# Patient Record
Sex: Male | Born: 1950
Health system: Southern US, Community
[De-identification: ages and names within clinical notes are randomized; demographics above are authoritative.]

## PROBLEM LIST (undated history)

## (undated) DIAGNOSIS — G5600 Carpal tunnel syndrome, unspecified upper limb: Secondary | ICD-10-CM

## (undated) DIAGNOSIS — K449 Diaphragmatic hernia without obstruction or gangrene: Secondary | ICD-10-CM

## (undated) DIAGNOSIS — N390 Urinary tract infection, site not specified: Secondary | ICD-10-CM

## (undated) DIAGNOSIS — I10 Essential (primary) hypertension: Secondary | ICD-10-CM

## (undated) DIAGNOSIS — Z5189 Encounter for other specified aftercare: Secondary | ICD-10-CM

## (undated) DIAGNOSIS — Z466 Encounter for fitting and adjustment of urinary device: Secondary | ICD-10-CM

## (undated) DIAGNOSIS — G839 Paralytic syndrome, unspecified: Secondary | ICD-10-CM

## (undated) DIAGNOSIS — I739 Peripheral vascular disease, unspecified: Secondary | ICD-10-CM

## (undated) DIAGNOSIS — G4730 Sleep apnea, unspecified: Secondary | ICD-10-CM

## (undated) DIAGNOSIS — L8990 Pressure ulcer of unspecified site, unspecified stage: Secondary | ICD-10-CM

## (undated) DIAGNOSIS — IMO0001 Reserved for inherently not codable concepts without codable children: Secondary | ICD-10-CM

## (undated) DIAGNOSIS — R112 Nausea with vomiting, unspecified: Secondary | ICD-10-CM

## (undated) DIAGNOSIS — M1990 Unspecified osteoarthritis, unspecified site: Secondary | ICD-10-CM

## (undated) DIAGNOSIS — K219 Gastro-esophageal reflux disease without esophagitis: Secondary | ICD-10-CM

## (undated) DIAGNOSIS — Z9889 Other specified postprocedural states: Secondary | ICD-10-CM

## (undated) DIAGNOSIS — T300 Burn of unspecified body region, unspecified degree: Secondary | ICD-10-CM

## (undated) DIAGNOSIS — Z87442 Personal history of urinary calculi: Secondary | ICD-10-CM

## (undated) DIAGNOSIS — K5790 Diverticulosis of intestine, part unspecified, without perforation or abscess without bleeding: Secondary | ICD-10-CM

## (undated) DIAGNOSIS — E785 Hyperlipidemia, unspecified: Secondary | ICD-10-CM

## (undated) DIAGNOSIS — K222 Esophageal obstruction: Secondary | ICD-10-CM

## (undated) DIAGNOSIS — G5603 Carpal tunnel syndrome, bilateral upper limbs: Secondary | ICD-10-CM

## (undated) DIAGNOSIS — I2699 Other pulmonary embolism without acute cor pulmonale: Secondary | ICD-10-CM

## (undated) DIAGNOSIS — F17200 Nicotine dependence, unspecified, uncomplicated: Secondary | ICD-10-CM

## (undated) HISTORY — DX: Paralytic syndrome, unspecified: G83.9

## (undated) HISTORY — DX: Pressure ulcer of unspecified site, unspecified stage: L89.90

## (undated) HISTORY — DX: Nicotine dependence, unspecified, uncomplicated: F17.200

## (undated) HISTORY — DX: Diaphragmatic hernia without obstruction or gangrene: K44.9

## (undated) HISTORY — DX: Esophageal obstruction: K22.2

## (undated) HISTORY — DX: Gastro-esophageal reflux disease without esophagitis: K21.9

## (undated) HISTORY — DX: Burn of unspecified body region, unspecified degree: T30.0

## (undated) HISTORY — DX: Hyperlipidemia, unspecified: E78.5

## (undated) HISTORY — DX: Peripheral vascular disease, unspecified: I73.9

## (undated) HISTORY — DX: Diverticulosis of intestine, part unspecified, without perforation or abscess without bleeding: K57.90

## (undated) HISTORY — DX: Carpal tunnel syndrome, unspecified upper limb: G56.00

## (undated) HISTORY — PX: MULTIPLE TOOTH EXTRACTIONS: SHX2053

## (undated) HISTORY — DX: Essential (primary) hypertension: I10

## (undated) HISTORY — PX: OTHER SURGICAL HISTORY: SHX169

## (undated) SURGERY — EGD (ESOPHAGOGASTRODUODENOSCOPY)
Anesthesia: Moderate Sedation

---
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
## 1998-04-25 ENCOUNTER — Encounter: Admission: RE | Admit: 1998-04-25 | Discharge: 1998-05-10 | Payer: Self-pay | Admitting: Family Medicine

## 1998-05-11 ENCOUNTER — Ambulatory Visit (HOSPITAL_COMMUNITY): Admission: RE | Admit: 1998-05-11 | Discharge: 1998-05-11 | Payer: Self-pay | Admitting: Orthopedic Surgery

## 2001-02-01 ENCOUNTER — Encounter: Payer: Self-pay | Admitting: Surgery

## 2001-02-01 ENCOUNTER — Encounter: Admission: RE | Admit: 2001-02-01 | Discharge: 2001-02-01 | Payer: Self-pay | Admitting: Surgery

## 2001-02-03 ENCOUNTER — Ambulatory Visit (HOSPITAL_BASED_OUTPATIENT_CLINIC_OR_DEPARTMENT_OTHER): Admission: RE | Admit: 2001-02-03 | Discharge: 2001-02-04 | Payer: Self-pay | Admitting: Surgery

## 2001-02-03 ENCOUNTER — Encounter (INDEPENDENT_AMBULATORY_CARE_PROVIDER_SITE_OTHER): Payer: Self-pay | Admitting: Specialist

## 2001-02-03 HISTORY — PX: HERNIA REPAIR: SHX51

## 2002-05-31 ENCOUNTER — Encounter: Admission: RE | Admit: 2002-05-31 | Discharge: 2002-05-31 | Payer: Self-pay | Admitting: Family Medicine

## 2002-05-31 ENCOUNTER — Encounter: Payer: Self-pay | Admitting: Family Medicine

## 2003-02-18 HISTORY — PX: OTHER SURGICAL HISTORY: SHX169

## 2003-05-13 ENCOUNTER — Inpatient Hospital Stay (HOSPITAL_COMMUNITY): Admission: EM | Admit: 2003-05-13 | Discharge: 2003-05-18 | Payer: Self-pay | Admitting: Emergency Medicine

## 2005-01-01 ENCOUNTER — Encounter (HOSPITAL_BASED_OUTPATIENT_CLINIC_OR_DEPARTMENT_OTHER): Admission: RE | Admit: 2005-01-01 | Discharge: 2005-04-01 | Payer: Self-pay | Admitting: Surgery

## 2005-04-02 ENCOUNTER — Encounter (HOSPITAL_BASED_OUTPATIENT_CLINIC_OR_DEPARTMENT_OTHER): Admission: RE | Admit: 2005-04-02 | Discharge: 2005-04-17 | Payer: Self-pay | Admitting: Surgery

## 2006-02-17 DIAGNOSIS — K5790 Diverticulosis of intestine, part unspecified, without perforation or abscess without bleeding: Secondary | ICD-10-CM

## 2006-02-17 HISTORY — PX: OTHER SURGICAL HISTORY: SHX169

## 2006-02-17 HISTORY — DX: Diverticulosis of intestine, part unspecified, without perforation or abscess without bleeding: K57.90

## 2006-02-20 ENCOUNTER — Ambulatory Visit: Payer: Self-pay | Admitting: Internal Medicine

## 2006-02-27 ENCOUNTER — Ambulatory Visit (HOSPITAL_COMMUNITY): Admission: RE | Admit: 2006-02-27 | Discharge: 2006-02-27 | Payer: Self-pay | Admitting: Internal Medicine

## 2006-02-27 ENCOUNTER — Ambulatory Visit: Payer: Self-pay | Admitting: Internal Medicine

## 2008-04-28 ENCOUNTER — Emergency Department (HOSPITAL_COMMUNITY): Admission: EM | Admit: 2008-04-28 | Discharge: 2008-04-29 | Payer: Self-pay | Admitting: Emergency Medicine

## 2008-04-30 ENCOUNTER — Emergency Department (HOSPITAL_COMMUNITY): Admission: EM | Admit: 2008-04-30 | Discharge: 2008-04-30 | Payer: Self-pay | Admitting: Emergency Medicine

## 2008-05-02 ENCOUNTER — Encounter (HOSPITAL_COMMUNITY): Admission: RE | Admit: 2008-05-02 | Discharge: 2008-06-01 | Payer: Self-pay | Admitting: Emergency Medicine

## 2008-06-06 ENCOUNTER — Encounter (HOSPITAL_COMMUNITY): Admission: RE | Admit: 2008-06-06 | Discharge: 2008-07-11 | Payer: Self-pay | Admitting: Emergency Medicine

## 2008-07-14 ENCOUNTER — Encounter (HOSPITAL_COMMUNITY): Admission: RE | Admit: 2008-07-14 | Discharge: 2008-08-16 | Payer: Self-pay | Admitting: Family Medicine

## 2008-08-17 ENCOUNTER — Encounter (HOSPITAL_COMMUNITY): Admission: RE | Admit: 2008-08-17 | Discharge: 2008-09-16 | Payer: Self-pay | Admitting: Family Medicine

## 2008-09-20 ENCOUNTER — Encounter (HOSPITAL_COMMUNITY): Admission: RE | Admit: 2008-09-20 | Discharge: 2008-10-20 | Payer: Self-pay | Admitting: Family Medicine

## 2009-01-06 ENCOUNTER — Emergency Department (HOSPITAL_COMMUNITY): Admission: EM | Admit: 2009-01-06 | Discharge: 2009-01-06 | Payer: Self-pay | Admitting: Emergency Medicine

## 2009-01-07 ENCOUNTER — Emergency Department (HOSPITAL_COMMUNITY): Admission: EM | Admit: 2009-01-07 | Discharge: 2009-01-07 | Payer: Self-pay | Admitting: Emergency Medicine

## 2009-01-10 ENCOUNTER — Emergency Department (HOSPITAL_COMMUNITY): Admission: EM | Admit: 2009-01-10 | Discharge: 2009-01-10 | Payer: Self-pay | Admitting: Emergency Medicine

## 2009-01-11 ENCOUNTER — Emergency Department (HOSPITAL_COMMUNITY): Admission: EM | Admit: 2009-01-11 | Discharge: 2009-01-11 | Payer: Self-pay | Admitting: Emergency Medicine

## 2009-01-17 ENCOUNTER — Encounter (HOSPITAL_COMMUNITY): Admission: RE | Admit: 2009-01-17 | Discharge: 2009-02-14 | Payer: Self-pay | Admitting: Family Medicine

## 2009-01-19 ENCOUNTER — Emergency Department (HOSPITAL_COMMUNITY): Admission: EM | Admit: 2009-01-19 | Discharge: 2009-01-19 | Payer: Self-pay | Admitting: Emergency Medicine

## 2009-04-09 DIAGNOSIS — Z8719 Personal history of other diseases of the digestive system: Secondary | ICD-10-CM | POA: Insufficient documentation

## 2009-04-09 DIAGNOSIS — I10 Essential (primary) hypertension: Secondary | ICD-10-CM | POA: Insufficient documentation

## 2009-04-09 DIAGNOSIS — A4901 Methicillin susceptible Staphylococcus aureus infection, unspecified site: Secondary | ICD-10-CM | POA: Insufficient documentation

## 2009-04-09 DIAGNOSIS — K219 Gastro-esophageal reflux disease without esophagitis: Secondary | ICD-10-CM | POA: Insufficient documentation

## 2009-04-09 DIAGNOSIS — E785 Hyperlipidemia, unspecified: Secondary | ICD-10-CM | POA: Insufficient documentation

## 2009-04-12 ENCOUNTER — Ambulatory Visit: Payer: Self-pay | Admitting: Internal Medicine

## 2009-04-12 DIAGNOSIS — R1310 Dysphagia, unspecified: Secondary | ICD-10-CM | POA: Insufficient documentation

## 2009-04-12 DIAGNOSIS — R112 Nausea with vomiting, unspecified: Secondary | ICD-10-CM | POA: Insufficient documentation

## 2009-04-18 ENCOUNTER — Ambulatory Visit (HOSPITAL_COMMUNITY): Admission: RE | Admit: 2009-04-18 | Discharge: 2009-04-18 | Payer: Self-pay | Admitting: Internal Medicine

## 2009-04-18 ENCOUNTER — Ambulatory Visit: Payer: Self-pay | Admitting: Internal Medicine

## 2009-11-20 ENCOUNTER — Encounter: Payer: Self-pay | Admitting: Internal Medicine

## 2009-11-20 ENCOUNTER — Ambulatory Visit: Payer: Self-pay | Admitting: Gastroenterology

## 2009-11-22 ENCOUNTER — Encounter: Payer: Self-pay | Admitting: Internal Medicine

## 2009-11-26 ENCOUNTER — Ambulatory Visit (HOSPITAL_COMMUNITY): Admission: RE | Admit: 2009-11-26 | Discharge: 2009-11-26 | Payer: Self-pay | Admitting: Internal Medicine

## 2009-11-29 ENCOUNTER — Encounter (INDEPENDENT_AMBULATORY_CARE_PROVIDER_SITE_OTHER): Payer: Self-pay

## 2009-11-30 ENCOUNTER — Encounter: Payer: Self-pay | Admitting: Internal Medicine

## 2009-12-18 ENCOUNTER — Ambulatory Visit (HOSPITAL_COMMUNITY): Admission: RE | Admit: 2009-12-18 | Discharge: 2009-12-18 | Payer: Self-pay | Admitting: Internal Medicine

## 2009-12-18 ENCOUNTER — Ambulatory Visit: Payer: Self-pay | Admitting: Internal Medicine

## 2009-12-18 DIAGNOSIS — K222 Esophageal obstruction: Secondary | ICD-10-CM

## 2009-12-18 HISTORY — DX: Esophageal obstruction: K22.2
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
## 2010-03-19 NOTE — Letter (Signed)
Summary: EGD/ED ORDER  EGD/ED ORDER   Imported By: Ave Filter 11/30/2009 08:47:19  _____________________________________________________________________  External Attachment:    Type:   Image     Comment:   External Document
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
## 2010-03-19 NOTE — Letter (Signed)
Summary: Plan of Care, Need to Discuss  [facility]  [address]   [place], [place] [address]   Phone: [phone]  Fax: [phone]    November 29, 2009  TAWFIQ FAVILA [address] [place], [place]  [address] [birth year]   Dear Mr. Daphine Deutscher,   We are writing this letter to inform you of treatment plans and/or discuss your plan of care.  We have tried several times to contact you; however, we have yet to reach you.  We ask that you please contact our office for follow-up on your gastrointestinal issues.  We can  be reached at [phone] to schedule an appointment, or to speak with someone regarding your health care needs.  Please do not neglect your health.   Sincerely,    Hendricks Limes LPN  [facility] Ph: [phone]    Fax: [phone]

## 2010-03-19 NOTE — Letter (Signed)
Summary: EGD ED ORDER  EGD ED ORDER   Imported By: Ave Filter 04/12/2009 15:21:17  _____________________________________________________________________  External Attachment:    Type:   Image     Comment:   External Document

## 2010-03-22 NOTE — Letter (Signed)
Summary: [facility] ORDER  [facility] ORDER   Imported By: Ave Filter 11/20/2009 10:43:23  _____________________________________________________________________  External Attachment:    Type:   Image     Comment:   External Document

## 2010-03-22 NOTE — Letter (Signed)
Summary: REFERRAL FROM [facility]  REFERRAL FROM [facility]   Imported By: Rexene Alberts 11/22/2009 15:58:19  _____________________________________________________________________  External Attachment:    Type:   Image     Comment:   External Document

## 2010-05-21 LAB — CBC
Platelets: 278 10*3/uL (ref 150–400)
RDW: 14.8 % (ref 11.5–15.5)

## 2010-05-21 LAB — URINE MICROSCOPIC-ADD ON

## 2010-05-21 LAB — COMPREHENSIVE METABOLIC PANEL
ALT: 16 U/L (ref 0–53)
Albumin: 4 g/dL (ref 3.5–5.2)
Alkaline Phosphatase: 66 U/L (ref 39–117)
GFR calc Af Amer: >60 mL/min (ref >60)
Potassium: 3.6 mEq/L (ref 3.5–5.1)
Sodium: 141 mEq/L (ref 135–145)
Total Protein: 7.4 g/dL (ref 6.0–8.3)

## 2010-05-21 LAB — DIFFERENTIAL
Basophils Relative: 1 % (ref 0–1)
Eosinophils Absolute: 0.1 10*3/uL (ref 0.0–0.7)
Monocytes Absolute: 0.4 10*3/uL (ref 0.1–1.0)
Monocytes Relative: 7 % (ref 3–12)
Neutro Abs: 4.3 10*3/uL (ref 1.7–7.7)

## 2010-05-21 LAB — URINALYSIS, ROUTINE W REFLEX MICROSCOPIC
Glucose, UA: NEGATIVE mg/dL
Hgb urine dipstick: NEGATIVE
Protein, ur: NEGATIVE mg/dL

## 2010-05-21 LAB — URINE CULTURE

## 2010-05-22 LAB — DIFFERENTIAL
Basophils Relative: 0 % (ref 0–1)
Eosinophils Absolute: 0 10*3/uL (ref 0.0–0.7)
Eosinophils Absolute: 0.2 10*3/uL (ref 0.0–0.7)
Eosinophils Relative: 1 % (ref 0–5)
Eosinophils Relative: 3 % (ref 0–5)
Lymphocytes Relative: 35 % (ref 12–46)
Lymphs Abs: 2.2 10*3/uL (ref 0.7–4.0)
Monocytes Absolute: 0.6 10*3/uL (ref 0.1–1.0)
Monocytes Relative: 9 % (ref 3–12)
Neutrophils Relative %: 66 % (ref 43–77)

## 2010-05-22 LAB — URINE MICROSCOPIC-ADD ON

## 2010-05-22 LAB — BASIC METABOLIC PANEL
BUN: 9 mg/dL (ref 6–23)
BUN: 7 mg/dL (ref 6–23)
CO2: 26 mEq/L (ref 19–32)
Chloride: 101 mEq/L (ref 96–112)
Chloride: 101 mEq/L (ref 96–112)
Creatinine, Ser: 0.56 mg/dL (ref 0.4–1.5)
GFR calc non Af Amer: >60 mL/min (ref >60)
Glucose, Bld: 123 mg/dL — ABNORMAL HIGH (ref 70–99)
Potassium: 3.6 mEq/L (ref 3.5–5.1)
Sodium: 136 mEq/L (ref 135–145)

## 2010-05-22 LAB — URINALYSIS, ROUTINE W REFLEX MICROSCOPIC
Bilirubin Urine: NEGATIVE
Glucose, UA: NEGATIVE mg/dL
Ketones, ur: NEGATIVE mg/dL
Ketones, ur: NEGATIVE mg/dL
Leukocytes, UA: NEGATIVE
Protein, ur: NEGATIVE mg/dL
Protein, ur: NEGATIVE mg/dL
Urobilinogen, UA: 1 mg/dL (ref 0.0–1.0)
pH: 8.5 — ABNORMAL HIGH (ref 5.0–8.0)

## 2010-05-22 LAB — CBC
HCT: 38.2 % — ABNORMAL LOW (ref 39.0–52.0)
Hemoglobin: 13.3 g/dL (ref 13.0–17.0)
MCHC: 34 g/dL (ref 30.0–36.0)
MCV: 89.9 fL (ref 78.0–100.0)
MCV: 89.2 fL (ref 78.0–100.0)
Platelets: 204 10*3/uL (ref 150–400)
Platelets: 219 10*3/uL (ref 150–400)
RBC: 4.29 MIL/uL (ref 4.22–5.81)
RDW: 14.9 % (ref 11.5–15.5)
WBC: 6.3 10*3/uL (ref 4.0–10.5)

## 2010-05-22 LAB — URINE CULTURE
Colony Count: 75000
Colony Count: 50000

## 2010-06-03 ENCOUNTER — Other Ambulatory Visit: Payer: Self-pay

## 2010-06-03 DIAGNOSIS — K219 Gastro-esophageal reflux disease without esophagitis: Secondary | ICD-10-CM

## 2010-06-03 NOTE — Telephone Encounter (Signed)
Doris, Last note says pt is on omeprazole 20mg  BID, not ZEGERID as above. Please verify. Thanks

## 2010-06-04 MED ORDER — OMEPRAZOLE-SODIUM BICARBONATE 40-1100 MG PO CAPS: 1.0000 | ORAL_CAPSULE | Freq: Every day | ORAL | Status: AC

## 2010-06-04 NOTE — Telephone Encounter (Signed)
Per [facility] LPN., Patient is taking Zegerid 40 mg daily.

## 2010-06-20 ENCOUNTER — Encounter: Payer: Self-pay | Admitting: Internal Medicine

## 2010-06-27 ENCOUNTER — Encounter: Payer: Self-pay | Admitting: Urgent Care

## 2010-06-27 ENCOUNTER — Ambulatory Visit (INDEPENDENT_AMBULATORY_CARE_PROVIDER_SITE_OTHER): Payer: Medicare Other | Admitting: Urgent Care

## 2010-06-27 VITALS — BP 149/74 | HR 91 | Temp 98.7°F | Ht 69.0 in

## 2010-06-27 DIAGNOSIS — K222 Esophageal obstruction: Secondary | ICD-10-CM

## 2010-06-27 DIAGNOSIS — K219 Gastro-esophageal reflux disease without esophagitis: Secondary | ICD-10-CM

## 2010-06-27 MED ORDER — OMEPRAZOLE 20 MG PO CPDR: 20.0000 mg | DELAYED_RELEASE_CAPSULE | Freq: Two times a day (BID) | ORAL | Status: DC

## 2010-06-27 NOTE — Patient Instructions (Signed)
Begin omeprazole 20mg  twice per day

## 2010-06-27 NOTE — Progress Notes (Signed)
Cc to Dr. Tanya Nones
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
## 2010-06-27 NOTE — Assessment & Plan Note (Signed)
Doing well. Dysphagia resolved.

## 2010-06-27 NOTE — Assessment & Plan Note (Signed)
Complicated GERD w/ peptic stricture.  Requires BID Omeprazole 20mg .  Doing well.
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
## 2011-06-05 ENCOUNTER — Encounter (INDEPENDENT_AMBULATORY_CARE_PROVIDER_SITE_OTHER): Payer: Self-pay | Admitting: Surgery

## 2011-06-26 ENCOUNTER — Encounter (INDEPENDENT_AMBULATORY_CARE_PROVIDER_SITE_OTHER): Payer: Self-pay | Admitting: Surgery

## 2011-06-26 ENCOUNTER — Ambulatory Visit (INDEPENDENT_AMBULATORY_CARE_PROVIDER_SITE_OTHER): Payer: Medicare Other | Admitting: Surgery

## 2011-06-26 VITALS — BP 136/70 | HR 78 | Resp 16

## 2011-06-26 DIAGNOSIS — D1720 Benign lipomatous neoplasm of skin and subcutaneous tissue of unspecified limb: Secondary | ICD-10-CM

## 2011-06-26 DIAGNOSIS — D1739 Benign lipomatous neoplasm of skin and subcutaneous tissue of other sites: Secondary | ICD-10-CM
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
## 2011-06-26 NOTE — Patient Instructions (Signed)
We will schedule your surgery today.  Recommended hand surgeons - Dr. Onalee Hua [phone] Dr. Bradly Bienenstock Dr. Betha Loa

## 2011-06-27 ENCOUNTER — Encounter (HOSPITAL_COMMUNITY): Payer: Self-pay | Admitting: Pharmacy Technician

## 2011-07-03 ENCOUNTER — Encounter (HOSPITAL_COMMUNITY): Payer: Self-pay

## 2011-07-03 ENCOUNTER — Ambulatory Visit (HOSPITAL_COMMUNITY)
Admission: RE | Admit: 2011-07-03 | Discharge: 2011-07-03 | Disposition: A | Discharge status: Home / Self Care | Payer: Medicare Other | Source: Ambulatory Visit | Attending: Surgery | Admitting: Surgery

## 2011-07-03 ENCOUNTER — Encounter (HOSPITAL_COMMUNITY)
Admission: RE | Admit: 2011-07-03 | Discharge: 2011-07-03 | Disposition: A | Discharge status: Home / Self Care | Payer: Medicare Other | Source: Ambulatory Visit | Attending: Surgery | Admitting: Surgery

## 2011-07-03 DIAGNOSIS — Z01810 Encounter for preprocedural cardiovascular examination: Secondary | ICD-10-CM | POA: Insufficient documentation

## 2011-07-03 DIAGNOSIS — M5380 Other specified dorsopathies, site unspecified: Secondary | ICD-10-CM | POA: Insufficient documentation

## 2011-07-03 DIAGNOSIS — Z01818 Encounter for other preprocedural examination: Secondary | ICD-10-CM | POA: Insufficient documentation

## 2011-07-03 DIAGNOSIS — Z01812 Encounter for preprocedural laboratory examination: Secondary | ICD-10-CM | POA: Insufficient documentation

## 2011-07-03 DIAGNOSIS — D179 Benign lipomatous neoplasm, unspecified: Secondary | ICD-10-CM | POA: Insufficient documentation

## 2011-07-03 HISTORY — DX: Other pulmonary embolism without acute cor pulmonale: I26.99

## 2011-07-03 HISTORY — DX: Carpal tunnel syndrome, bilateral upper limbs: G56.03

## 2011-07-03 HISTORY — DX: Encounter for other specified aftercare: Z51.89

## 2011-07-03 HISTORY — DX: Other specified postprocedural states: Z98.890

## 2011-07-03 HISTORY — DX: Encounter for fitting and adjustment of urinary device: Z46.6

## 2011-07-03 HISTORY — DX: Sleep apnea, unspecified: G47.30

## 2011-07-03 HISTORY — DX: Nausea with vomiting, unspecified: R11.2

## 2011-07-03 HISTORY — DX: Urinary tract infection, site not specified: N39.0

## 2011-07-03 HISTORY — DX: Reserved for inherently not codable concepts without codable children: IMO0001

## 2011-07-03 LAB — BASIC METABOLIC PANEL
CO2: 29 mEq/L (ref 19–32)
Calcium: 9.3 mg/dL (ref 8.4–10.5)
Creatinine, Ser: 0.61 mg/dL (ref 0.50–1.35)
GFR calc Af Amer: >90 mL/min (ref >90)

## 2011-07-03 LAB — CBC
MCH: 30.2 pg (ref 26.0–34.0)
MCV: 88.8 fL (ref 78.0–100.0)
Platelets: 278 10*3/uL (ref 150–400)
RDW: 13.5 % (ref 11.5–15.5)

## 2011-07-03 LAB — SURGICAL PCR SCREEN
MRSA, PCR: NEGATIVE
Staphylococcus aureus: NEGATIVE

## 2011-07-03 NOTE — Patient Instructions (Signed)
YOUR SURGERY IS SCHEDULED ON:  Monday  5/20  AT  9:19 AM  REPORT TO [facility] AT:  6:45 AM      PHONE # FOR SHORT STAY IS [phone]  DO NOT EAT OR DRINK ANYTHING AFTER MIDNIGHT THE NIGHT BEFORE YOUR SURGERY.  YOU MAY BRUSH YOUR TEETH, RINSE OUT YOUR MOUTH--BUT NO WATER, NO FOOD, NO CHEWING GUM, NO MINTS, NO CANDIES, NO CHEWING TOBACCO.  PLEASE TAKE THE FOLLOWING MEDICATIONS THE AM OF YOUR SURGERY WITH A FEW SIPS OF WATER: OMEPRAZOLE    IF YOU USE INHALERS--USE YOUR INHALERS THE AM OF YOUR SURGERY AND BRING INHALERS TO THE HOSPITAL -TAKE TO SURGERY.    IF YOU ARE DIABETIC:  DO NOT TAKE ANY DIABETIC MEDICATIONS THE AM OF YOUR SURGERY.  IF YOU TAKE INSULIN IN THE EVENINGS--PLEASE ONLY TAKE 1/2 NORMAL EVENING DOSE THE NIGHT BEFORE YOUR SURGERY.  NO INSULIN THE AM OF YOUR SURGERY.  IF YOU HAVE SLEEP APNEA AND USE CPAP OR BIPAP--PLEASE BRING THE MASK --NOT THE MACHINE-NOT THE TUBING   -JUST THE MASK. DO NOT BRING VALUABLES, MONEY, CREDIT CARDS.  CONTACT LENS, DENTURES / PARTIALS, GLASSES SHOULD NOT BE WORN TO SURGERY AND IN MOST CASES-HEARING AIDS WILL NEED TO BE REMOVED.  BRING YOUR GLASSES CASE, ANY EQUIPMENT NEEDED FOR YOUR CONTACT LENS. FOR PATIENTS ADMITTED TO THE HOSPITAL--CHECK OUT TIME THE DAY OF DISCHARGE IS 11:00 AM.  ALL INPATIENT ROOMS ARE PRIVATE - WITH BATHROOM, TELEPHONE, TELEVISION AND WIFI INTERNET. IF YOU ARE BEING DISCHARGED THE SAME DAY OF YOUR SURGERY--YOU CAN NOT DRIVE YOURSELF HOME--AND SHOULD NOT GO HOME ALONE BY TAXI OR BUS.  NO DRIVING OR OPERATING MACHINERY FOR 24 HOURS FOLLOWING ANESTHESIA / PAIN MEDICATIONS.                            SPECIAL INSTRUCTIONS:  CHLORHEXIDINE SOAP SHOWER (other brand names are Betasept and Hibiclens ) PLEASE SHOWER WITH CHLORHEXIDINE THE NIGHT BEFORE YOUR SURGERY AND THE AM OF YOUR SURGERY. DO NOT USE CHLORHEXIDINE ON YOUR FACE OR PRIVATE AREAS--YOU MAY USE YOUR NORMAL SOAP THOSE AREAS AND YOUR NORMAL SHAMPOO.  WOMEN  SHOULD AVOID SHAVING UNDER ARMS AND SHAVING LEGS 48 HOURS BEFORE USING CHLORHEXIDINE TO AVOID SKIN IRRITATION.  DO NOT USE IF ALLERGIC TO CHLORHEXIDINE.  PLEASE READ OVER ANY  FACT SHEETS THAT YOU WERE GIVEN: MRSA INFORMATION

## 2011-07-03 NOTE — Pre-Procedure Instructions (Signed)
CBC, BMET, EKG, CXR WERE DONE TODAY PREOP AT [facility]. 

## 2011-07-07 ENCOUNTER — Telehealth (INDEPENDENT_AMBULATORY_CARE_PROVIDER_SITE_OTHER): Payer: Self-pay | Admitting: General Surgery

## 2011-07-07 ENCOUNTER — Ambulatory Visit (HOSPITAL_COMMUNITY): Payer: Medicare Other | Admitting: Anesthesiology

## 2011-07-07 ENCOUNTER — Encounter (HOSPITAL_COMMUNITY): Payer: Self-pay | Admitting: *Deleted

## 2011-07-07 ENCOUNTER — Ambulatory Visit (HOSPITAL_COMMUNITY)
Admission: RE | Admit: 2011-07-07 | Discharge: 2011-07-07 | Disposition: A | Discharge status: Home / Self Care | Payer: Medicare Other | Source: Ambulatory Visit | Attending: Surgery | Admitting: Surgery

## 2011-07-07 ENCOUNTER — Encounter (HOSPITAL_COMMUNITY)
Admission: RE | Disposition: A | Discharge status: Home / Self Care | Payer: Self-pay | Source: Ambulatory Visit | Attending: Surgery

## 2011-07-07 ENCOUNTER — Encounter (HOSPITAL_COMMUNITY): Payer: Self-pay | Admitting: Anesthesiology

## 2011-07-07 DIAGNOSIS — K219 Gastro-esophageal reflux disease without esophagitis: Secondary | ICD-10-CM | POA: Insufficient documentation

## 2011-07-07 DIAGNOSIS — Z79899 Other long term (current) drug therapy: Secondary | ICD-10-CM | POA: Insufficient documentation

## 2011-07-07 DIAGNOSIS — D1779 Benign lipomatous neoplasm of other sites: Secondary | ICD-10-CM | POA: Insufficient documentation

## 2011-07-07 DIAGNOSIS — D1739 Benign lipomatous neoplasm of skin and subcutaneous tissue of other sites: Secondary | ICD-10-CM

## 2011-07-07 DIAGNOSIS — D1720 Benign lipomatous neoplasm of skin and subcutaneous tissue of unspecified limb: Secondary | ICD-10-CM

## 2011-07-07 DIAGNOSIS — I10 Essential (primary) hypertension: Secondary | ICD-10-CM | POA: Insufficient documentation

## 2011-07-07 DIAGNOSIS — E785 Hyperlipidemia, unspecified: Secondary | ICD-10-CM | POA: Insufficient documentation

## 2011-07-07 DIAGNOSIS — K449 Diaphragmatic hernia without obstruction or gangrene: Secondary | ICD-10-CM | POA: Insufficient documentation

## 2011-07-07 HISTORY — PX: LIPOMA EXCISION: SHX5283

## 2011-07-07 SURGERY — EXCISION LIPOMA
Anesthesia: General | Site: Axilla | Laterality: Right | Wound class: Clean

## 2011-07-07 MED ORDER — MORPHINE SULFATE 10 MG/ML IJ SOLN: 2.0000 mg | INTRAMUSCULAR | Status: DC | PRN

## 2011-07-07 MED ORDER — FENTANYL CITRATE 0.05 MG/ML IJ SOLN: 25.0000 ug | INTRAMUSCULAR | Status: DC | PRN

## 2011-07-07 MED ORDER — BUPIVACAINE-EPINEPHRINE 0.25% -1:200000 IJ SOLN
INTRAMUSCULAR | Status: DC | PRN
  Administered 2011-07-07: 10 mL

## 2011-07-07 MED ORDER — CEFAZOLIN SODIUM 1-5 GM-% IV SOLN
1.0000 g | INTRAVENOUS | Status: AC
  Administered 2011-07-07: 1 g via INTRAVENOUS

## 2011-07-07 MED ORDER — LIDOCAINE HCL (CARDIAC) 20 MG/ML IV SOLN
INTRAVENOUS | Status: DC | PRN
  Administered 2011-07-07: 50 mg via INTRAVENOUS

## 2011-07-07 MED ORDER — ACETAMINOPHEN 10 MG/ML IV SOLN
INTRAVENOUS | Status: AC
  Filled 2011-07-07: qty 100

## 2011-07-07 MED ORDER — BUPIVACAINE-EPINEPHRINE PF 0.25-1:200000 % IJ SOLN
INTRAMUSCULAR | Status: AC
  Filled 2011-07-07: qty 30

## 2011-07-07 MED ORDER — PROMETHAZINE HCL 25 MG/ML IJ SOLN: 6.2500 mg | INTRAMUSCULAR | Status: DC | PRN

## 2011-07-07 MED ORDER — PROPOFOL 10 MG/ML IV BOLUS
INTRAVENOUS | Status: DC | PRN
  Administered 2011-07-07: 200 mg via INTRAVENOUS

## 2011-07-07 MED ORDER — ONDANSETRON HCL 4 MG/2ML IJ SOLN: 4.0000 mg | INTRAMUSCULAR | Status: DC | PRN

## 2011-07-07 MED ORDER — MIDAZOLAM HCL 5 MG/5ML IJ SOLN
INTRAMUSCULAR | Status: DC | PRN
  Administered 2011-07-07: 2 mg via INTRAVENOUS

## 2011-07-07 MED ORDER — OXYCODONE-ACETAMINOPHEN 5-325 MG PO TABS: 1.0000 | ORAL_TABLET | ORAL | Status: AC | PRN

## 2011-07-07 MED ORDER — LACTATED RINGERS IV SOLN
INTRAVENOUS | Status: DC
  Administered 2011-07-07: 1000 mL via INTRAVENOUS

## 2011-07-07 MED ORDER — 0.9 % SODIUM CHLORIDE (POUR BTL) OPTIME
TOPICAL | Status: DC | PRN
  Administered 2011-07-07: 1000 mL

## 2011-07-07 MED ORDER — LACTATED RINGERS IV SOLN
INTRAVENOUS | Status: DC | PRN
  Administered 2011-07-07: 09:00:00 via INTRAVENOUS

## 2011-07-07 MED ORDER — DEXAMETHASONE SODIUM PHOSPHATE 10 MG/ML IJ SOLN
INTRAMUSCULAR | Status: DC | PRN
  Administered 2011-07-07: 6 mg via INTRAVENOUS

## 2011-07-07 MED ORDER — OXYCODONE-ACETAMINOPHEN 5-325 MG PO TABS: 1.0000 | ORAL_TABLET | ORAL | Status: DC | PRN

## 2011-07-07 MED ORDER — LACTATED RINGERS IV SOLN: INTRAVENOUS | Status: DC

## 2011-07-07 MED ORDER — KETOROLAC TROMETHAMINE 30 MG/ML IJ SOLN
INTRAMUSCULAR | Status: DC | PRN
  Administered 2011-07-07: 30 mg via INTRAVENOUS

## 2011-07-07 MED ORDER — ONDANSETRON HCL 4 MG/2ML IJ SOLN
INTRAMUSCULAR | Status: DC | PRN
  Administered 2011-07-07: 4 mg via INTRAVENOUS

## 2011-07-07 MED ORDER — CEFAZOLIN SODIUM 1-5 GM-% IV SOLN
INTRAVENOUS | Status: AC
  Filled 2011-07-07: qty 50

## 2011-07-07 MED ORDER — FENTANYL CITRATE 0.05 MG/ML IJ SOLN
INTRAMUSCULAR | Status: DC | PRN
  Administered 2011-07-07 (×2): 50 ug via INTRAVENOUS

## 2011-07-07 SURGICAL SUPPLY — 46 items
APPLIER CLIP 11 MED OPEN (CLIP) ×2
BENZOIN TINCTURE PRP APPL 2/3 (GAUZE/BANDAGES/DRESSINGS) ×2 IMPLANT
BLADE HEX COATED 2.75 (ELECTRODE) ×2 IMPLANT
BLADE SURG SZ10 CARB STEEL (BLADE) ×2 IMPLANT
CANISTER SUCTION 2500CC (MISCELLANEOUS) ×2 IMPLANT
CHLORAPREP W/TINT 26ML (MISCELLANEOUS) ×2 IMPLANT
CLIP APPLIE 11 MED OPEN (CLIP) ×1 IMPLANT
CLOTH BEACON ORANGE TIMEOUT ST (SAFETY) ×2 IMPLANT
COVER SURGICAL LIGHT HANDLE (MISCELLANEOUS) ×2 IMPLANT
DECANTER SPIKE VIAL GLASS SM (MISCELLANEOUS) IMPLANT
DRAIN CHANNEL RND F F (WOUND CARE) ×2 IMPLANT
DRAPE LAPAROTOMY T 102X78X121 (DRAPES) ×2 IMPLANT
DRAPE LAPAROTOMY TRNSV 102X78 (DRAPE) IMPLANT
DRAPE LG THREE QUARTER DISP (DRAPES) IMPLANT
DRAPE UTILITY XL STRL (DRAPES) ×2 IMPLANT
ELECT REM PT RETURN 9FT ADLT (ELECTROSURGICAL) ×2
ELECTRODE REM PT RTRN 9FT ADLT (ELECTROSURGICAL) ×1 IMPLANT
EVACUATOR DRAINAGE 10X20 100CC (DRAIN) ×1 IMPLANT
EVACUATOR SILICONE 100CC (DRAIN) ×1
GLOVE BIO SURGEON STRL SZ7 (GLOVE) ×2 IMPLANT
GLOVE BIOGEL PI IND STRL 7.0 (GLOVE) ×1 IMPLANT
GLOVE BIOGEL PI IND STRL 7.5 (GLOVE) ×1 IMPLANT
GLOVE BIOGEL PI INDICATOR 7.0 (GLOVE) ×1
GLOVE BIOGEL PI INDICATOR 7.5 (GLOVE) ×1
GOWN STRL NON-REIN LRG LVL3 (GOWN DISPOSABLE) ×2 IMPLANT
GOWN STRL REIN XL XLG (GOWN DISPOSABLE) ×2 IMPLANT
KIT BASIN OR (CUSTOM PROCEDURE TRAY) ×2 IMPLANT
NEEDLE HYPO 25X1 1.5 SAFETY (NEEDLE) ×2 IMPLANT
NS IRRIG 1000ML POUR BTL (IV SOLUTION) ×2 IMPLANT
PACK BASIC VI WITH GOWN DISP (CUSTOM PROCEDURE TRAY) ×2 IMPLANT
PEN SKIN MARKING BROAD (MISCELLANEOUS) IMPLANT
PENCIL BUTTON HOLSTER BLD 10FT (ELECTRODE) ×2 IMPLANT
SOL PREP POV-IOD 16OZ 10% (MISCELLANEOUS) IMPLANT
SPONGE GAUZE 4X4 12PLY (GAUZE/BANDAGES/DRESSINGS) IMPLANT
SPONGE LAP 18X18 X RAY DECT (DISPOSABLE) ×2 IMPLANT
SPONGE LAP 4X18 X RAY DECT (DISPOSABLE) IMPLANT
STAPLER VISISTAT 35W (STAPLE) IMPLANT
STRIP CLOSURE SKIN 1/2X4 (GAUZE/BANDAGES/DRESSINGS) ×2 IMPLANT
SUT ETHILON 3 0 PS 1 (SUTURE) ×2 IMPLANT
SUT MNCRL AB 4-0 PS2 18 (SUTURE) IMPLANT
SUT VIC AB 3-0 SH 18 (SUTURE) IMPLANT
SYR CONTROL 10ML LL (SYRINGE) ×2 IMPLANT
TAPE CLOTH SURG 4X10 WHT LF (GAUZE/BANDAGES/DRESSINGS) ×2 IMPLANT
TOWEL OR 17X26 10 PK STRL BLUE (TOWEL DISPOSABLE) ×4 IMPLANT
WATER STERILE IRR 1500ML POUR (IV SOLUTION) IMPLANT
YANKAUER SUCT BULB TIP 10FT TU (MISCELLANEOUS) ×2 IMPLANT

## 2011-07-07 NOTE — Interval H&P Note (Signed)
History and Physical Interval Note:  07/07/2011 9:05 AM  Dustin Medina  has presented today for surgery, with the diagnosis of subcutaneous lipoma right axilla 15 cm   The various methods of treatment have been discussed with the patient and family. After consideration of risks, benefits and other options for treatment, the patient has consented to  Procedure(s) (LRB): EXCISION LIPOMA (Right) as a surgical intervention .  The patients' history has been reviewed, patient examined, no change in status, stable for surgery.  I have reviewed the patients' chart and labs.  Questions were answered to the patient's satisfaction.     [name]

## 2011-07-07 NOTE — Telephone Encounter (Signed)
Pt's wife calling to report has gotten home from same day surgery with drainage coming around the drain tube onto the bandage.  She has reinforced the bandage.  Explained how to gently milk the tubing to ensure drainage to the bulb; also the drain was probably secured to go home in a way that prohibited the fluid to flow freely into the bulb.  She will monitor it and call back prn.

## 2011-07-07 NOTE — Discharge Instructions (Signed)
Sponge baths only Record drain output daily Bring the drain output record to the office with you  Call [phone] for any problems.

## 2011-07-07 NOTE — Preoperative (Signed)
Beta Blockers   Reason not to administer Beta Blockers:Not Applicable, not on home BB 
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
## 2011-07-07 NOTE — Anesthesia Procedure Notes (Signed)
Procedure Name: LMA Insertion Date/Time: 07/07/2011 9:39 AM Performed by: Randon Goldsmith CATHERINE PAYNE Pre-anesthesia Checklist: Patient identified, Emergency Drugs available, Suction available and Patient being monitored Patient Re-evaluated:Patient Re-evaluated prior to inductionOxygen Delivery Method: Circle system utilized Preoxygenation: Pre-oxygenation with 100% oxygen Intubation Type: IV induction LMA: LMA with gastric port inserted LMA Size: 5.0 Tube type: Oral Number of attempts: 1 Placement Confirmation: positive ETCO2,  CO2 detector and breath sounds checked- equal and bilateral Tube secured with: Tape Dental Injury: Teeth and Oropharynx as per pre-operative assessment

## 2011-07-07 NOTE — Transfer of Care (Signed)
Immediate Anesthesia Transfer of Care Note  Patient: Dustin Medina  Procedure(s) Performed: Procedure(s) (LRB): EXCISION LIPOMA (Right)  Patient Location: PACU  Anesthesia Type: General  Level of Consciousness: awake, alert  and oriented  Airway & Oxygen Therapy: Patient Spontanous Breathing and Patient connected to face mask oxygen  Post-op Assessment: Report given to PACU RN and Post -op Vital signs reviewed and stable  Post vital signs: Reviewed and stable  Complications: No apparent anesthesia complications

## 2011-07-07 NOTE — Op Note (Signed)
Pre-op diagnosis - large lipoma - right axilla Post-op diagnosis - same Procedure - Excision of large subcutaneous lipoma - right axilla - 20 cm Surgeon - Dalante Minus K. Anesthesia - GETT Indications - This is a 61 yo male who presents with an enlarging mass under his right axilla that has been present for many years.  It has begun to cause discomfort in the right arm.  He presents now for excision  Description of procedure - the patient was brought to the operating room and placed in a supine position on the operating room table.  After an adequate level of anesthesia was obtained, a folded sheet was placed under his right shoulder and his right arm was extended.  We prepped the axilla with chloraprep and draped in sterile fashion.  I made a transverse incision across the mass.  Dissection was carried down through the subcutaneous tissues to the surface of the lipoma.  I bluntly dissected around the main body of the mass, which was quite large.  I delivered the main portion of the mass through the incision.  There was a tail of the mass which extended up under the pectoralis muscle.  I tried to bluntly dissect this free from the surrounding structures.  I could easily visualize the axillary vein as well as the thoracodorsal bundle.  I could not palpate the end of the mass under the pectoralis, but it was much smaller in this area.  I made the decision to amputate the main portion of the mass with cautery.  We removed the mass and sent it for pathologic examination.  The wound was irrigated and inspected for hemostasis.  A 19 French drain was placed through a stab incision and secured with 2-0 ethilon.  The wound was closed with 3-0 vicryl and 4-0 monocryl.  Steri-strips and clean dressing were applied.  The patient was extubated and brought to the recovery room in stable condition.  Dustin Medina. [name], MD, [facility]  07/07/2011 11:05 AM

## 2011-07-07 NOTE — Anesthesia Postprocedure Evaluation (Signed)
Anesthesia Post Note  Patient: Dustin Medina  Procedure(s) Performed: Procedure(s) (LRB): EXCISION LIPOMA (Right)  Anesthesia type: General  Patient location: PACU  Post pain: Pain level controlled  Post assessment: Post-op Vital signs reviewed  Last Vitals:  Filed Vitals:   07/07/11 1115  BP: 121/65  Pulse: 72  Temp:   Resp: 17    Post vital signs: Reviewed  Level of consciousness: sedated  Complications: No apparent anesthesia complications
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
## 2011-07-08 ENCOUNTER — Encounter (HOSPITAL_COMMUNITY): Payer: Self-pay | Admitting: Surgery

## 2011-07-11 ENCOUNTER — Encounter (INDEPENDENT_AMBULATORY_CARE_PROVIDER_SITE_OTHER): Payer: Self-pay | Admitting: Surgery

## 2011-07-11 ENCOUNTER — Ambulatory Visit (INDEPENDENT_AMBULATORY_CARE_PROVIDER_SITE_OTHER): Payer: Medicare Other | Admitting: Surgery

## 2011-07-11 VITALS — BP 116/76 | HR 84 | Temp 97.6°F | Resp 14 | Ht 70.5 in | Wt 178.0 lb

## 2011-07-11 DIAGNOSIS — D1720 Benign lipomatous neoplasm of skin and subcutaneous tissue of unspecified limb: Secondary | ICD-10-CM

## 2011-07-11 DIAGNOSIS — D1739 Benign lipomatous neoplasm of skin and subcutaneous tissue of other sites: Secondary | ICD-10-CM

## 2011-07-11 NOTE — Progress Notes (Signed)
The patient had a 15 cm lipoma removed from his right axilla extending up behind the pectoralis to the subclavian vessels on 5/20.  We left a drain in place.  He has had minimal drainage over the last couple of days.  Minimal pain.  I removed the dressing and it appears that the drain is clogged.  He has accumulated some seroma under his incision.  I attempted to clear the drain, but this did not resolve the seroma.  We attempted to aspirate the seroma, but obtained minimal drainage.  The incision is c/d/i.  I removed the drain since it seems to be non-functional.  He will compress the wound with an Ace wrap around the chest.  Follow-up in 1 week.  We may try to again to aspirate the seroma.  Wilmon Arms. [name], MD, [facility]  07/11/2011 5:43 PM

## 2011-07-16 ENCOUNTER — Encounter (INDEPENDENT_AMBULATORY_CARE_PROVIDER_SITE_OTHER): Payer: Medicare Other | Admitting: Surgery

## 2011-07-16 ENCOUNTER — Encounter: Payer: Self-pay | Admitting: Internal Medicine

## 2011-07-16 ENCOUNTER — Encounter (INDEPENDENT_AMBULATORY_CARE_PROVIDER_SITE_OTHER): Payer: Self-pay | Admitting: Surgery

## 2011-07-16 ENCOUNTER — Ambulatory Visit (INDEPENDENT_AMBULATORY_CARE_PROVIDER_SITE_OTHER): Payer: Medicare Other | Admitting: Surgery

## 2011-07-16 VITALS — BP 124/78 | HR 98 | Temp 98.7°F | Resp 14 | Ht 70.5 in | Wt 178.0 lb

## 2011-07-16 DIAGNOSIS — D1739 Benign lipomatous neoplasm of skin and subcutaneous tissue of other sites: Secondary | ICD-10-CM

## 2011-07-16 DIAGNOSIS — D1720 Benign lipomatous neoplasm of skin and subcutaneous tissue of unspecified limb: Secondary | ICD-10-CM

## 2011-07-16 NOTE — Progress Notes (Signed)
Patient ID: Dustin Medina, male   DOB: Jul 17, 1950, 61 y.o.   MRN: [id number] The incision is well healed. The drain site has sealed off. Dustin Medina has some localized dermatitis from the Steri-Strips. Dustin Medina still seems to have a seroma or hematoma under the skin. We attempted to aspirate this again and again failed to drain any fluid. The should resolve over time. It has not enlarged at all since last week. Dustin Medina may resume full activity. Followup p.r.n.  Wilmon Arms. [name], MD, [facility]  07/16/2011 9:29 AM

## 2012-05-05 ENCOUNTER — Telehealth: Payer: Self-pay | Admitting: Family Medicine

## 2012-05-05 DIAGNOSIS — L8993 Pressure ulcer of unspecified site, stage 3: Secondary | ICD-10-CM

## 2012-05-05 NOTE — Telephone Encounter (Signed)
Are we making him an appt. With the wound clinic?

## 2012-05-06 NOTE — Telephone Encounter (Signed)
Referral made in Epic.  

## 2012-05-10 ENCOUNTER — Encounter (HOSPITAL_BASED_OUTPATIENT_CLINIC_OR_DEPARTMENT_OTHER): Payer: Medicare Other | Attending: General Surgery

## 2012-05-10 DIAGNOSIS — L89209 Pressure ulcer of unspecified hip, unspecified stage: Secondary | ICD-10-CM | POA: Insufficient documentation

## 2012-05-10 DIAGNOSIS — Z993 Dependence on wheelchair: Secondary | ICD-10-CM | POA: Insufficient documentation

## 2012-05-10 DIAGNOSIS — Z79899 Other long term (current) drug therapy: Secondary | ICD-10-CM | POA: Insufficient documentation

## 2012-05-10 DIAGNOSIS — G8220 Paraplegia, unspecified: Secondary | ICD-10-CM | POA: Insufficient documentation

## 2012-05-10 DIAGNOSIS — I10 Essential (primary) hypertension: Secondary | ICD-10-CM | POA: Insufficient documentation

## 2012-05-10 DIAGNOSIS — L8992 Pressure ulcer of unspecified site, stage 2: Secondary | ICD-10-CM | POA: Insufficient documentation

## 2012-05-10 DIAGNOSIS — L89109 Pressure ulcer of unspecified part of back, unspecified stage: Secondary | ICD-10-CM | POA: Insufficient documentation

## 2012-05-10 DIAGNOSIS — IMO0002 Reserved for concepts with insufficient information to code with codable children: Secondary | ICD-10-CM | POA: Insufficient documentation
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
## 2012-05-13 ENCOUNTER — Other Ambulatory Visit: Payer: Self-pay

## 2012-05-13 MED ORDER — VALSARTAN-HYDROCHLOROTHIAZIDE 160-12.5 MG PO TABS: 1.0000 | ORAL_TABLET | Freq: Every day | ORAL | Status: DC

## 2012-05-15 ENCOUNTER — Other Ambulatory Visit: Payer: Self-pay | Admitting: *Deleted

## 2012-05-15 MED ORDER — SIMVASTATIN 40 MG PO TABS: 40.0000 mg | ORAL_TABLET | Freq: Every day | ORAL | Status: DC

## 2012-05-24 ENCOUNTER — Encounter (HOSPITAL_BASED_OUTPATIENT_CLINIC_OR_DEPARTMENT_OTHER): Payer: Medicare Other | Attending: General Surgery

## 2012-05-24 DIAGNOSIS — L89109 Pressure ulcer of unspecified part of back, unspecified stage: Secondary | ICD-10-CM | POA: Insufficient documentation

## 2012-05-24 DIAGNOSIS — G8220 Paraplegia, unspecified: Secondary | ICD-10-CM | POA: Insufficient documentation

## 2012-06-07 ENCOUNTER — Other Ambulatory Visit: Payer: Self-pay | Admitting: *Deleted

## 2012-06-07 ENCOUNTER — Encounter: Payer: Self-pay | Admitting: *Deleted

## 2012-06-07 MED ORDER — OMEPRAZOLE 20 MG PO CPDR: 20.0000 mg | DELAYED_RELEASE_CAPSULE | Freq: Two times a day (BID) | ORAL | Status: DC

## 2012-06-07 MED ORDER — NIACIN ER (ANTIHYPERLIPIDEMIC) 1000 MG PO TBCR: 1000.0000 mg | EXTENDED_RELEASE_TABLET | Freq: Every day | ORAL | Status: DC

## 2012-06-21 ENCOUNTER — Encounter (HOSPITAL_BASED_OUTPATIENT_CLINIC_OR_DEPARTMENT_OTHER): Payer: Medicare Other | Attending: General Surgery

## 2012-06-21 DIAGNOSIS — L8993 Pressure ulcer of unspecified site, stage 3: Secondary | ICD-10-CM | POA: Insufficient documentation

## 2012-06-21 DIAGNOSIS — L89109 Pressure ulcer of unspecified part of back, unspecified stage: Secondary | ICD-10-CM | POA: Insufficient documentation

## 2012-07-03 ENCOUNTER — Other Ambulatory Visit: Payer: Self-pay | Admitting: Family Medicine

## 2012-07-19 ENCOUNTER — Encounter (HOSPITAL_BASED_OUTPATIENT_CLINIC_OR_DEPARTMENT_OTHER): Payer: Medicare Other | Attending: General Surgery

## 2012-07-19 DIAGNOSIS — L8993 Pressure ulcer of unspecified site, stage 3: Secondary | ICD-10-CM | POA: Insufficient documentation

## 2012-07-19 DIAGNOSIS — L89899 Pressure ulcer of other site, unspecified stage: Secondary | ICD-10-CM | POA: Insufficient documentation

## 2012-07-19 DIAGNOSIS — L89109 Pressure ulcer of unspecified part of back, unspecified stage: Secondary | ICD-10-CM | POA: Insufficient documentation

## 2012-07-19 DIAGNOSIS — I87309 Chronic venous hypertension (idiopathic) without complications of unspecified lower extremity: Secondary | ICD-10-CM | POA: Insufficient documentation

## 2012-07-19 DIAGNOSIS — I872 Venous insufficiency (chronic) (peripheral): Secondary | ICD-10-CM | POA: Insufficient documentation

## 2012-07-19 DIAGNOSIS — L97809 Non-pressure chronic ulcer of other part of unspecified lower leg with unspecified severity: Secondary | ICD-10-CM | POA: Insufficient documentation

## 2012-07-24 ENCOUNTER — Encounter (HOSPITAL_COMMUNITY): Payer: Self-pay | Admitting: *Deleted

## 2012-07-24 ENCOUNTER — Ambulatory Visit (INDEPENDENT_AMBULATORY_CARE_PROVIDER_SITE_OTHER): Payer: Medicare Other | Admitting: Family Medicine

## 2012-07-24 ENCOUNTER — Inpatient Hospital Stay (HOSPITAL_COMMUNITY)
Admission: EM | Admit: 2012-07-24 | Discharge: 2012-07-27 | DRG: 871 | Disposition: A | Discharge status: Home Health | Payer: Medicare Other | Attending: Internal Medicine | Admitting: Internal Medicine

## 2012-07-24 ENCOUNTER — Emergency Department (HOSPITAL_COMMUNITY): Payer: Medicare Other

## 2012-07-24 VITALS — BP 121/72 | HR 112 | Temp 98.6°F

## 2012-07-24 DIAGNOSIS — L03119 Cellulitis of unspecified part of limb: Secondary | ICD-10-CM | POA: Diagnosis present

## 2012-07-24 DIAGNOSIS — Z79899 Other long term (current) drug therapy: Secondary | ICD-10-CM

## 2012-07-24 DIAGNOSIS — D1779 Benign lipomatous neoplasm of other sites: Secondary | ICD-10-CM | POA: Diagnosis present

## 2012-07-24 DIAGNOSIS — D72829 Elevated white blood cell count, unspecified: Secondary | ICD-10-CM | POA: Diagnosis present

## 2012-07-24 DIAGNOSIS — K226 Gastro-esophageal laceration-hemorrhage syndrome: Secondary | ICD-10-CM

## 2012-07-24 DIAGNOSIS — K922 Gastrointestinal hemorrhage, unspecified: Secondary | ICD-10-CM | POA: Insufficient documentation

## 2012-07-24 DIAGNOSIS — L02419 Cutaneous abscess of limb, unspecified: Secondary | ICD-10-CM | POA: Diagnosis present

## 2012-07-24 DIAGNOSIS — R000 Tachycardia, unspecified: Secondary | ICD-10-CM | POA: Diagnosis present

## 2012-07-24 DIAGNOSIS — Z86711 Personal history of pulmonary embolism: Secondary | ICD-10-CM

## 2012-07-24 DIAGNOSIS — L03116 Cellulitis of left lower limb: Secondary | ICD-10-CM | POA: Diagnosis present

## 2012-07-24 DIAGNOSIS — K219 Gastro-esophageal reflux disease without esophagitis: Secondary | ICD-10-CM

## 2012-07-24 DIAGNOSIS — R112 Nausea with vomiting, unspecified: Secondary | ICD-10-CM

## 2012-07-24 DIAGNOSIS — K449 Diaphragmatic hernia without obstruction or gangrene: Secondary | ICD-10-CM | POA: Diagnosis present

## 2012-07-24 DIAGNOSIS — G4730 Sleep apnea, unspecified: Secondary | ICD-10-CM | POA: Diagnosis present

## 2012-07-24 DIAGNOSIS — G8220 Paraplegia, unspecified: Secondary | ICD-10-CM

## 2012-07-24 DIAGNOSIS — K222 Esophageal obstruction: Secondary | ICD-10-CM

## 2012-07-24 DIAGNOSIS — E785 Hyperlipidemia, unspecified: Secondary | ICD-10-CM | POA: Diagnosis present

## 2012-07-24 DIAGNOSIS — I10 Essential (primary) hypertension: Secondary | ICD-10-CM

## 2012-07-24 DIAGNOSIS — E876 Hypokalemia: Secondary | ICD-10-CM | POA: Diagnosis present

## 2012-07-24 DIAGNOSIS — N390 Urinary tract infection, site not specified: Secondary | ICD-10-CM | POA: Diagnosis present

## 2012-07-24 DIAGNOSIS — M25519 Pain in unspecified shoulder: Secondary | ICD-10-CM | POA: Diagnosis present

## 2012-07-24 DIAGNOSIS — A419 Sepsis, unspecified organism: Principal | ICD-10-CM | POA: Diagnosis present

## 2012-07-24 DIAGNOSIS — N319 Neuromuscular dysfunction of bladder, unspecified: Secondary | ICD-10-CM | POA: Diagnosis present

## 2012-07-24 DIAGNOSIS — K921 Melena: Secondary | ICD-10-CM

## 2012-07-24 LAB — CBC WITH DIFFERENTIAL/PLATELET
Basophils Relative: 0 % (ref 0–1)
Eosinophils Absolute: 0 10*3/uL (ref 0.0–0.7)
Eosinophils Relative: 0 % (ref 0–5)
HCT: 42.5 % (ref 39.0–52.0)
Hemoglobin: 14.7 g/dL (ref 13.0–17.0)
Lymphocytes Relative: 3 % — ABNORMAL LOW (ref 12–46)
MCHC: 34.6 g/dL (ref 30.0–36.0)
Neutro Abs: 24.7 10*3/uL — ABNORMAL HIGH (ref 1.7–7.7)
Neutrophils Relative %: 94 % — ABNORMAL HIGH (ref 43–77)
RBC: 4.96 MIL/uL (ref 4.22–5.81)

## 2012-07-24 LAB — URINALYSIS, ROUTINE W REFLEX MICROSCOPIC
Glucose, UA: NEGATIVE mg/dL
Specific Gravity, Urine: 1.02 (ref 1.005–1.030)
Urobilinogen, UA: 1 mg/dL (ref 0.0–1.0)
pH: 6 (ref 5.0–8.0)

## 2012-07-24 LAB — URINE MICROSCOPIC-ADD ON

## 2012-07-24 LAB — CBC
Hemoglobin: 12.9 g/dL — ABNORMAL LOW (ref 13.0–17.0)
MCHC: 34.2 g/dL (ref 30.0–36.0)
Platelets: 220 10*3/uL (ref 150–400)

## 2012-07-24 LAB — LIPASE, BLOOD: Lipase: 12 U/L (ref 11–59)

## 2012-07-24 LAB — COMPREHENSIVE METABOLIC PANEL
ALT: 21 U/L (ref 0–53)
Alkaline Phosphatase: 76 U/L (ref 39–117)
BUN: 16 mg/dL (ref 6–23)
CO2: 28 mEq/L (ref 19–32)
Chloride: 97 mEq/L (ref 96–112)
GFR calc Af Amer: >90 mL/min (ref >90)
GFR calc non Af Amer: >90 mL/min (ref >90)
Glucose, Bld: 138 mg/dL — ABNORMAL HIGH (ref 70–99)
Potassium: 3.3 mEq/L — ABNORMAL LOW (ref 3.5–5.1)
Sodium: 139 mEq/L (ref 135–145)
Total Bilirubin: 0.9 mg/dL (ref 0.3–1.2)

## 2012-07-24 LAB — TSH: TSH: 0.41 u[IU]/mL (ref 0.350–4.500)

## 2012-07-24 LAB — LACTIC ACID, PLASMA: Lactic Acid, Venous: 2.4 mmol/L — ABNORMAL HIGH (ref 0.5–2.2)

## 2012-07-24 LAB — TYPE AND SCREEN

## 2012-07-24 MED ORDER — DEXTROSE 5 % IV SOLN
1.0000 g | Freq: Once | INTRAVENOUS | Status: AC
  Administered 2012-07-24: 1 g via INTRAVENOUS
  Filled 2012-07-24: qty 10

## 2012-07-24 MED ORDER — DEXTROSE 5 % IV SOLN
INTRAVENOUS | Status: AC
  Filled 2012-07-24: qty 10

## 2012-07-24 MED ORDER — VANCOMYCIN HCL IN DEXTROSE 1-5 GM/200ML-% IV SOLN
INTRAVENOUS | Status: AC
  Filled 2012-07-24: qty 200

## 2012-07-24 MED ORDER — DEXTROSE 5 % IV SOLN
1.0000 g | INTRAVENOUS | Status: DC
  Administered 2012-07-24 – 2012-07-26 (×3): 1 g via INTRAVENOUS
  Filled 2012-07-24 (×4): qty 10

## 2012-07-24 MED ORDER — PANTOPRAZOLE SODIUM 40 MG IV SOLR
40.0000 mg | Freq: Two times a day (BID) | INTRAVENOUS | Status: DC
  Administered 2012-07-24 – 2012-07-27 (×6): 40 mg via INTRAVENOUS
  Filled 2012-07-24 (×6): qty 40

## 2012-07-24 MED ORDER — SODIUM CHLORIDE 0.9 % IV BOLUS (SEPSIS)
1000.0000 mL | Freq: Once | INTRAVENOUS | Status: AC
  Administered 2012-07-24: 1000 mL via INTRAVENOUS

## 2012-07-24 MED ORDER — SODIUM CHLORIDE 0.9 % IV SOLN
INTRAVENOUS | Status: DC
  Administered 2012-07-24: 16:00:00 via INTRAVENOUS

## 2012-07-24 MED ORDER — ACETAMINOPHEN 325 MG PO TABS
650.0000 mg | ORAL_TABLET | Freq: Four times a day (QID) | ORAL | Status: DC | PRN
  Administered 2012-07-25: 650 mg via ORAL
  Filled 2012-07-24: qty 2

## 2012-07-24 MED ORDER — ONDANSETRON HCL 4 MG/2ML IJ SOLN
4.0000 mg | Freq: Four times a day (QID) | INTRAMUSCULAR | Status: DC | PRN
  Administered 2012-07-24: 4 mg via INTRAVENOUS
  Filled 2012-07-24: qty 2

## 2012-07-24 MED ORDER — SIMVASTATIN 20 MG PO TABS
40.0000 mg | ORAL_TABLET | Freq: Every day | ORAL | Status: DC
  Administered 2012-07-24 – 2012-07-26 (×3): 40 mg via ORAL
  Filled 2012-07-24 (×3): qty 2

## 2012-07-24 MED ORDER — POTASSIUM CHLORIDE CRYS ER 20 MEQ PO TBCR
40.0000 meq | EXTENDED_RELEASE_TABLET | Freq: Once | ORAL | Status: AC
  Administered 2012-07-24: 40 meq via ORAL
  Filled 2012-07-24: qty 2

## 2012-07-24 MED ORDER — ACETAMINOPHEN 650 MG RE SUPP: 650.0000 mg | Freq: Four times a day (QID) | RECTAL | Status: DC | PRN

## 2012-07-24 MED ORDER — MUPIROCIN 2 % EX OINT
1.0000 "application " | TOPICAL_OINTMENT | Freq: Two times a day (BID) | CUTANEOUS | Status: DC
  Administered 2012-07-24 – 2012-07-27 (×6): 1 via NASAL
  Filled 2012-07-24 (×2): qty 22

## 2012-07-24 MED ORDER — ACETAMINOPHEN 500 MG PO TABS
1000.0000 mg | ORAL_TABLET | Freq: Once | ORAL | Status: AC
  Administered 2012-07-24: 1000 mg via ORAL
  Filled 2012-07-24: qty 2

## 2012-07-24 MED ORDER — ALUM & MAG HYDROXIDE-SIMETH 200-200-20 MG/5ML PO SUSP: 15.0000 mL | Freq: Four times a day (QID) | ORAL | Status: DC | PRN

## 2012-07-24 MED ORDER — SODIUM CHLORIDE 0.9 % IV SOLN
Freq: Once | INTRAVENOUS | Status: AC
  Administered 2012-07-24: 1000 mL via INTRAVENOUS

## 2012-07-24 MED ORDER — NIACIN ER (ANTIHYPERLIPIDEMIC) 500 MG PO TBCR
1000.0000 mg | EXTENDED_RELEASE_TABLET | Freq: Every day | ORAL | Status: DC
  Administered 2012-07-25 – 2012-07-26 (×2): 1000 mg via ORAL
  Filled 2012-07-24 (×4): qty 2

## 2012-07-24 MED ORDER — VANCOMYCIN HCL IN DEXTROSE 1-5 GM/200ML-% IV SOLN
1000.0000 mg | Freq: Two times a day (BID) | INTRAVENOUS | Status: DC
  Administered 2012-07-24 – 2012-07-26 (×5): 1000 mg via INTRAVENOUS
  Filled 2012-07-24 (×8): qty 200

## 2012-07-24 MED ORDER — ONDANSETRON HCL 4 MG PO TABS: 4.0000 mg | ORAL_TABLET | Freq: Four times a day (QID) | ORAL | Status: DC | PRN

## 2012-07-24 MED ORDER — CHLORHEXIDINE GLUCONATE CLOTH 2 % EX PADS
6.0000 | MEDICATED_PAD | Freq: Every day | CUTANEOUS | Status: DC
  Administered 2012-07-25 – 2012-07-27 (×2): 6 via TOPICAL

## 2012-07-24 MED ORDER — ONDANSETRON HCL 4 MG/2ML IJ SOLN
4.0000 mg | Freq: Once | INTRAMUSCULAR | Status: AC
  Administered 2012-07-24: 4 mg via INTRAVENOUS
  Filled 2012-07-24: qty 2

## 2012-07-24 MED ORDER — PANTOPRAZOLE SODIUM 40 MG IV SOLR
40.0000 mg | Freq: Once | INTRAVENOUS | Status: AC
  Administered 2012-07-24: 40 mg via INTRAVENOUS
  Filled 2012-07-24: qty 40

## 2012-07-24 MED ORDER — POTASSIUM CHLORIDE IN NACL 20-0.9 MEQ/L-% IV SOLN
INTRAVENOUS | Status: DC
  Administered 2012-07-24 – 2012-07-26 (×4): via INTRAVENOUS

## 2012-07-24 NOTE — Progress Notes (Signed)
Dr. Jena Gauss notified of consult. Will see patient in the morning.
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
## 2012-07-24 NOTE — ED Notes (Signed)
Patient having chills, temp 99.9 orally.  Dr. Adriana Simas made aware of this along w/elevated WBC.
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
## 2012-07-24 NOTE — ED Notes (Signed)
Report called to Jessica, RN in ICU.

## 2012-07-24 NOTE — ED Notes (Signed)
Patient has been feeling run down for 2 weeks.  About 2 a.m. Today, vomited x 2, black fluid.  Had diarrhea, black stool.
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
## 2012-07-25 ENCOUNTER — Inpatient Hospital Stay (HOSPITAL_COMMUNITY): Payer: Medicare Other

## 2012-07-25 DIAGNOSIS — G8220 Paraplegia, unspecified: Secondary | ICD-10-CM

## 2012-07-25 DIAGNOSIS — L02419 Cutaneous abscess of limb, unspecified: Secondary | ICD-10-CM

## 2012-07-25 DIAGNOSIS — K922 Gastrointestinal hemorrhage, unspecified: Secondary | ICD-10-CM

## 2012-07-25 DIAGNOSIS — L03119 Cellulitis of unspecified part of limb: Secondary | ICD-10-CM

## 2012-07-25 LAB — BASIC METABOLIC PANEL
Calcium: 7.9 mg/dL — ABNORMAL LOW (ref 8.4–10.5)
GFR calc Af Amer: >90 mL/min (ref >90)
GFR calc non Af Amer: >90 mL/min (ref >90)
Glucose, Bld: 105 mg/dL — ABNORMAL HIGH (ref 70–99)
Potassium: 3.6 mEq/L (ref 3.5–5.1)
Sodium: 132 mEq/L — ABNORMAL LOW (ref 135–145)

## 2012-07-25 LAB — CBC
HCT: 38.4 % — ABNORMAL LOW (ref 39.0–52.0)
HCT: 35.6 % — ABNORMAL LOW (ref 39.0–52.0)
Hemoglobin: 12 g/dL — ABNORMAL LOW (ref 13.0–17.0)
MCH: 29.5 pg (ref 26.0–34.0)
MCH: 29.4 pg (ref 26.0–34.0)
MCHC: 33.7 g/dL (ref 30.0–36.0)
MCV: 86 fL (ref 78.0–100.0)
MCV: 87.3 fL (ref 78.0–100.0)
Platelets: 226 10*3/uL (ref 150–400)
Platelets: 230 10*3/uL (ref 150–400)
RBC: 4.22 MIL/uL (ref 4.22–5.81)
RBC: 4.4 MIL/uL (ref 4.22–5.81)
RDW: 15.5 % (ref 11.5–15.5)
RDW: 15.6 % — ABNORMAL HIGH (ref 11.5–15.5)
RDW: 15.6 % — ABNORMAL HIGH (ref 11.5–15.5)
WBC: 16.4 10*3/uL — ABNORMAL HIGH (ref 4.0–10.5)
WBC: 18.3 10*3/uL — ABNORMAL HIGH (ref 4.0–10.5)

## 2012-07-25 LAB — URINE CULTURE: Colony Count: NO GROWTH
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
## 2012-07-25 NOTE — Progress Notes (Signed)
[facility] PROGRESS NOTE  Dustin Medina ZOX:[id number] DOB: 10/22/1950 DOA: 07/24/2012 PCP: Redmond Baseman, MD  Assessment/Plan: 1. Upper GI bleeding. Patient's hemoglobin appears relatively stable. He is on clear liquid diet. GI has been consulted. Plans will likely be for endoscopy tomorrow. We'll continue to follow serial CBCs. Continue Protonix. 2. Cellulitis of left lower extremity. Patient is currently on broad-spectrum antibiotics including vancomycin and Rocephin. Checking venous Dopplers to rule out underlying DVT. Keep leg elevated. 3. Leukocytosis. Likely secondary to underlying infection. Improving. 4. Hypokalemia. Resolved 5. Paraplegia 6. Neurogenic bladder , Requires In and out catheterization every 4 hours. Followup urine culture  Code Status: full code Family Communication: discussed with patient and wife at the bedside Disposition Plan: discharge home once improved, transfer to telemetry.   Consultants:  Gastroenterology  Procedures:  none  Antibiotics:  Rocephin 67  Vancomycin 67  HPI/Subjective: Had one episode of vomiting last night and said it was dark material.  Has also had one bowel movement which he reports was brown in color.  Objective: Filed Vitals:   07/25/12 0500 07/25/12 0600 07/25/12 0700 07/25/12 0751  BP: 107/50 103/62 115/63   Pulse: 97 101 102   Temp:    99.2 F (37.3 C)  TempSrc:    Oral  Resp: 21 23 19    Height:      Weight: 81.5 kg (179 lb 10.8 oz)     SpO2: 94% 95% 96%     Intake/Output Summary (Last 24 hours) at 07/25/12 0856 Last data filed at 07/25/12 0758  Gross per 24 hour  Intake 1266.25 ml  Output    675 ml  Net 591.25 ml   Filed Weights   07/24/12 1040 07/24/12 1559 07/25/12 0500  Weight: 70.308 kg (155 lb) 74.6 kg (164 lb 7.4 oz) 81.5 kg (179 lb 10.8 oz)    Exam:   General:  NAD  Cardiovascular: S1, s2, RRR  Respiratory: CTA B  Abdomen: soft, nt, nd, bs+  Musculoskeletal: left lower  extremity redness, swelling in foot, pulses are difficult to palpate due to edema, but capillary refill is < 2 seconds.   Data Reviewed: Basic Metabolic Panel:  Recent Labs Lab 07/24/12 1043 07/25/12 0524  NA 139 132*  K 3.3* 3.6  CL 97 99  CO2 28 25  GLUCOSE 138* 105*  BUN 16 12  CREATININE 0.61 0.54  CALCIUM 9.2 7.9*   Liver Function Tests:  Recent Labs Lab 07/24/12 1043  AST 26  ALT 21  ALKPHOS 76  BILITOT 0.9  PROT 7.4  ALBUMIN 3.7    Recent Labs Lab 07/24/12 1043  LIPASE 12   No results found for this basename: AMMONIA,  in the last 168 hours CBC:  Recent Labs Lab 07/24/12 1043 07/24/12 1630 07/24/12 2357  WBC 26.3* 20.9* 18.3*  NEUTROABS 24.7*  --   --   HGB 14.7 12.9* 12.4*  HCT 42.5 37.7* 36.3*  MCV 85.7 86.9 86.0  PLT 256 220 226   Cardiac Enzymes: No results found for this basename: CKTOTAL, CKMB, CKMBINDEX, TROPONINI,  in the last 168 hours BNP (last 3 results) No results found for this basename: PROBNP,  in the last 8760 hours CBG: No results found for this basename: GLUCAP,  in the last 168 hours  Recent Results (from the past 240 hour(s))  CULTURE, BLOOD (ROUTINE X 2)     Status: None   Collection Time    07/24/12 12:40 PM      Result Value Range  Status   Specimen Description BLOOD LEFT ARM   Final   Special Requests BOTTLES DRAWN AEROBIC AND ANAEROBIC 6CC   Final   Culture PENDING   Incomplete   Report Status PENDING   Incomplete  CULTURE, BLOOD (ROUTINE X 2)     Status: None   Collection Time    07/24/12 12:45 PM      Result Value Range Status   Specimen Description BLOOD RIGHT HAND   Final   Special Requests BOTTLES DRAWN AEROBIC AND ANAEROBIC 6CC   Final   Culture PENDING   Incomplete   Report Status PENDING   Incomplete  MRSA PCR SCREENING     Status: Abnormal   Collection Time    07/24/12  3:35 PM      Result Value Range Status   MRSA by PCR POSITIVE (*) NEGATIVE Final   Comment: RESULT CALLED TO, READ BACK BY AND  VERIFIED WITH:     STONE A. AT 1702 ON [id number] BY THOMPSON S.                The GeneXpert MRSA Assay (FDA     approved for NASAL specimens     only), is one component of a     comprehensive MRSA colonization     surveillance program. It is not     intended to diagnose MRSA     infection nor to guide or     monitor treatment for     MRSA infections.     Studies: Dg Chest Portable 1 View  07/24/2012   *RADIOLOGY REPORT*  Clinical Data: Hematemesis.  Melanoma.  PORTABLE CHEST - 1 VIEW  Comparison: Chest x-ray of 07/03/2011.  Findings: Lung volumes are normal.  No acute consolidative airspace disease.  No pleural effusions.  Nodular density in the left suprahilar region is strongly favored to be a vessel seen end on, as this is similar to some prior examinations.  No other suspicious appearing pulmonary nodules or masses are identified.  Heart size is normal. The patient is rotated to the left on today's exam, resulting in distortion of the mediastinal contours and reduced diagnostic sensitivity and specificity for mediastinal pathology. Atherosclerosis in the thoracic aorta.  IMPRESSION: 1.  No radiographic evidence of acute cardiopulmonary disease. 2.  Atherosclerosis. 3.  Nodular density immediately lateral to the left suprahilar region favored to be vascular in etiology.  However, a repeat PA and lateral chest radiograph in 1-2 months is recommended to ensure the stability or resolution of this finding.   Original Report Authenticated By: Trudie Reed, M.D.    Scheduled Meds: . cefTRIAXone (ROCEPHIN)  IV  1 g Intravenous Q24H  . Chlorhexidine Gluconate Cloth  6 each Topical Q0600  . mupirocin ointment  1 application Nasal BID  . niacin  1,000 mg Oral QHS  . pantoprazole (PROTONIX) IV  40 mg Intravenous Q12H  . simvastatin  40 mg Oral q1800  . vancomycin  1,000 mg Intravenous Q12H   Continuous Infusions: . 0.9 % NaCl with KCl 20 mEq / L 75 mL/hr at 07/25/12 0454    Active Problems:    GERD   GI bleed   Paraplegia   Urinary tract infection   Leukocytosis, unspecified   Tachycardia   Cellulitis of leg, left    Time spent:    [facility]  [facility] Pager [phone]. If 7PM-7AM, please contact night-coverage at [email or web address], password [facility] 07/25/2012, 8:56 AM  LOS: 1 day

## 2012-07-26 ENCOUNTER — Encounter (HOSPITAL_COMMUNITY)
Admission: EM | Disposition: A | Discharge status: Home Health | Payer: Self-pay | Source: Home / Self Care | Attending: Internal Medicine

## 2012-07-26 ENCOUNTER — Encounter (HOSPITAL_COMMUNITY): Payer: Self-pay | Admitting: *Deleted

## 2012-07-26 HISTORY — PX: ESOPHAGOGASTRODUODENOSCOPY: SHX5428

## 2012-07-26 LAB — BASIC METABOLIC PANEL
BUN: 6 mg/dL (ref 6–23)
Calcium: 8.1 mg/dL — ABNORMAL LOW (ref 8.4–10.5)
GFR calc Af Amer: >90 mL/min (ref >90)
GFR calc non Af Amer: >90 mL/min (ref >90)
Glucose, Bld: 97 mg/dL (ref 70–99)

## 2012-07-26 LAB — CBC
Hemoglobin: 11.5 g/dL — ABNORMAL LOW (ref 13.0–17.0)
MCH: 29 pg (ref 26.0–34.0)
MCH: 29.2 pg (ref 26.0–34.0)
MCHC: 33.3 g/dL (ref 30.0–36.0)
MCHC: 33.6 g/dL (ref 30.0–36.0)
MCV: 87.1 fL (ref 78.0–100.0)
MCV: 86.8 fL (ref 78.0–100.0)
Platelets: 201 10*3/uL (ref 150–400)
RDW: 15.4 % (ref 11.5–15.5)
WBC: 9.6 10*3/uL (ref 4.0–10.5)

## 2012-07-26 SURGERY — EGD (ESOPHAGOGASTRODUODENOSCOPY)
Anesthesia: Moderate Sedation

## 2012-07-26 MED ORDER — ONDANSETRON HCL 4 MG/2ML IJ SOLN
INTRAMUSCULAR | Status: DC | PRN
  Administered 2012-07-26: 4 mg via INTRAVENOUS

## 2012-07-26 MED ORDER — SODIUM CHLORIDE 0.9 % IV SOLN: INTRAVENOUS | Status: DC

## 2012-07-26 MED ORDER — MEPERIDINE HCL 100 MG/ML IJ SOLN
INTRAMUSCULAR | Status: DC | PRN
  Administered 2012-07-26: 25 mg via INTRAVENOUS
  Administered 2012-07-26: 50 mg via INTRAVENOUS

## 2012-07-26 MED ORDER — SODIUM CHLORIDE 0.9 % IV SOLN
INTRAVENOUS | Status: DC
  Administered 2012-07-26: 09:00:00 via INTRAVENOUS

## 2012-07-26 MED ORDER — MEPERIDINE HCL 100 MG/ML IJ SOLN
INTRAMUSCULAR | Status: AC
  Filled 2012-07-26: qty 2

## 2012-07-26 MED ORDER — BUTAMBEN-TETRACAINE-BENZOCAINE 2-2-14 % EX AERO
INHALATION_SPRAY | CUTANEOUS | Status: DC | PRN
  Administered 2012-07-26: 2 via TOPICAL

## 2012-07-26 MED ORDER — MIDAZOLAM HCL 5 MG/5ML IJ SOLN
INTRAMUSCULAR | Status: DC | PRN
  Administered 2012-07-26 (×2): 2 mg via INTRAVENOUS

## 2012-07-26 MED ORDER — MIDAZOLAM HCL 5 MG/5ML IJ SOLN
INTRAMUSCULAR | Status: AC
  Filled 2012-07-26: qty 10

## 2012-07-26 MED ORDER — ONDANSETRON HCL 4 MG/2ML IJ SOLN
INTRAMUSCULAR | Status: AC
  Filled 2012-07-26: qty 2

## 2012-07-26 MED ORDER — STERILE WATER FOR IRRIGATION IR SOLN
Status: DC | PRN
  Administered 2012-07-26: 10:00:00

## 2012-07-26 NOTE — Op Note (Signed)
[facility] [address] [place], [address]   ENDOSCOPY PROCEDURE REPORT  PATIENT: Dustin Medina, Dustin Medina.  MR#: [id number] BIRTHDATE: Mar 31, 1950 , 62  yrs. old GENDER: Male ENDOSCOPIST: R.  Roetta Sessions, MD FACP FACG REFERRED BY:  Leodis Sias, M.D. PROCEDURE DATE:  07/26/2012 PROCEDURE:     EGD-diagnostic  INDICATIONS:     Coffee-ground emesis/melena; decline in hemoglobin from 14-11; patient has remained entirely stable for the past 48 hours since admission.  INFORMED CONSENT:   The risks, benefits, limitations, alternatives and imponderables have been discussed.  The potential for biopsy, esophogeal dilation, etc. have also been reviewed.  Questions have been answered.  All parties agreeable.  Please see the history and physical in the medical record for more information.  MEDICATIONS:  Versed 4 mg IV.  Demerol 75 mg IV in divided doses. Zofran 4 mg IV. Cetacaine spray.  DESCRIPTION OF PROCEDURE:   The EG-[id number] ([id number])  endoscope was introduced through the mouth and advanced to the second portion of the duodenum without difficulty or limitations.  The mucosal surfaces were surveyed very carefully during advancement of the scope and upon withdrawal.  Retroflexion view of the proximal stomach and esophagogastric junction was performed.      FINDINGS:  Area of mucosal disruption  - distal esophagus within 2 cm the GE junction most consistent with a Mallory-Weiss tear - healing.  Noncritical Schatzki's ring present. The esophageal mucosa otherwise appeared normal aside  "corkscrewing" of the esphagea; body.  Stomach empty. 3 cm hiatal hernia. Normal gastric mucosa. Patent pylorus. Normal first and second portion of the duodenum.  THERAPEUTIC / DIAGNOSTIC MANEUVERS PERFORMED:  None   COMPLICATIONS:  None  IMPRESSION:  Recent upper GI bleed secondary to Mallory-Weiss tear. Noncritical Schatzki's ring. Hiatal hernia.  RECOMMENDATIONS:    Advance diet.  Continue twice a day proton pump inhibitor therapy. Home soon from a GI standpoint.   _______________________________ R. Roetta Sessions, MD FACP [facility] eSigned:  R. Roetta Sessions, MD FACP [facility] 07/26/2012 10:24 AM     CC:  PATIENT NAME:  Dustin Medina, Dustin Medina. MR#: [id number]

## 2012-07-26 NOTE — Care Management Note (Addendum)
    Page 1 of 2   07/27/2012     11:54:44 AM   CARE MANAGEMENT NOTE 07/27/2012  Patient:  Dustin Medina, Dustin Medina   Account Number:  [id number]  Date Initiated:  07/26/2012  Documentation initiated by:  Anibal Henderson  Subjective/Objective Assessment:   Admitted with the G.I. bleed. Patient is a paraplegic, but lives at home alone and has for many years. He has a wheelchair and a [employer] adapted for driving. He is fairly independent in the home and community     Action/Plan:   patient has a family, and a friend available. He has advanced homecare active in the home, caring for a pressure ulcer, and would like to continue with them. Will need resume home health orders at discharge.   Anticipated DC Date:  07/27/2012   Anticipated DC Plan:  HOME W HOME HEALTH SERVICES  In-house referral  Clinical Social Worker      DC Planning Services  CM consult      [facility] [place] Choice  HOME HEALTH   Choice offered to / List presented to:  C-1 Patient        HH arranged  HH-1 RN      [facility] agency  [facility]   Status of service:  Completed, signed off Medicare Important Message given?  YES (If response is "NO", the following Medicare IM given date fields will be blank) Date Medicare IM given:  07/27/2012 Date Additional Medicare IM given:    Discharge Disposition:  HOME W HOME HEALTH SERVICES  Per UR Regulation:  Reviewed for med. necessity/level of care/duration of stay  If discussed at Long Length of Stay Meetings, dates discussed:    Comments:  07/27/12 1153 Arlyss Queen, RN BSN CM Pt discharged home today with resumption of [facility]. Alroy Bailiff of [facility] is aware and will collect the pts information from the chart. HH services to start within 48 hours. Pt denies any DME needs. Pt and pts nurse aware of discharge arrangements.  07/26/12 1100 Anibal Henderson RN/CM referred to CSW for living will at pt request

## 2012-07-26 NOTE — Clinical Social Work Note (Signed)
CSW received referral for assistance with advance directives. Pt down for procedure, but packet left in room. Chaplain plans to follow up with pt to complete. CSW signing off but can be reconsulted if needed.  Derenda Fennel, [place] [phone]

## 2012-07-26 NOTE — Progress Notes (Signed)
UR Chart Review Completed  

## 2012-07-26 NOTE — Progress Notes (Signed)
[facility] PROGRESS NOTE  BLADYN TIPPS ZOX:[id number] DOB: 10/31/1950 DOA: 07/24/2012 PCP: Redmond Baseman, MD  Assessment/Plan: 1. Upper GI bleeding. Patient's hemoglobin appears relatively stable. He is on clear liquid diet. GI has been consulted. Patient underwent endoscopy today which revealed Mallory-Weiss tear. No signs of active bleeding. Hemoglobin is stable. Continue Protonix. 2. Cellulitis of left lower extremity. Patient is currently on broad-spectrum antibiotics including vancomycin and Rocephin. Venous Dopplers were negative for DVT. Erythema appears to be improving. Continue current antibiotics 3. Leukocytosis. Likely secondary to underlying infection. Improving. 4. Hypokalemia. Resolved 5. Paraplegia 6. Neurogenic bladder , Requires In and out catheterization every 4 hours. Followup urine culture  Code Status: full code Family Communication: discussed with patient and wife at the bedside Disposition Plan: discharge home once improved, probable discharge home in the next 24-48 hours    Consultants:  Gastroenterology  Procedures:  none  Antibiotics:  Rocephin 6/7  Vancomycin 6/7  HPI/Subjective: No new complaints. No vomiting or diarrhea.  Objective: Filed Vitals:   07/26/12 1010 07/26/12 1015 07/26/12 1020 07/26/12 1456  BP: 143/81 130/77 133/67 123/70  Pulse: 95 92 88 80  Temp:      TempSrc:      Resp: 16 17 17 18   Height:      Weight:      SpO2: 98% 96% 96% 98%    Intake/Output Summary (Last 24 hours) at 07/26/12 1806 Last data filed at 07/26/12 1749  Gross per 24 hour  Intake   2650 ml  Output   2300 ml  Net    350 ml   Filed Weights   07/24/12 1040 07/24/12 1559 07/25/12 0500  Weight: 70.308 kg (155 lb) 74.6 kg (164 lb 7.4 oz) 81.5 kg (179 lb 10.8 oz)    Exam:   General:  NAD  Cardiovascular: S1, s2, RRR  Respiratory: CTA B  Abdomen: soft, nt, nd, bs+  Musculoskeletal: Lower extremity erythema appears to be slowly  improving.   Data Reviewed: Basic Metabolic Panel:  Recent Labs Lab 07/24/12 1043 07/25/12 0524 07/26/12 0415  NA 139 132* 136  K 3.3* 3.6 3.8  CL 97 99 102  CO2 28 25 28   GLUCOSE 138* 105* 97  BUN 16 12 6   CREATININE 0.61 0.54 0.55  CALCIUM 9.2 7.9* 8.1*   Liver Function Tests:  Recent Labs Lab 07/24/12 1043  AST 26  ALT 21  ALKPHOS 76  BILITOT 0.9  PROT 7.4  ALBUMIN 3.7    Recent Labs Lab 07/24/12 1043  LIPASE 12   No results found for this basename: AMMONIA,  in the last 168 hours CBC:  Recent Labs Lab 07/24/12 1043  07/24/12 2357 07/25/12 1400 07/25/12 2124 07/26/12 0415 07/26/12 0801  WBC 26.3*  < > 18.3* 16.4* 12.8* 9.0 9.6  NEUTROABS 24.7*  --   --   --   --   --   --   HGB 14.7  < > 12.4* 13.0 12.0* 11.5* 11.7*  HCT 42.5  < > 36.3* 38.4* 35.6* 34.5* 34.8*  MCV 85.7  < > 86.0 87.3 87.3 87.1 86.8  PLT 256  < > 226 230 206 212 201  < > = values in this interval not displayed. Cardiac Enzymes: No results found for this basename: CKTOTAL, CKMB, CKMBINDEX, TROPONINI,  in the last 168 hours BNP (last 3 results) No results found for this basename: PROBNP,  in the last 8760 hours CBG: No results found for this basename: GLUCAP,  in the  last 168 hours  Recent Results (from the past 240 hour(s))  URINE CULTURE     Status: None   Collection Time    07/24/12 12:25 PM      Result Value Range Status   Specimen Description URINE, CATHETERIZED   Final   Special Requests NONE   Final   Culture  Setup Time 07/24/2012 19:42   Final   Colony Count NO GROWTH   Final   Culture NO GROWTH   Final   Report Status 07/25/2012 FINAL   Final  CULTURE, BLOOD (ROUTINE X 2)     Status: None   Collection Time    07/24/12 12:40 PM      Result Value Range Status   Specimen Description BLOOD LEFT ARM   Final   Special Requests BOTTLES DRAWN AEROBIC AND ANAEROBIC 6CC   Final   Culture NO GROWTH 2 DAYS   Final   Report Status PENDING   Incomplete  CULTURE, BLOOD  (ROUTINE X 2)     Status: None   Collection Time    07/24/12 12:45 PM      Result Value Range Status   Specimen Description BLOOD RIGHT HAND   Final   Special Requests BOTTLES DRAWN AEROBIC AND ANAEROBIC 6CC   Final   Culture NO GROWTH 2 DAYS   Final   Report Status PENDING   Incomplete  MRSA PCR SCREENING     Status: Abnormal   Collection Time    07/24/12  3:35 PM      Result Value Range Status   MRSA by PCR POSITIVE (*) NEGATIVE Final   Comment: RESULT CALLED TO, READ BACK BY AND VERIFIED WITH:     STONE A. AT 1702 ON [id number] BY THOMPSON S.                The GeneXpert MRSA Assay (FDA     approved for NASAL specimens     only), is one component of a     comprehensive MRSA colonization     surveillance program. It is not     intended to diagnose MRSA     infection nor to guide or     monitor treatment for     MRSA infections.     Studies: US Venous Img Lower Unilateral Left  07/25/2012   *RADIOLOGY REPORT*  Clinical Data: 62 year old male with abnormal lower extremity redness and swelling.  History of pulmonary embolus.  Remote history of DVT.  LEFT LOWER EXTREMITY VENOUS DUPLEX ULTRASOUND  Technique:  Gray-scale sonography with graded compression, as well as color Doppler and duplex ultrasound, were performed to evaluate the deep venous system of the lower extremity from the level of the common femoral vein through the popliteal and proximal calf veins. Spectral Doppler was utilized to evaluate flow at rest and with distal augmentation maneuvers.  Comparison:  Left lower extremity MRI 05/14/2003.  Findings: Normal compressibility of the left common femoral, superficial femoral, and popliteal veins is demonstrated, as well as the visualized proximal calf veins.  No filling defects to suggest DVT on grayscale or color Doppler imaging.  Doppler waveforms show normal direction of venous flow, normal respiratory phasicity and response to augmentation.  Evidence of widespread subcutaneous  edema in the left lower extremity.  Visualized left greater saphenous vein appears normally compressible and patent.  IMPRESSION: No evidence of left lower extremity deep venous thrombosis. Evidence of widespread subcutaneous edema.   Original Report Authenticated By: Erskine Speed, M.D.  Scheduled Meds: . cefTRIAXone (ROCEPHIN)  IV  1 g Intravenous Q24H  . Chlorhexidine Gluconate Cloth  6 each Topical Q0600  . meperidine      . midazolam      . mupirocin ointment  1 application Nasal BID  . niacin  1,000 mg Oral QHS  . ondansetron      . pantoprazole (PROTONIX) IV  40 mg Intravenous Q12H  . simvastatin  40 mg Oral q1800  . vancomycin  1,000 mg Intravenous Q12H   Continuous Infusions:    Active Problems:   GERD   GI bleed   Paraplegia   Urinary tract infection   Leukocytosis, unspecified   Tachycardia   Cellulitis of leg, left    Time spent:    [facility]  [facility] Pager [phone]. If 7PM-7AM, please contact night-coverage at [email or web address], password [email or web address] 07/26/2012, 6:06 PM  LOS: 2 days

## 2012-07-27 ENCOUNTER — Encounter (HOSPITAL_COMMUNITY): Payer: Self-pay | Admitting: Internal Medicine

## 2012-07-27 ENCOUNTER — Telehealth: Payer: Self-pay | Admitting: Gastroenterology

## 2012-07-27 DIAGNOSIS — K922 Gastrointestinal hemorrhage, unspecified: Secondary | ICD-10-CM

## 2012-07-27 DIAGNOSIS — K219 Gastro-esophageal reflux disease without esophagitis: Secondary | ICD-10-CM

## 2012-07-27 DIAGNOSIS — K226 Gastro-esophageal laceration-hemorrhage syndrome: Secondary | ICD-10-CM

## 2012-07-27 DIAGNOSIS — A419 Sepsis, unspecified organism: Secondary | ICD-10-CM | POA: Diagnosis present

## 2012-07-27 LAB — CBC
Hemoglobin: 11.5 g/dL — ABNORMAL LOW (ref 13.0–17.0)
MCHC: 33.9 g/dL (ref 30.0–36.0)
Platelets: 206 10*3/uL (ref 150–400)
RDW: 14.9 % (ref 11.5–15.5)

## 2012-07-27 MED ORDER — ALUM & MAG HYDROXIDE-SIMETH 200-200-20 MG/5ML PO SUSP: 15.0000 mL | Freq: Four times a day (QID) | ORAL | Status: DC | PRN

## 2012-07-27 MED ORDER — DOXYCYCLINE HYCLATE 100 MG PO CAPS: 100.0000 mg | ORAL_CAPSULE | Freq: Two times a day (BID) | ORAL | Status: DC

## 2012-07-27 NOTE — Progress Notes (Signed)
Subjective: No abdominal pain, no N/V, tolerating diet. Left shoulder pain after straining to reach bedside table. States he had similar discomfort as an outpatient after riding on the tractor.   Objective: Vital signs in last 24 hours: Temp:  [97.9 F (36.6 C)-99.5 F (37.5 C)] 98.6 F (37 C) (06/10 0525) Pulse Rate:  [76-95] 82 (06/10 0525) Resp:  [12-22] 20 (06/10 0525) BP: (114-145)/(62-86) 118/65 mmHg (06/10 0525) SpO2:  [95 %-100 %] 95 % (06/10 0525) Last BM Date: 07/24/12 General:   Alert and oriented, pleasant Head:  Normocephalic and atraumatic. Eyes:  No icterus, sclera clear. Conjuctiva pink.  Heart:  S1, S2 present, no murmurs noted.  Lungs: Clear to auscultation bilaterally, without wheezing, rales, or rhonchi.  Abdomen:  Bowel sounds present, soft, non-tender, non-distended. No HSM or hernias noted. No rebound or guarding. No masses appreciated  Msk:  paraplegia Extremities:  Muscle wasting bilateral lower extremities, left lower extremity with 3+ edema, erythema Neurologic:  Alert and  oriented x4 Skin:  Warm and dry, intact without significant lesions.  Psych:  Alert and cooperative. Normal mood and affect.  Intake/Output from previous day: 06/09 0701 - 06/10 0700 In: 680 [P.O.:480; IV Piggyback:200] Out: 1600 [Urine:1600] Intake/Output this shift:    Lab Results:  Recent Labs  07/26/12 0415 07/26/12 0801 07/27/12 0534  WBC 9.0 9.6 7.8  HGB 11.5* 11.7* 11.5*  HCT 34.5* 34.8* 33.9*  PLT 212 201 206   BMET  Recent Labs  07/24/12 1043 07/25/12 0524 07/26/12 0415  NA 139 132* 136  K 3.3* 3.6 3.8  CL 97 99 102  CO2 28 25 28   GLUCOSE 138* 105* 97  BUN 16 12 6   CREATININE 0.61 0.54 0.55  CALCIUM 9.2 7.9* 8.1*   LFT  Recent Labs  07/24/12 1043  PROT 7.4  ALBUMIN 3.7  AST 26  ALT 21  ALKPHOS 76  BILITOT 0.9     Studies/Results: US Venous Img Lower Unilateral Left  07/25/2012   *RADIOLOGY REPORT*  Clinical Data: 62 year old male with  abnormal lower extremity redness and swelling.  History of pulmonary embolus.  Remote history of DVT.  LEFT LOWER EXTREMITY VENOUS DUPLEX ULTRASOUND  Technique:  Gray-scale sonography with graded compression, as well as color Doppler and duplex ultrasound, were performed to evaluate the deep venous system of the lower extremity from the level of the common femoral vein through the popliteal and proximal calf veins. Spectral Doppler was utilized to evaluate flow at rest and with distal augmentation maneuvers.  Comparison:  Left lower extremity MRI 05/14/2003.  Findings: Normal compressibility of the left common femoral, superficial femoral, and popliteal veins is demonstrated, as well as the visualized proximal calf veins.  No filling defects to suggest DVT on grayscale or color Doppler imaging.  Doppler waveforms show normal direction of venous flow, normal respiratory phasicity and response to augmentation.  Evidence of widespread subcutaneous edema in the left lower extremity.  Visualized left greater saphenous vein appears normally compressible and patent.  IMPRESSION: No evidence of left lower extremity deep venous thrombosis. Evidence of widespread subcutaneous edema.   Original Report Authenticated By: Erskine Speed, M.D.    Assessment: 62 year old male admitted with hematemesis, melena, and slight drift in Hgb overall. EGD this admission with Mallory-Weiss tear, healing. Non-critical Schatzki's ring present. Otherwise normal. Tolerating diet, Hgb stable. Will sign off, follow-up as outpatient.     Plan: PPI BID Outpatient follow-up in our office, we will arrange Signing off  Nira Retort,  ANP-BC [facility]    LOS: 3 days    07/27/2012, 7:59 AM

## 2012-07-27 NOTE — Discharge Summary (Signed)
Physician Discharge Summary  Dustin Medina XBJ:[id number] DOB: Jul 25, 1950 DOA: 07/24/2012  PCP: Dustin Baseman, MD  Admit date: 07/24/2012 Discharge date: 07/27/2012  Time spent: 40 minutes  Recommendations for Outpatient Follow-up:  1. Follow up with primary care doctor in 1 week for recheck of cellulitis 2. Follow up with GI, they will arrange appointment  Discharge Diagnoses:  Active Problems:   GERD   GI bleed   Paraplegia   Urinary tract infection   Leukocytosis, unspecified   Tachycardia   Cellulitis of leg, left   Sepsis   Discharge Condition: improved  Diet recommendation: low salt  Filed Weights   07/24/12 1040 07/24/12 1559 07/25/12 0500  Weight: 70.308 kg (155 lb) 74.6 kg (164 lb 7.4 oz) 81.5 kg (179 lb 10.8 oz)    History of present illness:  Dustin Medina is a 62 y.o. male who is a paraplegic and has a history of GERD. Patient was in his usual state of health when this morning he woke up in had noted palpitations. He developed the urge to throw up and subsequently vomited into a bucket. He noted to have significant coffee-ground emesis. He reports 2-3 episodes of vomiting, followed by melena. We will set multiple dark colored stools. He went to his primary care physician for evaluation and was directed to the emergency room. He does feel somewhat lightheaded today. He is reported fatigue over the past 2 weeks. Denies any chest pain, shortness of breath. He reports using ibuprofen, very infrequently. He does take Alka-Seltzer for his GERD which contains aspirin. He reported having an endoscopy a few years ago with Dr. Jena Medina and was told that it was noted that he had erosions. He also had a colonoscopy done at that time and reports that it was unremarkable. He was evaluated in the emergency room and noted to have heme-positive stools. He was also significantly tachycardic with a heart rate in the 110-120s. Referred for admission   Hospital Course:  Upper GI  bleeding. Patient was admitted to the hospital with hematemesis and melena. He was seen by GI and underwent EGD which revealed recent bleeding from Mallory-Weiss tear. Hemoglobin has been stable in the hospital and has not required a blood transfusion. He is continued on proton pump inhibitors and will followup with GI as an outpatient.  Sepsis due to cellulitis. Patient was admitted with leukocytosis, tachycardia, and significant left lower extremity cellulitis. He was started on vancomycin and Rocephin. During his hospital course, his leukocytosis has resolved and he is afebrile. Cellulitis appears to be gradually improving. He's been transitioned to oral doxycycline. He has been advised to keep his left leg elevated. He can follow up with primary physician in the next week to recheck cellulitis.  Procedures: EGD: Recent upper GI bleed secondary to Mallory-Weiss tear.  Noncritical Schatzki's ring. Hiatal hernia.    Consultations:  Gastroenterology  Discharge Exam: Filed Vitals:   07/26/12 1020 07/26/12 1456 07/26/12 2039 07/27/12 0525  BP: 133/67 123/70 114/62 118/65  Pulse: 88 80 76 82  Temp:   99.5 F (37.5 C) 98.6 F (37 C)  TempSrc:   Oral Oral  Resp: 17 18 20 20   Height:      Weight:      SpO2: 96% 98% 97% 95%    General: NAD Cardiovascular: S1, S2 RRR Respiratory: CTA B Skin: LLE erythema improving  Discharge Instructions  Discharge Orders   Future Orders Complete By Expires     Call MD for:  redness, tenderness,  or signs of infection (pain, swelling, redness, odor or green/yellow discharge around incision site)  As directed     Call MD for:  severe uncontrolled pain  As directed     Call MD for:  temperature >100.4  As directed     Diet - low sodium heart healthy  As directed     Increase activity slowly  As directed         Medication List    STOP taking these medications       aspirin-sod bicarb-citric acid 325 MG Tbef  Commonly known as:  ALKA-SELTZER       TAKE these medications       alum & mag hydroxide-simeth 200-200-20 MG/5ML suspension  Commonly known as:  MAALOX/MYLANTA  Take 15 mLs by mouth every 6 (six) hours as needed.     doxycycline 100 MG capsule  Commonly known as:  VIBRAMYCIN  Take 1 capsule (100 mg total) by mouth 2 (two) times daily.     multivitamin with minerals Tabs  Take 1 tablet by mouth daily.     niacin 1000 MG CR tablet  Commonly known as:  NIASPAN  Take 1 tablet (1,000 mg total) by mouth at bedtime.     omeprazole 20 MG capsule  Commonly known as:  PRILOSEC  Take 1 capsule (20 mg total) by mouth 2 (two) times daily.     simvastatin 40 MG tablet  Commonly known as:  ZOCOR  TAKE 1 TABLET (40 MG TOTAL) BY MOUTH AT BEDTIME.     trimethoprim 100 MG tablet  Commonly known as:  TRIMPEX  Take 100 mg by mouth daily.     valsartan-hydrochlorothiazide 160-12.5 MG per tablet  Commonly known as:  DIOVAN-HCT  Take 1 tablet by mouth daily with breakfast.       No Known Allergies    The results of significant diagnostics from this hospitalization (including imaging, microbiology, ancillary and laboratory) are listed below for reference.    Significant Diagnostic Studies: US Venous Img Lower Unilateral Left  07/25/2012   *RADIOLOGY REPORT*  Clinical Data: 62 year old male with abnormal lower extremity redness and swelling.  History of pulmonary embolus.  Remote history of DVT.  LEFT LOWER EXTREMITY VENOUS DUPLEX ULTRASOUND  Technique:  Gray-scale sonography with graded compression, as well as color Doppler and duplex ultrasound, were performed to evaluate the deep venous system of the lower extremity from the level of the common femoral vein through the popliteal and proximal calf veins. Spectral Doppler was utilized to evaluate flow at rest and with distal augmentation maneuvers.  Comparison:  Left lower extremity MRI 05/14/2003.  Findings: Normal compressibility of the left common femoral, superficial femoral,  and popliteal veins is demonstrated, as well as the visualized proximal calf veins.  No filling defects to suggest DVT on grayscale or color Doppler imaging.  Doppler waveforms show normal direction of venous flow, normal respiratory phasicity and response to augmentation.  Evidence of widespread subcutaneous edema in the left lower extremity.  Visualized left greater saphenous vein appears normally compressible and patent.  IMPRESSION: No evidence of left lower extremity deep venous thrombosis. Evidence of widespread subcutaneous edema.   Original Report Authenticated By: Erskine Speed, M.D.   Dg Chest Portable 1 View  07/24/2012   *RADIOLOGY REPORT*  Clinical Data: Hematemesis.  Melanoma.  PORTABLE CHEST - 1 VIEW  Comparison: Chest x-ray of 07/03/2011.  Findings: Lung volumes are normal.  No acute consolidative airspace disease.  No pleural effusions.  Nodular density in the left suprahilar region is strongly favored to be a vessel seen end on, as this is similar to some prior examinations.  No other suspicious appearing pulmonary nodules or masses are identified.  Heart size is normal. The patient is rotated to the left on today's exam, resulting in distortion of the mediastinal contours and reduced diagnostic sensitivity and specificity for mediastinal pathology. Atherosclerosis in the thoracic aorta.  IMPRESSION: 1.  No radiographic evidence of acute cardiopulmonary disease. 2.  Atherosclerosis. 3.  Nodular density immediately lateral to the left suprahilar region favored to be vascular in etiology.  However, a repeat PA and lateral chest radiograph in 1-2 months is recommended to ensure the stability or resolution of this finding.   Original Report Authenticated By: Trudie Reed, M.D.    Microbiology: Recent Results (from the past 240 hour(s))  URINE CULTURE     Status: None   Collection Time    07/24/12 12:25 PM      Result Value Range Status   Specimen Description URINE, CATHETERIZED   Final    Special Requests NONE   Final   Culture  Setup Time 07/24/2012 19:42   Final   Colony Count NO GROWTH   Final   Culture NO GROWTH   Final   Report Status 07/25/2012 FINAL   Final  CULTURE, BLOOD (ROUTINE X 2)     Status: None   Collection Time    07/24/12 12:40 PM      Result Value Range Status   Specimen Description BLOOD LEFT ARM   Final   Special Requests BOTTLES DRAWN AEROBIC AND ANAEROBIC 6CC   Final   Culture NO GROWTH 3 DAYS   Final   Report Status PENDING   Incomplete  CULTURE, BLOOD (ROUTINE X 2)     Status: None   Collection Time    07/24/12 12:45 PM      Result Value Range Status   Specimen Description BLOOD RIGHT HAND   Final   Special Requests BOTTLES DRAWN AEROBIC AND ANAEROBIC 6CC   Final   Culture NO GROWTH 3 DAYS   Final   Report Status PENDING   Incomplete  MRSA PCR SCREENING     Status: Abnormal   Collection Time    07/24/12  3:35 PM      Result Value Range Status   MRSA by PCR POSITIVE (*) NEGATIVE Final   Comment: RESULT CALLED TO, READ BACK BY AND VERIFIED WITH:     STONE A. AT 1702 ON [id number] BY THOMPSON S.                The GeneXpert MRSA Assay (FDA     approved for NASAL specimens     only), is one component of a     comprehensive MRSA colonization     surveillance program. It is not     intended to diagnose MRSA     infection nor to guide or     monitor treatment for     MRSA infections.     Labs: Basic Metabolic Panel:  Recent Labs Lab 07/24/12 1043 07/25/12 0524 07/26/12 0415  NA 139 132* 136  K 3.3* 3.6 3.8  CL 97 99 102  CO2 28 25 28   GLUCOSE 138* 105* 97  BUN 16 12 6   CREATININE 0.61 0.54 0.55  CALCIUM 9.2 7.9* 8.1*   Liver Function Tests:  Recent Labs Lab 07/24/12 1043  AST 26  ALT 21  ALKPHOS 76  BILITOT 0.9  PROT 7.4  ALBUMIN 3.7    Recent Labs Lab 07/24/12 1043  LIPASE 12   No results found for this basename: AMMONIA,  in the last 168 hours CBC:  Recent Labs Lab 07/24/12 1043  07/25/12 1400  07/25/12 2124 07/26/12 0415 07/26/12 0801 07/27/12 0534  WBC 26.3*  < > 16.4* 12.8* 9.0 9.6 7.8  NEUTROABS 24.7*  --   --   --   --   --   --   HGB 14.7  < > 13.0 12.0* 11.5* 11.7* 11.5*  HCT 42.5  < > 38.4* 35.6* 34.5* 34.8* 33.9*  MCV 85.7  < > 87.3 87.3 87.1 86.8 86.3  PLT 256  < > 230 206 212 201 206  < > = values in this interval not displayed. Cardiac Enzymes: No results found for this basename: CKTOTAL, CKMB, CKMBINDEX, TROPONINI,  in the last 168 hours BNP: BNP (last 3 results) No results found for this basename: PROBNP,  in the last 8760 hours CBG: No results found for this basename: GLUCAP,  in the last 168 hours     Signed:  Avey Mcmanamon  [facility] 07/27/2012, 11:37 AM

## 2012-07-27 NOTE — Progress Notes (Signed)
D/c instructions reviewed with patient.  Verbalized understanding.  Pt dc'd to home with friend.  Schonewitz, [name] 07/27/2012

## 2012-07-27 NOTE — Telephone Encounter (Signed)
Just discharged today, needs hospital f/u secondary to anemia, melena, EGD during admission showed MW tear. 4-6 weeks, non-urgent.

## 2012-07-28 NOTE — Progress Notes (Signed)
REVIEWED.  

## 2012-07-29 LAB — CULTURE, BLOOD (ROUTINE X 2): Culture: NO GROWTH

## 2012-07-29 NOTE — Telephone Encounter (Signed)
Pt aware of OV on 7/10 at 11 with AS and appt card was mailed

## 2012-07-30 ENCOUNTER — Encounter (HOSPITAL_COMMUNITY): Payer: Self-pay

## 2012-07-30 ENCOUNTER — Other Ambulatory Visit: Payer: Self-pay

## 2012-07-30 ENCOUNTER — Emergency Department (HOSPITAL_COMMUNITY)
Admission: EM | Admit: 2012-07-30 | Discharge: 2012-07-30 | Disposition: A | Discharge status: Home / Self Care | Payer: Medicare Other | Attending: Emergency Medicine | Admitting: Emergency Medicine

## 2012-07-30 DIAGNOSIS — Z8669 Personal history of other diseases of the nervous system and sense organs: Secondary | ICD-10-CM | POA: Insufficient documentation

## 2012-07-30 DIAGNOSIS — L0390 Cellulitis, unspecified: Secondary | ICD-10-CM

## 2012-07-30 DIAGNOSIS — Z8719 Personal history of other diseases of the digestive system: Secondary | ICD-10-CM | POA: Insufficient documentation

## 2012-07-30 DIAGNOSIS — Z86711 Personal history of pulmonary embolism: Secondary | ICD-10-CM | POA: Insufficient documentation

## 2012-07-30 DIAGNOSIS — Z872 Personal history of diseases of the skin and subcutaneous tissue: Secondary | ICD-10-CM | POA: Insufficient documentation

## 2012-07-30 DIAGNOSIS — Z87828 Personal history of other (healed) physical injury and trauma: Secondary | ICD-10-CM | POA: Insufficient documentation

## 2012-07-30 DIAGNOSIS — I10 Essential (primary) hypertension: Secondary | ICD-10-CM | POA: Insufficient documentation

## 2012-07-30 DIAGNOSIS — L02619 Cutaneous abscess of unspecified foot: Secondary | ICD-10-CM | POA: Insufficient documentation

## 2012-07-30 DIAGNOSIS — Z79899 Other long term (current) drug therapy: Secondary | ICD-10-CM | POA: Insufficient documentation

## 2012-07-30 DIAGNOSIS — Z9889 Other specified postprocedural states: Secondary | ICD-10-CM | POA: Insufficient documentation

## 2012-07-30 DIAGNOSIS — L03119 Cellulitis of unspecified part of limb: Secondary | ICD-10-CM | POA: Insufficient documentation

## 2012-07-30 DIAGNOSIS — X58XXXA Exposure to other specified factors, initial encounter: Secondary | ICD-10-CM | POA: Insufficient documentation

## 2012-07-30 DIAGNOSIS — R42 Dizziness and giddiness: Secondary | ICD-10-CM | POA: Insufficient documentation

## 2012-07-30 DIAGNOSIS — Y9389 Activity, other specified: Secondary | ICD-10-CM | POA: Insufficient documentation

## 2012-07-30 DIAGNOSIS — F17200 Nicotine dependence, unspecified, uncomplicated: Secondary | ICD-10-CM | POA: Insufficient documentation

## 2012-07-30 DIAGNOSIS — K219 Gastro-esophageal reflux disease without esophagitis: Secondary | ICD-10-CM | POA: Insufficient documentation

## 2012-07-30 DIAGNOSIS — Z87440 Personal history of urinary (tract) infections: Secondary | ICD-10-CM | POA: Insufficient documentation

## 2012-07-30 DIAGNOSIS — T148XXA Other injury of unspecified body region, initial encounter: Secondary | ICD-10-CM

## 2012-07-30 DIAGNOSIS — Y929 Unspecified place or not applicable: Secondary | ICD-10-CM | POA: Insufficient documentation

## 2012-07-30 DIAGNOSIS — IMO0002 Reserved for concepts with insufficient information to code with codable children: Secondary | ICD-10-CM | POA: Insufficient documentation

## 2012-07-30 DIAGNOSIS — E785 Hyperlipidemia, unspecified: Secondary | ICD-10-CM | POA: Insufficient documentation

## 2012-07-30 LAB — CBC WITH DIFFERENTIAL/PLATELET
Eosinophils Absolute: 0.1 10*3/uL (ref 0.0–0.7)
Eosinophils Relative: 1 % (ref 0–5)
HCT: 35.8 % — ABNORMAL LOW (ref 39.0–52.0)
Hemoglobin: 12.4 g/dL — ABNORMAL LOW (ref 13.0–17.0)
Lymphocytes Relative: 21 % (ref 12–46)
Lymphs Abs: 2 10*3/uL (ref 0.7–4.0)
MCH: 28.9 pg (ref 26.0–34.0)
MCV: 83.4 fL (ref 78.0–100.0)
Monocytes Absolute: 0.8 10*3/uL (ref 0.1–1.0)
Monocytes Relative: 9 % (ref 3–12)
Platelets: 359 10*3/uL (ref 150–400)
RBC: 4.29 MIL/uL (ref 4.22–5.81)
WBC: 9.4 10*3/uL (ref 4.0–10.5)

## 2012-07-30 LAB — COMPREHENSIVE METABOLIC PANEL
ALT: 47 U/L (ref 0–53)
BUN: 10 mg/dL (ref 6–23)
CO2: 25 mEq/L (ref 19–32)
Calcium: 8.7 mg/dL (ref 8.4–10.5)
GFR calc Af Amer: >90 mL/min (ref >90)
GFR calc non Af Amer: >90 mL/min (ref >90)
Glucose, Bld: 141 mg/dL — ABNORMAL HIGH (ref 70–99)
Sodium: 140 mEq/L (ref 135–145)

## 2012-07-30 LAB — URINALYSIS, ROUTINE W REFLEX MICROSCOPIC
Bilirubin Urine: NEGATIVE
Hgb urine dipstick: NEGATIVE
Nitrite: NEGATIVE
Protein, ur: NEGATIVE mg/dL
Urobilinogen, UA: 0.2 mg/dL (ref 0.0–1.0)

## 2012-07-30 LAB — LIPASE, BLOOD: Lipase: 21 U/L (ref 11–59)

## 2012-07-30 MED ORDER — POTASSIUM CHLORIDE CRYS ER 20 MEQ PO TBCR
40.0000 meq | EXTENDED_RELEASE_TABLET | Freq: Once | ORAL | Status: AC
  Administered 2012-07-30: 40 meq via ORAL
  Filled 2012-07-30: qty 2

## 2012-07-30 MED ORDER — SILVER SULFADIAZINE 1 % EX CREA
TOPICAL_CREAM | Freq: Once | CUTANEOUS | Status: AC
  Administered 2012-07-30: 22:00:00 via TOPICAL
  Filled 2012-07-30: qty 50

## 2012-07-30 MED ORDER — SILVER SULFADIAZINE 1 % EX CREA: TOPICAL_CREAM | Freq: Two times a day (BID) | CUTANEOUS | Status: DC

## 2012-07-30 NOTE — ED Notes (Signed)
Pt reports previous hx hiatal hernia esoph erosion by GI specialist and sent home with antibiotic. Since then pt has not been feeling well. Pt reports dizziness starting 5pm today. Pt denies n/v, SOB or chest pain. Increase swelling noted to L foot.
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
## 2012-08-01 NOTE — ED Provider Notes (Signed)
Medical screening examination/treatment/procedure(s) were performed by non-physician practitioner and as supervising physician I was immediately available for consultation/collaboration.  Aaleigha Bozza R. Camyla Camposano, MD 08/01/12 2323 

## 2012-08-02 ENCOUNTER — Ambulatory Visit (INDEPENDENT_AMBULATORY_CARE_PROVIDER_SITE_OTHER): Payer: Medicare Other | Admitting: Physician Assistant

## 2012-08-02 ENCOUNTER — Encounter: Payer: Self-pay | Admitting: Physician Assistant

## 2012-08-02 VITALS — BP 144/77 | HR 89 | Temp 97.9°F | Wt 150.0 lb

## 2012-08-02 DIAGNOSIS — L0291 Cutaneous abscess, unspecified: Secondary | ICD-10-CM

## 2012-08-02 DIAGNOSIS — L0390 Cellulitis, unspecified: Secondary | ICD-10-CM

## 2012-08-02 MED ORDER — DOXYCYCLINE HYCLATE 100 MG PO CAPS: 100.0000 mg | ORAL_CAPSULE | Freq: Two times a day (BID) | ORAL | Status: DC

## 2012-08-02 NOTE — Progress Notes (Signed)
Subjective:     Patient ID: Dustin Medina, male   DOB: 13-Sep-1950, 62 y.o.   MRN: [id number]  HPI Pt with multiple visits for cellulitis to the L lower leg He has been to [facility] and [facility] for same He was placed on Doxycycline and has done better Pt informed culture were MRSA and to continue med He sates sx are much better but needs rf of med   Review of Systems  All other systems reviewed and are negative.       Objective:   Physical Exam  Nursing note and vitals reviewed. Pt with healing lesion to the ant L tib Minimal edema to the lower leg No active drainage Cont edema to the foot Healing angulated lesion to the L midfoot Area is granulating in well Still with large bullous formation to superior aspect Still with some erythema at the toes     Assessment:     Cellulitis    Plan:     Cont Doxycycline- rf done today Elevation F/U with regular provider in 2 weeks sooner if sx change/worsen

## 2012-08-02 NOTE — Patient Instructions (Signed)
Cellulitis Cellulitis is an infection of the skin and the tissue beneath it. The infected area is usually red and tender. Cellulitis occurs most often in the arms and lower legs.  CAUSES  Cellulitis is caused by bacteria that enter the skin through cracks or cuts in the skin. The most common types of bacteria that cause cellulitis are Staphylococcus and Streptococcus. SYMPTOMS   Redness and warmth.  Swelling.  Tenderness or pain.  Fever. DIAGNOSIS  Your caregiver can usually determine what is wrong based on a physical exam. Blood tests may also be done. TREATMENT  Treatment usually involves taking an antibiotic medicine. HOME CARE INSTRUCTIONS   Take your antibiotics as directed. Finish them even if you start to feel better.  Keep the infected arm or leg elevated to reduce swelling.  Apply a warm cloth to the affected area up to 4 times per day to relieve pain.  Only take over-the-counter or prescription medicines for pain, discomfort, or fever as directed by your caregiver.  Keep all follow-up appointments as directed by your caregiver. SEEK MEDICAL CARE IF:   You notice red streaks coming from the infected area.  Your red area gets larger or turns dark in color.  Your bone or joint underneath the infected area becomes painful after the skin has healed.  Your infection returns in the same area or another area.  You notice a swollen bump in the infected area.  You develop new symptoms. SEEK IMMEDIATE MEDICAL CARE IF:   You have a fever.  You feel very sleepy.  You develop vomiting or diarrhea.  You have a general ill feeling (malaise) with muscle aches and pains. MAKE SURE YOU:   Understand these instructions.  Will watch your condition.  Will get help right away if you are not doing well or get worse. Document Released: 11/13/2004 Document Revised: 08/05/2011 Document Reviewed: 04/21/2011 [employer] Patient Information 2014 [employer].  

## 2012-08-12 ENCOUNTER — Telehealth: Payer: Self-pay | Admitting: Physician Assistant

## 2012-08-23 ENCOUNTER — Encounter (HOSPITAL_BASED_OUTPATIENT_CLINIC_OR_DEPARTMENT_OTHER): Payer: Medicare Other | Attending: General Surgery

## 2012-08-23 DIAGNOSIS — L8993 Pressure ulcer of unspecified site, stage 3: Secondary | ICD-10-CM | POA: Insufficient documentation

## 2012-08-23 DIAGNOSIS — L89109 Pressure ulcer of unspecified part of back, unspecified stage: Secondary | ICD-10-CM | POA: Insufficient documentation

## 2012-08-23 DIAGNOSIS — L89899 Pressure ulcer of other site, unspecified stage: Secondary | ICD-10-CM | POA: Insufficient documentation

## 2012-08-25 ENCOUNTER — Encounter: Payer: Self-pay | Admitting: Internal Medicine

## 2012-08-26 ENCOUNTER — Telehealth: Payer: Self-pay | Admitting: Gastroenterology

## 2012-08-26 ENCOUNTER — Ambulatory Visit: Payer: Medicare Other | Admitting: Gastroenterology

## 2012-08-26 NOTE — Telephone Encounter (Signed)
Please send note to f/u.

## 2012-08-26 NOTE — Telephone Encounter (Signed)
Pt was a no show

## 2012-09-01 ENCOUNTER — Encounter: Payer: Self-pay | Admitting: General Practice

## 2012-09-01 NOTE — Telephone Encounter (Signed)
Letter mailed

## 2012-09-04 ENCOUNTER — Other Ambulatory Visit: Payer: Self-pay | Admitting: Family Medicine

## 2012-09-08 ENCOUNTER — Telehealth: Payer: Self-pay | Admitting: Family Medicine

## 2012-09-08 NOTE — Telephone Encounter (Signed)
[facility] called and said they do not have testesterone medication in strength you ordered( I can't find rx) but they said you ordered 100mg /29ml and they only have 200mg /ml and they want to alter rx to1/76ml, I told them I would ask you.  Can you please fix this for me?

## 2012-09-09 ENCOUNTER — Other Ambulatory Visit: Payer: Self-pay | Admitting: Family Medicine

## 2012-09-09 ENCOUNTER — Telehealth: Payer: Self-pay | Admitting: Family Medicine

## 2012-09-09 DIAGNOSIS — E291 Testicular hypofunction: Secondary | ICD-10-CM

## 2012-09-09 MED ORDER — TESTOSTERONE CYPIONATE 200 MG/ML IM SOLN: 100.0000 mg | INTRAMUSCULAR | Status: DC

## 2012-09-09 NOTE — Telephone Encounter (Signed)
Left message for pt that rx for testesterone has been clarified and called to [facility]

## 2012-09-09 NOTE — Telephone Encounter (Signed)
I ordered in EPIC. Please call in. Thanks.

## 2012-09-09 NOTE — Telephone Encounter (Signed)
Spoke to [place] at [facility] and clarification on testesterone given.  Pt aware

## 2012-09-16 ENCOUNTER — Other Ambulatory Visit (INDEPENDENT_AMBULATORY_CARE_PROVIDER_SITE_OTHER): Payer: Medicare Other

## 2012-09-16 ENCOUNTER — Other Ambulatory Visit: Payer: Self-pay | Admitting: *Deleted

## 2012-09-16 DIAGNOSIS — G8220 Paraplegia, unspecified: Secondary | ICD-10-CM

## 2012-09-16 DIAGNOSIS — L89209 Pressure ulcer of unspecified hip, unspecified stage: Secondary | ICD-10-CM

## 2012-09-16 DIAGNOSIS — L8992 Pressure ulcer of unspecified site, stage 2: Secondary | ICD-10-CM

## 2012-09-16 DIAGNOSIS — L89109 Pressure ulcer of unspecified part of back, unspecified stage: Secondary | ICD-10-CM

## 2012-09-16 DIAGNOSIS — N390 Urinary tract infection, site not specified: Secondary | ICD-10-CM

## 2012-09-16 LAB — POCT URINALYSIS DIPSTICK
Bilirubin, UA: NEGATIVE
Glucose, UA: NEGATIVE
Ketones, UA: NEGATIVE
Nitrite, UA: NEGATIVE
Protein, UA: NEGATIVE
Spec Grav, UA: 1.01
Urobilinogen, UA: NEGATIVE
pH, UA: 7

## 2012-09-16 LAB — POCT UA - MICROSCOPIC ONLY
Bacteria, U Microscopic: NEGATIVE
Casts, Ur, LPF, POC: NEGATIVE
Crystals, Ur, HPF, POC: NEGATIVE

## 2012-09-16 NOTE — Progress Notes (Signed)
Pt ask that his urine be tested here in our office and results be sent to [facility]- dr Jacquelyne Balint. Due to dysuria yesterday and today. Private area feels "swollen". Dr Modesto Charon to order.

## 2012-09-20 ENCOUNTER — Encounter (HOSPITAL_BASED_OUTPATIENT_CLINIC_OR_DEPARTMENT_OTHER): Payer: Medicare Other | Attending: General Surgery

## 2012-09-20 ENCOUNTER — Telehealth: Payer: Self-pay | Admitting: Family Medicine

## 2012-09-20 DIAGNOSIS — L89899 Pressure ulcer of other site, unspecified stage: Secondary | ICD-10-CM | POA: Insufficient documentation

## 2012-09-20 DIAGNOSIS — G8220 Paraplegia, unspecified: Secondary | ICD-10-CM | POA: Insufficient documentation

## 2012-09-20 DIAGNOSIS — L8993 Pressure ulcer of unspecified site, stage 3: Secondary | ICD-10-CM | POA: Insufficient documentation

## 2012-09-20 DIAGNOSIS — L89109 Pressure ulcer of unspecified part of back, unspecified stage: Secondary | ICD-10-CM | POA: Insufficient documentation

## 2012-09-20 NOTE — Telephone Encounter (Signed)
Pt aware of urine results and did see Dr Roselee Nova today

## 2012-10-20 ENCOUNTER — Ambulatory Visit (INDEPENDENT_AMBULATORY_CARE_PROVIDER_SITE_OTHER): Payer: Medicare Other | Admitting: General Practice

## 2012-10-20 ENCOUNTER — Other Ambulatory Visit: Payer: Self-pay | Admitting: Family Medicine

## 2012-10-20 VITALS — BP 142/77 | HR 109 | Temp 98.2°F

## 2012-10-20 DIAGNOSIS — R109 Unspecified abdominal pain: Secondary | ICD-10-CM

## 2012-10-20 DIAGNOSIS — N390 Urinary tract infection, site not specified: Secondary | ICD-10-CM

## 2012-10-20 DIAGNOSIS — R52 Pain, unspecified: Secondary | ICD-10-CM

## 2012-10-20 LAB — POCT UA - MICROSCOPIC ONLY: Yeast, UA: NEGATIVE

## 2012-10-20 LAB — POCT URINALYSIS DIPSTICK: Glucose, UA: NEGATIVE

## 2012-10-20 MED ORDER — CIPROFLOXACIN HCL 500 MG PO TABS: 500.0000 mg | ORAL_TABLET | Freq: Two times a day (BID) | ORAL | Status: DC

## 2012-10-20 NOTE — Progress Notes (Signed)
  Subjective:    Patient ID: Dustin Medina, male    DOB: 27-Jun-1950, 62 y.o.   MRN: [id number]  HPI Patient presents today with complaints of flank pain. He reports pain has been present for past three days, but hasn't worsened. He is paraplegic and performs self catheterizations. He denies foul odor, but darker than normal. He denies OTC medications. Reports feeling lightheaded once this morning.     Review of Systems  Constitutional: Negative for fever and chills.  Respiratory: Negative for chest tightness and shortness of breath.   Cardiovascular: Negative for chest pain.  Gastrointestinal: Negative for abdominal pain.  Genitourinary: Positive for flank pain. Negative for hematuria.  Neurological: Positive for light-headedness. Negative for dizziness, weakness and headaches.       Objective:   Physical Exam  Constitutional: He is oriented to person, place, and time. He appears well-developed and well-nourished.  Cardiovascular: Regular rhythm and normal heart sounds.  Tachycardia present.   Pulmonary/Chest: Effort normal and breath sounds normal.  Abdominal: Soft. Bowel sounds are normal. He exhibits no distension.  Neurological: He is alert and oriented to person, place, and time.  Skin: Skin is warm and dry. No rash noted.  Psychiatric: He has a normal mood and affect.   Results for orders placed in visit on 10/20/12  POCT UA - MICROSCOPIC ONLY      Result Value Range   WBC, Ur, HPF, POC occ     RBC, urine, microscopic 10-15     Bacteria, U Microscopic occ     Mucus, UA trace     Epithelial cells, urine per micros neg     Crystals, Ur, HPF, POC neg     Casts, Ur, LPF, POC occ     Yeast, UA neg    POCT URINALYSIS DIPSTICK      Result Value Range   Color, UA orange     Clarity, UA clear     Glucose, UA neg     Bilirubin, UA neg     Ketones, UA neg     Spec Grav, UA       Blood, UA       pH, UA       Protein, UA       Urobilinogen, UA       Nitrite, UA       Leukocytes, UA              Assessment & Plan:  1. Flank pain, acute - POCT UA - Microscopic Only - POCT urinalysis dipstick  2. UTI (urinary tract infection) - ciprofloxacin (CIPRO) 500 MG tablet; Take 1 tablet (500 mg total) by mouth 2 (two) times daily.  Dispense: 20 tablet; Refill: 0 -Increase fluid intake Proper aseptic technique with self catheterization Proper perineal hygiene RTO if symptoms worsen or unresolved, may seek emergency medical treatment Patient verbalized understanding Coralie Keens, FNP-C

## 2012-10-21 NOTE — Telephone Encounter (Signed)
Last lipids 09/09/11  Last seen 04/30/12  [facility]

## 2012-10-25 ENCOUNTER — Encounter (HOSPITAL_BASED_OUTPATIENT_CLINIC_OR_DEPARTMENT_OTHER): Payer: Medicare Other | Attending: General Surgery

## 2012-10-25 DIAGNOSIS — L8992 Pressure ulcer of unspecified site, stage 2: Secondary | ICD-10-CM | POA: Insufficient documentation

## 2012-10-25 DIAGNOSIS — L89899 Pressure ulcer of other site, unspecified stage: Secondary | ICD-10-CM | POA: Insufficient documentation

## 2012-10-25 DIAGNOSIS — L8993 Pressure ulcer of unspecified site, stage 3: Secondary | ICD-10-CM | POA: Insufficient documentation

## 2012-10-25 DIAGNOSIS — L89109 Pressure ulcer of unspecified part of back, unspecified stage: Secondary | ICD-10-CM | POA: Insufficient documentation

## 2012-11-16 NOTE — Addendum Note (Signed)
Addended by: Philomena Doheny E on: 11/16/2012 06:30 PM   Modules accepted: Level of Service

## 2012-11-22 ENCOUNTER — Encounter (HOSPITAL_BASED_OUTPATIENT_CLINIC_OR_DEPARTMENT_OTHER): Payer: Medicare Other | Attending: General Surgery

## 2012-11-22 DIAGNOSIS — L8993 Pressure ulcer of unspecified site, stage 3: Secondary | ICD-10-CM | POA: Insufficient documentation

## 2012-11-22 DIAGNOSIS — L89109 Pressure ulcer of unspecified part of back, unspecified stage: Secondary | ICD-10-CM | POA: Insufficient documentation

## 2012-11-24 ENCOUNTER — Other Ambulatory Visit: Payer: Self-pay | Admitting: Family Medicine

## 2012-11-29 ENCOUNTER — Ambulatory Visit (INDEPENDENT_AMBULATORY_CARE_PROVIDER_SITE_OTHER): Payer: Medicare Other | Admitting: Family Medicine

## 2012-11-29 ENCOUNTER — Encounter: Payer: Self-pay | Admitting: Family Medicine

## 2012-11-29 VITALS — BP 170/78 | HR 71 | Temp 97.3°F

## 2012-11-29 DIAGNOSIS — E785 Hyperlipidemia, unspecified: Secondary | ICD-10-CM

## 2012-11-29 DIAGNOSIS — K219 Gastro-esophageal reflux disease without esophagitis: Secondary | ICD-10-CM

## 2012-11-29 DIAGNOSIS — G8220 Paraplegia, unspecified: Secondary | ICD-10-CM

## 2012-11-29 DIAGNOSIS — I10 Essential (primary) hypertension: Secondary | ICD-10-CM

## 2012-11-29 MED ORDER — VALSARTAN-HYDROCHLOROTHIAZIDE 160-12.5 MG PO TABS: ORAL_TABLET | ORAL | Status: DC

## 2012-11-29 NOTE — Patient Instructions (Addendum)
      Dr Woodroe Mode Recommendations  For nutrition information, I recommend books:  1).Eat to Live by Dr Monico Hoar. 2).Prevent and Reverse Heart Disease by Dr Suzzette Righter.     DASH Diet The DASH diet stands for "Dietary Approaches to Stop Hypertension." It is a healthy eating plan that has been shown to reduce high blood pressure (hypertension) in as little as 14 days, while also possibly providing other significant health benefits. These other health benefits include reducing the risk of breast cancer after menopause and reducing the risk of type 2 diabetes, heart disease, colon cancer, and stroke. Health benefits also include weight loss and slowing kidney failure in patients with chronic kidney disease.  DIET GUIDELINES  Limit salt (sodium). Your diet should contain less than 1500 mg of sodium daily.  Limit refined or processed carbohydrates. Your diet should include mostly whole grains. Desserts and added sugars should be used sparingly.  Include small amounts of heart-healthy fats. These types of fats include nuts, oils, and tub margarine. Limit saturated and trans fats. These fats have been shown to be harmful in the body. CHOOSING FOODS  The following food groups are based on a 2000 calorie diet. See your Registered Dietitian for individual calorie needs. Grains and Grain Products (6 to 8 servings daily)  Eat More Often: Whole-wheat bread, brown rice, whole-grain or wheat pasta, quinoa, popcorn without added fat or salt (air popped).  Eat Less Often: White bread, white pasta, white rice, cornbread. Vegetables (4 to 5 servings daily)  Eat More Often: Fresh, frozen, and canned vegetables. Vegetables may be raw, steamed, roasted, or grilled with a minimal amount of fat.  Eat Less Often/Avoid: Creamed or fried vegetables. Vegetables in a cheese sauce. Fruit (4 to 5 servings daily)  Eat More Often: All fresh, canned (in natural juice), or frozen fruits. Dried fruits  without added sugar. One hundred percent fruit juice ( cup [237 mL] daily).  Eat Less Often: Dried fruits with added sugar. Canned fruit in light or heavy syrup. [employer], Fish, and Poultry (2 servings or less daily. One serving is 3 to 4 oz [85-114 g]).  Eat More Often: Ninety percent or leaner ground beef, tenderloin, sirloin. Round cuts of beef, chicken breast, [place] breast. All fish. Grill, bake, or broil your meat. Nothing should be fried.  Eat Less Often/Avoid: Fatty cuts of meat, [place], or chicken leg, thigh, or wing. Fried cuts of meat or fish. Dairy (2 to 3 servings)  Eat More Often: Low-fat or fat-free milk, low-fat plain or light yogurt, reduced-fat or part-skim cheese.  Eat Less Often/Avoid: Milk (whole, 2%).Whole milk yogurt. Full-fat cheeses. Nuts, Seeds, and Legumes (4 to 5 servings per week)  Eat More Often: All without added salt.  Eat Less Often/Avoid: Salted nuts and seeds, canned beans with added salt. Fats and Sweets (limited)  Eat More Often: Vegetable oils, tub margarines without trans fats, sugar-free gelatin. Mayonnaise and salad dressings.  Eat Less Often/Avoid: Coconut oils, palm oils, butter, stick margarine, cream, half and half, cookies, candy, pie. FOR MORE INFORMATION The Dash Diet Eating Plan: www.dashdiet.org Document Released: 01/23/2011 Document Revised: 04/28/2011 Document Reviewed: 01/23/2011 [facility] Patient Information 2014 [place], [place].
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
## 2012-11-30 LAB — CMP14+EGFR
ALT: 23 IU/L (ref 0–44)
AST: 22 IU/L (ref 0–40)
Albumin/Globulin Ratio: 1.7 (ref 1.1–2.5)
Albumin: 4 g/dL (ref 3.6–4.8)
Alkaline Phosphatase: 70 IU/L (ref 39–117)
BUN/Creatinine Ratio: 21 (ref 10–22)
BUN: 10 mg/dL (ref 8–27)
CO2: 25 mmol/L (ref 18–29)
Calcium: 9.3 mg/dL (ref 8.6–10.2)
Chloride: 103 mmol/L (ref 97–108)
Creatinine, Ser: 0.47 mg/dL — ABNORMAL LOW (ref 0.76–1.27)
GFR calc Af Amer: 139 mL/min/{1.73_m2} (ref >59)
GFR calc non Af Amer: 120 mL/min/{1.73_m2} (ref >59)
Globulin, Total: 2.3 g/dL (ref 1.5–4.5)
Glucose: 94 mg/dL (ref 65–99)
Potassium: 5.1 mmol/L (ref 3.5–5.2)
Sodium: 142 mmol/L (ref 134–144)
Total Bilirubin: 0.3 mg/dL (ref 0.0–1.2)
Total Protein: 6.3 g/dL (ref 6.0–8.5)

## 2012-11-30 LAB — LIPID PANEL
Chol/HDL Ratio: 3.7 ratio units (ref 0.0–5.0)
Cholesterol, Total: 128 mg/dL (ref 100–199)
HDL: 35 mg/dL — ABNORMAL LOW (ref >39)
LDL Calculated: 72 mg/dL (ref 0–99)
Triglycerides: 105 mg/dL (ref 0–149)
VLDL Cholesterol Cal: 21 mg/dL (ref 5–40)

## 2012-12-13 ENCOUNTER — Ambulatory Visit (INDEPENDENT_AMBULATORY_CARE_PROVIDER_SITE_OTHER): Payer: Medicare Other | Admitting: Family Medicine

## 2012-12-13 ENCOUNTER — Encounter (INDEPENDENT_AMBULATORY_CARE_PROVIDER_SITE_OTHER): Payer: Self-pay

## 2012-12-13 ENCOUNTER — Encounter: Payer: Self-pay | Admitting: Family Medicine

## 2012-12-13 VITALS — BP 146/81 | HR 71 | Temp 97.7°F

## 2012-12-13 DIAGNOSIS — Z0130 Encounter for examination of blood pressure without abnormal findings: Secondary | ICD-10-CM | POA: Insufficient documentation

## 2012-12-13 DIAGNOSIS — Z136 Encounter for screening for cardiovascular disorders: Secondary | ICD-10-CM

## 2012-12-13 NOTE — Progress Notes (Signed)
bp WNL ands asdvised to continue present treatment and follow up as scheduled

## 2012-12-13 NOTE — Progress Notes (Signed)
Patient ID: Dustin Medina, male   DOB: Jan 18, 1951, 62 y.o.   MRN: [id number] BP check with nurse.  BP 146/81  Pulse 71  Temp(Src) 97.7 F (36.5 C)  BP check  Plan No change in medications and follow up.  Ledora Delker P. [place] Charon, M.D.

## 2012-12-13 NOTE — Patient Instructions (Signed)
Keep follow up as scheduled.

## 2012-12-20 ENCOUNTER — Encounter (HOSPITAL_BASED_OUTPATIENT_CLINIC_OR_DEPARTMENT_OTHER): Payer: Medicare Other | Attending: General Surgery

## 2012-12-20 DIAGNOSIS — L8993 Pressure ulcer of unspecified site, stage 3: Secondary | ICD-10-CM | POA: Insufficient documentation

## 2012-12-20 DIAGNOSIS — L89109 Pressure ulcer of unspecified part of back, unspecified stage: Secondary | ICD-10-CM | POA: Insufficient documentation

## 2013-01-17 ENCOUNTER — Encounter (HOSPITAL_BASED_OUTPATIENT_CLINIC_OR_DEPARTMENT_OTHER): Payer: Medicare Other | Attending: General Surgery

## 2013-01-17 DIAGNOSIS — L8993 Pressure ulcer of unspecified site, stage 3: Secondary | ICD-10-CM | POA: Insufficient documentation

## 2013-01-17 DIAGNOSIS — L89109 Pressure ulcer of unspecified part of back, unspecified stage: Secondary | ICD-10-CM | POA: Insufficient documentation

## 2013-02-14 ENCOUNTER — Other Ambulatory Visit: Payer: Self-pay | Admitting: Family Medicine

## 2013-03-03 ENCOUNTER — Encounter: Payer: Self-pay | Admitting: Family Medicine

## 2013-03-03 ENCOUNTER — Ambulatory Visit (INDEPENDENT_AMBULATORY_CARE_PROVIDER_SITE_OTHER): Payer: Medicare HMO | Admitting: Family Medicine

## 2013-03-03 VITALS — BP 141/84 | HR 77 | Temp 97.1°F

## 2013-03-03 DIAGNOSIS — L89899 Pressure ulcer of other site, unspecified stage: Secondary | ICD-10-CM

## 2013-03-03 DIAGNOSIS — G8220 Paraplegia, unspecified: Secondary | ICD-10-CM

## 2013-03-03 DIAGNOSIS — L8990 Pressure ulcer of unspecified site, unspecified stage: Secondary | ICD-10-CM

## 2013-03-03 DIAGNOSIS — K219 Gastro-esophageal reflux disease without esophagitis: Secondary | ICD-10-CM

## 2013-03-03 DIAGNOSIS — F17200 Nicotine dependence, unspecified, uncomplicated: Secondary | ICD-10-CM | POA: Insufficient documentation

## 2013-03-03 DIAGNOSIS — E785 Hyperlipidemia, unspecified: Secondary | ICD-10-CM

## 2013-03-03 DIAGNOSIS — L89109 Pressure ulcer of unspecified part of back, unspecified stage: Secondary | ICD-10-CM

## 2013-03-03 DIAGNOSIS — K222 Esophageal obstruction: Secondary | ICD-10-CM

## 2013-03-03 DIAGNOSIS — I10 Essential (primary) hypertension: Secondary | ICD-10-CM
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
## 2013-03-03 NOTE — Patient Instructions (Signed)
Smoking Cessation Quitting smoking is important to your health and has many advantages. However, it is not always easy to quit since nicotine is a very addictive drug. Often times, people try 3 times or more before being able to quit. This document explains the best ways for you to prepare to quit smoking. Quitting takes hard work and a lot of effort, but you can do it. ADVANTAGES OF QUITTING SMOKING  You will live longer, feel better, and live better.  Your body will feel the impact of quitting smoking almost immediately.  Within 20 minutes, blood pressure decreases. Your pulse returns to its normal level.  After 8 hours, carbon monoxide levels in the blood return to normal. Your oxygen level increases.  After 24 hours, the chance of having a heart attack starts to decrease. Your breath, hair, and body stop smelling like smoke.  After 48 hours, damaged nerve endings begin to recover. Your sense of taste and smell improve.  After 72 hours, the body is virtually free of nicotine. Your bronchial tubes relax and breathing becomes easier.  After 2 to 12 weeks, lungs can hold more air. Exercise becomes easier and circulation improves.  The risk of having a heart attack, stroke, cancer, or lung disease is greatly reduced.  After 1 year, the risk of coronary heart disease is cut in half.  After 5 years, the risk of stroke falls to the same as a nonsmoker.  After 10 years, the risk of lung cancer is cut in half and the risk of other cancers decreases significantly.  After 15 years, the risk of coronary heart disease drops, usually to the level of a nonsmoker.  If you are pregnant, quitting smoking will improve your chances of having a healthy baby.  The people you live with, especially any children, will be healthier.  You will have extra money to spend on things other than cigarettes. QUESTIONS TO THINK ABOUT BEFORE ATTEMPTING TO QUIT You may want to talk about your answers with your  caregiver.  Why do you want to quit?  If you tried to quit in the past, what helped and what did not?  What will be the most difficult situations for you after you quit? How will you plan to handle them?  Who can help you through the tough times? Your family? Friends? A caregiver?  What pleasures do you get from smoking? What ways can you still get pleasure if you quit? Here are some questions to ask your caregiver:  How can you help me to be successful at quitting?  What medicine do you think would be best for me and how should I take it?  What should I do if I need more help?  What is smoking withdrawal like? How can I get information on withdrawal? GET READY  Set a quit date.  Change your environment by getting rid of all cigarettes, ashtrays, matches, and lighters in your home, car, or work. Do not let people smoke in your home.  Review your past attempts to quit. Think about what worked and what did not. GET SUPPORT AND ENCOURAGEMENT You have a better chance of being successful if you have help. You can get support in many ways.  Tell your family, friends, and co-workers that you are going to quit and need their support. Ask them not to smoke around you.  Get individual, group, or telephone counseling and support. Programs are available at local hospitals and health centers. Call your local health department for   information about programs in your area.  Spiritual beliefs and practices may help some smokers quit.  Download a "quit meter" on your computer to keep track of quit statistics, such as how long you have gone without smoking, cigarettes not smoked, and money saved.  Get a self-help book about quitting smoking and staying off of tobacco. [place] yourself from urges to smoke. Talk to someone, go for a walk, or occupy your time with a task.  Change your normal routine. Take a different route to work. Drink tea instead of coffee.  Eat breakfast in a different place.  Reduce your stress. Take a hot bath, exercise, or read a book.  Plan something enjoyable to do every day. Reward yourself for not smoking.  Explore interactive web-based programs that specialize in helping you quit. GET MEDICINE AND USE IT CORRECTLY Medicines can help you stop smoking and decrease the urge to smoke. Combining medicine with the above behavioral methods and support can greatly increase your chances of successfully quitting smoking.  Nicotine replacement therapy helps deliver nicotine to your body without the negative effects and risks of smoking. Nicotine replacement therapy includes nicotine gum, lozenges, inhalers, nasal sprays, and skin patches. Some may be available over-the-counter and others require a prescription.  Antidepressant medicine helps people abstain from smoking, but how this works is unknown. This medicine is available by prescription.  Nicotinic receptor partial agonist medicine simulates the effect of nicotine in your brain. This medicine is available by prescription. Ask your caregiver for advice about which medicines to use and how to use them based on your health history. Your caregiver will tell you what side effects to look out for if you choose to be on a medicine or therapy. Carefully read the information on the package. Do not use any other product containing nicotine while using a nicotine replacement product.  RELAPSE OR DIFFICULT SITUATIONS Most relapses occur within the first 3 months after quitting. Do not be discouraged if you start smoking again. Remember, most people try several times before finally quitting. You may have symptoms of withdrawal because your body is used to nicotine. You may crave cigarettes, be irritable, feel very hungry, cough often, get headaches, or have difficulty concentrating. The withdrawal symptoms are only temporary. They are strongest when you first quit, but they will go away within  10 14 days. To reduce the chances of relapse, try to:  Avoid drinking alcohol. Drinking lowers your chances of successfully quitting.  Reduce the amount of caffeine you consume. Once you quit smoking, the amount of caffeine in your body increases and can give you symptoms, such as a rapid heartbeat, sweating, and anxiety.  Avoid smokers because they can make you want to smoke.  Do not let weight gain distract you. Many smokers will gain weight when they quit, usually less than 10 pounds. Eat a healthy diet and stay active. You can always lose the weight gained after you quit.  Find ways to improve your mood other than smoking. FOR MORE INFORMATION  www.smokefree.gov  Document Released: 01/28/2001 Document Revised: 08/05/2011 Document Reviewed: 05/15/2011 [facility] Patient Information 2014 [place], [place].        Dr Paula Libra Recommendations  For nutrition information, I recommend books:  1).Eat to Live by Dr Excell Seltzer. 2).Prevent and Reverse Heart Disease by Dr Karl Luke. 3) Dr Janene Harvey Book:  Program to Reverse Diabetes  Exercise recommendations are:  If unable to walk, then the patient can  exercise in a chair 3 times a day. By flapping arms like a bird gently and raising legs outwards to the front.  If ambulatory, the patient can go for walks for 30 minutes 3 times a week. Then increase the intensity and duration as tolerated.  Goal is to try to attain exercise frequency to 5 times a week.  If applicable: Best to perform resistance exercises (machines or weights) 2 days a week and cardio type exercises 3 days per week.  

## 2013-03-04 LAB — CMP14+EGFR
ALT: 22 IU/L (ref 0–44)
AST: 18 IU/L (ref 0–40)
Albumin/Globulin Ratio: 2 (ref 1.1–2.5)
Albumin: 4.2 g/dL (ref 3.6–4.8)
Alkaline Phosphatase: 70 IU/L (ref 39–117)
BUN/Creatinine Ratio: 22 (ref 10–22)
BUN: 14 mg/dL (ref 8–27)
CO2: 23 mmol/L (ref 18–29)
Calcium: 9.4 mg/dL (ref 8.6–10.2)
Chloride: 104 mmol/L (ref 97–108)
Creatinine, Ser: 0.63 mg/dL — ABNORMAL LOW (ref 0.76–1.27)
GFR calc Af Amer: 123 mL/min/{1.73_m2} (ref >59)
GFR calc non Af Amer: 106 mL/min/{1.73_m2} (ref >59)
Globulin, Total: 2.1 g/dL (ref 1.5–4.5)
Glucose: 86 mg/dL (ref 65–99)
Potassium: 4.7 mmol/L (ref 3.5–5.2)
Sodium: 142 mmol/L (ref 134–144)
Total Bilirubin: 0.6 mg/dL (ref 0.0–1.2)
Total Protein: 6.3 g/dL (ref 6.0–8.5)

## 2013-03-04 LAB — LIPID PANEL
Chol/HDL Ratio: 3.9 ratio units (ref 0.0–5.0)
Cholesterol, Total: 130 mg/dL (ref 100–199)
HDL: 33 mg/dL — ABNORMAL LOW (ref >39)
LDL Calculated: 65 mg/dL (ref 0–99)
Triglycerides: 159 mg/dL — ABNORMAL HIGH (ref 0–149)
VLDL Cholesterol Cal: 32 mg/dL (ref 5–40)

## 2013-03-07 ENCOUNTER — Encounter (HOSPITAL_BASED_OUTPATIENT_CLINIC_OR_DEPARTMENT_OTHER): Payer: Medicare HMO | Attending: General Surgery

## 2013-03-07 ENCOUNTER — Telehealth: Payer: Self-pay | Admitting: Family Medicine

## 2013-03-07 DIAGNOSIS — L8993 Pressure ulcer of unspecified site, stage 3: Secondary | ICD-10-CM | POA: Insufficient documentation

## 2013-03-07 DIAGNOSIS — L89309 Pressure ulcer of unspecified buttock, unspecified stage: Secondary | ICD-10-CM | POA: Insufficient documentation

## 2013-03-07 DIAGNOSIS — L89109 Pressure ulcer of unspecified part of back, unspecified stage: Secondary | ICD-10-CM | POA: Insufficient documentation

## 2013-03-07 NOTE — Telephone Encounter (Signed)
Message copied by Waverly Ferrari on Mon Mar 07, 2013 10:41 AM ------      Message from: Vernie Shanks      Created: Fri Mar 04, 2013 12:34 PM       Labs results are stable and at or close to goal. Triglycerides a little high from carbohydrates.       No changes in medications.      No change in recommendations.      No change in Follow up ------

## 2013-03-17 ENCOUNTER — Other Ambulatory Visit: Payer: Self-pay | Admitting: Family Medicine

## 2013-03-18 ENCOUNTER — Other Ambulatory Visit: Payer: Self-pay | Admitting: Family Medicine

## 2013-03-27 ENCOUNTER — Other Ambulatory Visit: Payer: Self-pay | Admitting: Family Medicine

## 2013-04-04 ENCOUNTER — Ambulatory Visit (INDEPENDENT_AMBULATORY_CARE_PROVIDER_SITE_OTHER): Payer: Medicare HMO | Admitting: Family Medicine

## 2013-04-04 VITALS — BP 122/74 | HR 93 | Temp 99.1°F

## 2013-04-04 DIAGNOSIS — R509 Fever, unspecified: Secondary | ICD-10-CM

## 2013-04-04 DIAGNOSIS — K5289 Other specified noninfective gastroenteritis and colitis: Secondary | ICD-10-CM

## 2013-04-04 DIAGNOSIS — K529 Noninfective gastroenteritis and colitis, unspecified: Secondary | ICD-10-CM

## 2013-04-04 LAB — POCT INFLUENZA A/B
Influenza A, POC: NEGATIVE
Influenza B, POC: NEGATIVE

## 2013-04-04 MED ORDER — PROMETHAZINE HCL 25 MG PO TABS: 25.0000 mg | ORAL_TABLET | Freq: Three times a day (TID) | ORAL | Status: DC | PRN

## 2013-04-04 NOTE — Progress Notes (Signed)
   Subjective:    Patient ID: Dustin Medina, male    DOB: Jan 18, 1951, 63 y.o.   MRN: [id number]  HPI This 63 y.o. male presents for c/o nausea and vomiting for a day.  He has had aches and pains. He is paraplegic and is in [facility]   Review of Systems No chest pain, SOB, HA, dizziness, vision change, N/V, diarrhea, constipation, dysuria, urinary urgency or frequency, myalgias, arthralgias or rash.     Objective:   Physical Exam Vital signs noted  WC bound male in NAD  HEENT - Head atraumatic Normocephalic                Eyes - PERRLA, Conjuctiva - clear Sclera- Clear EOMI                Ears - EAC's Wnl TM's Wnl Gross Hearing WNL                Nose - Nares patent                 Throat - oropharanx wnl Respiratory - Lungs CTA bilateral Cardiac - RRR S1 and S2 without murmur GI - Abdomen soft Nontender and bowel sounds active x 4        Assessment & Plan:  Fever, unspecified - Plan: POCT Influenza A/B, promethazine (PHENERGAN) 25 MG tablet  Noninfectious gastroenteritis and colitis - Plan: promethazine (PHENERGAN) 25 MG tablet  Lysbeth Penner FNP

## 2013-04-06 ENCOUNTER — Encounter (HOSPITAL_COMMUNITY): Payer: Self-pay | Admitting: Emergency Medicine

## 2013-04-06 ENCOUNTER — Emergency Department (HOSPITAL_COMMUNITY): Payer: Medicare HMO

## 2013-04-06 ENCOUNTER — Inpatient Hospital Stay (HOSPITAL_COMMUNITY)
Admission: EM | Admit: 2013-04-06 | Discharge: 2013-04-09 | DRG: 603 | Disposition: A | Discharge status: Home Health | Payer: Medicare HMO | Attending: Internal Medicine | Admitting: Internal Medicine

## 2013-04-06 DIAGNOSIS — Z86711 Personal history of pulmonary embolism: Secondary | ICD-10-CM

## 2013-04-06 DIAGNOSIS — L03119 Cellulitis of unspecified part of limb: Principal | ICD-10-CM

## 2013-04-06 DIAGNOSIS — G4730 Sleep apnea, unspecified: Secondary | ICD-10-CM | POA: Diagnosis present

## 2013-04-06 DIAGNOSIS — K219 Gastro-esophageal reflux disease without esophagitis: Secondary | ICD-10-CM | POA: Diagnosis present

## 2013-04-06 DIAGNOSIS — L0291 Cutaneous abscess, unspecified: Secondary | ICD-10-CM

## 2013-04-06 DIAGNOSIS — Z86718 Personal history of other venous thrombosis and embolism: Secondary | ICD-10-CM

## 2013-04-06 DIAGNOSIS — R339 Retention of urine, unspecified: Secondary | ICD-10-CM | POA: Diagnosis present

## 2013-04-06 DIAGNOSIS — F17200 Nicotine dependence, unspecified, uncomplicated: Secondary | ICD-10-CM | POA: Diagnosis present

## 2013-04-06 DIAGNOSIS — L02419 Cutaneous abscess of limb, unspecified: Principal | ICD-10-CM | POA: Diagnosis present

## 2013-04-06 DIAGNOSIS — I10 Essential (primary) hypertension: Secondary | ICD-10-CM | POA: Diagnosis present

## 2013-04-06 DIAGNOSIS — L0390 Cellulitis, unspecified: Secondary | ICD-10-CM | POA: Diagnosis present

## 2013-04-06 DIAGNOSIS — E785 Hyperlipidemia, unspecified: Secondary | ICD-10-CM | POA: Diagnosis present

## 2013-04-06 DIAGNOSIS — G8220 Paraplegia, unspecified: Secondary | ICD-10-CM | POA: Diagnosis present

## 2013-04-06 DIAGNOSIS — Z9181 History of falling: Secondary | ICD-10-CM

## 2013-04-06 DIAGNOSIS — Z993 Dependence on wheelchair: Secondary | ICD-10-CM

## 2013-04-06 LAB — URINE MICROSCOPIC-ADD ON

## 2013-04-06 LAB — URINALYSIS, ROUTINE W REFLEX MICROSCOPIC
Glucose, UA: NEGATIVE mg/dL
Ketones, ur: NEGATIVE mg/dL
Leukocytes, UA: NEGATIVE
Nitrite: NEGATIVE
Specific Gravity, Urine: 1.02 (ref 1.005–1.030)
Urobilinogen, UA: 0.2 mg/dL (ref 0.0–1.0)
pH: 6 (ref 5.0–8.0)

## 2013-04-06 LAB — CBC WITH DIFFERENTIAL/PLATELET
BASOS ABS: 0 10*3/uL (ref 0.0–0.1)
Basophils Relative: 0 % (ref 0–1)
EOS ABS: 0 10*3/uL (ref 0.0–0.7)
Eosinophils Relative: 0 % (ref 0–5)
HCT: 40.9 % (ref 39.0–52.0)
Hemoglobin: 14.2 g/dL (ref 13.0–17.0)
Lymphocytes Relative: 13 % (ref 12–46)
Lymphs Abs: 1.2 10*3/uL (ref 0.7–4.0)
MCH: 30.7 pg (ref 26.0–34.0)
MCHC: 34.7 g/dL (ref 30.0–36.0)
MCV: 88.3 fL (ref 78.0–100.0)
Monocytes Absolute: 0.8 10*3/uL (ref 0.1–1.0)
Monocytes Relative: 8 % (ref 3–12)
NEUTROS PCT: 78 % — AB (ref 43–77)
Neutro Abs: 7.5 10*3/uL (ref 1.7–7.7)
PLATELETS: 200 10*3/uL (ref 150–400)
RBC: 4.63 MIL/uL (ref 4.22–5.81)
RDW: 14.4 % (ref 11.5–15.5)
WBC: 9.6 10*3/uL (ref 4.0–10.5)

## 2013-04-06 LAB — COMPREHENSIVE METABOLIC PANEL
ALT: 15 U/L (ref 0–53)
AST: 16 U/L (ref 0–37)
Albumin: 3 g/dL — ABNORMAL LOW (ref 3.5–5.2)
Alkaline Phosphatase: 78 U/L (ref 39–117)
BILIRUBIN TOTAL: 0.8 mg/dL (ref 0.3–1.2)
BUN: 9 mg/dL (ref 6–23)
CHLORIDE: 97 meq/L (ref 96–112)
CO2: 28 meq/L (ref 19–32)
Calcium: 8.8 mg/dL (ref 8.4–10.5)
Creatinine, Ser: 0.53 mg/dL (ref 0.50–1.35)
GFR calc Af Amer: >90 mL/min (ref >90)
Glucose, Bld: 100 mg/dL — ABNORMAL HIGH (ref 70–99)
Potassium: 3.8 mEq/L (ref 3.7–5.3)
Sodium: 137 mEq/L (ref 137–147)
Total Protein: 7.1 g/dL (ref 6.0–8.3)

## 2013-04-06 LAB — MRSA PCR SCREENING: MRSA by PCR: NEGATIVE

## 2013-04-06 MED ORDER — ENOXAPARIN SODIUM 40 MG/0.4ML ~~LOC~~ SOLN
40.0000 mg | SUBCUTANEOUS | Status: DC
  Administered 2013-04-06 – 2013-04-08 (×3): 40 mg via SUBCUTANEOUS
  Filled 2013-04-06 (×3): qty 0.4

## 2013-04-06 MED ORDER — HYDROCHLOROTHIAZIDE 12.5 MG PO CAPS
12.5000 mg | ORAL_CAPSULE | Freq: Every day | ORAL | Status: DC
  Administered 2013-04-06 – 2013-04-09 (×4): 12.5 mg via ORAL
  Filled 2013-04-06 (×4): qty 1

## 2013-04-06 MED ORDER — VANCOMYCIN HCL 10 G IV SOLR
1500.0000 mg | Freq: Two times a day (BID) | INTRAVENOUS | Status: DC
  Administered 2013-04-06 – 2013-04-09 (×6): 1500 mg via INTRAVENOUS
  Filled 2013-04-06 (×10): qty 1500

## 2013-04-06 MED ORDER — ONDANSETRON HCL 4 MG PO TABS: 4.0000 mg | ORAL_TABLET | Freq: Four times a day (QID) | ORAL | Status: DC | PRN

## 2013-04-06 MED ORDER — TESTOSTERONE CYPIONATE 200 MG/ML IM SOLN: 100.0000 mg | INTRAMUSCULAR | Status: DC

## 2013-04-06 MED ORDER — PANTOPRAZOLE SODIUM 40 MG PO TBEC
40.0000 mg | DELAYED_RELEASE_TABLET | Freq: Every day | ORAL | Status: DC
  Administered 2013-04-06 – 2013-04-09 (×4): 40 mg via ORAL
  Filled 2013-04-06 (×4): qty 1

## 2013-04-06 MED ORDER — VANCOMYCIN HCL IN DEXTROSE 1-5 GM/200ML-% IV SOLN
1000.0000 mg | Freq: Once | INTRAVENOUS | Status: AC
  Administered 2013-04-06: 1000 mg via INTRAVENOUS
  Filled 2013-04-06: qty 200

## 2013-04-06 MED ORDER — SODIUM CHLORIDE 0.9 % IV SOLN: INTRAVENOUS | Status: AC

## 2013-04-06 MED ORDER — IRBESARTAN 150 MG PO TABS
150.0000 mg | ORAL_TABLET | Freq: Every day | ORAL | Status: DC
  Administered 2013-04-06 – 2013-04-09 (×4): 150 mg via ORAL
  Filled 2013-04-06 (×4): qty 1

## 2013-04-06 MED ORDER — SODIUM CHLORIDE 0.9 % IV SOLN
INTRAVENOUS | Status: DC
Start: 2013-04-06 — End: 2013-04-06
  Administered 2013-04-06: 16:00:00 via INTRAVENOUS

## 2013-04-06 MED ORDER — TRIMETHOPRIM 100 MG PO TABS
100.0000 mg | ORAL_TABLET | Freq: Every day | ORAL | Status: DC
  Administered 2013-04-06 – 2013-04-09 (×4): 100 mg via ORAL
  Filled 2013-04-06 (×6): qty 1

## 2013-04-06 MED ORDER — SIMVASTATIN 20 MG PO TABS
40.0000 mg | ORAL_TABLET | Freq: Every day | ORAL | Status: DC
  Administered 2013-04-06 – 2013-04-08 (×3): 40 mg via ORAL
  Filled 2013-04-06 (×4): qty 2

## 2013-04-06 MED ORDER — NIACIN ER (ANTIHYPERLIPIDEMIC) 1000 MG PO TBCR: 1000.0000 mg | EXTENDED_RELEASE_TABLET | Freq: Every day | ORAL | Status: DC

## 2013-04-06 MED ORDER — NIACIN ER (ANTIHYPERLIPIDEMIC) 500 MG PO TBCR
1000.0000 mg | EXTENDED_RELEASE_TABLET | Freq: Every day | ORAL | Status: DC
  Administered 2013-04-06 – 2013-04-08 (×3): 1000 mg via ORAL
  Filled 2013-04-06 (×5): qty 2

## 2013-04-06 MED ORDER — ACETAMINOPHEN 325 MG PO TABS
650.0000 mg | ORAL_TABLET | Freq: Four times a day (QID) | ORAL | Status: DC | PRN
  Administered 2013-04-06 – 2013-04-07 (×2): 650 mg via ORAL
  Filled 2013-04-06 (×2): qty 2

## 2013-04-06 MED ORDER — VANCOMYCIN HCL IN DEXTROSE 1-5 GM/200ML-% IV SOLN
1000.0000 mg | Freq: Three times a day (TID) | INTRAVENOUS | Status: DC
  Filled 2013-04-06 (×3): qty 200

## 2013-04-06 MED ORDER — ACETAMINOPHEN 650 MG RE SUPP: 650.0000 mg | Freq: Four times a day (QID) | RECTAL | Status: DC | PRN

## 2013-04-06 MED ORDER — ALUM & MAG HYDROXIDE-SIMETH 200-200-20 MG/5ML PO SUSP: 30.0000 mL | Freq: Four times a day (QID) | ORAL | Status: DC | PRN

## 2013-04-06 MED ORDER — ONDANSETRON HCL 4 MG/2ML IJ SOLN: 4.0000 mg | Freq: Four times a day (QID) | INTRAMUSCULAR | Status: DC | PRN

## 2013-04-06 MED ORDER — VALSARTAN-HYDROCHLOROTHIAZIDE 160-12.5 MG PO TABS: 1.0000 | ORAL_TABLET | Freq: Every day | ORAL | Status: DC

## 2013-04-06 NOTE — ED Notes (Signed)
Lunch tray has not yet arrived. Called to cafeteria. Pt talking on phone, NAD.
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
## 2013-04-06 NOTE — ED Notes (Signed)
Lunch tray provided. 
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
## 2013-04-06 NOTE — ED Notes (Signed)
US currently at bedside

## 2013-04-06 NOTE — ED Notes (Signed)
Pt reports fell out of wheel chair 2 weeks ago and has scabbed area to r shin.  Pt reports for the past few days noticed redness and swelling to R lower leg.  Pt also has small scabbed over wounds on both feet.
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
## 2013-04-06 NOTE — ED Notes (Signed)
Lunch tray ordered. Coffee given

## 2013-04-06 NOTE — ED Notes (Signed)
Admitting NP at bedside
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
## 2013-04-07 DIAGNOSIS — R339 Retention of urine, unspecified: Secondary | ICD-10-CM | POA: Diagnosis present

## 2013-04-07 NOTE — Care Management Note (Signed)
    Page 1 of 1   04/08/2013     2:01:36 PM   CARE MANAGEMENT NOTE 04/08/2013  Patient:  BOLESLAW, BORGHI   Account Number:  [id number]  Date Initiated:  04/07/2013  Documentation initiated by:  Claretha Cooper  Subjective/Objective Assessment:   Pt is paraplegic and lives at home alone. Independent. States he had [facility] in the past and would like for an RN  to resume at discharge.     Action/Plan:   Anticipated DC Date:     Anticipated DC Plan:  [place]  CM consult      [facility] Choice  HOME HEALTH   Choice offered to / List presented to:  C-1 Patient        [place] arranged  HH-1 RN  [place].   Status of service:  Completed, signed off Medicare Important Message given?   (If response is "NO", the following Medicare IM given date fields will be blank) Date Medicare IM given:   Date Additional Medicare IM given:    Discharge Disposition:    Per UR Regulation:    If discussed at Long Length of Stay Meetings, dates discussed:    Comments:  04/07/13 Claretha Cooper RN BSN CM

## 2013-04-07 NOTE — Plan of Care (Signed)
Problem: Phase I Progression Outcomes Goal: Voiding-avoid urinary catheter unless indicated Outcome: Not Applicable Date Met:  51/76/16 Pt. Has to self cath himself
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
## 2013-04-07 NOTE — Progress Notes (Signed)
Patient interviewed and examined. Discussed with Ms. Dyanne Carrel, NP and agree with her progress notes. Reviewed chart in detail. Cellulitis of right leg slowly improving. Patient has some streaking/redness over the medial aspect of right thigh which may be cellulitis versus superficial thrombophlebitis.  Vernell Leep, MD, FACP, FHM. [facility] Pager [phone]  If 7PM-7AM, please contact night-coverage [email or web address] Password [facility] 04/07/2013, 5:39 PM

## 2013-04-08 DIAGNOSIS — F17200 Nicotine dependence, unspecified, uncomplicated: Secondary | ICD-10-CM

## 2013-04-08 LAB — CBC
HCT: 35 % — ABNORMAL LOW (ref 39.0–52.0)
HEMOGLOBIN: 12 g/dL — AB (ref 13.0–17.0)
MCH: 30.3 pg (ref 26.0–34.0)
MCHC: 34.3 g/dL (ref 30.0–36.0)
MCV: 88.4 fL (ref 78.0–100.0)
PLATELETS: 225 10*3/uL (ref 150–400)
RBC: 3.96 MIL/uL — AB (ref 4.22–5.81)
RDW: 13.9 % (ref 11.5–15.5)
WBC: 8.3 10*3/uL (ref 4.0–10.5)

## 2013-04-08 LAB — BASIC METABOLIC PANEL
BUN: 10 mg/dL (ref 6–23)
CALCIUM: 8.6 mg/dL (ref 8.4–10.5)
CO2: 29 mEq/L (ref 19–32)
Chloride: 103 mEq/L (ref 96–112)
Creatinine, Ser: 0.53 mg/dL (ref 0.50–1.35)
GLUCOSE: 114 mg/dL — AB (ref 70–99)
Potassium: 4 mEq/L (ref 3.7–5.3)
SODIUM: 140 meq/L (ref 137–147)
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
## 2013-04-09 MED ORDER — DOXYCYCLINE HYCLATE 100 MG PO TABS
100.0000 mg | ORAL_TABLET | Freq: Two times a day (BID) | ORAL | Status: DC
Start: 2013-04-09 — End: 2013-06-20

## 2013-04-09 MED ORDER — DOXYCYCLINE HYCLATE 100 MG PO TABS: 100.0000 mg | ORAL_TABLET | Freq: Two times a day (BID) | ORAL | Status: DC

## 2013-04-09 NOTE — Discharge Summary (Signed)
Physician Discharge Summary  Dustin Medina JZP:[id number] DOB: 03/27/1950 DOA: 04/06/2013  PCP: Anthoney Harada, MD  Admit date: 04/06/2013 Discharge date: 04/09/2013  Time spent: Less than 30 minutes  Recommendations for Outpatient Follow-up:  1. Dr. Yaakov Guthrie, PCP in 5 days.  Discharge Diagnoses:  Principal Problem:   Cellulitis Active Problems:   HYPERTENSION   GERD   Paraplegia   Tobacco smoker within last 12 months   Urinary retention   Discharge Condition: Improved & Stable  Diet recommendation: Heart Healthy diet.  Filed Weights   04/06/13 1540 04/07/13 0530 04/08/13 0507  Weight: 75 kg (165 lb 5.5 oz) 79.3 kg (174 lb 13.2 oz) 79.5 kg (175 lb 4.3 oz)    History of present illness & Hospital Course:   63 year old pleasant male patient with history of traumatic paraplegia , sensory level below groin, urinary retention-self catheterizes at home, moves around via wheelchair and able to stand using prosthesis, apparently drives, HTN, dyslipidemia, GERD, presented to the ED with complaints of right lower extremity swelling, redness and pain. He sustained a fall off his wheelchair a week prior to admission leading to abration of skin of his right shin. A couple days later he noticed progressively worsening swelling, redness and bowel pain of his right leg. He had associated 2 episodes of nausea and vomiting, chills and fever with MAXIMUM TEMPERATURE 100F. In the ED, blood work was unremarkable, UA was not convincing for UTI, right lower extremity venous Dopplers was negative for DVT and patient was admitted for cellulitis of right leg. He was empirically treated with IV vancomycin of which is on day #4 today. He has clinically done quite well with resolution of pain, warmth and most of swelling and redness in right leg and streaking over medial right thigh. He has faint patchy redness of right leg. He does not look septic or toxic. He will be discharged on oral doxycycline to  complete total one-week treatment. He is advised to followup with his PCP and seek immediate medical attention if there is any decline in his status. He verbalizes understanding. He was continued on his home dose of antihypertensives, PPI and cholesterol medications. He is also on trimethoprim possibly for urinary prophylaxis versus UTI treatment.  Consultations:  None  Procedures:  None    Discharge Exam:  Complaints:  "I'm fine". Denies pain in right leg and states significant improvement in swelling and redness.  Filed Vitals:   04/08/13 0507 04/08/13 1500 04/08/13 2043 04/09/13 0540  BP: 101/55 143/69 142/75 133/68  Pulse: 73 75 69 77  Temp: 98.1 F (36.7 C) 97.8 F (36.6 C) 97.9 F (36.6 C) 98.3 F (36.8 C)  TempSrc:  Oral Oral Oral  Resp: 20 20 20 20   Height:      Weight: 79.5 kg (175 lb 4.3 oz)     SpO2: 98% 97% 96% 95%    General exam: Pleasant middle-aged male sitting up comfortably in bed having breakfast. Respiratory system: Clear. No increased work of breathing. Cardiovascular system: S1 & S2 heard, RRR. No JVD, murmurs, gallops, clicks or pedal edema. Gastrointestinal system: Abdomen is nondistended, soft and nontender. Normal bowel sounds heard. Central nervous system: Alert and oriented. No focal neurological deficits.  Extremities: Symmetric 5 x 5 power. Faint patchy erythema of right foot and leg up knee and a faint patchy erythema medial aspect of right thigh without any in duration. No warmth, tenderness or swelling. Significantly improved compared to admission.   Discharge Instructions  Discharge Orders   Future Appointments Provider Department Dept Phone   06/02/2013 8:20 AM Vernie Shanks, MD [facility] [phone]   Future Orders Complete By Expires   Call MD for:  persistant nausea and vomiting  As directed    Call MD for:  redness, tenderness, or signs of infection (pain, swelling, redness, odor or green/yellow  discharge around incision site)  As directed    Call MD for:  severe uncontrolled pain  As directed    Call MD for:  temperature >100.4  As directed    Diet - low sodium heart healthy  As directed    Increase activity slowly  As directed        Medication List         doxycycline 100 MG tablet  Commonly known as:  VIBRA-TABS  Take 1 tablet (100 mg total) by mouth every 12 (twelve) hours.     ibuprofen 200 MG tablet  Commonly known as:  ADVIL,MOTRIN  Take 600 mg by mouth every 6 (six) hours as needed for moderate pain.     multivitamin with minerals Tabs tablet  Take 1 tablet by mouth daily.     niacin 1000 MG CR tablet  Commonly known as:  NIASPAN  TAKE 1 TABLET (1,000 MG TOTAL) BY MOUTH AT BEDTIME.     omeprazole 20 MG capsule  Commonly known as:  PRILOSEC  TAKE 1 CAPSULE (20 MG TOTAL) BY MOUTH 2 (TWO) TIMES DAILY.     promethazine 25 MG tablet  Commonly known as:  PHENERGAN  Take 1 tablet (25 mg total) by mouth every 8 (eight) hours as needed for nausea or vomiting.     simvastatin 40 MG tablet  Commonly known as:  ZOCOR  TAKE 1 TABLET (40 MG TOTAL) BY MOUTH AT BEDTIME.     testosterone cypionate 200 MG/ML injection  Commonly known as:  DEPO-TESTOSTERONE  Inject 0.5 mLs (100 mg total) into the muscle every 14 (fourteen) days.     trimethoprim 100 MG tablet  Commonly known as:  TRIMPEX  Take 100 mg by mouth daily.     URIBEL 118 MG Caps  Take 1 capsule by mouth daily.     valsartan-hydrochlorothiazide 160-12.5 MG per tablet  Commonly known as:  DIOVAN-HCT  TAKE 1 TABLET BY MOUTH DAILY WITH BREAKFAST.       Follow-up Information   Follow up with Anthoney Harada, MD. Schedule an appointment as soon as possible for a visit in 5 days.   Specialty:  Family Medicine   Contact information:   [place] [address] [phone]        The results of significant diagnostics from this hospitalization (including imaging, microbiology,  ancillary and laboratory) are listed below for reference.    Significant Diagnostic Studies: US Venous Img Lower Unilateral Right  04/06/2013   CLINICAL DATA:  Right leg pain and swelling  EXAM: Right LOWER EXTREMITY VENOUS DOPPLER ULTRASOUND  TECHNIQUE: Gray-scale sonography with graded compression, as well as color Doppler and duplex ultrasound, were performed to evaluate the deep venous system from the level of the common femoral vein through the popliteal and proximal calf veins. Spectral Doppler was utilized to evaluate flow at rest and with distal augmentation maneuvers.  COMPARISON:  None.  FINDINGS: Thrombus within deep veins:  None visualized.  Compressibility of deep veins:  Normal.  Duplex waveform respiratory phasicity:  Normal.  Duplex waveform response to augmentation:  Normal.  Venous reflux:  None visualized.  Other findings: Calf edema is identified. A prominent lymph node measuring 2.9 cm is noted in the left inguinal region but demonstrates a normal fatty hilus and is likely within normal limits.  IMPRESSION: No evidence of deep venous thrombosis.   Electronically Signed   By: Inez Catalina M.D.   On: 04/06/2013 11:25    Microbiology: Recent Results (from the past 240 hour(s))  MRSA PCR SCREENING     Status: None   Collection Time    04/06/13  5:41 PM      Result Value Ref Range Status   MRSA by PCR NEGATIVE  NEGATIVE Final   Comment:            The GeneXpert MRSA Assay (FDA     approved for NASAL specimens     only), is one component of a     comprehensive MRSA colonization     surveillance program. It is not     intended to diagnose MRSA     infection nor to guide or     monitor treatment for     MRSA infections.     Labs: Basic Metabolic Panel:  Recent Labs Lab 04/06/13 1042 04/08/13 0603  NA 137 140  K 3.8 4.0  CL 97 103  CO2 28 29  GLUCOSE 100* 114*  BUN 9 10  CREATININE 0.53 0.53  CALCIUM 8.8 8.6   Liver Function Tests:  Recent Labs Lab  04/06/13 1042  AST 16  ALT 15  ALKPHOS 78  BILITOT 0.8  PROT 7.1  ALBUMIN 3.0*   No results found for this basename: LIPASE, AMYLASE,  in the last 168 hours No results found for this basename: AMMONIA,  in the last 168 hours CBC:  Recent Labs Lab 04/06/13 1042 04/08/13 0603  WBC 9.6 8.3  NEUTROABS 7.5  --   HGB 14.2 12.0*  HCT 40.9 35.0*  MCV 88.3 88.4  PLT 200 225   Cardiac Enzymes: No results found for this basename: CKTOTAL, CKMB, CKMBINDEX, TROPONINI,  in the last 168 hours BNP: BNP (last 3 results) No results found for this basename: PROBNP,  in the last 8760 hours CBG: No results found for this basename: GLUCAP,  in the last 168 hours   Signed:  Vernell Leep, MD, FACP, FHM. [facility] Pager [phone]  If 7PM-7AM, please contact night-coverage [email or web address] Password [email or web address] 04/09/2013, 1:10 PM

## 2013-04-09 NOTE — Discharge Instructions (Signed)
Cellulitis Cellulitis is an infection of the skin and the tissue beneath it. The infected area is usually red and tender. Cellulitis occurs most often in the arms and lower legs.  CAUSES  Cellulitis is caused by bacteria that enter the skin through cracks or cuts in the skin. The most common types of bacteria that cause cellulitis are Staphylococcus and Streptococcus. SYMPTOMS   Redness and warmth.  Swelling.  Tenderness or pain.  Fever. DIAGNOSIS  Your caregiver can usually determine what is wrong based on a physical exam. Blood tests may also be done. TREATMENT  Treatment usually involves taking an antibiotic medicine. HOME CARE INSTRUCTIONS   Take your antibiotics as directed. Finish them even if you start to feel better.  Keep the infected arm or leg elevated to reduce swelling.  Apply a warm cloth to the affected area up to 4 times per day to relieve pain.  Only take over-the-counter or prescription medicines for pain, discomfort, or fever as directed by your caregiver.  Keep all follow-up appointments as directed by your caregiver. SEEK MEDICAL CARE IF:   You notice red streaks coming from the infected area.  Your red area gets larger or turns dark in color.  Your bone or joint underneath the infected area becomes painful after the skin has healed.  Your infection returns in the same area or another area.  You notice a swollen bump in the infected area.  You develop new symptoms. SEEK IMMEDIATE MEDICAL CARE IF:   You have a fever.  You feel very sleepy.  You develop vomiting or diarrhea.  You have a general ill feeling (malaise) with muscle aches and pains. MAKE SURE YOU:   Understand these instructions.  Will watch your condition.  Will get help right away if you are not doing well or get worse. Document Released: 11/13/2004 Document Revised: 08/05/2011 Document Reviewed: 04/21/2011 [employer] Patient Information 2014 [employer].  

## 2013-04-19 ENCOUNTER — Other Ambulatory Visit: Payer: Self-pay | Admitting: Family Medicine

## 2013-04-21 NOTE — Telephone Encounter (Signed)
Last seen 04/04/13  [facility]  If approved route to nurse to call into [facility]

## 2013-05-06 ENCOUNTER — Other Ambulatory Visit: Payer: Self-pay | Admitting: Family Medicine

## 2013-05-30 ENCOUNTER — Other Ambulatory Visit: Payer: Self-pay | Admitting: *Deleted

## 2013-05-30 DIAGNOSIS — I10 Essential (primary) hypertension: Secondary | ICD-10-CM

## 2013-05-30 MED ORDER — VALSARTAN-HYDROCHLOROTHIAZIDE 160-12.5 MG PO TABS: ORAL_TABLET | ORAL | Status: DC

## 2013-06-02 ENCOUNTER — Ambulatory Visit: Payer: Medicare HMO | Admitting: Family Medicine

## 2013-06-13 ENCOUNTER — Ambulatory Visit: Payer: Medicare HMO | Admitting: Family Medicine

## 2013-06-13 ENCOUNTER — Other Ambulatory Visit: Payer: Self-pay | Admitting: Family Medicine

## 2013-06-13 DIAGNOSIS — K529 Noninfective gastroenteritis and colitis, unspecified: Secondary | ICD-10-CM

## 2013-06-13 DIAGNOSIS — R509 Fever, unspecified: Secondary | ICD-10-CM

## 2013-06-13 DIAGNOSIS — I10 Essential (primary) hypertension: Secondary | ICD-10-CM

## 2013-06-13 MED ORDER — TRIMETHOPRIM 100 MG PO TABS: 100.0000 mg | ORAL_TABLET | Freq: Every day | ORAL | Status: DC

## 2013-06-13 MED ORDER — NIACIN ER (ANTIHYPERLIPIDEMIC) 1000 MG PO TBCR: EXTENDED_RELEASE_TABLET | ORAL | Status: DC

## 2013-06-13 MED ORDER — PROMETHAZINE HCL 25 MG PO TABS: 25.0000 mg | ORAL_TABLET | Freq: Three times a day (TID) | ORAL | Status: DC | PRN

## 2013-06-13 MED ORDER — VALSARTAN-HYDROCHLOROTHIAZIDE 160-12.5 MG PO TABS: ORAL_TABLET | ORAL | Status: DC

## 2013-06-13 MED ORDER — SIMVASTATIN 40 MG PO TABS: ORAL_TABLET | ORAL | Status: DC

## 2013-06-13 MED ORDER — OMEPRAZOLE 20 MG PO CPDR: DELAYED_RELEASE_CAPSULE | ORAL | Status: DC

## 2013-06-13 NOTE — Telephone Encounter (Signed)
Patient had appt scheduled with Ernestina Patches today and Ernestina Patches is not here can we please refill his medication 1 time until he can get back in on 5/4 with [facility]

## 2013-06-13 NOTE — Telephone Encounter (Signed)
Call patient : Prescription refilled & sent to pharmacy in [place]. 

## 2013-06-20 ENCOUNTER — Ambulatory Visit (INDEPENDENT_AMBULATORY_CARE_PROVIDER_SITE_OTHER): Payer: Medicare HMO | Admitting: Family Medicine

## 2013-06-20 ENCOUNTER — Ambulatory Visit (INDEPENDENT_AMBULATORY_CARE_PROVIDER_SITE_OTHER): Payer: Medicare HMO

## 2013-06-20 ENCOUNTER — Encounter: Payer: Self-pay | Admitting: Family Medicine

## 2013-06-20 VITALS — BP 142/87 | HR 79 | Temp 97.5°F

## 2013-06-20 DIAGNOSIS — E291 Testicular hypofunction: Secondary | ICD-10-CM

## 2013-06-20 DIAGNOSIS — M549 Dorsalgia, unspecified: Secondary | ICD-10-CM

## 2013-06-20 MED ORDER — CYCLOBENZAPRINE HCL 10 MG PO TABS: 10.0000 mg | ORAL_TABLET | Freq: Three times a day (TID) | ORAL | Status: DC | PRN

## 2013-06-20 NOTE — Progress Notes (Addendum)
   Subjective:    Patient ID: Dustin Medina, male    DOB: [birth year], 63 y.o.   MRN: [id number]  HPI Back pain: Patient is a baseline paraplegic with loss of sensation below the groin. Can and intermittently with a prosthesis. But usually uses a wheelchair. States that he's had lower back pain for the past 3-4 weeks. Pain seems to be worse with movement. Has a TENS unit at home. This seems to help about 3-4 hours. Denies any fevers, chills, vomiting. Noted to have been dated back in February for cellulitis status post fall. This has been resolved.  Also asking for refill testosterone.   Review of Systems  All other systems reviewed and are negative.      Objective:   Physical Exam  Constitutional: He appears well-developed and well-nourished.  HENT:  Head: Normocephalic and atraumatic.  Eyes: Conjunctivae are normal. Pupils are equal, round, and reactive to light.  Neck: Normal range of motion. Neck supple.  Cardiovascular: Normal rate and regular rhythm.   Pulmonary/Chest: Effort normal and breath sounds normal.  Abdominal: Soft.  Musculoskeletal:       Back:  Mild TTP over affected area    Neurological: He is alert.  Skin: Skin is warm.   Dg Lumbar Spine Complete  06/20/2013   CLINICAL DATA:  Low back pain.  History of quadriplegia.  EXAM: LUMBAR SPINE - COMPLETE 4+ VIEW  COMPARISON:  CT 04/25/2010  FINDINGS: Two views of the lumbar spine were obtained. There is chronic cortical thickening along the left iliac wing. There is a chronic anterior compression deformity involving the T12 vertebral body. Severe disc space narrowing at L2-L3 is chronic. Alignment of the lumbar spine is unchanged. Aorta is heavily calcified. Stable kyphosis at T11-T12.  IMPRESSION: No acute bone abnormality in the lumbar spine.  Chronic compression deformity at T12. Severe degenerative disc disease at L2-L3.   Electronically Signed   By: Markus Daft M.D.   On: 06/20/2013 14:21          Assessment &  Plan:  Back pain - Plan: DG Lumbar Spine Complete  Hypogonadism male - Plan: Testosterone, free, total, CBC, PSA, total and free  Back pain seems more musculoskeletal/spasmodic in nature. Xrays as above. Start on as needed Flexeril. Consider more aggressive workup if symptoms persist despite treatment as paraplegic state may blunt/obscure local pathology.  Check TSH and PSA Dose testosterone on labs.  Discussed MSK red flags.  Follow up as needed.   Marland Kitchen

## 2013-06-23 ENCOUNTER — Telehealth: Payer: Self-pay | Admitting: Family Medicine

## 2013-06-23 DIAGNOSIS — M545 Low back pain, unspecified: Secondary | ICD-10-CM

## 2013-06-23 LAB — CBC
HCT: 43.2 % (ref 37.5–51.0)
HEMOGLOBIN: 14.3 g/dL (ref 12.6–17.7)
MCH: 29.8 pg (ref 26.6–33.0)
MCHC: 33.1 g/dL (ref 31.5–35.7)
MCV: 90 fL (ref 79–97)
PLATELETS: 283 10*3/uL (ref 150–379)
RBC: 4.8 x10E6/uL (ref 4.14–5.80)
RDW: 14.5 % (ref 12.3–15.4)
WBC: 7.1 10*3/uL (ref 3.4–10.8)

## 2013-06-23 LAB — PSA, TOTAL AND FREE
PSA FREE: 0.46 ng/mL
PSA, Free Pct: 11.5 %
PSA: 4 ng/mL (ref 0.0–4.0)

## 2013-06-23 LAB — TESTOSTERONE, FREE, TOTAL, SHBG
TESTOSTERONE FREE: 7.6 pg/mL (ref 6.6–18.1)
TESTOSTERONE, TOTAL: 286.7 ng/dL — AB (ref 348.0–1197.0)

## 2013-06-24 ENCOUNTER — Telehealth: Payer: Self-pay

## 2013-06-24 NOTE — Telephone Encounter (Signed)
Discussed xray results. Patient wants to proceed with ortho referral. Referral placed.  Lab results need reviewing by provider.   PSA was 4. There is no recorded PSA for 2014. Last results were 3.02 on 09/09/11 and 2.86 on 02/06/12.

## 2013-06-24 NOTE — Telephone Encounter (Signed)
Pt aware of Lumbar Spine x-ray results. He also agrees to be seen by Dr. Rolena Infante at [place]. Referral faxed and waiting on appointment date/time

## 2013-06-27 ENCOUNTER — Other Ambulatory Visit: Payer: Self-pay | Admitting: Family Medicine

## 2013-06-27 MED ORDER — TESTOSTERONE CYPIONATE 200 MG/ML IM SOLN
150.0000 mg | INTRAMUSCULAR | Status: DC
Start: 2013-06-27 — End: 2013-06-28

## 2013-06-27 NOTE — Telephone Encounter (Signed)
Please right an rx for testosterone

## 2013-06-27 NOTE — Telephone Encounter (Signed)
PAtient aware 

## 2013-06-27 NOTE — Telephone Encounter (Signed)
New Rx written Please call this in.  Thank you

## 2013-06-27 NOTE — Telephone Encounter (Signed)
PSA is ULN.  This was a baseline test prior to starting pt on testosterone.  Will need recheck 6-8 weeks after starting.  If there is a marked spike, will need to stop testosterone and follow up with urology possibly.

## 2013-06-28 ENCOUNTER — Other Ambulatory Visit: Payer: Self-pay | Admitting: Family Medicine

## 2013-06-28 MED ORDER — TESTOSTERONE CYPIONATE 200 MG/ML IM SOLN: 150.0000 mg | INTRAMUSCULAR | Status: DC

## 2013-06-28 NOTE — Telephone Encounter (Signed)
Rx up front for patient to pick up patient aware

## 2013-07-06 ENCOUNTER — Inpatient Hospital Stay
Admission: RE | Admit: 2013-07-06 | Discharge: 2013-07-06 | Disposition: A | Discharge status: Home / Self Care | Payer: Self-pay | Source: Ambulatory Visit | Attending: Pharmacist | Admitting: Pharmacist

## 2013-07-06 ENCOUNTER — Other Ambulatory Visit: Payer: Self-pay | Admitting: Pharmacist

## 2013-07-06 ENCOUNTER — Inpatient Hospital Stay
Admission: RE | Admit: 2013-07-06 | Discharge: 2013-07-06 | Disposition: A | Discharge status: Home / Self Care | Payer: Self-pay | Source: Ambulatory Visit | Attending: Family Medicine | Admitting: Family Medicine

## 2013-07-06 DIAGNOSIS — G8220 Paraplegia, unspecified: Secondary | ICD-10-CM

## 2013-07-06 DIAGNOSIS — R7989 Other specified abnormal findings of blood chemistry: Secondary | ICD-10-CM | POA: Insufficient documentation

## 2013-08-23 ENCOUNTER — Ambulatory Visit: Payer: Medicare HMO | Attending: Orthopedic Surgery | Admitting: Physical Therapy

## 2013-08-23 DIAGNOSIS — L98499 Non-pressure chronic ulcer of skin of other sites with unspecified severity: Secondary | ICD-10-CM | POA: Diagnosis not present

## 2013-08-23 DIAGNOSIS — M545 Low back pain, unspecified: Secondary | ICD-10-CM | POA: Insufficient documentation

## 2013-08-23 DIAGNOSIS — I10 Essential (primary) hypertension: Secondary | ICD-10-CM | POA: Diagnosis not present

## 2013-08-23 DIAGNOSIS — R5381 Other malaise: Secondary | ICD-10-CM | POA: Diagnosis not present

## 2013-08-23 DIAGNOSIS — IMO0001 Reserved for inherently not codable concepts without codable children: Secondary | ICD-10-CM | POA: Diagnosis present

## 2013-08-25 ENCOUNTER — Ambulatory Visit: Payer: Medicare HMO | Admitting: Physical Therapy

## 2013-08-25 DIAGNOSIS — IMO0001 Reserved for inherently not codable concepts without codable children: Secondary | ICD-10-CM | POA: Diagnosis not present

## 2013-08-30 ENCOUNTER — Ambulatory Visit: Payer: Medicare HMO | Admitting: Physical Therapy

## 2013-08-30 DIAGNOSIS — IMO0001 Reserved for inherently not codable concepts without codable children: Secondary | ICD-10-CM | POA: Diagnosis not present

## 2013-09-01 ENCOUNTER — Ambulatory Visit: Payer: Medicare HMO | Admitting: Physical Therapy

## 2013-09-01 DIAGNOSIS — IMO0001 Reserved for inherently not codable concepts without codable children: Secondary | ICD-10-CM | POA: Diagnosis not present

## 2013-09-06 ENCOUNTER — Other Ambulatory Visit: Payer: Self-pay | Admitting: *Deleted

## 2013-09-06 ENCOUNTER — Ambulatory Visit: Payer: Medicare HMO | Admitting: *Deleted

## 2013-09-06 DIAGNOSIS — IMO0001 Reserved for inherently not codable concepts without codable children: Secondary | ICD-10-CM | POA: Diagnosis not present

## 2013-09-06 MED ORDER — NIACIN ER (ANTIHYPERLIPIDEMIC) 1000 MG PO TBCR: EXTENDED_RELEASE_TABLET | ORAL | Status: DC

## 2013-09-07 ENCOUNTER — Encounter: Payer: Self-pay | Admitting: Physical Medicine & Rehabilitation

## 2013-09-08 ENCOUNTER — Ambulatory Visit: Payer: Medicare HMO | Admitting: *Deleted

## 2013-09-08 DIAGNOSIS — IMO0001 Reserved for inherently not codable concepts without codable children: Secondary | ICD-10-CM | POA: Diagnosis not present

## 2013-09-13 ENCOUNTER — Encounter: Payer: Medicare HMO | Admitting: *Deleted

## 2013-09-13 ENCOUNTER — Ambulatory Visit: Payer: Medicare HMO | Admitting: Physical Therapy

## 2013-09-13 DIAGNOSIS — IMO0001 Reserved for inherently not codable concepts without codable children: Secondary | ICD-10-CM | POA: Diagnosis not present

## 2013-09-15 ENCOUNTER — Ambulatory Visit: Payer: Medicare HMO | Admitting: *Deleted

## 2013-09-20 ENCOUNTER — Ambulatory Visit: Payer: Medicare HMO | Admitting: Physical Therapy

## 2013-09-22 ENCOUNTER — Encounter: Payer: Medicare HMO | Admitting: Physical Therapy

## 2013-09-27 ENCOUNTER — Ambulatory Visit: Payer: Medicare HMO | Attending: Orthopedic Surgery | Admitting: Physical Therapy

## 2013-09-27 DIAGNOSIS — R5381 Other malaise: Secondary | ICD-10-CM | POA: Diagnosis not present

## 2013-09-27 DIAGNOSIS — I10 Essential (primary) hypertension: Secondary | ICD-10-CM | POA: Diagnosis not present

## 2013-09-27 DIAGNOSIS — M545 Low back pain, unspecified: Secondary | ICD-10-CM | POA: Insufficient documentation

## 2013-09-27 DIAGNOSIS — IMO0001 Reserved for inherently not codable concepts without codable children: Secondary | ICD-10-CM | POA: Insufficient documentation

## 2013-09-27 DIAGNOSIS — L98499 Non-pressure chronic ulcer of skin of other sites with unspecified severity: Secondary | ICD-10-CM | POA: Diagnosis not present

## 2013-09-28 ENCOUNTER — Emergency Department (HOSPITAL_COMMUNITY): Payer: Medicare HMO

## 2013-09-28 ENCOUNTER — Inpatient Hospital Stay (HOSPITAL_COMMUNITY)
Admission: EM | Admit: 2013-09-28 | Discharge: 2013-10-03 | DRG: 871 | Disposition: A | Discharge status: Home / Self Care | Payer: Medicare HMO | Attending: Internal Medicine | Admitting: Internal Medicine

## 2013-09-28 ENCOUNTER — Encounter (HOSPITAL_COMMUNITY): Payer: Self-pay | Admitting: Emergency Medicine

## 2013-09-28 DIAGNOSIS — L89102 Pressure ulcer of unspecified part of back, stage 2: Secondary | ICD-10-CM

## 2013-09-28 DIAGNOSIS — K219 Gastro-esophageal reflux disease without esophagitis: Secondary | ICD-10-CM | POA: Diagnosis present

## 2013-09-28 DIAGNOSIS — L0390 Cellulitis, unspecified: Secondary | ICD-10-CM | POA: Diagnosis present

## 2013-09-28 DIAGNOSIS — L89109 Pressure ulcer of unspecified part of back, unspecified stage: Secondary | ICD-10-CM | POA: Diagnosis present

## 2013-09-28 DIAGNOSIS — A419 Sepsis, unspecified organism: Principal | ICD-10-CM | POA: Diagnosis present

## 2013-09-28 DIAGNOSIS — N3000 Acute cystitis without hematuria: Secondary | ICD-10-CM

## 2013-09-28 DIAGNOSIS — L03116 Cellulitis of left lower limb: Secondary | ICD-10-CM

## 2013-09-28 DIAGNOSIS — L03119 Cellulitis of unspecified part of limb: Secondary | ICD-10-CM | POA: Diagnosis present

## 2013-09-28 DIAGNOSIS — G4730 Sleep apnea, unspecified: Secondary | ICD-10-CM | POA: Diagnosis present

## 2013-09-28 DIAGNOSIS — I10 Essential (primary) hypertension: Secondary | ICD-10-CM | POA: Diagnosis present

## 2013-09-28 DIAGNOSIS — Z86718 Personal history of other venous thrombosis and embolism: Secondary | ICD-10-CM

## 2013-09-28 DIAGNOSIS — R112 Nausea with vomiting, unspecified: Secondary | ICD-10-CM

## 2013-09-28 DIAGNOSIS — E785 Hyperlipidemia, unspecified: Secondary | ICD-10-CM | POA: Diagnosis present

## 2013-09-28 DIAGNOSIS — Z86711 Personal history of pulmonary embolism: Secondary | ICD-10-CM

## 2013-09-28 DIAGNOSIS — N390 Urinary tract infection, site not specified: Secondary | ICD-10-CM | POA: Diagnosis present

## 2013-09-28 DIAGNOSIS — R339 Retention of urine, unspecified: Secondary | ICD-10-CM | POA: Diagnosis present

## 2013-09-28 DIAGNOSIS — G8220 Paraplegia, unspecified: Secondary | ICD-10-CM | POA: Diagnosis present

## 2013-09-28 DIAGNOSIS — R509 Fever, unspecified: Secondary | ICD-10-CM | POA: Diagnosis present

## 2013-09-28 DIAGNOSIS — D72829 Elevated white blood cell count, unspecified: Secondary | ICD-10-CM

## 2013-09-28 DIAGNOSIS — J40 Bronchitis, not specified as acute or chronic: Secondary | ICD-10-CM

## 2013-09-28 DIAGNOSIS — L02419 Cutaneous abscess of limb, unspecified: Secondary | ICD-10-CM | POA: Diagnosis present

## 2013-09-28 DIAGNOSIS — L8993 Pressure ulcer of unspecified site, stage 3: Secondary | ICD-10-CM | POA: Diagnosis present

## 2013-09-28 DIAGNOSIS — F17200 Nicotine dependence, unspecified, uncomplicated: Secondary | ICD-10-CM | POA: Diagnosis present

## 2013-09-28 LAB — CBC WITH DIFFERENTIAL/PLATELET
Basophils Absolute: 0 10*3/uL (ref 0.0–0.1)
Basophils Relative: 0 % (ref 0–1)
Eosinophils Absolute: 0 10*3/uL (ref 0.0–0.7)
Eosinophils Relative: 0 % (ref 0–5)
HCT: 43 % (ref 39.0–52.0)
Hemoglobin: 15.1 g/dL (ref 13.0–17.0)
Lymphocytes Relative: 4 % — ABNORMAL LOW (ref 12–46)
Lymphs Abs: 0.8 10*3/uL (ref 0.7–4.0)
MCH: 30.5 pg (ref 26.0–34.0)
MCHC: 35.1 g/dL (ref 30.0–36.0)
MCV: 86.9 fL (ref 78.0–100.0)
Monocytes Absolute: 0.7 10*3/uL (ref 0.1–1.0)
Monocytes Relative: 3 % (ref 3–12)
Neutro Abs: 20.6 10*3/uL — ABNORMAL HIGH (ref 1.7–7.7)
Neutrophils Relative %: 93 % — ABNORMAL HIGH (ref 43–77)
Platelets: 230 10*3/uL (ref 150–400)
RBC: 4.95 MIL/uL (ref 4.22–5.81)
RDW: 14.5 % (ref 11.5–15.5)
WBC: 22.1 10*3/uL — ABNORMAL HIGH (ref 4.0–10.5)

## 2013-09-28 LAB — URINALYSIS, ROUTINE W REFLEX MICROSCOPIC
Glucose, UA: 100 mg/dL — AB
Hgb urine dipstick: NEGATIVE
Leukocytes, UA: NEGATIVE
Nitrite: POSITIVE — AB
Specific Gravity, Urine: 1.02 (ref 1.005–1.030)
Urobilinogen, UA: 1 mg/dL (ref 0.0–1.0)
pH: 6 (ref 5.0–8.0)

## 2013-09-28 LAB — COMPREHENSIVE METABOLIC PANEL
ALK PHOS: 72 U/L (ref 39–117)
ALT: 21 U/L (ref 0–53)
AST: 23 U/L (ref 0–37)
Albumin: 3.9 g/dL (ref 3.5–5.2)
Anion gap: 14 (ref 5–15)
BILIRUBIN TOTAL: 0.9 mg/dL (ref 0.3–1.2)
BUN: 14 mg/dL (ref 6–23)
CO2: 25 mEq/L (ref 19–32)
Calcium: 9.3 mg/dL (ref 8.4–10.5)
Chloride: 96 mEq/L (ref 96–112)
Creatinine, Ser: 0.6 mg/dL (ref 0.50–1.35)
GFR calc non Af Amer: >90 mL/min (ref >90)
Glucose, Bld: 130 mg/dL — ABNORMAL HIGH (ref 70–99)
POTASSIUM: 3.6 meq/L — AB (ref 3.7–5.3)
SODIUM: 135 meq/L — AB (ref 137–147)
TOTAL PROTEIN: 7.4 g/dL (ref 6.0–8.3)

## 2013-09-28 LAB — URINE MICROSCOPIC-ADD ON

## 2013-09-28 LAB — LACTIC ACID, PLASMA: LACTIC ACID, VENOUS: 1.4 mmol/L (ref 0.5–2.2)

## 2013-09-28 MED ORDER — VANCOMYCIN HCL IN DEXTROSE 1-5 GM/200ML-% IV SOLN
1000.0000 mg | Freq: Once | INTRAVENOUS | Status: AC
  Administered 2013-09-28: 1000 mg via INTRAVENOUS
  Filled 2013-09-28: qty 200

## 2013-09-28 MED ORDER — SODIUM CHLORIDE 0.9 % IV SOLN
1000.0000 mL | Freq: Once | INTRAVENOUS | Status: AC
  Administered 2013-09-28: 1000 mL via INTRAVENOUS

## 2013-09-28 MED ORDER — ONDANSETRON HCL 4 MG/2ML IJ SOLN
4.0000 mg | Freq: Once | INTRAMUSCULAR | Status: AC
  Administered 2013-09-28: 4 mg via INTRAVENOUS
  Filled 2013-09-28: qty 2

## 2013-09-28 MED ORDER — SODIUM CHLORIDE 0.9 % IV SOLN
1000.0000 mL | INTRAVENOUS | Status: DC
  Administered 2013-09-28 – 2013-09-30 (×3): 1000 mL via INTRAVENOUS

## 2013-09-28 MED ORDER — PIPERACILLIN-TAZOBACTAM 3.375 G IVPB 30 MIN
3.3750 g | Freq: Once | INTRAVENOUS | Status: AC
  Administered 2013-09-28: 3.375 g via INTRAVENOUS
  Filled 2013-09-28: qty 50
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
## 2013-09-28 NOTE — ED Notes (Signed)
Dr.Knapp at bedside  

## 2013-09-28 NOTE — ED Notes (Signed)
Onset this morning, body aches, vomiting, states other in the house has had the same

## 2013-09-28 NOTE — ED Notes (Signed)
Pt. Vomiting. EDP at bedside.

## 2013-09-29 DIAGNOSIS — R509 Fever, unspecified: Secondary | ICD-10-CM

## 2013-09-29 LAB — CBC
HEMATOCRIT: 39.5 % (ref 39.0–52.0)
HEMOGLOBIN: 13.4 g/dL (ref 13.0–17.0)
MCH: 30 pg (ref 26.0–34.0)
MCHC: 33.9 g/dL (ref 30.0–36.0)
MCV: 88.4 fL (ref 78.0–100.0)
PLATELETS: 223 10*3/uL (ref 150–400)
RBC: 4.47 MIL/uL (ref 4.22–5.81)
RDW: 14.9 % (ref 11.5–15.5)
WBC: 18.3 10*3/uL — ABNORMAL HIGH (ref 4.0–10.5)

## 2013-09-29 LAB — COMPREHENSIVE METABOLIC PANEL
ALBUMIN: 3.1 g/dL — AB (ref 3.5–5.2)
ALT: 17 U/L (ref 0–53)
AST: 20 U/L (ref 0–37)
Alkaline Phosphatase: 70 U/L (ref 39–117)
Anion gap: 12 (ref 5–15)
BUN: 12 mg/dL (ref 6–23)
CALCIUM: 8.5 mg/dL (ref 8.4–10.5)
CO2: 27 meq/L (ref 19–32)
Chloride: 100 mEq/L (ref 96–112)
Creatinine, Ser: 0.74 mg/dL (ref 0.50–1.35)
GFR calc Af Amer: >90 mL/min (ref >90)
Glucose, Bld: 118 mg/dL — ABNORMAL HIGH (ref 70–99)
Potassium: 3.7 mEq/L (ref 3.7–5.3)
SODIUM: 139 meq/L (ref 137–147)
Total Bilirubin: 1.1 mg/dL (ref 0.3–1.2)
Total Protein: 6.6 g/dL (ref 6.0–8.3)

## 2013-09-29 LAB — MRSA PCR SCREENING: MRSA by PCR: NEGATIVE

## 2013-09-29 MED ORDER — ONDANSETRON HCL 4 MG PO TABS: 4.0000 mg | ORAL_TABLET | Freq: Four times a day (QID) | ORAL | Status: DC | PRN

## 2013-09-29 MED ORDER — SODIUM CHLORIDE 0.9 % IV SOLN
1500.0000 mg | Freq: Two times a day (BID) | INTRAVENOUS | Status: DC
  Administered 2013-09-29 (×2): 1500 mg via INTRAVENOUS
  Filled 2013-09-29 (×3): qty 1500

## 2013-09-29 MED ORDER — PIPERACILLIN-TAZOBACTAM 3.375 G IVPB
3.3750 g | Freq: Three times a day (TID) | INTRAVENOUS | Status: DC
  Administered 2013-09-29 – 2013-10-03 (×13): 3.375 g via INTRAVENOUS
  Filled 2013-09-29 (×16): qty 50

## 2013-09-29 MED ORDER — HYDROCHLOROTHIAZIDE 12.5 MG PO CAPS
12.5000 mg | ORAL_CAPSULE | Freq: Every day | ORAL | Status: DC
Start: 2013-09-29 — End: 2013-10-03
  Administered 2013-09-29 – 2013-10-03 (×5): 12.5 mg via ORAL
  Filled 2013-09-29 (×5): qty 1

## 2013-09-29 MED ORDER — IRBESARTAN 150 MG PO TABS
150.0000 mg | ORAL_TABLET | Freq: Every day | ORAL | Status: DC
  Administered 2013-09-29 – 2013-10-03 (×5): 150 mg via ORAL
  Filled 2013-09-29 (×5): qty 1

## 2013-09-29 MED ORDER — CYCLOBENZAPRINE HCL 10 MG PO TABS: 10.0000 mg | ORAL_TABLET | Freq: Three times a day (TID) | ORAL | Status: DC | PRN

## 2013-09-29 MED ORDER — PANTOPRAZOLE SODIUM 40 MG PO TBEC
40.0000 mg | DELAYED_RELEASE_TABLET | Freq: Every day | ORAL | Status: DC
Start: 2013-09-29 — End: 2013-10-03
  Administered 2013-09-29 – 2013-10-03 (×5): 40 mg via ORAL
  Filled 2013-09-29 (×5): qty 1

## 2013-09-29 MED ORDER — PIPERACILLIN-TAZOBACTAM 3.375 G IVPB
3.3750 g | Freq: Once | INTRAVENOUS | Status: AC
  Administered 2013-09-29: 3.375 g via INTRAVENOUS
  Filled 2013-09-29: qty 50

## 2013-09-29 MED ORDER — DOCUSATE SODIUM 100 MG PO CAPS: 100.0000 mg | ORAL_CAPSULE | Freq: Every day | ORAL | Status: DC | PRN

## 2013-09-29 MED ORDER — ALUM & MAG HYDROXIDE-SIMETH 200-200-20 MG/5ML PO SUSP: 30.0000 mL | Freq: Four times a day (QID) | ORAL | Status: DC | PRN

## 2013-09-29 MED ORDER — SIMVASTATIN 20 MG PO TABS
40.0000 mg | ORAL_TABLET | Freq: Every day | ORAL | Status: DC
  Administered 2013-09-29 – 2013-10-02 (×5): 40 mg via ORAL
  Filled 2013-09-29 (×5): qty 2

## 2013-09-29 MED ORDER — NIACIN ER (ANTIHYPERLIPIDEMIC) 500 MG PO TBCR
1000.0000 mg | EXTENDED_RELEASE_TABLET | Freq: Every day | ORAL | Status: DC
  Administered 2013-09-29 – 2013-10-02 (×5): 1000 mg via ORAL
  Filled 2013-09-29 (×7): qty 2

## 2013-09-29 MED ORDER — VALSARTAN-HYDROCHLOROTHIAZIDE 160-12.5 MG PO TABS: 1.0000 | ORAL_TABLET | Freq: Every day | ORAL | Status: DC

## 2013-09-29 MED ORDER — PIPERACILLIN-TAZOBACTAM 3.375 G IVPB
INTRAVENOUS | Status: AC
  Filled 2013-09-29: qty 50

## 2013-09-29 MED ORDER — ACETAMINOPHEN 650 MG RE SUPP: 650.0000 mg | Freq: Four times a day (QID) | RECTAL | Status: DC | PRN

## 2013-09-29 MED ORDER — ACETAMINOPHEN 325 MG PO TABS
650.0000 mg | ORAL_TABLET | Freq: Four times a day (QID) | ORAL | Status: DC | PRN
  Administered 2013-09-29: 650 mg via ORAL
  Filled 2013-09-29 (×2): qty 2

## 2013-09-29 MED ORDER — ENOXAPARIN SODIUM 40 MG/0.4ML ~~LOC~~ SOLN
40.0000 mg | SUBCUTANEOUS | Status: DC
  Administered 2013-09-29 – 2013-10-03 (×5): 40 mg via SUBCUTANEOUS
  Filled 2013-09-29 (×5): qty 0.4

## 2013-09-29 MED ORDER — NIACIN ER 250 MG PO CPCR
ORAL_CAPSULE | ORAL | Status: AC
  Filled 2013-09-29: qty 4

## 2013-09-29 MED ORDER — ONDANSETRON HCL 4 MG/2ML IJ SOLN: 4.0000 mg | Freq: Four times a day (QID) | INTRAMUSCULAR | Status: DC | PRN

## 2013-09-29 NOTE — Care Management Utilization Note (Signed)
UR completed 
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
## 2013-09-29 NOTE — Progress Notes (Signed)
ANTIBIOTIC CONSULT NOTE - FOLLOW UP  Pharmacy Consult for Vancomycin & Zosyn Indication: rule out sepsis  No Known Allergies  Patient Measurements: Height: 5\' 11"  (180.3 cm) Weight: 165 lb 2 oz (74.9 kg) IBW/kg (Calculated) : 75.3  Vital Signs: Temp: 99.4 F (37.4 C) (08/13 0527) Temp src: Oral (08/13 0527) BP: 107/55 mmHg (08/13 0527) Pulse Rate: 100 (08/13 0527) Intake/Output from previous day:   Intake/Output from this shift:    Labs:  Recent Labs  09/28/13 1940 09/29/13 0537  WBC 22.1* 18.3*  HGB 15.1 13.4  PLT 230 223  CREATININE 0.60  --    Estimated Creatinine Clearance: 101.4 ml/min (by C-G formula based on Cr of 0.6). No results found for this basename: VANCOTROUGH, Corlis Leak, VANCORANDOM, GENTTROUGH, GENTPEAK, GENTRANDOM, TOBRATROUGH, TOBRAPEAK, TOBRARND, AMIKACINPEAK, AMIKACINTROU, AMIKACIN,  in the last 72 hours   Microbiology: Recent Results (from the past 720 hour(s))  CULTURE, BLOOD (ROUTINE X 2)     Status: None   Collection Time    09/28/13  7:40 PM      Result Value Ref Range Status   Specimen Description BLOOD LEFT ARM   Final   Special Requests BOTTLES DRAWN AEROBIC AND ANAEROBIC [place]   Final   Culture PENDING   Incomplete   Report Status PENDING   Incomplete  CULTURE, BLOOD (ROUTINE X 2)     Status: None   Collection Time    09/28/13  7:43 PM      Result Value Ref Range Status   Specimen Description BLOOD RIGHT ARM   Final   Special Requests BOTTLES DRAWN AEROBIC AND ANAEROBIC 10CC EACH   Final   Culture PENDING   Incomplete   Report Status PENDING   Incomplete  MRSA PCR SCREENING     Status: None   Collection Time    09/29/13  2:13 AM      Result Value Ref Range Status   MRSA by PCR NEGATIVE  NEGATIVE Final   Comment:            The GeneXpert MRSA Assay (FDA     approved for NASAL specimens     only), is one component of a     comprehensive MRSA colonization     surveillance program. It is not     intended to diagnose MRSA     infection nor to guide or     monitor treatment for     MRSA infections.    Anti-infectives   Start     Dose/Rate Route Frequency Ordered Stop   09/29/13 0245  piperacillin-tazobactam (ZOSYN) IVPB 3.375 g     3.375 g 12.5 mL/hr over 240 Minutes Intravenous  Once 09/29/13 0234 09/29/13 0721   09/28/13 2100  vancomycin (VANCOCIN) IVPB 1000 mg/200 mL premix     1,000 mg 200 mL/hr over 60 Minutes Intravenous  Once 09/28/13 2056 09/28/13 2251   09/28/13 2100  piperacillin-tazobactam (ZOSYN) IVPB 3.375 g     3.375 g 100 mL/hr over 30 Minutes Intravenous  Once 09/28/13 2056 09/28/13 2144      Assessment: 63 yo paraplegic M who presented with fever, nausea/vomiting, foul smelling urine, & back pain.  He self-catheterizes and has hx multiple UTIs as well as hx +MRSA PCR and cellulitis/decubitus ulcer.   MRSA PCR is negative this admit.   He was febrile on admission (Tm 101.4) with elevated WBC.  Cx data pending.   Renal function is at patient's baseline with estimated CrCl >5ml/min.  Given that  patient is paraplegic it is probable that renal function is over-estimated.    Vancomycin 8/12>> Zosyn 8/12>>  Goal of Therapy:  Vancomycin trough level 15-20 mcg/ml  Plan:  Zosyn 3.375gm IV Q8h to be infused over 4hrs Vancomycin 1500mg  IV q12h Check Vancomycin trough at steady state Monitor renal function and cx data   Biagio Borg 09/29/2013,7:40 AM
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
## 2013-09-30 ENCOUNTER — Encounter: Payer: Medicare HMO | Admitting: Physical Therapy

## 2013-09-30 DIAGNOSIS — L89899 Pressure ulcer of other site, unspecified stage: Secondary | ICD-10-CM

## 2013-09-30 DIAGNOSIS — G8220 Paraplegia, unspecified: Secondary | ICD-10-CM

## 2013-09-30 DIAGNOSIS — L02419 Cutaneous abscess of limb, unspecified: Secondary | ICD-10-CM

## 2013-09-30 DIAGNOSIS — N3000 Acute cystitis without hematuria: Secondary | ICD-10-CM

## 2013-09-30 DIAGNOSIS — D72829 Elevated white blood cell count, unspecified: Secondary | ICD-10-CM

## 2013-09-30 DIAGNOSIS — L03119 Cellulitis of unspecified part of limb: Secondary | ICD-10-CM

## 2013-09-30 DIAGNOSIS — L8992 Pressure ulcer of unspecified site, stage 2: Secondary | ICD-10-CM

## 2013-09-30 DIAGNOSIS — A419 Sepsis, unspecified organism: Principal | ICD-10-CM

## 2013-09-30 LAB — URINE CULTURE
COLONY COUNT: NO GROWTH
Culture: NO GROWTH

## 2013-09-30 LAB — CBC
HCT: 37.6 % — ABNORMAL LOW (ref 39.0–52.0)
Hemoglobin: 12.6 g/dL — ABNORMAL LOW (ref 13.0–17.0)
MCH: 29.8 pg (ref 26.0–34.0)
MCHC: 33.5 g/dL (ref 30.0–36.0)
MCV: 88.9 fL (ref 78.0–100.0)
PLATELETS: 208 10*3/uL (ref 150–400)
RBC: 4.23 MIL/uL (ref 4.22–5.81)
RDW: 15.3 % (ref 11.5–15.5)
WBC: 11.4 10*3/uL — AB (ref 4.0–10.5)

## 2013-09-30 LAB — COMPREHENSIVE METABOLIC PANEL
ALT: 16 U/L (ref 0–53)
ANION GAP: 9 (ref 5–15)
AST: 17 U/L (ref 0–37)
Albumin: 2.5 g/dL — ABNORMAL LOW (ref 3.5–5.2)
Alkaline Phosphatase: 64 U/L (ref 39–117)
BUN: 10 mg/dL (ref 6–23)
CO2: 28 meq/L (ref 19–32)
CREATININE: 0.74 mg/dL (ref 0.50–1.35)
Calcium: 8.1 mg/dL — ABNORMAL LOW (ref 8.4–10.5)
Chloride: 102 mEq/L (ref 96–112)
GLUCOSE: 118 mg/dL — AB (ref 70–99)
POTASSIUM: 3.5 meq/L — AB (ref 3.7–5.3)
Sodium: 139 mEq/L (ref 137–147)
Total Bilirubin: 0.7 mg/dL (ref 0.3–1.2)
Total Protein: 6 g/dL (ref 6.0–8.3)

## 2013-09-30 MED ORDER — SODIUM CHLORIDE 0.9 % IV SOLN
1500.0000 mg | Freq: Two times a day (BID) | INTRAVENOUS | Status: DC
  Administered 2013-09-30 – 2013-10-02 (×5): 1500 mg via INTRAVENOUS
  Filled 2013-09-30 (×6): qty 1500
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
## 2013-09-30 NOTE — Care Management Note (Signed)
    Page 1 of 2   10/03/2013     11:42:20 AM CARE MANAGEMENT NOTE 10/03/2013  Patient:  Dustin Medina, Dustin Medina   Account Number:  [id number]  Date Initiated:  09/30/2013  Documentation initiated by:  Vladimir Creeks  Subjective/Objective Assessment:   Admitted with sepsis. Pt is a paraplegic, who lives alone, and  is completely independent. He self-caths, cares for his wounds, etc, and is very comfortable with his total care. He has a brother  who is available if needed.     Action/Plan:   Pt refuses HH. States he has had them before, but  was doing his own dressings and does not feel he needs them. No other needs identified   Anticipated DC Date:  10/03/2013   Anticipated DC Plan:  [place]  CM consult      [facility] Choice  HOME HEALTH   Choice offered to / List presented to:  C-1 Patient        [place] arranged  HH-1 RN      [place].   Status of service:  Completed, signed off Medicare Important Message given?  YES (If response is "NO", the following Medicare IM given date fields will be blank) Date Medicare IM given:  09/30/2013 Medicare IM given by:  Vladimir Creeks Date Additional Medicare IM given:  10/03/2013 Additional Medicare IM given by:  JESSICA CHILDRESS  Discharge Disposition:  [place]  Per UR Regulation:  Reviewed for med. necessity/level of care/duration of stay  If discussed at [place] of Stay Meetings, dates discussed:    Comments:  10/03/2013 [place], RN, MSN, PCCN Patient being discharged home with [facility] RN. Patient would like services through [facility]. Romualdo Bolk, of [facility], notified of referral and will obtain pt's information from chart. Patient notified that [facility] RN will have 48 hours after discharge to make home visit. Patient and pt's RN aware of discharge arrangments. Patient is paraplegic and has wound care needs but was previously independent at home with no [facility]  services and anticipates being again. Patient has no further CM needs at this time.  09/30/13 1445 Vladimir Creeks RN/CM

## 2013-09-30 NOTE — Progress Notes (Signed)
ANTIBIOTIC CONSULT NOTE - FOLLOW UP  Pharmacy Consult for Vancomycin & Zosyn Indication: rule out sepsis  No Known Allergies  Patient Measurements: Height: 5\' 11"  (180.3 cm) Weight: 165 lb 2 oz (74.9 kg) IBW/kg (Calculated) : 75.3  Vital Signs: Temp: 99.5 F (37.5 C) (08/14 1346) Temp src: Oral (08/14 1346) BP: 120/61 mmHg (08/14 1346) Pulse Rate: 84 (08/14 1346) Intake/Output from previous day: 08/13 0701 - 08/14 0700 In: 360 [P.O.:360] Out: 1001 [Urine:1000; Stool:1] Intake/Output from this shift: Total I/O In: 480 [P.O.:480] Out: 1400 [Urine:1400]  Labs:  Recent Labs  09/28/13 1940 09/29/13 0537 09/30/13 0621  WBC 22.1* 18.3* 11.4*  HGB 15.1 13.4 12.6*  PLT 230 223 208  CREATININE 0.60 0.74 0.74   Estimated Creatinine Clearance: 101.4 ml/min (by C-G formula based on Cr of 0.74). No results found for this basename: VANCOTROUGH, VANCOPEAK, VANCORANDOM, GENTTROUGH, GENTPEAK, GENTRANDOM, TOBRATROUGH, TOBRAPEAK, TOBRARND, AMIKACINPEAK, AMIKACINTROU, AMIKACIN,  in the last 72 hours   Microbiology: Recent Results (from the past 720 hour(s))  CULTURE, BLOOD (ROUTINE X 2)     Status: None   Collection Time    09/28/13  7:40 PM      Result Value Ref Range Status   Specimen Description BLOOD LEFT ARM DRAWN BY RN AEK   Final   Special Requests BOTTLES DRAWN AEROBIC AND ANAEROBIC [place]   Final   Culture NO GROWTH 2 DAYS   Final   Report Status PENDING   Incomplete  CULTURE, BLOOD (ROUTINE X 2)     Status: None   Collection Time    09/28/13  7:43 PM      Result Value Ref Range Status   Specimen Description BLOOD RIGHT ARM   Final   Special Requests BOTTLES DRAWN AEROBIC AND ANAEROBIC 10CC EACH   Final   Culture NO GROWTH 2 DAYS   Final   Report Status PENDING   Incomplete  URINE CULTURE     Status: None   Collection Time    09/28/13  8:18 PM      Result Value Ref Range Status   Specimen Description URINE, CATHETERIZED   Final   Special Requests NONE   Final   Culture  Setup Time     Final   Value: 09/29/2013 13:48     Performed at [facility]     Final   Value: NO GROWTH     Performed at [facility]   Culture     Final   Value: NO GROWTH     Performed at [facility]   Report Status 09/30/2013 FINAL   Final  MRSA PCR SCREENING     Status: None   Collection Time    09/29/13  2:13 AM      Result Value Ref Range Status   MRSA by PCR NEGATIVE  NEGATIVE Final   Comment:            The GeneXpert MRSA Assay (FDA     approved for NASAL specimens     only), is one component of a     comprehensive MRSA colonization     surveillance program. It is not     intended to diagnose MRSA     infection nor to guide or     monitor treatment for     MRSA infections.    Anti-infectives   Start     Dose/Rate Route Frequency Ordered Stop   09/30/13 1800  vancomycin (VANCOCIN)  1,500 mg in sodium chloride 0.9 % 500 mL IVPB     1,500 mg 250 mL/hr over 120 Minutes Intravenous Every 12 hours 09/30/13 1558     09/29/13 1200  piperacillin-tazobactam (ZOSYN) IVPB 3.375 g     3.375 g 12.5 mL/hr over 240 Minutes Intravenous Every 8 hours 09/29/13 0746     09/29/13 0900  vancomycin (VANCOCIN) 1,500 mg in sodium chloride 0.9 % 500 mL IVPB  Status:  Discontinued     1,500 mg 250 mL/hr over 120 Minutes Intravenous Every 12 hours 09/29/13 0746 09/30/13 0724   09/29/13 0245  piperacillin-tazobactam (ZOSYN) IVPB 3.375 g     3.375 g 12.5 mL/hr over 240 Minutes Intravenous  Once 09/29/13 0234 09/29/13 0721   09/28/13 2100  vancomycin (VANCOCIN) IVPB 1000 mg/200 mL premix     1,000 mg 200 mL/hr over 60 Minutes Intravenous  Once 09/28/13 2056 09/28/13 2251   09/28/13 2100  piperacillin-tazobactam (ZOSYN) IVPB 3.375 g     3.375 g 100 mL/hr over 30 Minutes Intravenous  Once 09/28/13 2056 09/28/13 2144     Assessment: 63 yo paraplegic M who presented with fever, nausea/vomiting, foul smelling urine, & back pain.  He  self-catheterizes and has hx multiple UTIs as well as hx +MRSA PCR and cellulitis/decubitus ulcer.   MRSA PCR is negative this admit.   He was febrile on admission (Tm 101.4) with elevated WBC.  Cx data pending.  Renal function is at patient's baseline with estimated CrCl >30ml/min.  Given that patient is paraplegic it is probable that renal function is over-estimated.    Vancomycin 8/12>>8/13 then restarted 8/14 >> Zosyn 8/12>>  Goal of Therapy:  Vancomycin trough level 15-20 mcg/ml  Plan:  Zosyn 3.375gm IV Q8h to be infused over 4hrs Vancomycin 1500mg  IV q12h Check Vancomycin trough at steady state Monitor renal function and cx data   Hart Robinsons A 09/30/2013,3:59 PM

## 2013-09-30 NOTE — Consult Note (Addendum)
WOC wound consult note Reason for Consult: evaluation of sacral wound, noted at the time of my assessment also to have pressure ulcer on the RLE. Pt paraplegic with history of sacral ulcer x 1 year.  He is up in a WC and fairly active.  His legs to rotate laterally which I think may be contributing to the new pressure ulcer. He is unaware of this area having issues with pressure.  Wound type: Stage III Pressure ulcer right malleolus Stage III Pressure ulcer sacrum Pressure Ulcer POA: Yes x 2  Measurement:  R malleolus: 1.5cm x 1.5cm x 0.5cm  Sacrum: 2.5cmx 2.5cm x 0.5cm  Wound bed: R malleolus: clean, pink, but pale Sacrum: hyperkertotic skin overgrowth with small fissure centrally, minimal depth.  Does not appear infected, chronic in nature do not feel based on this site I doubt it will ever heal.  Drainage (amount, consistency, odor) minimal at both sites, more at the RLE Periwound: intact Pt has limited subcutaneous tissue over the sacrum, buttocks, ischial wounds secondary to the paraplegia which makes him very high risk and he has continuous pressure on this area regardless of his WC cushion or home bed support.  Dressing procedure/placement/frequency: Silver hydrofiber to the open areas, cover with silicone foam. To protect, insulate, and at the RLE feel there may be bioburdan that the silver will assist with.  Change every other day.  After discussion with the patient will add Prevalon boot to offload/protect the RLE.  Pt is able to turn and reposition well in the bed therefore I will not add air mattress at this time.   Discussed POC with patient and bedside nurse.  Re consult if needed, will not follow at this time. Thanks  Goldie Tregoning [place], [place] [phone])

## 2013-10-01 LAB — COMPREHENSIVE METABOLIC PANEL
ALBUMIN: 2.3 g/dL — AB (ref 3.5–5.2)
ALK PHOS: 68 U/L (ref 39–117)
ALT: 15 U/L (ref 0–53)
AST: 17 U/L (ref 0–37)
Anion gap: 11 (ref 5–15)
BUN: 9 mg/dL (ref 6–23)
CHLORIDE: 104 meq/L (ref 96–112)
CO2: 27 mEq/L (ref 19–32)
Calcium: 7.8 mg/dL — ABNORMAL LOW (ref 8.4–10.5)
Creatinine, Ser: 0.71 mg/dL (ref 0.50–1.35)
GFR calc Af Amer: >90 mL/min (ref >90)
GFR calc non Af Amer: >90 mL/min (ref >90)
Glucose, Bld: 128 mg/dL — ABNORMAL HIGH (ref 70–99)
POTASSIUM: 2.8 meq/L — AB (ref 3.7–5.3)
Sodium: 142 mEq/L (ref 137–147)
TOTAL PROTEIN: 5.7 g/dL — AB (ref 6.0–8.3)
Total Bilirubin: 0.5 mg/dL (ref 0.3–1.2)

## 2013-10-01 LAB — CBC
HEMATOCRIT: 34.3 % — AB (ref 39.0–52.0)
Hemoglobin: 11.5 g/dL — ABNORMAL LOW (ref 13.0–17.0)
MCH: 29.3 pg (ref 26.0–34.0)
MCHC: 33.5 g/dL (ref 30.0–36.0)
MCV: 87.3 fL (ref 78.0–100.0)
PLATELETS: 191 10*3/uL (ref 150–400)
RBC: 3.93 MIL/uL — ABNORMAL LOW (ref 4.22–5.81)
RDW: 14.6 % (ref 11.5–15.5)
WBC: 7.8 10*3/uL (ref 4.0–10.5)

## 2013-10-01 MED ORDER — POTASSIUM CHLORIDE 10 MEQ/100ML IV SOLN
INTRAVENOUS | Status: AC
  Filled 2013-10-01: qty 100

## 2013-10-01 MED ORDER — POTASSIUM CHLORIDE 10 MEQ/100ML IV SOLN
10.0000 meq | INTRAVENOUS | Status: AC
  Administered 2013-10-01 (×4): 10 meq via INTRAVENOUS
  Filled 2013-10-01: qty 100
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
## 2013-10-02 LAB — COMPREHENSIVE METABOLIC PANEL
ALT: 15 U/L (ref 0–53)
AST: 17 U/L (ref 0–37)
Albumin: 2.4 g/dL — ABNORMAL LOW (ref 3.5–5.2)
Alkaline Phosphatase: 81 U/L (ref 39–117)
Anion gap: 9 (ref 5–15)
BUN: 6 mg/dL (ref 6–23)
CO2: 29 meq/L (ref 19–32)
Calcium: 8.1 mg/dL — ABNORMAL LOW (ref 8.4–10.5)
Chloride: 104 mEq/L (ref 96–112)
Creatinine, Ser: 0.72 mg/dL (ref 0.50–1.35)
GFR calc Af Amer: >90 mL/min (ref >90)
GFR calc non Af Amer: >90 mL/min (ref >90)
Glucose, Bld: 110 mg/dL — ABNORMAL HIGH (ref 70–99)
Potassium: 3.1 mEq/L — ABNORMAL LOW (ref 3.7–5.3)
Sodium: 142 mEq/L (ref 137–147)
TOTAL PROTEIN: 5.8 g/dL — AB (ref 6.0–8.3)
Total Bilirubin: 0.4 mg/dL (ref 0.3–1.2)

## 2013-10-02 LAB — CBC
HEMATOCRIT: 33.2 % — AB (ref 39.0–52.0)
HEMOGLOBIN: 11.3 g/dL — AB (ref 13.0–17.0)
MCH: 29.6 pg (ref 26.0–34.0)
MCHC: 34 g/dL (ref 30.0–36.0)
MCV: 86.9 fL (ref 78.0–100.0)
Platelets: 214 10*3/uL (ref 150–400)
RBC: 3.82 MIL/uL — AB (ref 4.22–5.81)
RDW: 14.6 % (ref 11.5–15.5)
WBC: 8.3 10*3/uL (ref 4.0–10.5)

## 2013-10-02 MED ORDER — POTASSIUM CHLORIDE CRYS ER 20 MEQ PO TBCR
40.0000 meq | EXTENDED_RELEASE_TABLET | Freq: Four times a day (QID) | ORAL | Status: AC
  Administered 2013-10-02 (×3): 40 meq via ORAL
  Filled 2013-10-02 (×3): qty 2
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
## 2013-10-03 DIAGNOSIS — R339 Retention of urine, unspecified: Secondary | ICD-10-CM

## 2013-10-03 DIAGNOSIS — I10 Essential (primary) hypertension: Secondary | ICD-10-CM

## 2013-10-03 LAB — CBC
HCT: 33.3 % — ABNORMAL LOW (ref 39.0–52.0)
Hemoglobin: 11.2 g/dL — ABNORMAL LOW (ref 13.0–17.0)
MCH: 29.3 pg (ref 26.0–34.0)
MCHC: 33.6 g/dL (ref 30.0–36.0)
MCV: 87.2 fL (ref 78.0–100.0)
PLATELETS: 251 10*3/uL (ref 150–400)
RBC: 3.82 MIL/uL — ABNORMAL LOW (ref 4.22–5.81)
RDW: 14.5 % (ref 11.5–15.5)
WBC: 9.6 10*3/uL (ref 4.0–10.5)

## 2013-10-03 LAB — BASIC METABOLIC PANEL
Anion gap: 11 (ref 5–15)
BUN: 7 mg/dL (ref 6–23)
CALCIUM: 8.4 mg/dL (ref 8.4–10.5)
CO2: 28 mEq/L (ref 19–32)
Chloride: 107 mEq/L (ref 96–112)
Creatinine, Ser: 0.8 mg/dL (ref 0.50–1.35)
GFR calc Af Amer: >90 mL/min (ref >90)
Glucose, Bld: 101 mg/dL — ABNORMAL HIGH (ref 70–99)
POTASSIUM: 3.9 meq/L (ref 3.7–5.3)
Sodium: 146 mEq/L (ref 137–147)

## 2013-10-03 LAB — CULTURE, BLOOD (ROUTINE X 2)
CULTURE: NO GROWTH
Culture: NO GROWTH

## 2013-10-03 LAB — VANCOMYCIN, TROUGH: VANCOMYCIN TR: 24.4 ug/mL — AB (ref 10.0–20.0)

## 2013-10-03 MED ORDER — VANCOMYCIN HCL IN DEXTROSE 1-5 GM/200ML-% IV SOLN
1000.0000 mg | Freq: Two times a day (BID) | INTRAVENOUS | Status: DC
Start: 2013-10-03 — End: 2013-10-03
  Filled 2013-10-03 (×2): qty 200

## 2013-10-03 MED ORDER — CLINDAMYCIN HCL 300 MG PO CAPS: 300.0000 mg | ORAL_CAPSULE | Freq: Three times a day (TID) | ORAL | Status: DC

## 2013-10-03 NOTE — Progress Notes (Signed)
Pt. Stated that he missed his appointment with [facility] on 09/30/13.  Per patient, notified [facility] to reschedule appointment.  Appointment has been rescheduled for September 15th at 09:30.  Patient has been notified.
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
## 2013-10-03 NOTE — Progress Notes (Addendum)
ANTIBIOTIC CONSULT NOTE - FOLLOW UP  Pharmacy Consult for Vancomycin & Zosyn Indication: rule out sepsis  No Known Allergies  Patient Measurements: Height: 5\' 11"  (180.3 cm) Weight: 178 lb 12.7 oz (81.1 kg) IBW/kg (Calculated) : 75.3  Vital Signs: Temp: 98.5 F (36.9 C) (08/16 2146) Temp src: Oral (08/16 2146) BP: 130/63 mmHg (08/16 2146) Pulse Rate: 75 (08/16 2146) Intake/Output from previous day: 08/16 0701 - 08/17 0700 In: 820 [P.O.:720; IV Piggyback:100] Out: 2550 [Urine:2550] Intake/Output from this shift:    Labs:  Recent Labs  10/01/13 0545 10/02/13 0654 10/03/13 0517  WBC 7.8 8.3 9.6  HGB 11.5* 11.3* 11.2*  PLT 191 214 251  CREATININE 0.71 0.72 0.80   Estimated Creatinine Clearance: 102 ml/min (by C-G formula based on Cr of 0.8).  Recent Labs  10/03/13 0517  VANCOTROUGH 24.4*    Microbiology: Recent Results (from the past 720 hour(s))  CULTURE, BLOOD (ROUTINE X 2)     Status: None   Collection Time    09/28/13  7:40 PM      Result Value Ref Range Status   Specimen Description BLOOD LEFT ARM DRAWN BY RN AEK   Final   Special Requests BOTTLES DRAWN AEROBIC AND ANAEROBIC 6CC EACH   Final   Culture NO GROWTH 4 DAYS   Final   Report Status PENDING   Incomplete  CULTURE, BLOOD (ROUTINE X 2)     Status: None   Collection Time    09/28/13  7:43 PM      Result Value Ref Range Status   Specimen Description BLOOD RIGHT ARM   Final   Special Requests BOTTLES DRAWN AEROBIC AND ANAEROBIC 10CC EACH   Final   Culture NO GROWTH 4 DAYS   Final   Report Status PENDING   Incomplete  URINE CULTURE     Status: None   Collection Time    09/28/13  8:18 PM      Result Value Ref Range Status   Specimen Description URINE, CATHETERIZED   Final   Special Requests NONE   Final   Culture  Setup Time     Final   Value: 09/29/2013 13:48     Performed at [place]     Final   Value: NO GROWTH     Performed at [facility]   Culture      Final   Value: NO GROWTH     Performed at [facility]   Report Status 09/30/2013 FINAL   Final  MRSA PCR SCREENING     Status: None   Collection Time    09/29/13  2:13 AM      Result Value Ref Range Status   MRSA by PCR NEGATIVE  NEGATIVE Final   Comment:            The GeneXpert MRSA Assay (FDA     approved for NASAL specimens     only), is one component of a     comprehensive MRSA colonization     surveillance program. It is not     intended to diagnose MRSA     infection nor to guide or     monitor treatment for     MRSA infections.    Anti-infectives   Start     Dose/Rate Route Frequency Ordered Stop   10/03/13 1800  vancomycin (VANCOCIN) IVPB 1000 mg/200 mL premix     1,000 mg 200 mL/hr over 60 Minutes Intravenous Every  12 hours 10/03/13 0737     09/30/13 1800  vancomycin (VANCOCIN) 1,500 mg in sodium chloride 0.9 % 500 mL IVPB  Status:  Discontinued     1,500 mg 250 mL/hr over 120 Minutes Intravenous Every 12 hours 09/30/13 1558 10/03/13 0737   09/29/13 1200  piperacillin-tazobactam (ZOSYN) IVPB 3.375 g     3.375 g 12.5 mL/hr over 240 Minutes Intravenous Every 8 hours 09/29/13 0746     09/29/13 0900  vancomycin (VANCOCIN) 1,500 mg in sodium chloride 0.9 % 500 mL IVPB  Status:  Discontinued     1,500 mg 250 mL/hr over 120 Minutes Intravenous Every 12 hours 09/29/13 0746 09/30/13 0724   09/29/13 0245  piperacillin-tazobactam (ZOSYN) IVPB 3.375 g     3.375 g 12.5 mL/hr over 240 Minutes Intravenous  Once 09/29/13 0234 09/29/13 0721   09/28/13 2100  vancomycin (VANCOCIN) IVPB 1000 mg/200 mL premix     1,000 mg 200 mL/hr over 60 Minutes Intravenous  Once 09/28/13 2056 09/28/13 2251   09/28/13 2100  piperacillin-tazobactam (ZOSYN) IVPB 3.375 g     3.375 g 100 mL/hr over 30 Minutes Intravenous  Once 09/28/13 2056 09/28/13 2144     Assessment: 63 yo paraplegic M who presented with fever, nausea/vomiting, foul smelling urine, & back pain.  He self-catheterizes and  has hx multiple UTIs as well as hx +MRSA PCR and cellulitis/decubitus ulcer.   MRSA PCR is negative this admit.   Pt is currently afebrile.  Renal fxn is stable.  WBC normal. Trough level is above goal.    Vancomycin 8/12>>8/13 then restarted 8/14 >> Zosyn 8/12>>  Goal of Therapy:  Vancomycin trough level 15-20 mcg/ml  Plan:  Zosyn 3.375gm IV Q8h to be infused over 4hrs Decrease Vancomycin to 1000mg  IV q12h Check Vancomycin trough level weekly or sooner if indicated Deescalate antibiotics when improved / appropriate.   Monitor renal function and cx data   Hart Robinsons A 10/03/2013,7:37 AM

## 2013-10-04 ENCOUNTER — Encounter: Payer: Medicare HMO | Admitting: *Deleted

## 2013-10-04 NOTE — Progress Notes (Signed)
UR review complete.  

## 2013-10-06 ENCOUNTER — Ambulatory Visit: Payer: Medicare HMO | Admitting: Family Medicine

## 2013-10-06 ENCOUNTER — Encounter: Payer: Medicare HMO | Admitting: *Deleted

## 2013-10-06 ENCOUNTER — Telehealth: Payer: Self-pay | Admitting: Family Medicine

## 2013-10-06 NOTE — Telephone Encounter (Signed)
FYI

## 2013-10-10 ENCOUNTER — Ambulatory Visit (INDEPENDENT_AMBULATORY_CARE_PROVIDER_SITE_OTHER): Payer: Medicare HMO | Admitting: Nurse Practitioner

## 2013-10-10 ENCOUNTER — Encounter: Payer: Self-pay | Admitting: Nurse Practitioner

## 2013-10-10 VITALS — BP 144/79 | HR 85 | Temp 99.3°F | Ht 71.0 in

## 2013-10-10 DIAGNOSIS — F17200 Nicotine dependence, unspecified, uncomplicated: Secondary | ICD-10-CM

## 2013-10-10 DIAGNOSIS — I10 Essential (primary) hypertension: Secondary | ICD-10-CM

## 2013-10-10 DIAGNOSIS — R339 Retention of urine, unspecified: Secondary | ICD-10-CM

## 2013-10-10 DIAGNOSIS — G8220 Paraplegia, unspecified: Secondary | ICD-10-CM

## 2013-10-10 DIAGNOSIS — E291 Testicular hypofunction: Secondary | ICD-10-CM

## 2013-10-10 DIAGNOSIS — M549 Dorsalgia, unspecified: Secondary | ICD-10-CM

## 2013-10-10 DIAGNOSIS — R7989 Other specified abnormal findings of blood chemistry: Secondary | ICD-10-CM

## 2013-10-10 DIAGNOSIS — K2100 Gastro-esophageal reflux disease with esophagitis, without bleeding: Secondary | ICD-10-CM

## 2013-10-10 DIAGNOSIS — G8929 Other chronic pain: Secondary | ICD-10-CM

## 2013-10-10 DIAGNOSIS — E785 Hyperlipidemia, unspecified: Secondary | ICD-10-CM

## 2013-10-10 MED ORDER — OMEPRAZOLE 20 MG PO CPDR: 20.0000 mg | DELAYED_RELEASE_CAPSULE | Freq: Two times a day (BID) | ORAL | Status: DC

## 2013-10-10 NOTE — Patient Instructions (Signed)

## 2013-10-10 NOTE — Progress Notes (Signed)
Subjective:    Patient ID: Dustin Medina, male    DOB: Dec 25, 1950, 63 y.o.   MRN: [id number]  Patient here today for follow up of chronic medical problems.   Hypertension This is a chronic problem. The current episode started more than 1 year ago. The problem is unchanged. The problem is uncontrolled. Pertinent negatives include no blurred vision, chest pain, headaches, palpitations, peripheral edema or shortness of breath. There are no associated agents to hypertension. Risk factors for coronary artery disease include dyslipidemia, family history, male gender, sedentary lifestyle and smoking/tobacco exposure. Past treatments include angiotensin blockers and diuretics. The current treatment provides moderate improvement. Compliance problems include diet and exercise.   Hyperlipidemia This is a chronic problem. The current episode started more than 1 year ago. The problem is uncontrolled. Recent lipid tests were reviewed and are high. He has no history of diabetes, hypothyroidism or obesity. Pertinent negatives include no chest pain or shortness of breath. Current antihyperlipidemic treatment includes statins. The current treatment provides moderate improvement of lipids. Compliance problems include adherence to diet and adherence to exercise.  Risk factors for coronary artery disease include dyslipidemia, family history, hypertension, male sex and a sedentary lifestyle.  GERD/ Hx GI Bleed Currently on omeprazole which seems to be working well. Decubitus ulcer on back  Currently not on any meds- has home health is cleaning 2x a week and is getting smaller  Cellulitis Was in the hospital and got iv antibiotics he is much better  Still on cleocin orally at home. Urinary retention Takes a daily bacrtim to prevent urinary retention Chronic back pain Sees Dr. Rolena Infante and they want him to have surgery at T12 level - they think that it will help with pain- curntly on ASA and ibuprofen.     Review  of Systems  Constitutional: Positive for fatigue.  HENT: Negative.   Eyes: Negative for blurred vision.  Respiratory: Negative for shortness of breath.   Cardiovascular: Negative for chest pain and palpitations.  Genitourinary: Negative.   Neurological: Negative.  Negative for headaches.  Psychiatric/Behavioral: Negative.   All other systems reviewed and are negative.      Objective:   Physical Exam  Constitutional: He is oriented to person, place, and time. He appears well-developed and well-nourished.  HENT:  Head: Normocephalic.  Right Ear: External ear normal.  Left Ear: External ear normal.  Nose: Nose normal.  Mouth/Throat: Oropharynx is clear and moist.  Eyes: EOM are normal. Pupils are equal, round, and reactive to light.  Neck: Normal range of motion. Neck supple. No JVD present. No thyromegaly present.  Cardiovascular: Normal rate, regular rhythm, normal heart sounds and intact distal pulses.  Exam reveals no gallop and no friction rub.   No murmur heard. Pulmonary/Chest: Effort normal and breath sounds normal. No respiratory distress. He has no wheezes. He has no rales. He exhibits no tenderness.  Abdominal: Soft. Bowel sounds are normal. He exhibits no mass. There is no tenderness.  Genitourinary: Prostate normal and penis normal.  Musculoskeletal: Normal range of motion. He exhibits no edema.  In a wheel chair-  No use of lower ext.  Lymphadenopathy:    He has no cervical adenopathy.  Neurological: He is alert and oriented to person, place, and time. No cranial nerve deficit.  Skin: Skin is warm and dry.  Psychiatric: He has a normal mood and affect. His behavior is normal. Judgment and thought content normal.    BP 144/79  Pulse 85  Temp(Src) 99.3 F (  37.4 C) (Oral)  Ht 5' 11"  (1.803 m)       Assessment & Plan:   1. HYPERTENSION   2. HYPERLIPIDEMIA   3. Urinary retention   4. Gastroesophageal reflux disease with esophagitis   5. Low testosterone     6. Paraplegic spinal paralysis   7. Tobacco smoker within last 12 months   8. Chronic back pain    Orders Placed This Encounter  Procedures  . CMP14+EGFR  . NMR, lipoprofile  . PSA, total and free   Meds ordered this encounter  Medications  . omeprazole (PRILOSEC) 20 MG capsule    Sig: Take 1 capsule (20 mg total) by mouth 2 (two) times daily before a meal.    Dispense:  60 capsule    Refill:  5    Order Specific Question:  Supervising Provider    Answer:  Chipper Herb [[id number]]    Labs pending Health maintenance reviewed Diet and exercise encouraged Continue all meds Follow up  In 3 month    Hydro, FNP

## 2013-10-11 LAB — CMP14+EGFR
ALT: 13 IU/L (ref 0–44)
AST: 12 IU/L (ref 0–40)
Albumin/Globulin Ratio: 1.2 (ref 1.1–2.5)
Albumin: 3.5 g/dL — ABNORMAL LOW (ref 3.6–4.8)
Alkaline Phosphatase: 94 IU/L (ref 39–117)
BUN/Creatinine Ratio: 21 (ref 10–22)
BUN: 16 mg/dL (ref 8–27)
CO2: 25 mmol/L (ref 18–29)
Calcium: 9 mg/dL (ref 8.6–10.2)
Chloride: 95 mmol/L — ABNORMAL LOW (ref 97–108)
Creatinine, Ser: 0.76 mg/dL (ref 0.76–1.27)
GFR calc Af Amer: 113 mL/min/{1.73_m2} (ref >59)
GFR calc non Af Amer: 98 mL/min/{1.73_m2} (ref >59)
GLUCOSE: 79 mg/dL (ref 65–99)
Globulin, Total: 3 g/dL (ref 1.5–4.5)
POTASSIUM: 3.6 mmol/L (ref 3.5–5.2)
SODIUM: 140 mmol/L (ref 134–144)
Total Bilirubin: 0.4 mg/dL (ref 0.0–1.2)
Total Protein: 6.5 g/dL (ref 6.0–8.5)

## 2013-10-11 LAB — PSA, TOTAL AND FREE
PSA FREE PCT: 12.3 %
PSA FREE: 1.84 ng/mL
PSA: 15 ng/mL — ABNORMAL HIGH (ref 0.0–4.0)

## 2013-10-11 LAB — NMR, LIPOPROFILE
Cholesterol: 161 mg/dL (ref 100–199)
HDL CHOLESTEROL BY NMR: 32 mg/dL — AB (ref >39)
HDL Particle Number: 19.3 umol/L — ABNORMAL LOW (ref >=30.5)
LDL PARTICLE NUMBER: 1396 nmol/L — AB (ref <1000)
LDL Size: 19.8 nm (ref >20.5)
LDLC SERPL CALC-MCNC: 104 mg/dL — ABNORMAL HIGH (ref 0–99)
LP-IR Score: 52 — ABNORMAL HIGH (ref <=45)
Small LDL Particle Number: 1058 nmol/L — ABNORMAL HIGH (ref <=527)
Triglycerides by NMR: 126 mg/dL (ref 0–149)

## 2013-10-12 ENCOUNTER — Telehealth: Payer: Self-pay | Admitting: *Deleted

## 2013-10-28 ENCOUNTER — Other Ambulatory Visit: Payer: Self-pay

## 2013-10-28 ENCOUNTER — Encounter: Payer: Commercial Managed Care - HMO | Admitting: Physical Medicine & Rehabilitation

## 2013-11-16 DIAGNOSIS — L89109 Pressure ulcer of unspecified part of back, unspecified stage: Secondary | ICD-10-CM

## 2013-11-16 DIAGNOSIS — L8993 Pressure ulcer of unspecified site, stage 3: Secondary | ICD-10-CM

## 2013-11-16 DIAGNOSIS — L03119 Cellulitis of unspecified part of limb: Secondary | ICD-10-CM

## 2013-11-16 DIAGNOSIS — L02419 Cutaneous abscess of limb, unspecified: Secondary | ICD-10-CM

## 2013-11-16 DIAGNOSIS — L89509 Pressure ulcer of unspecified ankle, unspecified stage: Secondary | ICD-10-CM

## 2013-11-18 ENCOUNTER — Telehealth: Payer: Self-pay | Admitting: Nurse Practitioner

## 2013-11-18 MED ORDER — VALSARTAN-HYDROCHLOROTHIAZIDE 160-12.5 MG PO TABS
1.0000 | ORAL_TABLET | Freq: Every day | ORAL | Status: DC
Start: 2013-11-18 — End: 2014-04-20

## 2013-11-18 NOTE — Telephone Encounter (Signed)
done

## 2013-11-21 ENCOUNTER — Encounter: Payer: Self-pay | Admitting: Physical Medicine & Rehabilitation

## 2013-11-21 NOTE — Progress Notes (Signed)
No visit

## 2013-11-23 HISTORY — PX: OTHER SURGICAL HISTORY: SHX169

## 2013-11-24 ENCOUNTER — Other Ambulatory Visit: Payer: Self-pay | Admitting: *Deleted

## 2013-11-24 MED ORDER — TRIMETHOPRIM 100 MG PO TABS: 100.0000 mg | ORAL_TABLET | Freq: Every day | ORAL | Status: DC

## 2013-11-29 ENCOUNTER — Encounter: Payer: Self-pay | Admitting: Nurse Practitioner

## 2013-11-29 ENCOUNTER — Ambulatory Visit (INDEPENDENT_AMBULATORY_CARE_PROVIDER_SITE_OTHER): Payer: Medicare HMO | Admitting: Nurse Practitioner

## 2013-11-29 VITALS — BP 146/90 | HR 82 | Temp 98.1°F

## 2013-11-29 DIAGNOSIS — L89320 Pressure ulcer of left buttock, unstageable: Secondary | ICD-10-CM

## 2013-11-29 MED ORDER — CEFTRIAXONE SODIUM 1 G IJ SOLR
1.0000 g | INTRAMUSCULAR | Status: AC
  Administered 2013-11-29: 1 g via INTRAMUSCULAR

## 2013-11-29 NOTE — Patient Instructions (Signed)
Pressure Ulcer A pressure ulcer is a sore that has formed from the breakdown of skin and exposure of deeper layers of tissue. It develops in areas of the body where there is unrelieved pressure. Pressure ulcers are usually found over a bony area, such as the shoulder blades, spine, lower back, hips, knees, ankles, and heels. Pressure ulcers vary in severity. Your health care provider may determine the severity (stage) of your pressure ulcer. The stages include:  Stage I--The skin is red, and when the skin is pressed, it stays red.  Stage II--The top layer of skin is gone, and there is a shallow, pink ulcer.  Stage III--The ulcer becomes deeper, and it is more difficult to see the whole wound. Also, there may be yellow or brown parts, as well as pink and red parts.  Stage IV--The ulcer may be deep and red, pink, brown, white, or yellow. Bone or muscle may be seen.  Unstageable pressure ulcer--The ulcer is covered almost completely with black, brown, or yellow tissue. It is not known how deep the ulcer is or what stage it is until this covering comes off.  Suspected deep tissue injury--A person's skin can be injured from pressure or pulling on the skin when his or her position is changed. The skin appears purple or maroon. There may not be an opening in the skin, but there could be a blood-filled blister. This deep tissue injury is often difficult to see in people with darker skin tones. The site may open and become deeper in time. However, early interventions will help the area heal and may prevent the area from opening. CAUSES  Pressure ulcers are caused by pressure against the skin that limits the flow of blood to the skin and nearby tissues. There are many risk factors that can lead to pressure sores. RISK FACTORS  Decreased ability to move.  Decreased ability to feel pain or discomfort.  Excessive skin moisture from urine, stool, sweat, or secretions.  Poor  nutrition.  Dehydration.  Tobacco, drug, or alcohol abuse.  Having someone pull on bedsheets that are under you, such as when health care workers are changing your position in a hospital bed.  Obesity.  Increased adult age.  Hospitalization in a critical care unit for longer than 4 days with use of medical devices.  Prolonged use of medical devices.  Critical illness.  Anemia.  Traumatic brain injury.  Spinal cord injury.  Stroke.  Diabetes.  Poor blood glucose control.  Low blood pressure (hypotension).  Low oxygen levels.  Medicines that reduce blood flow.  Infection. DIAGNOSIS  Your health care provider will diagnose your pressure ulcer based on its appearance. The health care provider may determine the stage of your pressure ulcer as well. Tests may be done to check for infection, to assess your circulation, or to check for other diseases, such as diabetes. TREATMENT  Treatment of your pressure ulcer begins with determining what stage the ulcer is in. Your treatment team may include your health care provider, a wound care specialist, a nutritionist, a physical therapist, and a surgeon. Possible treatments may include:   Moving or repositioning every 1-2 hours.  Using beds or mattresses to shift your body weight and pressure points frequently.  Improving your diet.  Cleaning and bandaging (dressing) the open wound.  Giving antibiotic medicines.  Removing damaged tissue.  Surgery and sometimes skin grafts. HOME CARE INSTRUCTIONS  If you were hospitalized, follow the care plan that was started in the hospital.    Avoid staying in the same position for more than 2 hours. Use padding, devices, or mattresses to cushion your pressure points as directed by your health care provider.  Eat a well-balanced diet. Take nutritional supplements and vitamins as directed by your health care provider.  Keep all follow-up appointments.  Only take over-the-counter or  prescription medicines for pain, fever, or discomfort as directed by your health care provider. SEEK MEDICAL CARE IF:   Your pressure ulcer is not improving.  You do not know how to care for your pressure ulcer.  You notice other areas of redness on your skin.  You have a fever. SEEK IMMEDIATE MEDICAL CARE IF:   You have increasing redness, swelling, or pain in your pressure ulcer.  You notice pus coming from your pressure ulcer.  You notice a bad smell coming from the wound or dressing.  Your pressure ulcer opens up again. Document Released: 02/03/2005 Document Revised: 02/08/2013 Document Reviewed: 10/11/2012 [employer] Patient Information 2015 [employer]. This information is not intended to replace advice given to you by your health care provider. Make sure you discuss any questions you have with your health care provider.  

## 2013-11-29 NOTE — Progress Notes (Signed)
   Subjective:    Patient ID: Dustin Medina, male    DOB: 1950-06-15, 63 y.o.   MRN: [id number]  HPI Patient in today c/o sore place on back- Has had a decubitus on sacrum but now has a place on left buttocks- He cannot see it.    Review of Systems  Constitutional: Negative.   HENT: Negative.   Respiratory: Negative.   Cardiovascular: Negative.   Genitourinary: Negative.   Neurological: Negative.   Hematological: Negative.   Psychiatric/Behavioral: Negative.   All other systems reviewed and are negative.      Objective:   Physical Exam  Constitutional: He is oriented to person, place, and time. He appears well-developed and well-nourished.  Cardiovascular: Normal rate, regular rhythm and normal heart sounds.   Pulmonary/Chest: Effort normal and breath sounds normal.  Neurological: He is alert and oriented to person, place, and time.  Skin: Skin is warm.  Large abscess on left buttocks- decubitus   BP 146/90  Pulse 82  Temp(Src) 98.1 F (36.7 C) (Oral)        Assessment & Plan:   1. Decubitus ulcer of buttock, left, unstageable    Meds ordered this encounter  Medications  . cefTRIAXone (ROCEPHIN) injection 1 g    Sig:     Order Specific Question:  Antibiotic Indication:    Answer:  Cellulitis    Order Specific Question:  Other Indication:    Answer:  left buttocks   Orders Placed This Encounter  Procedures  . AMB referral to wound care center    Referral Priority:  Urgent    Referral Type:  Consultation    Number of Visits Requested:  1   To wound care center  [place], [place]

## 2013-12-06 ENCOUNTER — Encounter
Payer: Commercial Managed Care - HMO | Attending: Physical Medicine & Rehabilitation | Admitting: Physical Medicine & Rehabilitation

## 2013-12-06 ENCOUNTER — Encounter: Payer: Self-pay | Admitting: Physical Medicine & Rehabilitation

## 2013-12-06 VITALS — BP 159/97 | HR 83 | Resp 14 | Ht 71.0 in

## 2013-12-06 DIAGNOSIS — G8929 Other chronic pain: Secondary | ICD-10-CM | POA: Diagnosis present

## 2013-12-06 DIAGNOSIS — G8220 Paraplegia, unspecified: Secondary | ICD-10-CM

## 2013-12-06 DIAGNOSIS — K592 Neurogenic bowel, not elsewhere classified: Secondary | ICD-10-CM | POA: Diagnosis not present

## 2013-12-06 DIAGNOSIS — L89152 Pressure ulcer of sacral region, stage 2: Secondary | ICD-10-CM | POA: Insufficient documentation

## 2013-12-06 DIAGNOSIS — S24104A Unspecified injury at T11-T12 level of thoracic spinal cord, initial encounter: Secondary | ICD-10-CM | POA: Insufficient documentation

## 2013-12-06 DIAGNOSIS — M549 Dorsalgia, unspecified: Secondary | ICD-10-CM

## 2013-12-06 DIAGNOSIS — N319 Neuromuscular dysfunction of bladder, unspecified: Secondary | ICD-10-CM | POA: Insufficient documentation

## 2013-12-06 DIAGNOSIS — M545 Low back pain: Secondary | ICD-10-CM | POA: Diagnosis not present

## 2013-12-06 DIAGNOSIS — G5601 Carpal tunnel syndrome, right upper limb: Secondary | ICD-10-CM | POA: Insufficient documentation

## 2013-12-06 DIAGNOSIS — L89102 Pressure ulcer of unspecified part of back, stage 2: Secondary | ICD-10-CM

## 2013-12-06 NOTE — Patient Instructions (Signed)
PLEASE CALL ME WITH ANY PROBLEMS OR QUESTIONS (#[phone]).      IBUPROFEN 800MG  UP TO THREE XDAILY  NAPROXEN 440MG  UP TO TWICE DAILY
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
## 2013-12-08 ENCOUNTER — Encounter (HOSPITAL_BASED_OUTPATIENT_CLINIC_OR_DEPARTMENT_OTHER): Payer: Medicare HMO | Attending: Internal Medicine

## 2013-12-30 ENCOUNTER — Ambulatory Visit (INDEPENDENT_AMBULATORY_CARE_PROVIDER_SITE_OTHER): Payer: Medicare HMO | Admitting: Physician Assistant

## 2013-12-30 ENCOUNTER — Encounter: Payer: Self-pay | Admitting: Physician Assistant

## 2013-12-30 ENCOUNTER — Telehealth: Payer: Self-pay | Admitting: Family Medicine

## 2013-12-30 VITALS — BP 163/89 | HR 79 | Temp 97.4°F

## 2013-12-30 DIAGNOSIS — N3090 Cystitis, unspecified without hematuria: Secondary | ICD-10-CM

## 2013-12-30 DIAGNOSIS — R300 Dysuria: Secondary | ICD-10-CM

## 2013-12-30 LAB — POCT UA - MICROSCOPIC ONLY
Casts, Ur, LPF, POC: NEGATIVE
Crystals, Ur, HPF, POC: NEGATIVE
YEAST UA: NEGATIVE

## 2013-12-30 LAB — POCT URINALYSIS DIPSTICK
Bilirubin, UA: NEGATIVE
Glucose, UA: NEGATIVE
Ketones, UA: NEGATIVE
Nitrite, UA: POSITIVE
PROTEIN UA: NEGATIVE
Spec Grav, UA: 1.01
UROBILINOGEN UA: NEGATIVE
pH, UA: 8

## 2013-12-30 MED ORDER — NITROFURANTOIN MONOHYD MACRO 100 MG PO CAPS: 100.0000 mg | ORAL_CAPSULE | Freq: Two times a day (BID) | ORAL | Status: DC

## 2013-12-30 NOTE — Telephone Encounter (Signed)
Appt given for today per patients request 

## 2013-12-30 NOTE — Progress Notes (Signed)
Subjective:     Patient ID: Dustin Medina, male   DOB: 1950/03/16, 63 y.o.   MRN: [id number]  HPI 63 y/o paraplegic presents with c/o cloudy urine, burning with urination and foul urine odor. Frequently has UTI's.    Review of Systems  Constitutional: Negative.   Genitourinary: Positive for dysuria.       Objective:   Physical Exam  Abdominal: Soft. He exhibits no distension and no mass. There is no tenderness. There is no rebound and no guarding.  Nursing note and vitals reviewed.      Assessment:    1. Acute Cystitis    Plan:     Rx Macrobid 100mg  BID x 7 days. F/U for reassesment in 1-2 weeks. Drink plenty of noncaffeinated fluids. RTC sooner if pain worsens, inability to urinate or high fever.

## 2014-01-09 ENCOUNTER — Encounter: Payer: Self-pay | Admitting: Physician Assistant

## 2014-01-16 DIAGNOSIS — G8220 Paraplegia, unspecified: Secondary | ICD-10-CM

## 2014-01-16 DIAGNOSIS — L03116 Cellulitis of left lower limb: Secondary | ICD-10-CM

## 2014-01-16 DIAGNOSIS — L89153 Pressure ulcer of sacral region, stage 3: Secondary | ICD-10-CM

## 2014-01-16 DIAGNOSIS — L89513 Pressure ulcer of right ankle, stage 3: Secondary | ICD-10-CM

## 2014-02-03 ENCOUNTER — Other Ambulatory Visit: Payer: Self-pay | Admitting: Nurse Practitioner

## 2014-02-03 ENCOUNTER — Encounter: Payer: Medicare HMO | Admitting: Physical Medicine & Rehabilitation

## 2014-02-03 NOTE — Telephone Encounter (Signed)
Seen 11-29-13 - MMM

## 2014-02-06 ENCOUNTER — Other Ambulatory Visit: Payer: Self-pay | Admitting: Family Medicine

## 2014-02-14 ENCOUNTER — Encounter: Payer: Self-pay | Admitting: Nurse Practitioner

## 2014-02-14 ENCOUNTER — Ambulatory Visit (INDEPENDENT_AMBULATORY_CARE_PROVIDER_SITE_OTHER): Payer: Medicare HMO | Admitting: Nurse Practitioner

## 2014-02-14 ENCOUNTER — Telehealth: Payer: Self-pay | Admitting: Nurse Practitioner

## 2014-02-14 ENCOUNTER — Other Ambulatory Visit: Payer: Self-pay | Admitting: *Deleted

## 2014-02-14 VITALS — BP 149/81 | HR 85 | Temp 85.0°F

## 2014-02-14 DIAGNOSIS — N3000 Acute cystitis without hematuria: Secondary | ICD-10-CM

## 2014-02-14 DIAGNOSIS — R300 Dysuria: Secondary | ICD-10-CM

## 2014-02-14 LAB — POCT URINALYSIS DIPSTICK
BILIRUBIN UA: NEGATIVE
GLUCOSE UA: NEGATIVE
Ketones, UA: NEGATIVE
NITRITE UA: POSITIVE
PH UA: 6
Protein, UA: NEGATIVE
Spec Grav, UA: 1.01
Urobilinogen, UA: NEGATIVE

## 2014-02-14 LAB — POCT UA - MICROSCOPIC ONLY
CASTS, UR, LPF, POC: NEGATIVE
CRYSTALS, UR, HPF, POC: NEGATIVE
MUCUS UA: NEGATIVE
Yeast, UA: NEGATIVE

## 2014-02-14 MED ORDER — CIPROFLOXACIN HCL 500 MG PO TABS: 500.0000 mg | ORAL_TABLET | Freq: Two times a day (BID) | ORAL | Status: DC

## 2014-02-14 MED ORDER — TESTOSTERONE CYPIONATE 200 MG/ML IM SOLN: 150.0000 mg | INTRAMUSCULAR | Status: DC

## 2014-02-14 NOTE — Patient Instructions (Signed)

## 2014-02-14 NOTE — Telephone Encounter (Signed)
Testosterone rx ready for pick up

## 2014-02-14 NOTE — Progress Notes (Signed)
   Subjective:    Patient ID: Dustin Medina, male    DOB: 11-28-1950, 63 y.o.   MRN: [id number]  HPI Patient in c/o dysuria, frequency and urgency- He saw T.Gann mid November with same complaint and was given Macrobid- never really completely resolved.    Review of Systems  Constitutional: Negative.   HENT: Negative.   Respiratory: Negative.   Cardiovascular: Negative.   Gastrointestinal: Negative.   Genitourinary: Positive for dysuria, urgency and frequency.  Neurological: Negative.   Psychiatric/Behavioral: Negative.   All other systems reviewed and are negative.      Objective:   Physical Exam  Constitutional: He is oriented to person, place, and time. He appears well-developed and well-nourished.  Cardiovascular: Normal rate, regular rhythm and normal heart sounds.   Pulmonary/Chest: Effort normal and breath sounds normal.  Abdominal: Soft. Bowel sounds are normal.  Genitourinary:  No CVA tenderness  Neurological: He is alert and oriented to person, place, and time.  Skin: Skin is warm and dry.  Psychiatric: He has a normal mood and affect. His behavior is normal. Judgment and thought content normal.   BP 149/81 mmHg  Pulse 85  Temp(Src) 85 F (29.4 C) (Oral)  Wt   Results for orders placed or performed in visit on 12/30/13  POCT urinalysis dipstick  Result Value Ref Range   Color, UA yellow    Clarity, UA clear    Glucose, UA neg    Bilirubin, UA neg    Ketones, UA neg    Spec Grav, UA 1.010    Blood, UA trace    pH, UA 8.0    Protein, UA neg    Urobilinogen, UA negative    Nitrite, UA pos    Leukocytes, UA moderate (2+)   POCT UA - Microscopic Only  Result Value Ref Range   WBC, Ur, HPF, POC 15-20    RBC, urine, microscopic 5-7    Bacteria, U Microscopic many    Mucus, UA occ    Epithelial cells, urine per micros occ    Crystals, Ur, HPF, POC neg    Casts, Ur, LPF, POC neg    Yeast, UA neg           Assessment & Plan:   1. Dysuria   2.  Acute cystitis without hematuria    Meds ordered this encounter  Medications  . ciprofloxacin (CIPRO) 500 MG tablet    Sig: Take 1 tablet (500 mg total) by mouth 2 (two) times daily.    Dispense:  14 tablet    Refill:  0    Order Specific Question:  Supervising Provider    Answer:  Joycelyn Man   Force fluids AZO over the counter X2 days RTO prn Culture pending  [name] Hassell Done, FNP

## 2014-02-15 NOTE — Telephone Encounter (Signed)
Pt aware it is ready  

## 2014-02-16 LAB — URINE CULTURE

## 2014-02-17 ENCOUNTER — Other Ambulatory Visit: Payer: Self-pay | Admitting: Nurse Practitioner

## 2014-02-17 MED ORDER — NITROFURANTOIN MONOHYD MACRO 100 MG PO CAPS: 100.0000 mg | ORAL_CAPSULE | Freq: Two times a day (BID) | ORAL | Status: DC

## 2014-03-02 DIAGNOSIS — L89154 Pressure ulcer of sacral region, stage 4: Secondary | ICD-10-CM | POA: Diagnosis not present

## 2014-03-02 DIAGNOSIS — Z09 Encounter for follow-up examination after completed treatment for conditions other than malignant neoplasm: Secondary | ICD-10-CM | POA: Diagnosis not present

## 2014-03-02 DIAGNOSIS — G8221 Paraplegia, complete: Secondary | ICD-10-CM | POA: Diagnosis not present

## 2014-03-02 DIAGNOSIS — G8220 Paraplegia, unspecified: Secondary | ICD-10-CM | POA: Diagnosis not present

## 2014-03-02 DIAGNOSIS — Z872 Personal history of diseases of the skin and subcutaneous tissue: Secondary | ICD-10-CM | POA: Diagnosis not present

## 2014-03-03 ENCOUNTER — Encounter: Payer: Self-pay | Admitting: Physical Medicine & Rehabilitation

## 2014-03-03 ENCOUNTER — Encounter: Payer: Medicare HMO | Attending: Physical Medicine & Rehabilitation | Admitting: Physical Medicine & Rehabilitation

## 2014-03-03 VITALS — BP 123/78 | HR 89 | Resp 14

## 2014-03-03 DIAGNOSIS — M545 Low back pain: Secondary | ICD-10-CM | POA: Diagnosis not present

## 2014-03-03 DIAGNOSIS — L89152 Pressure ulcer of sacral region, stage 2: Secondary | ICD-10-CM | POA: Diagnosis not present

## 2014-03-03 DIAGNOSIS — G5601 Carpal tunnel syndrome, right upper limb: Secondary | ICD-10-CM | POA: Diagnosis not present

## 2014-03-03 DIAGNOSIS — G8929 Other chronic pain: Secondary | ICD-10-CM | POA: Diagnosis not present

## 2014-03-03 DIAGNOSIS — K592 Neurogenic bowel, not elsewhere classified: Secondary | ICD-10-CM | POA: Diagnosis not present

## 2014-03-03 DIAGNOSIS — G8220 Paraplegia, unspecified: Secondary | ICD-10-CM

## 2014-03-03 DIAGNOSIS — M654 Radial styloid tenosynovitis [de Quervain]: Secondary | ICD-10-CM | POA: Diagnosis not present

## 2014-03-03 DIAGNOSIS — S24104S Unspecified injury at T11-T12 level of thoracic spinal cord, sequela: Secondary | ICD-10-CM

## 2014-03-03 DIAGNOSIS — N319 Neuromuscular dysfunction of bladder, unspecified: Secondary | ICD-10-CM | POA: Diagnosis not present

## 2014-03-03 MED ORDER — MELOXICAM 15 MG PO TABS: 15.0000 mg | ORAL_TABLET | Freq: Every day | ORAL | Status: DC

## 2014-03-03 NOTE — Patient Instructions (Signed)
YOU NEED TO COMPROMISE AND USE SOME COMMON SENSE WITH THE USE OF YOUR HANDS  A WRIST SPLINT WHICH COVERS THE THE THUMB JOINT/KNUCKLE MAY HELP  ICE SHOULD HELP  I CAN INJECT YOUR WRIST IF NEEDED.   De Quervain's Tenosynovitis De Quervain's tenosynovitis involves inflammation of one or two tendon linings (sheaths) or strain of one or two tendons to the thumb: extensor pollicis brevis (EPB), or abductor pollicis longus (APL). This causes pain on the side of the wrist and base of the thumb. Tendon sheaths secrete a fluid that lubricates the tendon, allowing the tendon to move smoothly. When the sheath becomes inflamed, the tendon cannot move freely in the sheath. Both the EPB and APL tendons are important for proper use of the hand. The EPB tendon is important for straightening the thumb. The APL tendon is important for moving the thumb away from the index finger (abducting). The two tendons pass through a small tube (canal) in the wrist, near the base of the thumb. When the tendons become inflamed, pain is usually felt in this area. SYMPTOMS   Pain, tenderness, swelling, warmth, or redness over the base of the thumb and thumb side of the wrist.  Pain that gets worse when straightening the thumb.  Pain that gets worse when moving the thumb away from the index finger, against resistance.  Pain with pinching or gripping.  Locking or catching of the thumb.  Limited motion of the thumb.  Crackling sound (crepitation) when the tendon or thumb is moved or touched.  Fluid-filled cyst in the area of the base of the thumb. CAUSES   Tenosynovitis is often linked with overuse of the wrist.  Tenosynovitis may be caused by repeated injury to the thumb muscle and tendon units, and with repeated motions of the hand and wrist, due to friction of the tendon within the lining (sheath).  Tenosynovitis may also be due to a sudden increase in activity or change in activity. RISK INCREASES WITH:  Sports  that involve repeated hand and wrist motions (golf, bowling, tennis, squash, racquetball).  Heavy labor.  Poor physical wrist strength and flexibility.  Failure to warm up properly before practice or play.  Male gender.  New mothers who hold their baby's head for long periods or lift infants with thumbs in the infant's armpit (axilla). PREVENTION  Warm up and stretch properly before practice or competition.  Allow enough time for rest and recovery between practices and competition.  Maintain appropriate conditioning:  Cardiovascular fitness.  Forearm, wrist, and hand flexibility.  Muscle strength and endurance.  Use proper exercise technique. PROGNOSIS  This condition is usually curable within 6 weeks, if treated properly with non-surgical treatment and resting of the affected area.  RELATED COMPLICATIONS   Longer healing time if not properly treated or if not given enough time to heal.  Chronic inflammation, causing recurring symptoms of tenosynovitis. Permanent pain or restriction of movement.  Risks of surgery: infection, bleeding, injury to nerves (numbness of the thumb), continued pain, incomplete release of the tendon sheath, recurring symptoms, cutting of the tendons, tendons sliding out of position, weakness of the thumb, thumb stiffness. TREATMENT  First, treatment involves the use of medicine and ice, to reduce pain and inflammation. Patients are encouraged to stop or modify activities that aggravate the injury. Stretching and strengthening exercises may be advised. Exercises may be completed at home or with a therapist. You may be fitted with a brace or splint, to limit motion and allow the injury  to heal. Your caregiver may also choose to give you a corticosteroid injection, to reduce the pain and inflammation. If non-surgical treatment is not successful, surgery may be needed. Most tenosynovitis surgeries are done as outpatient procedures (you go home the same day).  Surgery may involve local, regional (whole arm), or general anesthesia.  MEDICATION   If pain medicine is needed, nonsteroidal anti-inflammatory medicines (aspirin and ibuprofen), or other minor pain relievers (acetaminophen), are often advised.  Do not take pain medicine for 7 days before surgery.  Prescription pain relievers are often prescribed only after surgery. Use only as directed and only as much as you need.  Corticosteroid injections may be given if your caregiver thinks they are needed. There is a limited number of times these injections may be given. COLD THERAPY   Cold treatment (icing) should be applied for 10 to 15 minutes every 2 to 3 hours for inflammation and pain, and immediately after activity that aggravates your symptoms. Use ice packs or an ice massage. SEEK MEDICAL CARE IF:   Symptoms get worse or do not improve in 2 to 4 weeks, despite treatment.  You experience pain, numbness, or coldness in the hand.  Blue, gray, or dark color appears in the fingernails.  Any of the following occur after surgery: increased pain, swelling, redness, drainage of fluids, bleeding in the affected area, or signs of infection.  New, unexplained symptoms develop. (Drugs used in treatment may produce side effects.) Document Released: 02/03/2005 Document Revised: 04/28/2011 Document Reviewed: 05/18/2008 [facility] Patient Information 2015 [place], [place]. This information is not intended to replace advice given to you by your health care provider. Make sure you discuss any questions you have with your health care provider.
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
## 2014-03-07 ENCOUNTER — Telehealth: Payer: Self-pay | Admitting: Nurse Practitioner

## 2014-03-07 DIAGNOSIS — R300 Dysuria: Secondary | ICD-10-CM

## 2014-03-07 NOTE — Telephone Encounter (Signed)
Please review and advise.

## 2014-03-08 ENCOUNTER — Other Ambulatory Visit: Payer: Medicare HMO

## 2014-03-08 DIAGNOSIS — R300 Dysuria: Secondary | ICD-10-CM

## 2014-03-08 MED ORDER — NITROFURANTOIN MONOHYD MACRO 100 MG PO CAPS: 100.0000 mg | ORAL_CAPSULE | Freq: Two times a day (BID) | ORAL | Status: DC

## 2014-03-08 NOTE — Telephone Encounter (Signed)
Pt was supposed to come leave another urine sample in 2 weeks, pt is in wheelchair. Pt states the Macrobid helped but he never really felt like the UTI went away, Pt on his way now to leave a urine sample will send rx for Macrobid to the pharmacy per Dietrich Pates until we get the urine cx back.

## 2014-03-09 NOTE — Telephone Encounter (Signed)
ok 

## 2014-03-10 LAB — URINE CULTURE

## 2014-03-13 ENCOUNTER — Telehealth: Payer: Self-pay | Admitting: Nurse Practitioner

## 2014-03-13 NOTE — Telephone Encounter (Signed)
Will you please review urine culture results

## 2014-03-13 NOTE — Telephone Encounter (Signed)
Patient aware of results.

## 2014-03-13 NOTE — Telephone Encounter (Signed)
Patient was given macrobid which culture says should take care of it.

## 2014-03-14 ENCOUNTER — Other Ambulatory Visit: Payer: Self-pay | Admitting: Family Medicine

## 2014-03-14 DIAGNOSIS — R300 Dysuria: Secondary | ICD-10-CM

## 2014-03-14 MED ORDER — NITROFURANTOIN MONOHYD MACRO 100 MG PO CAPS: 100.0000 mg | ORAL_CAPSULE | Freq: Two times a day (BID) | ORAL | Status: DC

## 2014-03-19 DIAGNOSIS — G8220 Paraplegia, unspecified: Secondary | ICD-10-CM | POA: Diagnosis not present

## 2014-03-19 DIAGNOSIS — L89153 Pressure ulcer of sacral region, stage 3: Secondary | ICD-10-CM | POA: Diagnosis not present

## 2014-03-19 DIAGNOSIS — L03116 Cellulitis of left lower limb: Secondary | ICD-10-CM | POA: Diagnosis not present

## 2014-03-19 DIAGNOSIS — L89513 Pressure ulcer of right ankle, stage 3: Secondary | ICD-10-CM | POA: Diagnosis not present

## 2014-03-27 ENCOUNTER — Other Ambulatory Visit (INDEPENDENT_AMBULATORY_CARE_PROVIDER_SITE_OTHER): Payer: Medicare HMO

## 2014-03-27 DIAGNOSIS — G8220 Paraplegia, unspecified: Secondary | ICD-10-CM | POA: Diagnosis not present

## 2014-03-27 DIAGNOSIS — R300 Dysuria: Secondary | ICD-10-CM

## 2014-03-27 DIAGNOSIS — N319 Neuromuscular dysfunction of bladder, unspecified: Secondary | ICD-10-CM | POA: Diagnosis not present

## 2014-03-27 DIAGNOSIS — S24103A Unspecified injury at T7-T10 level of thoracic spinal cord, initial encounter: Secondary | ICD-10-CM | POA: Diagnosis not present

## 2014-03-27 DIAGNOSIS — N3941 Urge incontinence: Secondary | ICD-10-CM | POA: Diagnosis not present

## 2014-03-27 DIAGNOSIS — S71009A Unspecified open wound, unspecified hip, initial encounter: Secondary | ICD-10-CM | POA: Diagnosis not present

## 2014-03-27 DIAGNOSIS — R339 Retention of urine, unspecified: Secondary | ICD-10-CM | POA: Diagnosis not present

## 2014-03-27 LAB — POCT URINALYSIS DIPSTICK
BILIRUBIN UA: NEGATIVE
Glucose, UA: NEGATIVE
Ketones, UA: NEGATIVE
Leukocytes, UA: NEGATIVE
NITRITE UA: NEGATIVE
PH UA: 6
RBC UA: NEGATIVE
Spec Grav, UA: 1.01
Urobilinogen, UA: NEGATIVE

## 2014-03-27 LAB — POCT UA - MICROSCOPIC ONLY
Bacteria, U Microscopic: NEGATIVE
Casts, Ur, LPF, POC: NEGATIVE
Crystals, Ur, HPF, POC: NEGATIVE
Epithelial cells, urine per micros: NEGATIVE
Mucus, UA: NEGATIVE
RBC, URINE, MICROSCOPIC: NEGATIVE
WBC, Ur, HPF, POC: NEGATIVE
Yeast, UA: NEGATIVE

## 2014-03-27 NOTE — Progress Notes (Signed)
LAB ONLY 

## 2014-03-29 ENCOUNTER — Telehealth: Payer: Self-pay | Admitting: Nurse Practitioner

## 2014-03-30 NOTE — Telephone Encounter (Signed)
Just use antibiotic ointment in nose. Can send in another urine if woud like or needs to see urology

## 2014-04-01 ENCOUNTER — Other Ambulatory Visit: Payer: Self-pay | Admitting: Physician Assistant

## 2014-04-07 ENCOUNTER — Telehealth: Payer: Self-pay | Admitting: Nurse Practitioner

## 2014-04-07 NOTE — Telephone Encounter (Signed)
Detailed message left to call us back if he still had any concerns if not he may disregard this message.

## 2014-04-07 NOTE — Telephone Encounter (Signed)
Stp advised to call urologist to see if they want him to come in or what they advise, pt voiced understanding.

## 2014-04-20 ENCOUNTER — Other Ambulatory Visit: Payer: Self-pay | Admitting: Nurse Practitioner

## 2014-04-25 DIAGNOSIS — R339 Retention of urine, unspecified: Secondary | ICD-10-CM | POA: Diagnosis not present

## 2014-04-25 DIAGNOSIS — N319 Neuromuscular dysfunction of bladder, unspecified: Secondary | ICD-10-CM | POA: Diagnosis not present

## 2014-04-25 DIAGNOSIS — S71009A Unspecified open wound, unspecified hip, initial encounter: Secondary | ICD-10-CM | POA: Diagnosis not present

## 2014-04-25 DIAGNOSIS — N3941 Urge incontinence: Secondary | ICD-10-CM | POA: Diagnosis not present

## 2014-04-26 ENCOUNTER — Other Ambulatory Visit: Payer: Self-pay | Admitting: Nurse Practitioner

## 2014-05-04 ENCOUNTER — Other Ambulatory Visit: Payer: Self-pay | Admitting: Family

## 2014-05-04 NOTE — Telephone Encounter (Signed)
Last seen 02/15/15 MMM  Last lipid 10/10/13

## 2014-05-07 ENCOUNTER — Other Ambulatory Visit: Payer: Self-pay | Admitting: Nurse Practitioner

## 2014-05-08 ENCOUNTER — Telehealth: Payer: Self-pay | Admitting: Nurse Practitioner

## 2014-05-08 MED ORDER — SULFAMETHOXAZOLE-TRIMETHOPRIM 800-160 MG PO TABS: 1.0000 | ORAL_TABLET | Freq: Two times a day (BID) | ORAL | Status: DC

## 2014-05-08 NOTE — Telephone Encounter (Signed)
Bactrim rx sent to pharmacy 

## 2014-05-09 NOTE — Telephone Encounter (Signed)
Patient aware that antibiotic to pharmacy.

## 2014-05-23 ENCOUNTER — Telehealth: Payer: Self-pay | Admitting: Nurse Practitioner

## 2014-05-30 ENCOUNTER — Other Ambulatory Visit: Payer: Self-pay | Admitting: *Deleted

## 2014-05-30 MED ORDER — SIMVASTATIN 40 MG PO TABS: 40.0000 mg | ORAL_TABLET | Freq: Every day | ORAL | Status: DC

## 2014-05-31 DIAGNOSIS — S71009A Unspecified open wound, unspecified hip, initial encounter: Secondary | ICD-10-CM | POA: Diagnosis not present

## 2014-05-31 DIAGNOSIS — R339 Retention of urine, unspecified: Secondary | ICD-10-CM | POA: Diagnosis not present

## 2014-05-31 DIAGNOSIS — N3941 Urge incontinence: Secondary | ICD-10-CM | POA: Diagnosis not present

## 2014-05-31 DIAGNOSIS — N319 Neuromuscular dysfunction of bladder, unspecified: Secondary | ICD-10-CM | POA: Diagnosis not present

## 2014-06-02 NOTE — Telephone Encounter (Signed)
Forms sent to [employer] 06/01/14, pt aware

## 2014-06-03 ENCOUNTER — Other Ambulatory Visit: Payer: Self-pay | Admitting: Physical Medicine & Rehabilitation

## 2014-06-06 ENCOUNTER — Ambulatory Visit (INDEPENDENT_AMBULATORY_CARE_PROVIDER_SITE_OTHER): Payer: Commercial Managed Care - HMO | Admitting: Nurse Practitioner

## 2014-06-06 ENCOUNTER — Encounter: Payer: Self-pay | Admitting: Nurse Practitioner

## 2014-06-06 VITALS — BP 157/92 | HR 97 | Temp 98.0°F

## 2014-06-06 DIAGNOSIS — Z87440 Personal history of urinary (tract) infections: Secondary | ICD-10-CM

## 2014-06-06 DIAGNOSIS — I10 Essential (primary) hypertension: Secondary | ICD-10-CM

## 2014-06-06 DIAGNOSIS — N3000 Acute cystitis without hematuria: Secondary | ICD-10-CM | POA: Diagnosis not present

## 2014-06-06 LAB — POCT UA - MICROSCOPIC ONLY
Casts, Ur, LPF, POC: NEGATIVE
Crystals, Ur, HPF, POC: NEGATIVE
Yeast, UA: NEGATIVE

## 2014-06-06 LAB — POCT URINALYSIS DIPSTICK
Bilirubin, UA: NEGATIVE
Glucose, UA: NEGATIVE
KETONES UA: NEGATIVE
Nitrite, UA: POSITIVE
Protein, UA: NEGATIVE
Urobilinogen, UA: NEGATIVE
pH, UA: 7.5

## 2014-06-06 MED ORDER — VALSARTAN-HYDROCHLOROTHIAZIDE 160-25 MG PO TABS: 1.0000 | ORAL_TABLET | Freq: Every day | ORAL | Status: DC

## 2014-06-06 MED ORDER — DOXYCYCLINE HYCLATE 100 MG PO TABS: 100.0000 mg | ORAL_TABLET | Freq: Two times a day (BID) | ORAL | Status: DC

## 2014-06-06 NOTE — Patient Instructions (Signed)

## 2014-06-06 NOTE — Progress Notes (Signed)
   Subjective:    Patient ID: Dustin Medina, male    DOB: 1950-03-01, 64 y.o.   MRN: [id number]  HPI patient in today for blood pressure recheck. He was seen for UTI 02/14/14 and blood pressure was up slightly elevated. He is feeling fine today without complaints.     Review of Systems  Constitutional: Negative.   HENT: Negative.   Respiratory: Negative.  Negative for shortness of breath.   Cardiovascular: Negative.  Negative for chest pain, palpitations and leg swelling.  Genitourinary: Negative.  Negative for dysuria, urgency and frequency.  Neurological: Negative.  Negative for headaches.  Psychiatric/Behavioral: Negative.   All other systems reviewed and are negative.      Objective:   Physical Exam  Constitutional: He is oriented to person, place, and time. He appears well-developed and well-nourished. No distress.  Cardiovascular: Normal rate, regular rhythm and normal heart sounds.   Pulmonary/Chest: Effort normal and breath sounds normal.  Abdominal: Soft. Bowel sounds are normal. He exhibits no distension and no mass. There is no tenderness. There is no rebound and no guarding.  Genitourinary:  No CVA tenderness  Musculoskeletal:  confined to wheel chair- lower ext paralysis  Neurological: He is alert and oriented to person, place, and time.  Skin: Skin is warm.  Psychiatric: He has a normal mood and affect. His behavior is normal. Judgment and thought content normal.   BP 157/92 mmHg  Pulse 97  Temp(Src) 98 F (36.7 C) (Oral)  Ht   Wt            Assessment & Plan:  1. Essential hypertension Do not add salt to diet No OTC decongestants - valsartan-hydrochlorothiazide (DIOVAN HCT) 160-25 MG per tablet; Take 1 tablet by mouth daily.  Dispense: 30 tablet; Refill: 5  2. History of recurrent UTIs - POCT UA - Microscopic Only - POCT urinalysis dipstick  3. Acute cystitis without hematuria Take medication as prescribe Cotton underwear Take shower not  bath Cranberry juice, yogurt Force fluids AZO over the counter X2 days Culture pending RTO prn - Urine culture - doxycycline (VIBRA-TABS) 100 MG tablet; Take 1 tablet (100 mg total) by mouth 2 (two) times daily. 1 po bid  Dispense: 20 tablet; Refill: 0  [name] Hassell Done, FNP

## 2014-06-08 ENCOUNTER — Other Ambulatory Visit: Payer: Self-pay | Admitting: Nurse Practitioner

## 2014-06-08 ENCOUNTER — Telehealth: Payer: Self-pay

## 2014-06-08 LAB — URINE CULTURE

## 2014-06-08 MED ORDER — NITROFURANTOIN MONOHYD MACRO 100 MG PO CAPS: 100.0000 mg | ORAL_CAPSULE | Freq: Two times a day (BID) | ORAL | Status: DC

## 2014-06-08 NOTE — Telephone Encounter (Signed)
LMOM to return call to let patient know he needs a different antibiotic for his UTI and it has been called in to his pharmacy

## 2014-06-08 NOTE — Telephone Encounter (Signed)
-----   Message from Chevis Pretty, [place] sent at 06/08/2014  2:48 PM EDT ----- uti resistant to doxy- rx sent in for macrobid

## 2014-06-09 ENCOUNTER — Telehealth: Payer: Self-pay | Admitting: Nurse Practitioner

## 2014-06-09 NOTE — Progress Notes (Signed)
lmtcb

## 2014-06-12 ENCOUNTER — Other Ambulatory Visit: Payer: Self-pay | Admitting: *Deleted

## 2014-06-12 ENCOUNTER — Telehealth: Payer: Self-pay | Admitting: Nurse Practitioner

## 2014-06-12 DIAGNOSIS — N390 Urinary tract infection, site not specified: Secondary | ICD-10-CM

## 2014-06-12 NOTE — Telephone Encounter (Signed)
VM full

## 2014-06-15 NOTE — Telephone Encounter (Signed)
Referral order placed.

## 2014-06-19 DIAGNOSIS — N319 Neuromuscular dysfunction of bladder, unspecified: Secondary | ICD-10-CM | POA: Diagnosis not present

## 2014-06-19 DIAGNOSIS — R972 Elevated prostate specific antigen [PSA]: Secondary | ICD-10-CM | POA: Diagnosis not present

## 2014-06-26 ENCOUNTER — Other Ambulatory Visit: Payer: Self-pay | Admitting: Nurse Practitioner

## 2014-06-27 NOTE — Telephone Encounter (Signed)
No refills on antibiotics Needs labs in order to get other refills

## 2014-06-27 NOTE — Telephone Encounter (Signed)
Last seen 06/06/14 MMM  Last lipid 10/10/13

## 2014-06-30 DIAGNOSIS — N3941 Urge incontinence: Secondary | ICD-10-CM | POA: Diagnosis not present

## 2014-06-30 DIAGNOSIS — R339 Retention of urine, unspecified: Secondary | ICD-10-CM | POA: Diagnosis not present

## 2014-06-30 DIAGNOSIS — S71009A Unspecified open wound, unspecified hip, initial encounter: Secondary | ICD-10-CM | POA: Diagnosis not present

## 2014-06-30 DIAGNOSIS — N319 Neuromuscular dysfunction of bladder, unspecified: Secondary | ICD-10-CM | POA: Diagnosis not present

## 2014-07-01 ENCOUNTER — Other Ambulatory Visit: Payer: Self-pay | Admitting: Family Medicine

## 2014-07-03 NOTE — Telephone Encounter (Signed)
Last seen 06/06/14 MMM Last lipid 10/10/13

## 2014-07-03 NOTE — Telephone Encounter (Signed)
no more refills without being seen  

## 2014-07-06 ENCOUNTER — Telehealth: Payer: Self-pay | Admitting: Nurse Practitioner

## 2014-07-06 DIAGNOSIS — R300 Dysuria: Secondary | ICD-10-CM

## 2014-07-06 MED ORDER — NITROFURANTOIN MONOHYD MACRO 100 MG PO CAPS: 100.0000 mg | ORAL_CAPSULE | Freq: Two times a day (BID) | ORAL | Status: DC

## 2014-07-06 NOTE — Telephone Encounter (Signed)
Please review and advise.

## 2014-07-10 DIAGNOSIS — G8220 Paraplegia, unspecified: Secondary | ICD-10-CM | POA: Diagnosis not present

## 2014-07-18 ENCOUNTER — Telehealth: Payer: Self-pay | Admitting: Nurse Practitioner

## 2014-07-18 NOTE — Telephone Encounter (Signed)
As long as there is no problems - mmm says no  Pt aware

## 2014-07-21 ENCOUNTER — Ambulatory Visit (INDEPENDENT_AMBULATORY_CARE_PROVIDER_SITE_OTHER): Payer: Commercial Managed Care - HMO | Admitting: Physician Assistant

## 2014-07-21 ENCOUNTER — Encounter: Payer: Self-pay | Admitting: Physician Assistant

## 2014-07-21 ENCOUNTER — Other Ambulatory Visit: Payer: Self-pay | Admitting: Nurse Practitioner

## 2014-07-21 VITALS — BP 152/88 | HR 85 | Temp 98.4°F

## 2014-07-21 DIAGNOSIS — N3090 Cystitis, unspecified without hematuria: Secondary | ICD-10-CM | POA: Diagnosis not present

## 2014-07-21 DIAGNOSIS — R300 Dysuria: Secondary | ICD-10-CM

## 2014-07-21 LAB — POCT UA - MICROSCOPIC ONLY
CASTS, UR, LPF, POC: NEGATIVE
Crystals, Ur, HPF, POC: NEGATIVE
Epithelial cells, urine per micros: NEGATIVE
MUCUS UA: NEGATIVE
RBC, URINE, MICROSCOPIC: NEGATIVE
Yeast, UA: NEGATIVE

## 2014-07-21 LAB — POCT URINALYSIS DIPSTICK
BILIRUBIN UA: NEGATIVE
Blood, UA: NEGATIVE
GLUCOSE UA: NEGATIVE
Ketones, UA: NEGATIVE
Nitrite, UA: NEGATIVE
PH UA: 8
PROTEIN UA: NEGATIVE
Spec Grav, UA: 1.01
Urobilinogen, UA: NEGATIVE

## 2014-07-21 MED ORDER — NITROFURANTOIN MONOHYD MACRO 100 MG PO CAPS: 100.0000 mg | ORAL_CAPSULE | Freq: Two times a day (BID) | ORAL | Status: DC

## 2014-07-21 NOTE — Progress Notes (Signed)
   Subjective:    Patient ID: Dustin Medina, male    DOB: [birth year], 64 y.o.   MRN: [id number]  HPI 64 y/o paraplegic male presents with c/o pain , foul odor with urination. He has chronic uti's and     Review of Systems  Genitourinary: Positive for dysuria and frequency. Negative for hematuria.       Foul odor with urination   All other systems reviewed and are negative.      Objective:   Physical Exam  Constitutional: He is oriented to person, place, and time. He appears well-developed and well-nourished.  Neurological: He is alert and oriented to person, place, and time.  Psychiatric: He has a normal mood and affect. His behavior is normal. Judgment and thought content normal.  Nursing note and vitals reviewed.  Results for orders placed or performed in visit on 07/21/14  POCT urinalysis dipstick  Result Value Ref Range   Color, UA yellow    Clarity, UA cloudy    Glucose, UA neg    Bilirubin, UA neg    Ketones, UA neg    Spec Grav, UA 1.010    Blood, UA neg    pH, UA 8.0    Protein, UA neg    Urobilinogen, UA negative    Nitrite, UA neg    Leukocytes, UA moderate (2+)   POCT UA - Microscopic Only  Result Value Ref Range   WBC, Ur, HPF, POC occ    RBC, urine, microscopic neg    Bacteria, U Microscopic occ    Mucus, UA neg    Epithelial cells, urine per micros neg    Crystals, Ur, HPF, POC neg    Casts, Ur, LPF, POC neg    Yeast, UA neg           Assessment & Plan:  .1. Dysuria - POCT urinalysis dipstick - POCT UA - Microscopic Only - Urine culture - Urine culture - nitrofurantoin, macrocrystal-monohydrate, (MACROBID) 100 MG capsule; Take 1 capsule (100 mg total) by mouth 2 (two) times daily.  Dispense: 14 capsule; Refill: 0  2. Cystitis  - Urine culture - nitrofurantoin, macrocrystal-monohydrate, (MACROBID) 100 MG capsule; Take 1 capsule (100 mg total) by mouth 2 (two) times daily.  Dispense: 14 capsule; Refill: 0  - Advised patient to f/u with his  urologist for further assessment and prophylactic treatment    Continue all meds Labs pending Health Maintenance reviewed Diet and exercise encouraged   Nalu Troublefield A. Benjamin Stain PA-C

## 2014-07-24 LAB — URINE CULTURE

## 2014-07-24 NOTE — Telephone Encounter (Signed)
Last seen 07/21/14 Dustin Medina  Last lipid 10/10/13

## 2014-07-25 DIAGNOSIS — N3020 Other chronic cystitis without hematuria: Secondary | ICD-10-CM | POA: Diagnosis not present

## 2014-07-25 DIAGNOSIS — N3941 Urge incontinence: Secondary | ICD-10-CM | POA: Diagnosis not present

## 2014-07-25 DIAGNOSIS — N390 Urinary tract infection, site not specified: Secondary | ICD-10-CM | POA: Diagnosis not present

## 2014-07-27 ENCOUNTER — Other Ambulatory Visit: Payer: Self-pay | Admitting: Nurse Practitioner

## 2014-07-29 ENCOUNTER — Other Ambulatory Visit: Payer: Self-pay | Admitting: Nurse Practitioner

## 2014-07-29 NOTE — Telephone Encounter (Signed)
Last seen 07/21/14 [name]  Last lipid 10/10/13

## 2014-08-01 DIAGNOSIS — N3941 Urge incontinence: Secondary | ICD-10-CM | POA: Diagnosis not present

## 2014-08-01 DIAGNOSIS — S71009A Unspecified open wound, unspecified hip, initial encounter: Secondary | ICD-10-CM | POA: Diagnosis not present

## 2014-08-01 DIAGNOSIS — N319 Neuromuscular dysfunction of bladder, unspecified: Secondary | ICD-10-CM | POA: Diagnosis not present

## 2014-08-01 DIAGNOSIS — R339 Retention of urine, unspecified: Secondary | ICD-10-CM | POA: Diagnosis not present

## 2014-08-14 ENCOUNTER — Ambulatory Visit (INDEPENDENT_AMBULATORY_CARE_PROVIDER_SITE_OTHER): Payer: Commercial Managed Care - HMO | Admitting: Physician Assistant

## 2014-08-14 ENCOUNTER — Encounter: Payer: Self-pay | Admitting: Physician Assistant

## 2014-08-14 ENCOUNTER — Other Ambulatory Visit: Payer: Self-pay | Admitting: Nurse Practitioner

## 2014-08-14 VITALS — BP 138/64 | HR 101 | Temp 97.5°F

## 2014-08-14 DIAGNOSIS — N3020 Other chronic cystitis without hematuria: Secondary | ICD-10-CM

## 2014-08-14 DIAGNOSIS — R300 Dysuria: Secondary | ICD-10-CM | POA: Diagnosis not present

## 2014-08-14 DIAGNOSIS — N390 Urinary tract infection, site not specified: Secondary | ICD-10-CM | POA: Diagnosis not present

## 2014-08-14 LAB — POCT URINALYSIS DIPSTICK
BILIRUBIN UA: NEGATIVE
GLUCOSE UA: NEGATIVE
KETONES UA: NEGATIVE
NITRITE UA: POSITIVE
PH UA: 7.5
Spec Grav, UA: 1.01
Urobilinogen, UA: NEGATIVE

## 2014-08-14 LAB — POCT UA - MICROSCOPIC ONLY
Casts, Ur, LPF, POC: NEGATIVE
Crystals, Ur, HPF, POC: NEGATIVE
YEAST UA: NEGATIVE

## 2014-08-14 MED ORDER — NITROFURANTOIN MONOHYD MACRO 100 MG PO CAPS: 100.0000 mg | ORAL_CAPSULE | Freq: Two times a day (BID) | ORAL | Status: DC

## 2014-08-14 NOTE — Progress Notes (Signed)
   Subjective:    Patient ID: Dustin Medina, male    DOB: 02/27/1950, 64 y.o.   MRN: [id number]  HPI 64 y/o paraplegic male presents with c/o foul odor of urine. He has frequent uti's and was seen at [facility] a few weeks ago. His past cultures are resistant to many antibiotics.     Review of Systems  Gastrointestinal: Negative for abdominal pain.  Endocrine: Negative for polyuria.  Genitourinary: Positive for frequency. Negative for dysuria, hematuria and difficulty urinating.       Foul odor of urine        Objective:   Physical Exam  Results for orders placed or performed in visit on 08/14/14  Urine culture  Result Value Ref Range   Urine Culture, Routine Final report (A)    Result 1 Comment (A)    RESULT 2 Comment (A)    ANTIMICROBIAL SUSCEPTIBILITY Comment   POCT UA - Microscopic Only  Result Value Ref Range   WBC, Ur, HPF, POC 30-40    RBC, urine, microscopic 5-10    Bacteria, U Microscopic mod    Mucus, UA occ    Epithelial cells, urine per micros occ    Crystals, Ur, HPF, POC neg    Casts, Ur, LPF, POC neg    Yeast, UA neg   POCT urinalysis dipstick  Result Value Ref Range   Color, UA gold    Clarity, UA cloudy    Glucose, UA neg    Bilirubin, UA neg    Ketones, UA neg    Spec Grav, UA 1.010    Blood, UA mod    pH, UA 7.5    Protein, UA 2++    Urobilinogen, UA negative    Nitrite, UA pos    Leukocytes, UA large (3+) (A) Negative         Assessment & Plan:  1. Dysuria  - POCT UA - Microscopic Only - POCT urinalysis dipstick - Urine culture - nitrofurantoin, macrocrystal-monohydrate, (MACROBID) 100 MG capsule; Take 1 capsule (100 mg total) by mouth 2 (two) times daily.  Dispense: 20 capsule; Refill: 0  2. Chronic cystitis - I have advised him to follow up with his Urologist for preventative treatment and to discuss ongoing antibiotic  - nitrofurantoin, macrocrystal-monohydrate, (MACROBID) 100 MG capsule; Take 1 capsule (100 mg total) by  mouth 2 (two) times daily.  Dispense: 20 capsule; Refill: 0  3. Recurrent urinary tract infection  - nitrofurantoin, macrocrystal-monohydrate, (MACROBID) 100 MG capsule; Take 1 capsule (100 mg total) by mouth 2 (two) times daily.  Dispense: 20 }  Sigourney Portillo A. Benjamin Stain PA-C

## 2014-08-15 DIAGNOSIS — N453 Epididymo-orchitis: Secondary | ICD-10-CM | POA: Diagnosis not present

## 2014-08-17 DIAGNOSIS — R739 Hyperglycemia, unspecified: Secondary | ICD-10-CM | POA: Diagnosis not present

## 2014-08-17 DIAGNOSIS — E785 Hyperlipidemia, unspecified: Secondary | ICD-10-CM | POA: Diagnosis not present

## 2014-08-17 DIAGNOSIS — Z79899 Other long term (current) drug therapy: Secondary | ICD-10-CM | POA: Diagnosis not present

## 2014-08-18 LAB — URINE CULTURE

## 2014-08-22 ENCOUNTER — Other Ambulatory Visit: Payer: Self-pay | Admitting: Physician Assistant

## 2014-08-22 NOTE — Telephone Encounter (Signed)
Last seen 08/14/14 [name]  Last lipid 10/10/13

## 2014-08-28 ENCOUNTER — Other Ambulatory Visit: Payer: Self-pay | Admitting: Physician Assistant

## 2014-08-30 ENCOUNTER — Other Ambulatory Visit: Payer: Self-pay | Admitting: Physician Assistant

## 2014-08-30 DIAGNOSIS — S71009A Unspecified open wound, unspecified hip, initial encounter: Secondary | ICD-10-CM | POA: Diagnosis not present

## 2014-08-30 DIAGNOSIS — R339 Retention of urine, unspecified: Secondary | ICD-10-CM | POA: Diagnosis not present

## 2014-08-30 DIAGNOSIS — N319 Neuromuscular dysfunction of bladder, unspecified: Secondary | ICD-10-CM | POA: Diagnosis not present

## 2014-08-30 DIAGNOSIS — N3941 Urge incontinence: Secondary | ICD-10-CM | POA: Diagnosis not present

## 2014-08-30 NOTE — Telephone Encounter (Signed)
Last seen 08/14/14 Dustin Medina  Last lipid 10/10/13

## 2014-09-04 DIAGNOSIS — G8220 Paraplegia, unspecified: Secondary | ICD-10-CM | POA: Diagnosis not present

## 2014-09-04 DIAGNOSIS — S24103A Unspecified injury at T7-T10 level of thoracic spinal cord, initial encounter: Secondary | ICD-10-CM | POA: Diagnosis not present

## 2014-09-19 ENCOUNTER — Other Ambulatory Visit: Payer: Self-pay | Admitting: Physician Assistant

## 2014-09-26 ENCOUNTER — Other Ambulatory Visit: Payer: Self-pay | Admitting: Physician Assistant

## 2014-09-26 NOTE — Telephone Encounter (Signed)
zocor denied- Patient NTBS for follow up and lab work

## 2014-09-26 NOTE — Telephone Encounter (Signed)
Appt has been made for [facility] 8/17

## 2014-09-27 ENCOUNTER — Other Ambulatory Visit: Payer: Self-pay | Admitting: Physical Medicine & Rehabilitation

## 2014-09-28 DIAGNOSIS — N319 Neuromuscular dysfunction of bladder, unspecified: Secondary | ICD-10-CM | POA: Diagnosis not present

## 2014-09-28 DIAGNOSIS — R339 Retention of urine, unspecified: Secondary | ICD-10-CM | POA: Diagnosis not present

## 2014-09-28 DIAGNOSIS — N3941 Urge incontinence: Secondary | ICD-10-CM | POA: Diagnosis not present

## 2014-09-28 DIAGNOSIS — S71009A Unspecified open wound, unspecified hip, initial encounter: Secondary | ICD-10-CM | POA: Diagnosis not present

## 2014-10-04 ENCOUNTER — Encounter (INDEPENDENT_AMBULATORY_CARE_PROVIDER_SITE_OTHER): Payer: Self-pay

## 2014-10-04 ENCOUNTER — Ambulatory Visit (INDEPENDENT_AMBULATORY_CARE_PROVIDER_SITE_OTHER): Payer: Commercial Managed Care - HMO | Admitting: Nurse Practitioner

## 2014-10-04 ENCOUNTER — Encounter: Payer: Self-pay | Admitting: Nurse Practitioner

## 2014-10-04 VITALS — BP 125/80 | HR 81 | Temp 97.0°F

## 2014-10-04 DIAGNOSIS — S24104S Unspecified injury at T11-T12 level of thoracic spinal cord, sequela: Secondary | ICD-10-CM

## 2014-10-04 DIAGNOSIS — N3000 Acute cystitis without hematuria: Secondary | ICD-10-CM

## 2014-10-04 DIAGNOSIS — N319 Neuromuscular dysfunction of bladder, unspecified: Secondary | ICD-10-CM

## 2014-10-04 DIAGNOSIS — R339 Retention of urine, unspecified: Secondary | ICD-10-CM

## 2014-10-04 DIAGNOSIS — F17200 Nicotine dependence, unspecified, uncomplicated: Secondary | ICD-10-CM

## 2014-10-04 DIAGNOSIS — K2100 Gastro-esophageal reflux disease with esophagitis, without bleeding: Secondary | ICD-10-CM

## 2014-10-04 DIAGNOSIS — E291 Testicular hypofunction: Secondary | ICD-10-CM

## 2014-10-04 DIAGNOSIS — L89102 Pressure ulcer of unspecified part of back, stage 2: Secondary | ICD-10-CM | POA: Diagnosis not present

## 2014-10-04 DIAGNOSIS — M549 Dorsalgia, unspecified: Secondary | ICD-10-CM

## 2014-10-04 DIAGNOSIS — I10 Essential (primary) hypertension: Secondary | ICD-10-CM

## 2014-10-04 DIAGNOSIS — Z125 Encounter for screening for malignant neoplasm of prostate: Secondary | ICD-10-CM | POA: Diagnosis not present

## 2014-10-04 DIAGNOSIS — E785 Hyperlipidemia, unspecified: Secondary | ICD-10-CM | POA: Diagnosis not present

## 2014-10-04 DIAGNOSIS — G8220 Paraplegia, unspecified: Secondary | ICD-10-CM

## 2014-10-04 DIAGNOSIS — Z720 Tobacco use: Secondary | ICD-10-CM | POA: Diagnosis not present

## 2014-10-04 DIAGNOSIS — N390 Urinary tract infection, site not specified: Secondary | ICD-10-CM

## 2014-10-04 DIAGNOSIS — G8929 Other chronic pain: Secondary | ICD-10-CM

## 2014-10-04 LAB — POCT URINALYSIS DIPSTICK
Bilirubin, UA: NEGATIVE
Blood, UA: NEGATIVE
Glucose, UA: NEGATIVE
KETONES UA: NEGATIVE
LEUKOCYTES UA: NEGATIVE
Nitrite, UA: POSITIVE
PH UA: 7.5
SPEC GRAV UA: 1.015
UROBILINOGEN UA: NEGATIVE

## 2014-10-04 LAB — POCT UA - MICROSCOPIC ONLY
CRYSTALS, UR, HPF, POC: NEGATIVE
Casts, Ur, LPF, POC: NEGATIVE
EPITHELIAL CELLS, URINE PER MICROSCOPY: NEGATIVE
RBC, urine, microscopic: NEGATIVE
YEAST UA: NEGATIVE

## 2014-10-04 MED ORDER — TESTOSTERONE CYPIONATE 200 MG/ML IM SOLN: 150.0000 mg | INTRAMUSCULAR | Status: DC

## 2014-10-04 MED ORDER — NITROFURANTOIN MONOHYD MACRO 100 MG PO CAPS: 100.0000 mg | ORAL_CAPSULE | Freq: Two times a day (BID) | ORAL | Status: DC

## 2014-10-04 MED ORDER — VALSARTAN-HYDROCHLOROTHIAZIDE 160-25 MG PO TABS: 1.0000 | ORAL_TABLET | Freq: Every day | ORAL | Status: DC

## 2014-10-04 MED ORDER — SIMVASTATIN 40 MG PO TABS
ORAL_TABLET | ORAL | Status: DC
Start: 2014-10-04 — End: 2015-03-19

## 2014-10-04 MED ORDER — NIACIN ER (ANTIHYPERLIPIDEMIC) 1000 MG PO TBCR: EXTENDED_RELEASE_TABLET | ORAL | Status: DC

## 2014-10-04 MED ORDER — OMEPRAZOLE 20 MG PO CPDR: DELAYED_RELEASE_CAPSULE | ORAL | Status: DC
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
## 2014-10-04 NOTE — Patient Instructions (Signed)

## 2014-10-05 LAB — CMP14+EGFR
A/G RATIO: 1.5 (ref 1.1–2.5)
ALT: 22 IU/L (ref 0–44)
AST: 22 IU/L (ref 0–40)
Albumin: 4.1 g/dL (ref 3.6–4.8)
Alkaline Phosphatase: 67 IU/L (ref 39–117)
BILIRUBIN TOTAL: 1 mg/dL (ref 0.0–1.2)
BUN/Creatinine Ratio: 21 (ref 10–22)
BUN: 11 mg/dL (ref 8–27)
CALCIUM: 9.6 mg/dL (ref 8.6–10.2)
CHLORIDE: 100 mmol/L (ref 97–108)
CO2: 25 mmol/L (ref 18–29)
Creatinine, Ser: 0.52 mg/dL — ABNORMAL LOW (ref 0.76–1.27)
GFR calc Af Amer: 131 mL/min/{1.73_m2} (ref >59)
GFR calc non Af Amer: 113 mL/min/{1.73_m2} (ref >59)
GLUCOSE: 97 mg/dL (ref 65–99)
Globulin, Total: 2.7 g/dL (ref 1.5–4.5)
POTASSIUM: 4.6 mmol/L (ref 3.5–5.2)
Sodium: 140 mmol/L (ref 134–144)
Total Protein: 6.8 g/dL (ref 6.0–8.5)

## 2014-10-05 LAB — LIPID PANEL
Chol/HDL Ratio: 5.6 ratio units — ABNORMAL HIGH (ref 0.0–5.0)
Cholesterol, Total: 185 mg/dL (ref 100–199)
HDL: 33 mg/dL — AB (ref >39)
LDL Calculated: 98 mg/dL (ref 0–99)
TRIGLYCERIDES: 270 mg/dL — AB (ref 0–149)
VLDL Cholesterol Cal: 54 mg/dL — ABNORMAL HIGH (ref 5–40)

## 2014-10-05 LAB — PSA, TOTAL AND FREE
PSA, Free Pct: 6.1 %
PSA, Free: 0.46 ng/mL
Prostate Specific Ag, Serum: 7.6 ng/mL — ABNORMAL HIGH (ref 0.0–4.0)

## 2014-10-06 ENCOUNTER — Telehealth: Payer: Self-pay | Admitting: Nurse Practitioner

## 2014-10-06 LAB — URINE CULTURE

## 2014-10-10 ENCOUNTER — Encounter: Payer: Self-pay | Admitting: Nurse Practitioner

## 2014-10-10 ENCOUNTER — Ambulatory Visit (INDEPENDENT_AMBULATORY_CARE_PROVIDER_SITE_OTHER): Payer: Commercial Managed Care - HMO | Admitting: Nurse Practitioner

## 2014-10-10 VITALS — BP 154/79 | HR 81 | Temp 97.4°F

## 2014-10-10 DIAGNOSIS — L732 Hidradenitis suppurativa: Secondary | ICD-10-CM | POA: Diagnosis not present

## 2014-10-10 MED ORDER — SULFAMETHOXAZOLE-TRIMETHOPRIM 800-160 MG PO TABS: 1.0000 | ORAL_TABLET | Freq: Two times a day (BID) | ORAL | Status: DC

## 2014-10-10 NOTE — Progress Notes (Signed)
   Subjective:    Patient ID: Dustin Medina, male    DOB: September 18, 1950, 64 y.o.   MRN: [id number]    *Pt here today c/o abscess to bilateral under arms noted on Friday. Has been taking hot showers and using cream at home without much relief. Taking tylenol at home. Reports redness but no drainage. Denies any hx of the same. Denies any contact with new irritating substance.   HPI  Review of Systems  Skin: Positive for color change and wound. Negative for pallor.       Abscessed to bilateral axilla  Hematological: Negative.        Objective:   Physical Exam  Constitutional: He appears well-developed and well-nourished.  Cardiovascular: Normal rate, regular rhythm, normal heart sounds and intact distal pulses.   Pulmonary/Chest: Effort normal and breath sounds normal.  Skin: Skin is warm.  Multiple abscesses noted to bilateral axilla      BP 154/79 mmHg  Pulse 81  Temp(Src) 97.4 F (36.3 C) (Oral)  Ht   Wt      Assessment & Plan:  1. Hidradenitis axillaris Avoid antiperspirants- can use deodorant - sulfamethoxazole-trimethoprim (BACTRIM DS) 800-160 MG per tablet; Take 1 tablet by mouth 2 (two) times daily.  Dispense: 14 tablet; Refill: 0 If does not improve RTO  [name] Hassell Done, FNP

## 2014-10-10 NOTE — Patient Instructions (Signed)
Hidradenitis Suppurativa, Sweat Gland Abscess Hidradenitis suppurativa is a long lasting (chronic), uncommon disease of the sweat glands. With this, boil-like lumps and scarring develop in the groin, some times under the arms (axillae), and under the breasts. It may also uncommonly occur behind the ears, in the crease of the buttocks, and around the genitals.  CAUSES  The cause is from a blocking of the sweat glands. They then become infected. It may cause drainage and odor. It is not contagious. So it cannot be given to someone else. It most often shows up in puberty (about 10 to 63 years of age). But it may happen much later. It is similar to acne which is a disease of the sweat glands. This condition is slightly more common in African-Americans and women. SYMPTOMS   Hidradenitis usually starts as one or more red, tender, swellings in the groin or under the arms (axilla).  Over a period of hours to days the lesions get larger. They often open to the skin surface, draining clear to yellow-colored fluid.  The infected area heals with scarring. DIAGNOSIS  Your caregiver makes this diagnosis by looking at you. Sometimes cultures (growing germs on plates in the lab) may be taken. This is to see what germ (bacterium) is causing the infection.  TREATMENT   Topical germ killing medicine applied to the skin (antibiotics) are the treatment of choice. Antibiotics taken by mouth (systemic) are sometimes needed when the condition is getting worse or is severe.  Avoid tight-fitting clothing which traps moisture in.  Dirt does not cause hidradenitis and it is not caused by poor hygiene.  Involved areas should be cleaned daily using an antibacterial soap. Some patients find that the liquid form of Lever 2000, applied to the involved areas as a lotion after bathing, can help reduce the odor related to this condition.  Sometimes surgery is needed to drain infected areas or remove scarred tissue. Removal of  large amounts of tissue is used only in severe cases.  Birth control pills may be helpful.  Oral retinoids (vitamin A derivatives) for 6 to 12 months which are effective for acne may also help this condition.  Weight loss will improve but not cure hidradenitis. It is made worse by being overweight. But the condition is not caused by being overweight.  This condition is more common in people who have had acne.  It may become worse under stress. There is no medical cure for hidradenitis. It can be controlled, but not cured. The condition usually continues for years with periods of getting worse and getting better (remission). Document Released: 09/18/2003 Document Revised: 04/28/2011 Document Reviewed: 05/06/2013 [employer] Patient Information 2015 [employer]. This information is not intended to replace advice given to you by your health care provider. Make sure you discuss any questions you have with your health care provider.  

## 2014-10-20 DIAGNOSIS — N319 Neuromuscular dysfunction of bladder, unspecified: Secondary | ICD-10-CM | POA: Diagnosis not present

## 2014-10-20 DIAGNOSIS — R972 Elevated prostate specific antigen [PSA]: Secondary | ICD-10-CM | POA: Diagnosis not present

## 2014-10-27 ENCOUNTER — Telehealth: Payer: Self-pay | Admitting: Nurse Practitioner

## 2014-10-27 ENCOUNTER — Other Ambulatory Visit: Payer: Self-pay | Admitting: Nurse Practitioner

## 2014-10-27 MED ORDER — NITROFURANTOIN MONOHYD MACRO 100 MG PO CAPS: 100.0000 mg | ORAL_CAPSULE | Freq: Two times a day (BID) | ORAL | Status: DC

## 2014-10-27 NOTE — Telephone Encounter (Signed)
Please review and advise.

## 2014-10-27 NOTE — Telephone Encounter (Signed)
macrobid rx sent to pharmacy 

## 2014-10-30 ENCOUNTER — Telehealth: Payer: Self-pay | Admitting: Nurse Practitioner

## 2014-10-30 NOTE — Telephone Encounter (Signed)
macrobid was called on Friday- did patient not pick up medication.

## 2014-10-30 NOTE — Telephone Encounter (Signed)
Can come in for rocephin shot and recheck of urine.

## 2014-10-30 NOTE — Telephone Encounter (Signed)
Patient got the macrobid. His symptoms are not improving

## 2014-10-31 NOTE — Telephone Encounter (Signed)
Patient is going to see how he is going to do the next 2 days.

## 2014-11-06 ENCOUNTER — Telehealth: Payer: Self-pay | Admitting: Nurse Practitioner

## 2014-11-06 ENCOUNTER — Ambulatory Visit (INDEPENDENT_AMBULATORY_CARE_PROVIDER_SITE_OTHER): Payer: Commercial Managed Care - HMO | Admitting: Family Medicine

## 2014-11-06 ENCOUNTER — Encounter: Payer: Self-pay | Admitting: Family Medicine

## 2014-11-06 VITALS — BP 133/85 | HR 89 | Temp 97.3°F | Ht 71.0 in

## 2014-11-06 DIAGNOSIS — N319 Neuromuscular dysfunction of bladder, unspecified: Secondary | ICD-10-CM | POA: Diagnosis not present

## 2014-11-06 DIAGNOSIS — G8220 Paraplegia, unspecified: Secondary | ICD-10-CM | POA: Diagnosis not present

## 2014-11-06 DIAGNOSIS — S24104S Unspecified injury at T11-T12 level of thoracic spinal cord, sequela: Secondary | ICD-10-CM | POA: Diagnosis not present

## 2014-11-06 DIAGNOSIS — N390 Urinary tract infection, site not specified: Secondary | ICD-10-CM | POA: Diagnosis not present

## 2014-11-06 DIAGNOSIS — R300 Dysuria: Secondary | ICD-10-CM

## 2014-11-06 LAB — POCT URINALYSIS DIPSTICK
Bilirubin, UA: NEGATIVE
Glucose, UA: NEGATIVE
KETONES UA: NEGATIVE
Nitrite, UA: NEGATIVE
PH UA: 7.5
SPEC GRAV UA: 1.01
Urobilinogen, UA: NEGATIVE

## 2014-11-06 LAB — POCT UA - MICROSCOPIC ONLY
CASTS, UR, LPF, POC: NEGATIVE
Crystals, Ur, HPF, POC: NEGATIVE
Mucus, UA: NEGATIVE
YEAST UA: NEGATIVE

## 2014-11-06 MED ORDER — CIPROFLOXACIN HCL 500 MG PO TABS: 500.0000 mg | ORAL_TABLET | Freq: Two times a day (BID) | ORAL | Status: DC

## 2014-11-06 NOTE — Telephone Encounter (Signed)
His phone connection wasn't clear but I understood him to say that his urine was cloudy and that his shoulder hurt.  I don't know how these are related but patient thinks they may be.  Patient scheduled to come in and see Dr Laurance Flatten this afternoon at 2:45.

## 2014-11-06 NOTE — Patient Instructions (Addendum)
Continue to drink plenty of fluids Take antibiotic as directed for 2 weeks with 1 refill The choice of the antibiotic may change pending results of the urine culture If the urine culture has a significant amount of resistant bugs we will call the urologist but we will wait to call him until the culture is back in the meantime take the antibiotic as directed The last creatinine was not elevated. This was in August.

## 2014-11-06 NOTE — Progress Notes (Signed)
Subjective:    Patient ID: Dustin Medina, male    DOB: 12/15/1950, 64 y.o.   MRN: [id number]  HPI Patient here today for dysuria and flank pain. He has recently been on 3 rounds of antibiotics. He has had some suprapubic pressure and some right groin discomfort along with sometimes some low back pain. He sees Dr. Vikki Ports routinely with [facility]. He is been on 2 rounds of antibiotics and sulfa.       Patient Active Problem List   Diagnosis Date Noted  . De Quervain's tenosynovitis, right 03/03/2014  . Right carpal tunnel syndrome 12/06/2013  . T12 spinal cord injury 12/06/2013  . Neurogenic bladder 12/06/2013  . Neurogenic bowel 12/06/2013  . Chronic back pain 10/10/2013  . Low testosterone 07/06/2013  . Urinary retention 04/07/2013  . Decubitus ulcer of back 03/03/2013  . Tobacco smoker within last 12 months   . Paraplegic spinal paralysis 07/24/2012  . Tachycardia 07/24/2012  . Lipoma of axilla - 15 cm 06/26/2011  . Peptic stricture of esophagus 06/27/2010  . Hyperlipidemia with target LDL less than 100 04/09/2009  . Essential hypertension 04/09/2009  . GERD 04/09/2009   Outpatient Encounter Prescriptions as of 11/06/2014  Medication Sig  . acetaminophen (TYLENOL) 325 MG tablet Take 650 mg by mouth every 6 (six) hours as needed.  . cyclobenzaprine (FLEXERIL) 10 MG tablet TAKE 1 TABLET (10 MG TOTAL) BY MOUTH 3 (THREE) TIMES DAILY AS NEEDED FOR MUSCLE SPASMS.  . meloxicam (MOBIC) 15 MG tablet TAKE 1 TABLET BY MOUTH EVERY DAY AS NEEDED  . Multiple Vitamin (MULITIVITAMIN WITH MINERALS) TABS Take 1 tablet by mouth daily.   . niacin (NIASPAN) 1000 MG CR tablet TAKE 1 TABLET (1,000 MG TOTAL) BY MOUTH AT BEDTIME.  Marland Kitchen omeprazole (PRILOSEC) 20 MG capsule TAKE 1 CAPSULE (20 MG TOTAL) BY MOUTH 2 (TWO) TIMES DAILY BEFORE A MEAL.  . simvastatin (ZOCOR) 40 MG tablet TAKE 1 TABLET (40 MG TOTAL) BY MOUTH AT BEDTIME. NEEDS TO BE SEEN  . testosterone cypionate (DEPO-TESTOSTERONE)  200 MG/ML injection Inject 0.75 mLs (150 mg total) into the muscle every 14 (fourteen) days.  Marland Kitchen trimethoprim (TRIMPEX) 100 MG tablet TAKE 1 TABLET BY MOUTH EVERY DAY  . valsartan-hydrochlorothiazide (DIOVAN HCT) 160-25 MG per tablet Take 1 tablet by mouth daily.  . promethazine (PHENERGAN) 25 MG tablet Take 1 tablet (25 mg total) by mouth every 8 (eight) hours as needed for nausea or vomiting. (Patient not taking: Reported on 11/06/2014)  . [DISCONTINUED] nitrofurantoin, macrocrystal-monohydrate, (MACROBID) 100 MG capsule Take 1 capsule (100 mg total) by mouth 2 (two) times daily. 1 po BId  . [DISCONTINUED] nitrofurantoin, macrocrystal-monohydrate, (MACROBID) 100 MG capsule Take 1 capsule (100 mg total) by mouth 2 (two) times daily. 1 po BId  . [DISCONTINUED] oxyCODONE-acetaminophen (PERCOCET/ROXICET) 5-325 MG per tablet Take 1-2 tablets by mouth every 6 (six) hours as needed for moderate pain or severe pain.  . [DISCONTINUED] sulfamethoxazole-trimethoprim (BACTRIM DS) 800-160 MG per tablet Take 1 tablet by mouth 2 (two) times daily.   No facility-administered encounter medications on file as of 11/06/2014.     Review of Systems  Constitutional: Negative.   HENT: Negative.   Eyes: Negative.   Respiratory: Negative.   Cardiovascular: Negative.   Gastrointestinal: Negative.   Endocrine: Negative.   Genitourinary: Positive for dysuria and flank pain.  Musculoskeletal: Negative.   Skin: Negative.   Allergic/Immunologic: Negative.   Neurological: Negative.   Hematological: Negative.   Psychiatric/Behavioral: Negative.  Objective:   Physical Exam  Constitutional: He is oriented to person, place, and time. He appears well-developed and well-nourished. No distress.  Paraplegic and alert in wheelchair  HENT:  Head: Normocephalic and atraumatic.  Eyes: Conjunctivae and EOM are normal. Pupils are equal, round, and reactive to light. Right eye exhibits no discharge. Left eye exhibits no  discharge. No scleral icterus.  Neck: Normal range of motion. Neck supple. No thyromegaly present.  Cardiovascular: Normal rate, regular rhythm and normal heart sounds.   No murmur heard. At 72/m  Pulmonary/Chest: Effort normal and breath sounds normal. No respiratory distress. He has no wheezes. He has no rales. He exhibits no tenderness.  Clear  anteriorly and posteriorly  Abdominal: Soft. Bowel sounds are normal. He exhibits no mass. There is no tenderness. There is no rebound and no guarding.  Nontender to palpation and without masses.  Genitourinary:  Unable to examine rectum or genitalia due to his paraplegia.  Musculoskeletal: Normal range of motion. He exhibits no edema or tenderness.  Lymphadenopathy:    He has no cervical adenopathy.  Neurological: He is alert and oriented to person, place, and time.  Skin: Skin is warm and dry. No rash noted.  Psychiatric: He has a normal mood and affect. His behavior is normal. Judgment and thought content normal.  Nursing note and vitals reviewed.  BP 133/85 mmHg  Pulse 89  Temp(Src) 97.3 F (36.3 C) (Oral)  Ht 5\' 11"  (1.803 m)  Wt   Results for orders placed or performed in visit on 11/06/14  POCT urinalysis dipstick  Result Value Ref Range   Color, UA gold    Clarity, UA cloudy    Glucose, UA neg    Bilirubin, UA neg    Ketones, UA neg    Spec Grav, UA 1.010    Blood, UA mod    pH, UA 7.5    Protein, UA 1+    Urobilinogen, UA negative    Nitrite, UA neg    Leukocytes, UA large (3+) (A) Negative  POCT UA - Microscopic Only  Result Value Ref Range   WBC, Ur, HPF, POC tntc    RBC, urine, microscopic 10-20    Bacteria, U Microscopic many    Mucus, UA neg    Epithelial cells, urine per micros few    Crystals, Ur, HPF, POC neg    Casts, Ur, LPF, POC neg    Yeast, UA neg          Assessment & Plan:  1. Dysuria -Because of the persistent urinary tract infections we will discuss this with his urologist once the urine  culture has been return. - POCT urinalysis dipstick - POCT UA - Microscopic Only - Urine culture - ciprofloxacin (CIPRO) 500 MG tablet; Take 1 tablet (500 mg total) by mouth 2 (two) times daily.  Dispense: 28 tablet; Refill: 1  2. Paraplegic spinal paralysis -Continue follow-up   3. T12 spinal cord injury, sequela  4. Neurogenic bladder -Continue to follow-up with urology  5. Recurrent UTI -Take antibiotic as directed and wait for urine culture to be returned - ciprofloxacin (CIPRO) 500 MG tablet; Take 1 tablet (500 mg total) by mouth 2 (two) times daily.  Dispense: 28 tablet; Refill: 1  Patient Instructions  Continue to drink plenty of fluids Take antibiotic as directed for 2 weeks with 1 refill The choice of the antibiotic may change pending results of the urine culture If the urine culture has a significant amount of resistant bugs  we will call the urologist but we will wait to call him until the culture is back in the meantime take the antibiotic as directed The last urine creatinine was not elevated. This was in August.   Arrie Senate MD

## 2014-11-08 LAB — URINE CULTURE

## 2014-11-09 ENCOUNTER — Other Ambulatory Visit: Payer: Self-pay | Admitting: *Deleted

## 2014-11-09 MED ORDER — NITROFURANTOIN MACROCRYSTAL 100 MG PO CAPS: 100.0000 mg | ORAL_CAPSULE | Freq: Two times a day (BID) | ORAL | Status: DC

## 2014-11-17 ENCOUNTER — Other Ambulatory Visit (INDEPENDENT_AMBULATORY_CARE_PROVIDER_SITE_OTHER): Payer: Commercial Managed Care - HMO

## 2014-11-17 ENCOUNTER — Telehealth: Payer: Self-pay | Admitting: Nurse Practitioner

## 2014-11-17 DIAGNOSIS — R399 Unspecified symptoms and signs involving the genitourinary system: Secondary | ICD-10-CM | POA: Diagnosis not present

## 2014-11-17 LAB — POCT URINALYSIS DIPSTICK
Bilirubin, UA: NEGATIVE
Blood, UA: NEGATIVE
Glucose, UA: NEGATIVE
KETONES UA: NEGATIVE
Nitrite, UA: NEGATIVE
PH UA: 6
PROTEIN UA: NEGATIVE
SPEC GRAV UA: 1.01
UROBILINOGEN UA: NEGATIVE

## 2014-11-17 LAB — POCT UA - MICROSCOPIC ONLY
Bacteria, U Microscopic: NEGATIVE
CRYSTALS, UR, HPF, POC: NEGATIVE
Casts, Ur, LPF, POC: NEGATIVE
Mucus, UA: NEGATIVE
RBC, urine, microscopic: NEGATIVE
YEAST UA: NEGATIVE

## 2014-11-17 NOTE — Telephone Encounter (Signed)
Left message:  Patient should have finished antibiotic, macrodantin, by now.   Per provider suggestions , another urine specimen should be checked.  If possible patient should bring another specimen or try to be seen again.  Addendum:  Spoke with patient and he is coming by to leave a urine specimen.

## 2014-11-17 NOTE — Progress Notes (Signed)
Lab only 

## 2014-11-18 ENCOUNTER — Ambulatory Visit (INDEPENDENT_AMBULATORY_CARE_PROVIDER_SITE_OTHER): Payer: Commercial Managed Care - HMO | Admitting: Physician Assistant

## 2014-11-18 ENCOUNTER — Encounter: Payer: Self-pay | Admitting: Physician Assistant

## 2014-11-18 VITALS — BP 153/85 | HR 93 | Temp 99.4°F

## 2014-11-18 DIAGNOSIS — T83511A Infection and inflammatory reaction due to indwelling urethral catheter, initial encounter: Secondary | ICD-10-CM

## 2014-11-18 DIAGNOSIS — N3080 Other cystitis without hematuria: Secondary | ICD-10-CM | POA: Diagnosis not present

## 2014-11-18 DIAGNOSIS — N3090 Cystitis, unspecified without hematuria: Secondary | ICD-10-CM

## 2014-11-18 DIAGNOSIS — N390 Urinary tract infection, site not specified: Secondary | ICD-10-CM

## 2014-11-18 LAB — URINE CULTURE: Organism ID, Bacteria: NO GROWTH

## 2014-11-18 MED ORDER — CEFTRIAXONE SODIUM 1 G IJ SOLR
1.0000 g | Freq: Once | INTRAMUSCULAR | Status: AC
  Administered 2014-11-18: 1 g via INTRAMUSCULAR

## 2014-11-18 MED ORDER — CEFTRIAXONE SODIUM 1 G IJ SOLR: 1.0000 g | Freq: Once | INTRAMUSCULAR | Status: DC

## 2014-11-18 NOTE — Progress Notes (Signed)
Patient ID: Dustin Medina, male   DOB: Jul 15, 1950, 64 y.o.   MRN: [id number]   64 y/o paraplegic male prssents for recurrent UTI. He was seen by Dr. Laurance Flatten last week. Urine culture indicated resistance to numerous antibiotics. Treatment was discussed yesterday with Dr. Wendi Snipes and we have decided to treat patient with Rocephin 1g IM today. He is also following up with his urologist within the next two weeks for a cystoscopy.   1. Recurrent cystitis  - cefTRIAXone (ROCEPHIN) injection 1 g; Inject 1 g into the muscle once.  2. Urinary tract infection associated with catheterization of urinary tract, initial encounter  Keep appt with urology as planned     Hazael Olveda A. Benjamin Stain PA-C

## 2014-11-22 ENCOUNTER — Encounter: Payer: Self-pay | Admitting: *Deleted

## 2014-12-05 ENCOUNTER — Encounter: Payer: Self-pay | Admitting: Nurse Practitioner

## 2014-12-15 ENCOUNTER — Other Ambulatory Visit: Payer: Self-pay | Admitting: Physician Assistant

## 2014-12-16 ENCOUNTER — Ambulatory Visit (INDEPENDENT_AMBULATORY_CARE_PROVIDER_SITE_OTHER): Payer: Commercial Managed Care - HMO | Admitting: Nurse Practitioner

## 2014-12-16 VITALS — BP 165/85 | HR 79 | Temp 97.0°F

## 2014-12-16 DIAGNOSIS — T83511D Infection and inflammatory reaction due to indwelling urethral catheter, subsequent encounter: Secondary | ICD-10-CM

## 2014-12-16 DIAGNOSIS — R5383 Other fatigue: Secondary | ICD-10-CM | POA: Diagnosis not present

## 2014-12-16 DIAGNOSIS — N390 Urinary tract infection, site not specified: Secondary | ICD-10-CM | POA: Diagnosis not present

## 2014-12-16 LAB — POCT URINALYSIS DIPSTICK
Bilirubin, UA: NEGATIVE
GLUCOSE UA: NEGATIVE
Nitrite, UA: NEGATIVE
Protein, UA: NEGATIVE
SPEC GRAV UA: 1.015
UROBILINOGEN UA: 0.2
pH, UA: 7.5

## 2014-12-16 LAB — POCT UA - MICROSCOPIC ONLY: CRYSTALS, UR, HPF, POC: NEGATIVE

## 2014-12-16 MED ORDER — SULFAMETHOXAZOLE-TRIMETHOPRIM 800-160 MG PO TABS: 1.0000 | ORAL_TABLET | Freq: Two times a day (BID) | ORAL | Status: DC

## 2014-12-16 NOTE — Patient Instructions (Signed)

## 2014-12-16 NOTE — Progress Notes (Signed)
   Subjective:    Patient ID: DESSIE DELCARLO, male    DOB: 08-01-1950, 64 y.o.   MRN: [id number]  HPI Patient in c/o of feeling so weak that he doesn't feel like doing anything. Sleeping a lot. Started about 1 week ago. He gets like this when he has a UTI- would like to have urine checked.    Review of Systems  Constitutional: Negative.   HENT: Negative.   Respiratory: Negative.   Cardiovascular: Negative.   Genitourinary: Negative.   Neurological: Negative.   Psychiatric/Behavioral: Negative.   All other systems reviewed and are negative.      Objective:   Physical Exam  Constitutional: He is oriented to person, place, and time. He appears well-developed and well-nourished.  Cardiovascular: Normal rate and normal heart sounds.   Neurological: He is alert and oriented to person, place, and time.  Skin: Skin is warm.  Psychiatric: He has a normal mood and affect. His behavior is normal. Judgment and thought content normal.    /\BP 165/85 mmHg  Pulse 79  Temp(Src) 97 F (36.1 C) (Oral)  Ht   Wt        Assessment & Plan:  1. Other fatigue - POCT UA - Microscopic Only - POCT urinalysis dipstick  2. Urinary tract infection associated with catheterization of urinary tract, subsequent encounter Force fluids Culture pending - Urine culture - sulfamethoxazole-trimethoprim (BACTRIM DS) 800-160 MG tablet; Take 1 tablet by mouth 2 (two) times daily.  Dispense: 14 tablet; Refill: 0  [name] Hassell Done, FNP

## 2014-12-18 LAB — URINE CULTURE: Organism ID, Bacteria: NO GROWTH

## 2015-01-08 ENCOUNTER — Ambulatory Visit (INDEPENDENT_AMBULATORY_CARE_PROVIDER_SITE_OTHER): Payer: Commercial Managed Care - HMO | Admitting: Nurse Practitioner

## 2015-01-08 ENCOUNTER — Encounter: Payer: Self-pay | Admitting: Nurse Practitioner

## 2015-01-08 VITALS — BP 149/86 | HR 88 | Temp 97.4°F

## 2015-01-08 DIAGNOSIS — I10 Essential (primary) hypertension: Secondary | ICD-10-CM | POA: Diagnosis not present

## 2015-01-08 DIAGNOSIS — R339 Retention of urine, unspecified: Secondary | ICD-10-CM | POA: Diagnosis not present

## 2015-01-08 DIAGNOSIS — L989 Disorder of the skin and subcutaneous tissue, unspecified: Secondary | ICD-10-CM

## 2015-01-08 DIAGNOSIS — S24104S Unspecified injury at T11-T12 level of thoracic spinal cord, sequela: Secondary | ICD-10-CM

## 2015-01-08 DIAGNOSIS — E785 Hyperlipidemia, unspecified: Secondary | ICD-10-CM

## 2015-01-08 DIAGNOSIS — K2100 Gastro-esophageal reflux disease with esophagitis, without bleeding: Secondary | ICD-10-CM

## 2015-01-08 DIAGNOSIS — G8220 Paraplegia, unspecified: Secondary | ICD-10-CM
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
## 2015-01-08 NOTE — Patient Instructions (Signed)
Pressure Injury °A pressure injury, sometimes called a bedsore, is an injury to the skin and underlying tissue caused by pressure. Pressure on blood vessels causes decreased blood flow to the skin, which can eventually cause the skin tissue to die and break down into a wound. °Pressure injuries usually occur: °· Over bony parts of the body such as the tailbone, shoulders, elbows, hips, and heels. °· Under medical devices such as respiratory equipment, stockings, tubes, and splints. °Pressure injuries start as reddened areas on the skin and can lead to pain, muscle damage, and infection. Pressure injuries can vary in severity.  °CAUSES °Pressure injuries are caused by a lack of blood supply to an area of skin. They can occur from intense pressure over a short period of time or from less intense pressure over a long period of time. °RISK FACTORS °This condition is more likely to develop in people who: °· Are in the hospital or an extended care facility. °· Are bedridden or in a wheelchair. °· Have an injury or disease that keeps them from: °¨ Moving normally. °¨ Feeling pain or pressure. °· Have a condition that: °¨ Makes them sleepy or less alert. °¨ Causes poor blood flow. °· Need to wear a medical device. °· Have poor control of their bladder or bowel functions (incontinence). °· Have poor nutrition (malnutrition). °· Are of certain ethnicities. People of African American and Latino or Hispanic descent are at higher risk compared to other ethnic groups. °If you are at risk for pressure ulcers, your health care provider may recommend certain types of bedding to help prevent them. These may include foam or gel mattresses covered with one of the following: °· A sheepskin blanket. °· A pad that is filled with gel, air, water, or foam. °SYMPTOMS  °The main symptom is a blister or change in skin color that opens into a wound. Other symptoms include:  °· Red or dark areas of skin that do not turn white or pale when  pressed with a finger.   °· Pain, warmth, or change of skin texture.   °DIAGNOSIS °This condition is diagnosed with a medical history and physical exam. You may also have tests, including:  °· Blood tests to check for infection or signs of poor nutrition. °· Imaging studies to check for damage to the deep tissues under your skin. °· Blood flow studies. °Your pressure injury will be staged to determine its severity. Staging is an assessment of: °· The depth of the pressure injury. °· Which tissues are exposed because of the pressure injury. °· The causes of the pressure injury.  °TREATMENT °The main focus of treatment is to help your injury heal. This may be done by:  °· Relieving or redistributing pressure on your skin. This includes: °¨ Frequently changing your position. °¨ Eliminating or minimizing positions that caused the wound or that can make the wound worse. °¨ Using specific bed mattresses and chair cushions. °¨ Refitting, resizing, or replacing any medical devices, or padding the skin under them. °¨ Using creams or powders to prevent rubbing (friction) on the skin. °· Keeping your skin clean and dry. This may include using a skin cleanser or skin protectant as told by your health care provider. This may be a lotion, ointment, or spray. °· Cleaning your injury and removing any dead tissue from the wound (debridement). °· Placing a bandage (dressing) over your injury. °· Preventing or treating infection. This may include antibiotic, antimicrobial, or antiseptic medicines. °Treatment may also include medicine for pain. °  Sometimes surgery is needed to close the wound with a flap of healthy skin or a piece of skin from another area of your body (graft). You may need surgery if other treatments are not working or if your injury is very deep. °HOME CARE INSTRUCTIONS °Wound Care °· Follow instructions from your health care provider about: °¨ How to take care of your wound. °¨ When and how you should change your  dressing. °¨ When you should remove your dressing. If your dressing is dry and stuck when you try to remove it, moisten or wet the dressing with saline or water so that it can be removed without harming your skin or wound tissue. °· Check your wound every day for signs of infection. Have a caregiver do this for you if you are not able. Watch for: °¨ More redness, swelling, or pain. °¨ More fluid, blood, or pus. °¨ A bad smell. °Skin Care °· Keep your skin clean and dry. Gently pat your skin dry. °· Do not rub or massage your skin. °· Use a skin protectant only as told by your health care provider. °· Check your skin every day for any changes in color or any new blisters or sores (ulcers). Have a caregiver do this for you if you are not able. °Medicines  °· Take over-the-counter and prescription medicines only as told by your health care provider. °· If you were prescribed an antibiotic medicine, take it or apply it as told by your health care provider. Do not stop taking or using the antibiotic even if your condition improves. °Reducing and Redistributing Pressure °· Do not lie or sit in one position for a long time. Move or change position every two hours or as told by your health care provider. °· Use pillows or cushions to reduce pressure. Ask your health care provider to recommend cushions or pads for you. °· Use medical devices that do not rub your skin. Tell your health care provider if one of your medical devices is causing a pressure injury to develop. °General Instructions °· Eat a healthy diet that includes lots of protein. Ask your health care provider for diet advice. °· Drink enough fluid to keep your urine clear or pale yellow. °· Be as active as you can every day. Ask your health care provider to suggest safe exercises or activities. °· Do not abuse drugs or alcohol. °· Keep all follow-up visits as told by your health care provider. This is important. °· Do not smoke. °SEEK MEDICAL CARE IF: °· You  have chills or fever. °· Your pain medicine is not helping. °· You have any changes in skin color. °· You have new blisters or sores. °· You develop warmth, redness, or swelling near a pressure injury. °· You have a bad odor or pus coming from your pressure injury. °· You lose control of your bowels or bladder. °· You develop new symptoms. °· Your wound does not improve after 1-2 weeks of treatment. °· You develop a new medical condition, such as diabetes, peripheral vascular disease, or conditions that affect your defense (immune) system. °  °This information is not intended to replace advice given to you by your health care provider. Make sure you discuss any questions you have with your health care provider. °  °Document Released: 02/03/2005 Document Revised: 10/25/2014 Document Reviewed: 06/14/2014 °Elsevier Interactive Patient Education ©2016 [employer] ° °

## 2015-01-09 LAB — LIPID PANEL
CHOL/HDL RATIO: 5.1 ratio — AB (ref 0.0–5.0)
Cholesterol, Total: 175 mg/dL (ref 100–199)
HDL: 34 mg/dL — ABNORMAL LOW (ref >39)
LDL CALC: 105 mg/dL — AB (ref 0–99)
Triglycerides: 180 mg/dL — ABNORMAL HIGH (ref 0–149)
VLDL CHOLESTEROL CAL: 36 mg/dL (ref 5–40)

## 2015-01-09 LAB — CMP14+EGFR
ALBUMIN: 3.7 g/dL (ref 3.6–4.8)
ALT: 16 IU/L (ref 0–44)
AST: 14 IU/L (ref 0–40)
Albumin/Globulin Ratio: 1.2 (ref 1.1–2.5)
Alkaline Phosphatase: 80 IU/L (ref 39–117)
BUN / CREAT RATIO: 20 (ref 10–22)
BUN: 11 mg/dL (ref 8–27)
Bilirubin Total: 0.5 mg/dL (ref 0.0–1.2)
CO2: 27 mmol/L (ref 18–29)
CREATININE: 0.54 mg/dL — AB (ref 0.76–1.27)
Calcium: 9.5 mg/dL (ref 8.6–10.2)
Chloride: 99 mmol/L (ref 97–106)
GFR calc non Af Amer: 112 mL/min/{1.73_m2} (ref >59)
GFR, EST AFRICAN AMERICAN: 129 mL/min/{1.73_m2} (ref >59)
GLOBULIN, TOTAL: 3 g/dL (ref 1.5–4.5)
GLUCOSE: 69 mg/dL (ref 65–99)
Potassium: 4.1 mmol/L (ref 3.5–5.2)
SODIUM: 140 mmol/L (ref 136–144)
TOTAL PROTEIN: 6.7 g/dL (ref 6.0–8.5)

## 2015-01-11 ENCOUNTER — Other Ambulatory Visit: Payer: Self-pay | Admitting: Nurse Practitioner

## 2015-02-21 ENCOUNTER — Other Ambulatory Visit: Payer: Self-pay | Admitting: Dermatology

## 2015-02-21 DIAGNOSIS — D239 Other benign neoplasm of skin, unspecified: Secondary | ICD-10-CM | POA: Diagnosis not present

## 2015-02-21 DIAGNOSIS — L821 Other seborrheic keratosis: Secondary | ICD-10-CM | POA: Diagnosis not present

## 2015-02-21 DIAGNOSIS — L570 Actinic keratosis: Secondary | ICD-10-CM | POA: Diagnosis not present

## 2015-02-21 DIAGNOSIS — D485 Neoplasm of uncertain behavior of skin: Secondary | ICD-10-CM | POA: Diagnosis not present

## 2015-02-21 DIAGNOSIS — L814 Other melanin hyperpigmentation: Secondary | ICD-10-CM | POA: Diagnosis not present

## 2015-03-01 DIAGNOSIS — N419 Inflammatory disease of prostate, unspecified: Secondary | ICD-10-CM | POA: Diagnosis not present

## 2015-03-06 ENCOUNTER — Other Ambulatory Visit: Payer: Self-pay | Admitting: Physician Assistant

## 2015-03-06 DIAGNOSIS — R972 Elevated prostate specific antigen [PSA]: Secondary | ICD-10-CM | POA: Diagnosis not present

## 2015-03-06 DIAGNOSIS — Z0000 Encounter for general adult medical examination without abnormal findings: Secondary | ICD-10-CM | POA: Diagnosis not present

## 2015-03-19 ENCOUNTER — Other Ambulatory Visit: Payer: Self-pay | Admitting: Nurse Practitioner

## 2015-03-21 DIAGNOSIS — N319 Neuromuscular dysfunction of bladder, unspecified: Secondary | ICD-10-CM | POA: Diagnosis not present

## 2015-03-21 DIAGNOSIS — R339 Retention of urine, unspecified: Secondary | ICD-10-CM | POA: Diagnosis not present

## 2015-03-21 DIAGNOSIS — S71009A Unspecified open wound, unspecified hip, initial encounter: Secondary | ICD-10-CM | POA: Diagnosis not present

## 2015-03-21 DIAGNOSIS — N3941 Urge incontinence: Secondary | ICD-10-CM | POA: Diagnosis not present

## 2015-04-04 ENCOUNTER — Other Ambulatory Visit: Payer: Self-pay | Admitting: Nurse Practitioner

## 2015-04-04 DIAGNOSIS — R972 Elevated prostate specific antigen [PSA]: Secondary | ICD-10-CM | POA: Diagnosis not present

## 2015-04-10 DIAGNOSIS — N3000 Acute cystitis without hematuria: Secondary | ICD-10-CM | POA: Diagnosis not present

## 2015-04-10 DIAGNOSIS — Z0000 Encounter for general adult medical examination without abnormal findings: Secondary | ICD-10-CM | POA: Diagnosis not present

## 2015-04-10 DIAGNOSIS — R972 Elevated prostate specific antigen [PSA]: Secondary | ICD-10-CM | POA: Diagnosis not present

## 2015-04-13 ENCOUNTER — Ambulatory Visit: Payer: PPO | Admitting: Nurse Practitioner

## 2015-04-17 ENCOUNTER — Encounter: Payer: Self-pay | Admitting: Nurse Practitioner

## 2015-04-23 ENCOUNTER — Other Ambulatory Visit: Payer: Self-pay | Admitting: Nurse Practitioner

## 2015-04-23 ENCOUNTER — Telehealth: Payer: Self-pay | Admitting: Nurse Practitioner

## 2015-04-23 ENCOUNTER — Encounter (INDEPENDENT_AMBULATORY_CARE_PROVIDER_SITE_OTHER): Payer: Self-pay

## 2015-04-23 ENCOUNTER — Encounter: Payer: Self-pay | Admitting: Nurse Practitioner

## 2015-04-23 ENCOUNTER — Ambulatory Visit (INDEPENDENT_AMBULATORY_CARE_PROVIDER_SITE_OTHER): Payer: PPO | Admitting: Nurse Practitioner

## 2015-04-23 ENCOUNTER — Other Ambulatory Visit: Payer: Self-pay

## 2015-04-23 VITALS — BP 165/87 | HR 95 | Temp 97.4°F

## 2015-04-23 DIAGNOSIS — K2100 Gastro-esophageal reflux disease with esophagitis, without bleeding: Secondary | ICD-10-CM

## 2015-04-23 DIAGNOSIS — L03032 Cellulitis of left toe: Secondary | ICD-10-CM

## 2015-04-23 DIAGNOSIS — E785 Hyperlipidemia, unspecified: Secondary | ICD-10-CM

## 2015-04-23 DIAGNOSIS — R339 Retention of urine, unspecified: Secondary | ICD-10-CM | POA: Diagnosis not present

## 2015-04-23 DIAGNOSIS — I10 Essential (primary) hypertension: Secondary | ICD-10-CM | POA: Diagnosis not present

## 2015-04-23 DIAGNOSIS — Z23 Encounter for immunization: Secondary | ICD-10-CM

## 2015-04-23 DIAGNOSIS — F17200 Nicotine dependence, unspecified, uncomplicated: Secondary | ICD-10-CM

## 2015-04-23 DIAGNOSIS — N319 Neuromuscular dysfunction of bladder, unspecified: Secondary | ICD-10-CM

## 2015-04-23 DIAGNOSIS — G8220 Paraplegia, unspecified: Secondary | ICD-10-CM | POA: Diagnosis not present

## 2015-04-23 DIAGNOSIS — Z720 Tobacco use: Secondary | ICD-10-CM

## 2015-04-23 MED ORDER — OMEPRAZOLE 20 MG PO CPDR: DELAYED_RELEASE_CAPSULE | ORAL | Status: DC

## 2015-04-23 MED ORDER — SIMVASTATIN 40 MG PO TABS: ORAL_TABLET | ORAL | Status: DC

## 2015-04-23 MED ORDER — VALSARTAN-HYDROCHLOROTHIAZIDE 160-25 MG PO TABS: 1.0000 | ORAL_TABLET | Freq: Every day | ORAL | Status: DC

## 2015-04-23 MED ORDER — TRIMETHOPRIM 100 MG PO TABS: 100.0000 mg | ORAL_TABLET | Freq: Every day | ORAL | Status: DC

## 2015-04-23 NOTE — Addendum Note (Signed)
Addended by: Chevis Pretty on: 04/23/2015 10:39 AM   Modules accepted: Orders
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
## 2015-04-23 NOTE — Patient Instructions (Signed)
Wound Care °Taking care of your wound properly can help to prevent pain and infection. It can also help your wound to heal more quickly.  °HOW TO CARE FOR YOUR WOUND  °· Take or apply over-the-counter and prescription medicines only as told by your health care provider. °· If you were prescribed antibiotic medicine, take or apply it as told by your health care provider. Do not stop using the antibiotic even if your condition improves. °· Clean the wound each day or as told by your health care provider. °¨ Wash the wound with mild soap and water. °¨ Rinse the wound with water to remove all soap. °¨ Pat the wound dry with a clean towel. Do not rub it. °· There are many different ways to close and cover a wound. For example, a wound can be covered with stitches (sutures), skin glue, or adhesive strips. Follow instructions from your health care provider about: °¨ How to take care of your wound. °¨ When and how you should change your bandage (dressing). °¨ When you should remove your dressing. °¨ Removing whatever was used to close your wound. °· Check your wound every day for signs of infection. Watch for: °¨ Redness, swelling, or pain. °¨ Fluid, blood, or pus. °· Keep the dressing dry until your health care provider says it can be removed. Do not take baths, swim, use a hot tub, or do anything that would put your wound underwater until your health care provider approves. °· Raise (elevate) the injured area above the level of your heart while you are sitting or lying down. °· Do not scratch or pick at the wound. °· Keep all follow-up visits as told by your health care provider. This is important. °SEEK MEDICAL CARE IF: °· You received a tetanus shot and you have swelling, severe pain, redness, or bleeding at the injection site. °· You have a fever. °· Your pain is not controlled with medicine. °· You have increased redness, swelling, or pain at the site of your wound. °· You have fluid, blood, or pus coming from your  wound. °· You notice a bad smell coming from your wound or your dressing. °SEEK IMMEDIATE MEDICAL CARE IF: °· You have a red streak going away from your wound. °  °This information is not intended to replace advice given to you by your health care provider. Make sure you discuss any questions you have with your health care provider. °  °Document Released: 11/13/2007 Document Revised: 06/20/2014 Document Reviewed: 01/30/2014 °Elsevier Interactive Patient Education ©2016 Elsevier Inc. ° °

## 2015-04-23 NOTE — Addendum Note (Signed)
Addended by: Rolena Infante on: 04/23/2015 10:51 AM   Modules accepted: Orders

## 2015-04-23 NOTE — Telephone Encounter (Signed)
Patient has already picked up rx from pharmacy

## 2015-04-27 DIAGNOSIS — S71009A Unspecified open wound, unspecified hip, initial encounter: Secondary | ICD-10-CM | POA: Diagnosis not present

## 2015-04-27 DIAGNOSIS — N319 Neuromuscular dysfunction of bladder, unspecified: Secondary | ICD-10-CM | POA: Diagnosis not present

## 2015-04-27 DIAGNOSIS — R339 Retention of urine, unspecified: Secondary | ICD-10-CM | POA: Diagnosis not present

## 2015-04-27 DIAGNOSIS — N3941 Urge incontinence: Secondary | ICD-10-CM | POA: Diagnosis not present

## 2015-05-01 DIAGNOSIS — G8220 Paraplegia, unspecified: Secondary | ICD-10-CM | POA: Diagnosis not present

## 2015-05-01 DIAGNOSIS — M869 Osteomyelitis, unspecified: Secondary | ICD-10-CM | POA: Diagnosis not present

## 2015-05-01 DIAGNOSIS — L97519 Non-pressure chronic ulcer of other part of right foot with unspecified severity: Secondary | ICD-10-CM | POA: Diagnosis not present

## 2015-05-01 DIAGNOSIS — L97529 Non-pressure chronic ulcer of other part of left foot with unspecified severity: Secondary | ICD-10-CM | POA: Diagnosis not present

## 2015-05-01 DIAGNOSIS — Z79899 Other long term (current) drug therapy: Secondary | ICD-10-CM | POA: Diagnosis not present

## 2015-05-01 DIAGNOSIS — L97523 Non-pressure chronic ulcer of other part of left foot with necrosis of muscle: Secondary | ICD-10-CM | POA: Diagnosis not present

## 2015-05-03 DIAGNOSIS — R339 Retention of urine, unspecified: Secondary | ICD-10-CM | POA: Diagnosis not present

## 2015-05-03 DIAGNOSIS — S71009A Unspecified open wound, unspecified hip, initial encounter: Secondary | ICD-10-CM | POA: Diagnosis not present

## 2015-05-03 DIAGNOSIS — N319 Neuromuscular dysfunction of bladder, unspecified: Secondary | ICD-10-CM | POA: Diagnosis not present

## 2015-05-03 DIAGNOSIS — N3941 Urge incontinence: Secondary | ICD-10-CM | POA: Diagnosis not present

## 2015-05-14 DIAGNOSIS — R972 Elevated prostate specific antigen [PSA]: Secondary | ICD-10-CM | POA: Diagnosis not present

## 2015-05-15 DIAGNOSIS — M86672 Other chronic osteomyelitis, left ankle and foot: Secondary | ICD-10-CM | POA: Diagnosis not present

## 2015-05-15 DIAGNOSIS — M869 Osteomyelitis, unspecified: Secondary | ICD-10-CM | POA: Diagnosis not present

## 2015-05-18 ENCOUNTER — Other Ambulatory Visit: Payer: Self-pay | Admitting: Nurse Practitioner

## 2015-05-21 DIAGNOSIS — R972 Elevated prostate specific antigen [PSA]: Secondary | ICD-10-CM | POA: Diagnosis not present

## 2015-05-21 DIAGNOSIS — Z0000 Encounter for general adult medical examination without abnormal findings: Secondary | ICD-10-CM | POA: Diagnosis not present

## 2015-05-21 NOTE — Telephone Encounter (Signed)
Last seen 04/23/15  MMM If approved route to nurse to call into [facility]

## 2015-05-21 NOTE — Telephone Encounter (Signed)
rx called into pharmacy

## 2015-05-21 NOTE — Telephone Encounter (Signed)
rx ready for pickup 

## 2015-05-22 DIAGNOSIS — L97529 Non-pressure chronic ulcer of other part of left foot with unspecified severity: Secondary | ICD-10-CM | POA: Diagnosis not present

## 2015-05-22 DIAGNOSIS — L97519 Non-pressure chronic ulcer of other part of right foot with unspecified severity: Secondary | ICD-10-CM | POA: Diagnosis not present

## 2015-05-22 DIAGNOSIS — G8220 Paraplegia, unspecified: Secondary | ICD-10-CM | POA: Diagnosis not present

## 2015-05-28 DIAGNOSIS — S71009A Unspecified open wound, unspecified hip, initial encounter: Secondary | ICD-10-CM | POA: Diagnosis not present

## 2015-05-28 DIAGNOSIS — N319 Neuromuscular dysfunction of bladder, unspecified: Secondary | ICD-10-CM | POA: Diagnosis not present

## 2015-05-28 DIAGNOSIS — R339 Retention of urine, unspecified: Secondary | ICD-10-CM | POA: Diagnosis not present

## 2015-05-28 DIAGNOSIS — N3941 Urge incontinence: Secondary | ICD-10-CM | POA: Diagnosis not present

## 2015-06-05 DIAGNOSIS — L97522 Non-pressure chronic ulcer of other part of left foot with fat layer exposed: Secondary | ICD-10-CM | POA: Diagnosis not present

## 2015-06-05 DIAGNOSIS — L97512 Non-pressure chronic ulcer of other part of right foot with fat layer exposed: Secondary | ICD-10-CM | POA: Diagnosis not present

## 2015-06-19 DIAGNOSIS — L97511 Non-pressure chronic ulcer of other part of right foot limited to breakdown of skin: Secondary | ICD-10-CM | POA: Diagnosis not present

## 2015-06-19 DIAGNOSIS — L97522 Non-pressure chronic ulcer of other part of left foot with fat layer exposed: Secondary | ICD-10-CM | POA: Diagnosis not present

## 2015-06-19 DIAGNOSIS — L97519 Non-pressure chronic ulcer of other part of right foot with unspecified severity: Secondary | ICD-10-CM | POA: Diagnosis not present

## 2015-06-19 DIAGNOSIS — M86672 Other chronic osteomyelitis, left ankle and foot: Secondary | ICD-10-CM | POA: Diagnosis not present

## 2015-06-19 DIAGNOSIS — G8220 Paraplegia, unspecified: Secondary | ICD-10-CM | POA: Diagnosis not present

## 2015-06-19 DIAGNOSIS — L97529 Non-pressure chronic ulcer of other part of left foot with unspecified severity: Secondary | ICD-10-CM | POA: Diagnosis not present

## 2015-06-19 DIAGNOSIS — M869 Osteomyelitis, unspecified: Secondary | ICD-10-CM | POA: Diagnosis not present

## 2015-06-22 ENCOUNTER — Ambulatory Visit (INDEPENDENT_AMBULATORY_CARE_PROVIDER_SITE_OTHER): Payer: PPO | Admitting: Family Medicine

## 2015-06-22 ENCOUNTER — Encounter (INDEPENDENT_AMBULATORY_CARE_PROVIDER_SITE_OTHER): Payer: Self-pay

## 2015-06-22 ENCOUNTER — Telehealth: Payer: Self-pay | Admitting: Nurse Practitioner

## 2015-06-22 ENCOUNTER — Telehealth: Payer: Self-pay

## 2015-06-22 ENCOUNTER — Encounter: Payer: Self-pay | Admitting: Family Medicine

## 2015-06-22 VITALS — BP 132/86 | HR 91 | Temp 98.2°F

## 2015-06-22 DIAGNOSIS — A084 Viral intestinal infection, unspecified: Secondary | ICD-10-CM | POA: Diagnosis not present

## 2015-06-22 DIAGNOSIS — J309 Allergic rhinitis, unspecified: Secondary | ICD-10-CM | POA: Diagnosis not present

## 2015-06-22 MED ORDER — FLUTICASONE PROPIONATE 50 MCG/ACT NA SUSP: 1.0000 | Freq: Two times a day (BID) | NASAL | Status: DC | PRN

## 2015-06-22 MED ORDER — ONDANSETRON 4 MG PO TBDP: 4.0000 mg | ORAL_TABLET | Freq: Three times a day (TID) | ORAL | Status: DC | PRN

## 2015-06-22 NOTE — Progress Notes (Signed)
 BP 132/86 mmHg  Pulse 91  Temp(Src) 98.2 F (36.8 C) (Oral)  Ht   Wt    Subjective:    Patient ID: Dustin Medina, male    DOB: 02/01/1951, 64 y.o.   MRN: [id number]  HPI: Dustin Medina is a 64 y.o. male presenting on 06/22/2015 for Fatigue and Vomiting   HPI Fatigue and vomiting Patient comes in today with fatigue vomiting and nausea within going on for the past 24 hours. He feels like he may have eaten something yesterday that might of started this. He denies any sick contacts that he knows of. He is having trouble keeping down food in general. Patient is also paraplegic but he denies any new urinary issues or hematuria.  Relevant past medical, surgical, family and social history reviewed and updated as indicated. Interim medical history since our last visit reviewed. Allergies and medications reviewed and updated.  Review of Systems  Constitutional: Positive for fatigue. Negative for fever and chills.  HENT: Negative for ear discharge and ear pain.   Eyes: Negative for discharge and visual disturbance.  Respiratory: Negative for cough, shortness of breath and wheezing.   Cardiovascular: Negative for chest pain and leg swelling.  Gastrointestinal: Positive for nausea, vomiting and diarrhea. Negative for abdominal pain and constipation.  Genitourinary: Negative for difficulty urinating.  Musculoskeletal: Negative for back pain and gait problem.  Skin: Negative for rash.  Neurological: Negative for dizziness, syncope, light-headedness and headaches.  All other systems reviewed and are negative.   Per HPI unless specifically indicated above     Medication List       This list is accurate as of: 06/22/15  4:57 PM.  Always use your most recent med list.               acetaminophen 325 MG tablet  Commonly known as:  TYLENOL  Take 650 mg by mouth every 6 (six) hours as needed.     Co Q 10 100 MG Caps  Take by mouth daily.     cyclobenzaprine 10 MG tablet  Commonly  known as:  FLEXERIL  TAKE 1 TABLET (10 MG TOTAL) BY MOUTH 3 (THREE) TIMES DAILY AS NEEDED FOR MUSCLE SPASMS.     doxycycline 100 MG capsule  Commonly known as:  VIBRAMYCIN  Take 100 mg by mouth 2 (two) times daily.     fluticasone 50 MCG/ACT nasal spray  Commonly known as:  FLONASE  Place 1 spray into both nostrils 2 (two) times daily as needed for allergies or rhinitis.     meloxicam 15 MG tablet  Commonly known as:  MOBIC  TAKE 1 TABLET BY MOUTH EVERY DAY AS NEEDED     multivitamin with minerals Tabs tablet  Take 1 tablet by mouth daily.     niacin 1000 MG CR tablet  Commonly known as:  NIASPAN  TAKE 1 TABLET (1,000 MG TOTAL) BY MOUTH AT BEDTIME.     omeprazole 20 MG capsule  Commonly known as:  PRILOSEC  TAKE 1 CAPSULE (20 MG TOTAL) BY MOUTH 2 (TWO) TIMES DAILY BEFORE A MEAL.     ondansetron 4 MG disintegrating tablet  Commonly known as:  ZOFRAN ODT  Take 1 tablet (4 mg total) by mouth every 8 (eight) hours as needed for nausea or vomiting.     promethazine 25 MG tablet  Commonly known as:  PHENERGAN  Take 1 tablet (25 mg total) by mouth every 8 (eight) hours as needed for nausea or   vomiting.     simvastatin 40 MG tablet  Commonly known as:  ZOCOR  TAKE 1 TABLET (40 MG TOTAL) BY MOUTH AT BEDTIME. NEEDS TO BE SEEN     sulfamethoxazole-trimethoprim 800-160 MG tablet  Commonly known as:  BACTRIM DS,SEPTRA DS  Take 1 tablet by mouth 2 (two) times daily.     testosterone cypionate 200 MG/ML injection  Commonly known as:  DEPOTESTOSTERONE CYPIONATE  INJECT 0.75 ML INTO THE MUSCLE EVERY 14 DAYS     trimethoprim 100 MG tablet  Commonly known as:  TRIMPEX  Take 1 tablet (100 mg total) by mouth daily.     valsartan-hydrochlorothiazide 160-25 MG tablet  Commonly known as:  DIOVAN HCT  Take 1 tablet by mouth daily.           Objective:    BP 132/86 mmHg  Pulse 91  Temp(Src) 98.2 F (36.8 C) (Oral)  Ht   Wt   Wt Readings from Last 3 Encounters:  10/02/13 178  lb 12.7 oz (81.1 kg)  04/08/13 175 lb 4.3 oz (79.5 kg)  08/02/12 150 lb (68.04 kg)    Physical Exam  Constitutional: He is oriented to person, place, and time. He appears well-developed and well-nourished. No distress.  Eyes: Conjunctivae and EOM are normal. Pupils are equal, round, and reactive to light. Right eye exhibits no discharge. No scleral icterus.  Neck: Neck supple. No thyromegaly present.  Cardiovascular: Normal rate, regular rhythm, normal heart sounds and intact distal pulses.   No murmur heard. Pulmonary/Chest: Effort normal and breath sounds normal. No respiratory distress. He has no wheezes.  Abdominal: Soft. Bowel sounds are normal. He exhibits no distension and no mass. There is tenderness (Mild epigastric). There is no rebound and no guarding.  Musculoskeletal: Normal range of motion. He exhibits no edema.  Lymphadenopathy:    He has no cervical adenopathy.  Neurological: He is alert and oriented to person, place, and time. Coordination normal.  Skin: Skin is warm and dry. No rash noted. He is not diaphoretic.  Psychiatric: He has a normal mood and affect. His behavior is normal.  Vitals reviewed.   Results for orders placed or performed in visit on 01/08/15  CMP14+EGFR  Result Value Ref Range   Glucose 69 65 - 99 mg/dL   BUN 11 8 - 27 mg/dL   Creatinine, Ser 0.54 (L) 0.76 - 1.27 mg/dL   GFR calc non Af Amer 112 >59 mL/min/1.73   GFR calc Af Amer 129 >59 mL/min/1.73   BUN/Creatinine Ratio 20 10 - 22   Sodium 140 136 - 144 mmol/L   Potassium 4.1 3.5 - 5.2 mmol/L   Chloride 99 97 - 106 mmol/L   CO2 27 18 - 29 mmol/L   Calcium 9.5 8.6 - 10.2 mg/dL   Total Protein 6.7 6.0 - 8.5 g/dL   Albumin 3.7 3.6 - 4.8 g/dL   Globulin, Total 3.0 1.5 - 4.5 g/dL   Albumin/Globulin Ratio 1.2 1.1 - 2.5   Bilirubin Total 0.5 0.0 - 1.2 mg/dL   Alkaline Phosphatase 80 39 - 117 IU/L   AST 14 0 - 40 IU/L   ALT 16 0 - 44 IU/L  Lipid panel  Result Value Ref Range   Cholesterol,  Total 175 100 - 199 mg/dL   Triglycerides 180 (H) 0 - 149 mg/dL   HDL 34 (L) >39 mg/dL   VLDL Cholesterol Cal 36 5 - 40 mg/dL   LDL Calculated 105 (H) 0 - 99 mg/dL     Chol/HDL Ratio 5.1 (H) 0.0 - 5.0 ratio units      Assessment & Plan:   Problem List Items Addressed This Visit    None    Visit Diagnoses    Viral gastroenteritis    -  Primary    Relevant Medications    sulfamethoxazole-trimethoprim (BACTRIM DS,SEPTRA DS) 800-160 MG tablet    ondansetron (ZOFRAN ODT) 4 MG disintegrating tablet    Allergic rhinitis, unspecified allergic rhinitis type        Relevant Medications    fluticasone (FLONASE) 50 MCG/ACT nasal spray        Follow up plan: Return if symptoms worsen or fail to improve.  Counseling provided for all of the vaccine components No orders of the defined types were placed in this encounter.    Joshua Dettinger, MD [facility] 06/22/2015, 4:57 PM      

## 2015-06-22 NOTE — Telephone Encounter (Signed)
Patient of [facility] 

## 2015-06-22 NOTE — Telephone Encounter (Signed)
Called patient and he had came in to be seen today. Disregard message. Just FYI

## 2015-06-22 NOTE — Telephone Encounter (Signed)
Do you mind calling him and asking him what he needed?  Laroy Apple, MD [facility] 06/22/2015, 6:30 PM

## 2015-06-28 ENCOUNTER — Other Ambulatory Visit: Payer: Self-pay

## 2015-06-28 DIAGNOSIS — S71009A Unspecified open wound, unspecified hip, initial encounter: Secondary | ICD-10-CM | POA: Diagnosis not present

## 2015-06-28 DIAGNOSIS — N3941 Urge incontinence: Secondary | ICD-10-CM | POA: Diagnosis not present

## 2015-06-28 DIAGNOSIS — K529 Noninfective gastroenteritis and colitis, unspecified: Secondary | ICD-10-CM

## 2015-06-28 DIAGNOSIS — R509 Fever, unspecified: Secondary | ICD-10-CM

## 2015-06-28 DIAGNOSIS — R339 Retention of urine, unspecified: Secondary | ICD-10-CM | POA: Diagnosis not present

## 2015-06-28 DIAGNOSIS — N319 Neuromuscular dysfunction of bladder, unspecified: Secondary | ICD-10-CM | POA: Diagnosis not present

## 2015-06-28 MED ORDER — PROMETHAZINE HCL 25 MG PO TABS: 25.0000 mg | ORAL_TABLET | Freq: Three times a day (TID) | ORAL | Status: DC | PRN

## 2015-06-28 NOTE — Telephone Encounter (Signed)
Patient was seen on 5/5

## 2015-06-28 NOTE — Telephone Encounter (Signed)
Patient seen on 5-5. Now requesting promethazine 25mg . Please advise

## 2015-07-04 DIAGNOSIS — L97524 Non-pressure chronic ulcer of other part of left foot with necrosis of bone: Secondary | ICD-10-CM | POA: Diagnosis not present

## 2015-07-05 ENCOUNTER — Other Ambulatory Visit: Payer: Self-pay | Admitting: Nurse Practitioner

## 2015-07-13 ENCOUNTER — Other Ambulatory Visit: Payer: Self-pay | Admitting: Nurse Practitioner

## 2015-08-01 DIAGNOSIS — M86672 Other chronic osteomyelitis, left ankle and foot: Secondary | ICD-10-CM | POA: Diagnosis not present

## 2015-08-02 ENCOUNTER — Other Ambulatory Visit (INDEPENDENT_AMBULATORY_CARE_PROVIDER_SITE_OTHER): Payer: PPO

## 2015-08-02 DIAGNOSIS — E785 Hyperlipidemia, unspecified: Secondary | ICD-10-CM | POA: Diagnosis not present

## 2015-08-02 DIAGNOSIS — I10 Essential (primary) hypertension: Secondary | ICD-10-CM | POA: Diagnosis not present

## 2015-08-02 LAB — LIPID PANEL
CHOLESTEROL TOTAL: 160 mg/dL (ref 100–199)
Chol/HDL Ratio: 5.3 ratio units — ABNORMAL HIGH (ref 0.0–5.0)
HDL: 30 mg/dL — ABNORMAL LOW (ref >39)
LDL CALC: 81 mg/dL (ref 0–99)
TRIGLYCERIDES: 247 mg/dL — AB (ref 0–149)
VLDL Cholesterol Cal: 49 mg/dL — ABNORMAL HIGH (ref 5–40)

## 2015-08-02 LAB — CMP14+EGFR
A/G RATIO: 1.5 (ref 1.2–2.2)
ALBUMIN: 3.8 g/dL (ref 3.6–4.8)
ALK PHOS: 74 IU/L (ref 39–117)
ALT: 17 IU/L (ref 0–44)
AST: 18 IU/L (ref 0–40)
BILIRUBIN TOTAL: 0.8 mg/dL (ref 0.0–1.2)
BUN / CREAT RATIO: 24 (ref 10–24)
BUN: 18 mg/dL (ref 8–27)
CHLORIDE: 99 mmol/L (ref 96–106)
CO2: 22 mmol/L (ref 18–29)
Calcium: 9.3 mg/dL (ref 8.6–10.2)
Creatinine, Ser: 0.74 mg/dL — ABNORMAL LOW (ref 0.76–1.27)
GFR calc Af Amer: 113 mL/min/{1.73_m2} (ref >59)
GFR calc non Af Amer: 97 mL/min/{1.73_m2} (ref >59)
GLOBULIN, TOTAL: 2.6 g/dL (ref 1.5–4.5)
Glucose: 92 mg/dL (ref 65–99)
POTASSIUM: 4.4 mmol/L (ref 3.5–5.2)
SODIUM: 137 mmol/L (ref 134–144)
Total Protein: 6.4 g/dL (ref 6.0–8.5)

## 2015-08-06 ENCOUNTER — Telehealth: Payer: Self-pay | Admitting: Nurse Practitioner

## 2015-08-06 NOTE — Telephone Encounter (Signed)
Patient aware of results.

## 2015-08-13 ENCOUNTER — Other Ambulatory Visit: Payer: Self-pay | Admitting: Nurse Practitioner

## 2015-08-30 DIAGNOSIS — S71009A Unspecified open wound, unspecified hip, initial encounter: Secondary | ICD-10-CM | POA: Diagnosis not present

## 2015-08-30 DIAGNOSIS — N319 Neuromuscular dysfunction of bladder, unspecified: Secondary | ICD-10-CM | POA: Diagnosis not present

## 2015-08-30 DIAGNOSIS — N3941 Urge incontinence: Secondary | ICD-10-CM | POA: Diagnosis not present

## 2015-08-30 DIAGNOSIS — R339 Retention of urine, unspecified: Secondary | ICD-10-CM | POA: Diagnosis not present

## 2015-09-06 ENCOUNTER — Encounter: Payer: Self-pay | Admitting: Family Medicine

## 2015-09-17 DIAGNOSIS — C61 Malignant neoplasm of prostate: Secondary | ICD-10-CM | POA: Diagnosis not present

## 2015-09-28 ENCOUNTER — Other Ambulatory Visit: Payer: Self-pay | Admitting: Nurse Practitioner

## 2015-10-04 DIAGNOSIS — S71009A Unspecified open wound, unspecified hip, initial encounter: Secondary | ICD-10-CM | POA: Diagnosis not present

## 2015-10-04 DIAGNOSIS — N3941 Urge incontinence: Secondary | ICD-10-CM | POA: Diagnosis not present

## 2015-10-04 DIAGNOSIS — R339 Retention of urine, unspecified: Secondary | ICD-10-CM | POA: Diagnosis not present

## 2015-10-04 DIAGNOSIS — N319 Neuromuscular dysfunction of bladder, unspecified: Secondary | ICD-10-CM | POA: Diagnosis not present

## 2015-10-25 ENCOUNTER — Other Ambulatory Visit: Payer: Self-pay | Admitting: Nurse Practitioner

## 2015-10-25 ENCOUNTER — Other Ambulatory Visit: Payer: Self-pay | Admitting: Family

## 2015-10-25 DIAGNOSIS — I10 Essential (primary) hypertension: Secondary | ICD-10-CM

## 2015-11-03 ENCOUNTER — Other Ambulatory Visit: Payer: Self-pay | Admitting: Nurse Practitioner

## 2015-11-09 ENCOUNTER — Other Ambulatory Visit: Payer: Self-pay

## 2015-11-09 DIAGNOSIS — I10 Essential (primary) hypertension: Secondary | ICD-10-CM

## 2015-11-09 DIAGNOSIS — R339 Retention of urine, unspecified: Secondary | ICD-10-CM | POA: Diagnosis not present

## 2015-11-09 DIAGNOSIS — N319 Neuromuscular dysfunction of bladder, unspecified: Secondary | ICD-10-CM | POA: Diagnosis not present

## 2015-11-09 DIAGNOSIS — S71009A Unspecified open wound, unspecified hip, initial encounter: Secondary | ICD-10-CM | POA: Diagnosis not present

## 2015-11-09 DIAGNOSIS — N3941 Urge incontinence: Secondary | ICD-10-CM | POA: Diagnosis not present

## 2015-11-09 MED ORDER — NIACIN ER (ANTIHYPERLIPIDEMIC) 1000 MG PO TBCR: EXTENDED_RELEASE_TABLET | ORAL | 0 refills | Status: DC

## 2015-11-09 MED ORDER — VALSARTAN-HYDROCHLOROTHIAZIDE 160-25 MG PO TABS: 1.0000 | ORAL_TABLET | Freq: Every day | ORAL | 0 refills | Status: DC

## 2015-11-13 DIAGNOSIS — R8271 Bacteriuria: Secondary | ICD-10-CM | POA: Diagnosis not present

## 2015-11-13 DIAGNOSIS — R972 Elevated prostate specific antigen [PSA]: Secondary | ICD-10-CM | POA: Diagnosis not present

## 2015-11-13 DIAGNOSIS — N3000 Acute cystitis without hematuria: Secondary | ICD-10-CM | POA: Diagnosis not present

## 2015-11-20 DIAGNOSIS — N3020 Other chronic cystitis without hematuria: Secondary | ICD-10-CM | POA: Diagnosis not present

## 2015-11-20 DIAGNOSIS — R972 Elevated prostate specific antigen [PSA]: Secondary | ICD-10-CM | POA: Diagnosis not present

## 2015-11-21 ENCOUNTER — Other Ambulatory Visit: Payer: Self-pay | Admitting: Urology

## 2015-11-21 DIAGNOSIS — R972 Elevated prostate specific antigen [PSA]: Secondary | ICD-10-CM

## 2015-12-04 ENCOUNTER — Other Ambulatory Visit: Payer: Self-pay | Admitting: Nurse Practitioner

## 2015-12-06 ENCOUNTER — Ambulatory Visit (HOSPITAL_COMMUNITY)
Admission: RE | Admit: 2015-12-06 | Discharge: 2015-12-06 | Disposition: A | Discharge status: Home / Self Care | Payer: PPO | Source: Ambulatory Visit | Attending: Urology | Admitting: Urology

## 2015-12-06 DIAGNOSIS — R972 Elevated prostate specific antigen [PSA]: Secondary | ICD-10-CM | POA: Diagnosis not present

## 2015-12-06 DIAGNOSIS — R590 Localized enlarged lymph nodes: Secondary | ICD-10-CM | POA: Diagnosis not present

## 2015-12-06 DIAGNOSIS — N400 Enlarged prostate without lower urinary tract symptoms: Secondary | ICD-10-CM | POA: Insufficient documentation

## 2015-12-06 LAB — POCT I-STAT CREATININE: CREATININE: 0.7 mg/dL (ref 0.61–1.24)

## 2015-12-06 MED ORDER — GADOBENATE DIMEGLUMINE 529 MG/ML IV SOLN
20.0000 mL | Freq: Once | INTRAVENOUS | Status: AC | PRN
  Administered 2015-12-06: 16 mL via INTRAVENOUS

## 2015-12-10 ENCOUNTER — Other Ambulatory Visit: Payer: Self-pay | Admitting: Nurse Practitioner

## 2015-12-18 ENCOUNTER — Telehealth: Payer: Self-pay | Admitting: Nurse Practitioner

## 2015-12-18 NOTE — Telephone Encounter (Signed)
Called in.

## 2015-12-19 ENCOUNTER — Other Ambulatory Visit: Payer: Self-pay | Admitting: Nurse Practitioner

## 2015-12-21 DIAGNOSIS — R972 Elevated prostate specific antigen [PSA]: Secondary | ICD-10-CM | POA: Diagnosis not present

## 2015-12-25 ENCOUNTER — Ambulatory Visit (INDEPENDENT_AMBULATORY_CARE_PROVIDER_SITE_OTHER): Payer: PPO | Admitting: Family

## 2015-12-25 ENCOUNTER — Encounter: Payer: Self-pay | Admitting: Family

## 2015-12-25 VITALS — BP 145/83 | HR 93 | Temp 98.1°F

## 2015-12-25 DIAGNOSIS — R509 Fever, unspecified: Secondary | ICD-10-CM

## 2015-12-25 DIAGNOSIS — G8929 Other chronic pain: Secondary | ICD-10-CM

## 2015-12-25 DIAGNOSIS — N3000 Acute cystitis without hematuria: Secondary | ICD-10-CM

## 2015-12-25 DIAGNOSIS — R112 Nausea with vomiting, unspecified: Secondary | ICD-10-CM | POA: Diagnosis not present

## 2015-12-25 DIAGNOSIS — M545 Low back pain, unspecified: Secondary | ICD-10-CM

## 2015-12-25 LAB — URINALYSIS, COMPLETE
Glucose, UA: NEGATIVE
LEUKOCYTES UA: NEGATIVE
NITRITE UA: POSITIVE — AB
PH UA: 6 (ref 5.0–7.5)
RBC, UA: NEGATIVE
Specific Gravity, UA: >1.03 — ABNORMAL HIGH (ref 1.005–1.030)
Urobilinogen, Ur: 2 mg/dL — ABNORMAL HIGH (ref 0.2–1.0)

## 2015-12-25 LAB — MICROSCOPIC EXAMINATION: RBC MICROSCOPIC, UA: NONE SEEN /HPF (ref 0–?)

## 2015-12-25 MED ORDER — CIPROFLOXACIN HCL 500 MG PO TABS: 500.0000 mg | ORAL_TABLET | Freq: Two times a day (BID) | ORAL | 0 refills | Status: DC

## 2015-12-25 NOTE — Progress Notes (Signed)
   Subjective:    Patient ID: AZENDE ALGER, male    DOB: Dec 21, 1950, 65 y.o.   MRN: [id number]  Emesis   This is a new problem. The current episode started in the past 7 days. The problem occurs 2 to 4 times per day. The problem has been gradually improving. The emesis has an appearance of stomach contents. The maximum temperature recorded prior to his arrival was 100.4 - 100.9 F. Associated symptoms include abdominal pain, chills, dizziness, a fever, headaches and myalgias. Pertinent negatives include no coughing. He has tried bed rest, sleep and acetaminophen for the symptoms. The treatment provided mild relief.      Review of Systems  Constitutional: Positive for chills and fever.  Respiratory: Negative for cough.   Gastrointestinal: Positive for abdominal pain and vomiting.  Musculoskeletal: Positive for myalgias.  Neurological: Positive for dizziness and headaches.  All other systems reviewed and are negative.      Objective:   Physical Exam  Constitutional: He is oriented to person, place, and time. He appears well-developed and well-nourished. No distress.  HENT:  Head: Normocephalic.  Eyes: Pupils are equal, round, and reactive to light. Right eye exhibits no discharge. Left eye exhibits no discharge.  Neck: Normal range of motion. Neck supple. No thyromegaly present.  Cardiovascular: Normal rate, regular rhythm, normal heart sounds and intact distal pulses.   No murmur heard. Pulmonary/Chest: Effort normal and breath sounds normal. No respiratory distress. He has no wheezes.  Abdominal: Soft. Bowel sounds are normal. He exhibits no distension. There is tenderness (mild abd tenderness).  Musculoskeletal: He exhibits no edema or tenderness.  Paraplegic, in wheelchair  Neurological: He is alert and oriented to person, place, and time.  Skin: Skin is warm and dry. No rash noted. No erythema.  Psychiatric: He has a normal mood and affect. His behavior is normal. Judgment and  thought content normal.  Vitals reviewed.     BP (!) 145/83 (BP Location: Left Arm, Patient Position: Sitting, Cuff Size: Normal)   Pulse 93   Temp 98.1 F (36.7 C) (Oral)      Assessment & Plan:  1. Fever, unspecified fever cause - Urinalysis, Complete  2. Intractable vomiting with nausea, unspecified vomiting type - Urinalysis, Complete  3. Chronic midline low back pain without sciatica - Tens unit  4. Acute cystitis without hematuria -Force fluids AZO over the counter X2 days RTO prn Culture pending - ciprofloxacin (CIPRO) 500 MG tablet; Take 1 tablet (500 mg total) by mouth 2 (two) times daily.  Dispense: 20 tablet; Refill: 0 - Urine culture  Evelina Dun, FNP

## 2015-12-25 NOTE — Patient Instructions (Signed)

## 2015-12-26 LAB — URINE CULTURE

## 2015-12-29 ENCOUNTER — Telehealth: Payer: Self-pay | Admitting: Nurse Practitioner

## 2015-12-29 NOTE — Telephone Encounter (Signed)
Can you review culture and route back to Pool B. Patient is still symptomatic

## 2015-12-31 DIAGNOSIS — S71009A Unspecified open wound, unspecified hip, initial encounter: Secondary | ICD-10-CM | POA: Diagnosis not present

## 2015-12-31 DIAGNOSIS — R339 Retention of urine, unspecified: Secondary | ICD-10-CM | POA: Diagnosis not present

## 2015-12-31 DIAGNOSIS — N3941 Urge incontinence: Secondary | ICD-10-CM | POA: Diagnosis not present

## 2015-12-31 DIAGNOSIS — N319 Neuromuscular dysfunction of bladder, unspecified: Secondary | ICD-10-CM | POA: Diagnosis not present

## 2015-12-31 NOTE — Telephone Encounter (Signed)
Culture did not grow anything

## 2015-12-31 NOTE — Telephone Encounter (Signed)
Pt aware.

## 2016-01-03 ENCOUNTER — Telehealth: Payer: Self-pay | Admitting: Nurse Practitioner

## 2016-01-03 NOTE — Telephone Encounter (Signed)
done

## 2016-01-07 ENCOUNTER — Other Ambulatory Visit: Payer: Self-pay | Admitting: *Deleted

## 2016-01-07 ENCOUNTER — Other Ambulatory Visit: Payer: PPO

## 2016-01-07 DIAGNOSIS — Z0189 Encounter for other specified special examinations: Secondary | ICD-10-CM

## 2016-01-07 LAB — URINALYSIS, COMPLETE
Bilirubin, UA: NEGATIVE
GLUCOSE, UA: NEGATIVE
Ketones, UA: NEGATIVE
Nitrite, UA: NEGATIVE
PH UA: 8 — AB (ref 5.0–7.5)
RBC, UA: NEGATIVE
Specific Gravity, UA: 1.01 (ref 1.005–1.030)
UUROB: 0.2 mg/dL (ref 0.2–1.0)

## 2016-01-07 LAB — MICROSCOPIC EXAMINATION
Bacteria, UA: NONE SEEN
Epithelial Cells (non renal): NONE SEEN /hpf (ref 0–10)
RBC, UA: NONE SEEN /hpf (ref 0–?)
RENAL EPITHEL UA: NONE SEEN /HPF

## 2016-01-15 ENCOUNTER — Other Ambulatory Visit: Payer: Self-pay

## 2016-01-15 MED ORDER — MELOXICAM 15 MG PO TABS: 15.0000 mg | ORAL_TABLET | Freq: Every day | ORAL | 0 refills | Status: DC | PRN

## 2016-02-12 ENCOUNTER — Other Ambulatory Visit: Payer: Self-pay | Admitting: Nurse Practitioner

## 2016-02-12 DIAGNOSIS — S71009A Unspecified open wound, unspecified hip, initial encounter: Secondary | ICD-10-CM | POA: Diagnosis not present

## 2016-02-12 DIAGNOSIS — N3941 Urge incontinence: Secondary | ICD-10-CM | POA: Diagnosis not present

## 2016-02-12 DIAGNOSIS — I10 Essential (primary) hypertension: Secondary | ICD-10-CM

## 2016-02-12 DIAGNOSIS — R339 Retention of urine, unspecified: Secondary | ICD-10-CM | POA: Diagnosis not present

## 2016-02-12 DIAGNOSIS — N319 Neuromuscular dysfunction of bladder, unspecified: Secondary | ICD-10-CM | POA: Diagnosis not present

## 2016-02-20 ENCOUNTER — Ambulatory Visit (INDEPENDENT_AMBULATORY_CARE_PROVIDER_SITE_OTHER): Payer: PPO | Admitting: Family Medicine

## 2016-02-20 ENCOUNTER — Encounter: Payer: Self-pay | Admitting: Family Medicine

## 2016-02-20 VITALS — BP 180/92 | HR 87 | Temp 97.2°F | Ht 71.0 in

## 2016-02-20 DIAGNOSIS — R339 Retention of urine, unspecified: Secondary | ICD-10-CM | POA: Diagnosis not present

## 2016-02-20 DIAGNOSIS — R300 Dysuria: Secondary | ICD-10-CM | POA: Diagnosis not present

## 2016-02-20 DIAGNOSIS — G8220 Paraplegia, unspecified: Secondary | ICD-10-CM

## 2016-02-20 DIAGNOSIS — N319 Neuromuscular dysfunction of bladder, unspecified: Secondary | ICD-10-CM

## 2016-02-20 DIAGNOSIS — N3000 Acute cystitis without hematuria: Secondary | ICD-10-CM

## 2016-02-20 LAB — URINALYSIS, COMPLETE
Bilirubin, UA: NEGATIVE
GLUCOSE, UA: NEGATIVE
KETONES UA: NEGATIVE
Nitrite, UA: POSITIVE — AB
RBC, UA: NEGATIVE
SPEC GRAV UA: 1.02 (ref 1.005–1.030)
Urobilinogen, Ur: 1 mg/dL (ref 0.2–1.0)
pH, UA: 8 — ABNORMAL HIGH (ref 5.0–7.5)

## 2016-02-20 LAB — MICROSCOPIC EXAMINATION: WBC, UA: >30 /hpf — AB (ref 0–?)

## 2016-02-20 MED ORDER — CIPROFLOXACIN HCL 500 MG PO TABS: 500.0000 mg | ORAL_TABLET | Freq: Two times a day (BID) | ORAL | 0 refills | Status: DC

## 2016-02-20 NOTE — Progress Notes (Signed)
Subjective:  Patient ID: Dustin Medina, male    DOB: April 08, 1950  Age: 66 y.o. MRN: [id number]  CC: Urinary Urgency (pt here today c/o urinary urgency)   HPI Dustin Medina presents for burning with urination and frequency for several days. Denies fever . No flank pain. No nausea, vomiting.   History Dustin Medina has a past medical history of Blood transfusion; Burn; Carpal tunnel syndrome; Carpal tunnel syndrome, bilateral; Decubitus ulcer; Diverticulosis (1/08); Encounter for urinary catheterization; GERD (gastroesophageal reflux disease); Hiatal hernia; HTN (hypertension); Hyperlipidemia; Lipoma; Paralysis ([place]); Peptic stricture of esophagus (12/18/09); PONV (postoperative nausea and vomiting); Pulmonary embolus ([place]) (1970); Sleep apnea; Tobacco smoker within last 12 months; and UTI (lower urinary tract infection).   He has a past surgical history that includes Hernia repair (02/03/2001); left leg surgery due to staph infection (2005); Back surgery (1969); dilation of esophagus; coloncoscopy; SURGERY FOR DECUBITUS ULCER; Multiple tooth extractions; Lipoma excision (07/07/2011); Esophagogastroduodenoscopy (N/A, 07/26/2012); and carpal tunnel (Right, 11/23/13).   His family history includes Diabetes in his sister; Hyperlipidemia in his mother.He reports that he has been smoking Cigarettes.  He has been smoking about 0.10 packs per day. He has never used smokeless tobacco. He reports that he drinks about 0.6 - 1.2 oz of alcohol per week . He reports that he does not use drugs.    ROS Review of Systems  Constitutional: Negative for chills, diaphoresis and fever.  HENT: Negative for rhinorrhea and sore throat.   Respiratory: Negative for cough and shortness of breath.   Cardiovascular: Negative for chest pain.  Gastrointestinal: Negative for abdominal pain.  Genitourinary: Positive for dysuria and frequency. Negative for flank pain, scrotal swelling and testicular pain.  Musculoskeletal: Negative  for arthralgias and myalgias.  Skin: Negative for rash.  Neurological: Negative for weakness and headaches.    Objective:  BP (!) 180/92   Pulse 87   Temp 97.2 F (36.2 C) (Oral)   Ht 5\' 11"  (1.803 m)   BP Readings from Last 3 Encounters:  02/25/16 112/70  02/20/16 (!) 180/92  12/25/15 (!) 145/83    Wt Readings from Last 3 Encounters:  10/02/13 178 lb 12.7 oz (81.1 kg)  04/08/13 175 lb 4.3 oz (79.5 kg)  08/02/12 150 lb (68 kg)     Physical Exam  Constitutional: He appears well-developed and well-nourished.  HENT:  Head: Normocephalic and atraumatic.  Right Ear: Tympanic membrane and external ear normal. No decreased hearing is noted.  Left Ear: Tympanic membrane and external ear normal. No decreased hearing is noted.  Mouth/Throat: No oropharyngeal exudate or posterior oropharyngeal erythema.  Eyes: Pupils are equal, round, and reactive to light.  Neck: Normal range of motion. Neck supple.  Cardiovascular: Normal rate and regular rhythm.   No murmur heard. Pulmonary/Chest: Breath sounds normal. No respiratory distress.  Abdominal: Soft. Bowel sounds are normal. He exhibits no mass. There is no tenderness.  Genitourinary: Prostate is enlarged (boggy).  Vitals reviewed.    Lab Results  Component Value Date   WBC 9.6 10/03/2013   HGB 11.2 (L) 10/03/2013   HCT 33.3 (L) 10/03/2013   PLT 251 10/03/2013   GLUCOSE 92 08/02/2015   CHOL 160 08/02/2015   TRIG 247 (H) 08/02/2015   HDL 30 (L) 08/02/2015   LDLCALC 81 08/02/2015   ALT 17 08/02/2015   AST 18 08/02/2015   NA 137 08/02/2015   K 4.4 08/02/2015   CL 99 08/02/2015   CREATININE 0.70 12/06/2015   BUN 18 08/02/2015  CO2 22 08/02/2015   TSH 0.410 07/24/2012   PSA 15.0 (H) 10/10/2013    Mr Prostate W / Wo Cm  Result Date: 12/06/2015 CLINICAL DATA:  Elevated serum PSA level.  No previous biopsy. EXAM: MR PROSTATE WITHOUT AND WITH CONTRAST TECHNIQUE: Multiplanar multisequence MRI images were obtained of the  pelvis centered about the prostate. Pre and post contrast images were obtained. CONTRAST:  83mL MULTIHANCE GADOBENATE DIMEGLUMINE 529 MG/ML IV SOLN COMPARISON:  None. FINDINGS: Prostate: Mildly enlarged with mild mass effect on bladder base. Central gland shows mild enlargement with small BPH nodules, however no suspicious T2 hypointense central gland nodule identified. Diffusion imaging is partially degraded by artifact. Indistinct ADC hypointensity is seen within the peripheral zone, however no focal ADC hypointense nodules are identified. T2 weighted images show diffuse mild hypo intensity throughout the peripheral zone, but no focal or geographic T2 hypointense nodules are visualized. Transcapsular spread:  Absent Seminal vesicle involvement: Absent Neurovascular bundle involvement: Absent Pelvic adenopathy: Mildly enlarged left external iliac lymph nodes are seen, largest measuring 14 mm on image 26/4. Right external iliac lymph nodes are noted, largest measuring 10 mm on image 22/4. Bone metastasis: Absent Other findings: Mild diffuse bladder wall thickening, likely due to chronic bladder outlet obstruction. IMPRESSION: No radiographic evidence of high-grade macroscopic prostate carcinoma. Mildly enlarged prostate with central gland BPH nodules and probable chronic bladder outlet obstruction. Nonspecific mild bilateral external iliac lymphadenopathy, with largest lymph node measuring 14 mm on the left. Electronically Signed   By: Dustin Medina M.D.   On: 12/06/2015 16:32    Assessment & Plan:   [name] was seen today for urinary urgency.  Diagnoses and all orders for this visit:  Dysuria -     Urinalysis, Complete -     Microscopic Examination  Paraplegic spinal paralysis ([place])  Urinary retention  Neurogenic bladder  Acute cystitis without hematuria -     ciprofloxacin (CIPRO) 500 MG tablet; Take 1 tablet (500 mg total) by mouth 2 (two) times daily.      I have discontinued Dustin Medina  ciprofloxacin. I am also having him start on ciprofloxacin. Additionally, I am having him maintain his multivitamin with minerals, cyclobenzaprine, acetaminophen, omeprazole, simvastatin, Co Q 10, fluticasone, ondansetron, promethazine, trimethoprim, testosterone cypionate, meloxicam, and valsartan-hydrochlorothiazide.  Meds ordered this encounter  Medications  . ciprofloxacin (CIPRO) 500 MG tablet    Sig: Take 1 tablet (500 mg total) by mouth 2 (two) times daily.    Dispense:  30 tablet    Refill:  0     Follow-up: Return if symptoms worsen or fail to improve.  Claretta Fraise, M.D.

## 2016-02-23 ENCOUNTER — Other Ambulatory Visit: Payer: Self-pay | Admitting: Nurse Practitioner

## 2016-02-25 ENCOUNTER — Telehealth: Payer: Self-pay | Admitting: Family Medicine

## 2016-02-25 ENCOUNTER — Ambulatory Visit (INDEPENDENT_AMBULATORY_CARE_PROVIDER_SITE_OTHER): Payer: PPO | Admitting: Family Medicine

## 2016-02-25 ENCOUNTER — Encounter: Payer: Self-pay | Admitting: Family Medicine

## 2016-02-25 VITALS — BP 112/70 | HR 84 | Temp 99.1°F

## 2016-02-25 DIAGNOSIS — J209 Acute bronchitis, unspecified: Secondary | ICD-10-CM | POA: Diagnosis not present

## 2016-02-25 MED ORDER — CEFDINIR 300 MG PO CAPS: 300.0000 mg | ORAL_CAPSULE | Freq: Two times a day (BID) | ORAL | 0 refills | Status: DC

## 2016-02-25 NOTE — Telephone Encounter (Signed)
Lm 1/8--jhb -- need to know all symptoms he is having

## 2016-02-25 NOTE — Progress Notes (Signed)
BP 112/70   Pulse 84   Temp 99.1 F (37.3 C) (Oral)    Subjective:    Patient ID: Dustin Medina, male    DOB: 1951-01-12, 66 y.o.   MRN: [id number]  HPI: Dustin Medina is a 66 y.o. male presenting on 02/25/2016 for Cough (has been on Cipro since 02/20/16 when he saw Dr. Livia Snellen)   HPI Cough and congestion Patient is coming in with cough and congestion and achiness that has been going on for the past 2 days. He was seen for a urinary issue and then treated for UTI with ciprofloxacin since 02/20/2016 which is 5 days ago. Congestion and achiness in chills developed after that fact. He denies any true fevers or shortness of breath or wheezing. He has cough is been mostly dry and nonproductive. He feels like it is starting to get drainage down into his chest and having more congestion in that area.  Relevant past medical, surgical, family and social history reviewed and updated as indicated. Interim medical history since our last visit reviewed. Allergies and medications reviewed and updated.  Review of Systems  Constitutional: Negative for chills and fever.  HENT: Positive for congestion, postnasal drip, rhinorrhea, sinus pressure and sore throat. Negative for ear discharge, ear pain, sneezing and voice change.   Eyes: Negative for pain, discharge, redness and visual disturbance.  Respiratory: Positive for cough. Negative for shortness of breath and wheezing.   Cardiovascular: Negative for chest pain and leg swelling.  Musculoskeletal: Positive for myalgias. Negative for gait problem.  Skin: Negative for rash.  All other systems reviewed and are negative.   Per HPI unless specifically indicated above   Allergies as of 02/25/2016   No Known Allergies     Medication List       Accurate as of 02/25/16  4:47 PM. Always use your most recent med list.          acetaminophen 325 MG tablet Commonly known as:  TYLENOL Take 650 mg by mouth every 6 (six) hours as needed.   cefdinir 300  MG capsule Commonly known as:  OMNICEF Take 1 capsule (300 mg total) by mouth 2 (two) times daily. 1 po BID   ciprofloxacin 500 MG tablet Commonly known as:  CIPRO Take 1 tablet (500 mg total) by mouth 2 (two) times daily.   Co Q 10 100 MG Caps Take by mouth daily.   cyclobenzaprine 10 MG tablet Commonly known as:  FLEXERIL TAKE 1 TABLET (10 MG TOTAL) BY MOUTH 3 (THREE) TIMES DAILY AS NEEDED FOR MUSCLE SPASMS.   fluticasone 50 MCG/ACT nasal spray Commonly known as:  FLONASE Place 1 spray into both nostrils 2 (two) times daily as needed for allergies or rhinitis.   meloxicam 15 MG tablet Commonly known as:  MOBIC Take 1 tablet (15 mg total) by mouth daily as needed.   multivitamin with minerals Tabs tablet Take 1 tablet by mouth daily.   niacin 1000 MG CR tablet Commonly known as:  NIASPAN TAKE 1 TABLET (1,000 MG TOTAL) BY MOUTH AT BEDTIME.   omeprazole 20 MG capsule Commonly known as:  PRILOSEC TAKE 1 CAPSULE (20 MG TOTAL) BY MOUTH 2 (TWO) TIMES DAILY BEFORE A MEAL.   ondansetron 4 MG disintegrating tablet Commonly known as:  ZOFRAN ODT Take 1 tablet (4 mg total) by mouth every 8 (eight) hours as needed for nausea or vomiting.   promethazine 25 MG tablet Commonly known as:  PHENERGAN Take 1 tablet (25 mg  total) by mouth every 8 (eight) hours as needed for nausea or vomiting.   simvastatin 40 MG tablet Commonly known as:  ZOCOR TAKE 1 TABLET (40 MG TOTAL) BY MOUTH AT BEDTIME. NEEDS TO BE SEEN   testosterone cypionate 200 MG/ML injection Commonly known as:  DEPOTESTOSTERONE CYPIONATE INJECT 0.75 ML INTRAMUSCULARLY EVERY 14 DAYS   trimethoprim 100 MG tablet Commonly known as:  TRIMPEX TAKE 1 TABLET BY MOUTH EVERY DAY   valsartan-hydrochlorothiazide 160-25 MG tablet Commonly known as:  DIOVAN-HCT TAKE 1 TABLET BY MOUTH DAILY.          Objective:    BP 112/70   Pulse 84   Temp 99.1 F (37.3 C) (Oral)   Wt Readings from Last 3 Encounters:  10/02/13 178  lb 12.7 oz (81.1 kg)  04/08/13 175 lb 4.3 oz (79.5 kg)  08/02/12 150 lb (68 kg)    Physical Exam  Constitutional: He is oriented to person, place, and time. He appears well-developed and well-nourished. No distress.  HENT:  Right Ear: Tympanic membrane, external ear and ear canal normal.  Left Ear: Tympanic membrane, external ear and ear canal normal.  Nose: Mucosal edema and rhinorrhea present. No sinus tenderness. No epistaxis. Right sinus exhibits no maxillary sinus tenderness and no frontal sinus tenderness. Left sinus exhibits no maxillary sinus tenderness and no frontal sinus tenderness.  Mouth/Throat: Uvula is midline and mucous membranes are normal. Posterior oropharyngeal edema and posterior oropharyngeal erythema present. No oropharyngeal exudate or tonsillar abscesses.  Eyes: Conjunctivae are normal. Right eye exhibits no discharge. Left eye exhibits no discharge. No scleral icterus.  Neck: Neck supple. No thyromegaly present.  Cardiovascular: Normal rate, regular rhythm, normal heart sounds and intact distal pulses.   No murmur heard. Pulmonary/Chest: Effort normal and breath sounds normal. No respiratory distress. He has no wheezes. He has no rales.  Musculoskeletal: Normal range of motion. He exhibits no edema.  Lymphadenopathy:    He has no cervical adenopathy.  Neurological: He is alert and oriented to person, place, and time. Coordination normal.  Skin: Skin is warm and dry. No rash noted. He is not diaphoretic.  Psychiatric: He has a normal mood and affect. His behavior is normal.  Nursing note and vitals reviewed.     Assessment & Plan:   Problem List Items Addressed This Visit    None    Visit Diagnoses    Acute bronchitis, unspecified organism    -  Primary   Relevant Medications   cefdinir (OMNICEF) 300 MG capsule       Follow up plan: Return if symptoms worsen or fail to improve.  Counseling provided for all of the vaccine components No orders of the  defined types were placed in this encounter.   Caryl Pina, MD [facility] 02/25/2016, 4:47 PM

## 2016-02-26 NOTE — Telephone Encounter (Signed)
Patient seen yesterday in office.

## 2016-03-18 ENCOUNTER — Encounter: Payer: Self-pay | Admitting: Internal Medicine

## 2016-03-26 ENCOUNTER — Other Ambulatory Visit: Payer: Self-pay | Admitting: Nurse Practitioner

## 2016-03-28 DIAGNOSIS — N319 Neuromuscular dysfunction of bladder, unspecified: Secondary | ICD-10-CM | POA: Diagnosis not present

## 2016-03-28 DIAGNOSIS — N390 Urinary tract infection, site not specified: Secondary | ICD-10-CM | POA: Diagnosis not present

## 2016-03-28 DIAGNOSIS — R339 Retention of urine, unspecified: Secondary | ICD-10-CM | POA: Diagnosis not present

## 2016-04-13 ENCOUNTER — Other Ambulatory Visit: Payer: Self-pay | Admitting: Nurse Practitioner

## 2016-05-09 ENCOUNTER — Ambulatory Visit (INDEPENDENT_AMBULATORY_CARE_PROVIDER_SITE_OTHER): Payer: PPO | Admitting: Family

## 2016-05-09 ENCOUNTER — Ambulatory Visit: Payer: PPO | Admitting: Family Medicine

## 2016-05-09 ENCOUNTER — Encounter: Payer: Self-pay | Admitting: Family

## 2016-05-09 VITALS — BP 142/87 | HR 92 | Temp 97.4°F

## 2016-05-09 DIAGNOSIS — R3989 Other symptoms and signs involving the genitourinary system: Secondary | ICD-10-CM | POA: Diagnosis not present

## 2016-05-09 DIAGNOSIS — N3001 Acute cystitis with hematuria: Secondary | ICD-10-CM

## 2016-05-09 LAB — URINALYSIS, COMPLETE
BILIRUBIN UA: NEGATIVE
Glucose, UA: NEGATIVE
Ketones, UA: NEGATIVE
Nitrite, UA: NEGATIVE
PH UA: 7 (ref 5.0–7.5)
Specific Gravity, UA: 1.02 (ref 1.005–1.030)
Urobilinogen, Ur: 0.2 mg/dL (ref 0.2–1.0)

## 2016-05-09 LAB — MICROSCOPIC EXAMINATION
RENAL EPITHEL UA: NONE SEEN /HPF
WBC, UA: >30 /hpf — AB (ref 0–?)

## 2016-05-09 MED ORDER — CIPROFLOXACIN HCL 500 MG PO TABS: 500.0000 mg | ORAL_TABLET | Freq: Two times a day (BID) | ORAL | 0 refills | Status: DC

## 2016-05-09 NOTE — Progress Notes (Signed)
   Subjective:    Patient ID: Dustin Medina, male    DOB: 04/10/1950, 66 y.o.   MRN: [id number]  HPI Pt presents to the office with changes in color of urine. PT is a paraplegia so no dysuria or frequency, but is having a "milky discharge" and odor that started about a week ago that has become worse. PT self caths 5 times a day. Pt has tried increase fluid with no relief.    Review of Systems  Genitourinary: Positive for hematuria. Negative for flank pain.  All other systems reviewed and are negative.      Objective:   Physical Exam  Constitutional: He is oriented to person, place, and time. He appears well-developed and well-nourished. No distress.  HENT:  Head: Normocephalic.  Right Ear: External ear normal.  Left Ear: External ear normal.  Mouth/Throat: Oropharynx is clear and moist.  Eyes: Pupils are equal, round, and reactive to light. Right eye exhibits no discharge. Left eye exhibits no discharge.  Neck: Normal range of motion. Neck supple. No thyromegaly present.  Cardiovascular: Normal rate, regular rhythm, normal heart sounds and intact distal pulses.   No murmur heard. Pulmonary/Chest: Effort normal and breath sounds normal. No respiratory distress. He has no wheezes.  Abdominal: Soft. Bowel sounds are normal. He exhibits no distension. There is no tenderness.  Musculoskeletal: He exhibits no edema or tenderness.  Paraplegia, wheelchair bound  Neurological: He is alert and oriented to person, place, and time.  Skin: Skin is warm and dry. No rash noted. No erythema.  Psychiatric: He has a normal mood and affect. His behavior is normal. Judgment and thought content normal.  Vitals reviewed.     BP (!) 142/87   Pulse 92   Temp 97.4 F (36.3 C) (Oral)      Assessment & Plan:  1. Urine troubles - Urinalysis, Complete  2. Acute cystitis with hematuria -Force fluids AZO over the counter X2 days RTO prn Culture pending - ciprofloxacin (CIPRO) 500 MG tablet;  Take 1 tablet (500 mg total) by mouth 2 (two) times daily.  Dispense: 20 tablet; Refill: 0 - Urine culture   Evelina Dun, FNP

## 2016-05-09 NOTE — Patient Instructions (Signed)
Pyelonephritis, Adult Pyelonephritis is a kidney infection. The kidneys are the organs that filter a person's blood and move waste out of the bloodstream and into the urine. Urine passes from the kidneys, through the ureters, and into the bladder. There are two main types of pyelonephritis:  Infections that come on quickly without any warning (acute pyelonephritis).  Infections that last for a long period of time (chronic pyelonephritis). In most cases, the infection clears up with treatment and does not cause further problems. More severe infections or chronic infections can sometimes spread to the bloodstream or lead to other problems with the kidneys. What are the causes? This condition is usually caused by:  Bacteria traveling from the bladder to the kidney through infected urine. The urine in the bladder can become infected with bacteria from:  Bladder infection (cystitis).  Inflammation of the prostate gland (prostatitis).  Sexual intercourse, in females.  Bacteria traveling from the bloodstream to the kidney. What increases the risk? This condition is more likely to develop in:  Pregnant women.  Older people.  People who have diabetes.  People who have kidney stones or bladder stones.  People who have other abnormalities of the kidney or ureter.  People who have a catheter placed in the bladder.  People who have cancer.  People who are sexually active.  Women who use spermicides.  People who have had a prior urinary tract infection. What are the signs or symptoms? Symptoms of this condition include:  Frequent urination.  Strong or persistent urge to urinate.  Burning or stinging when urinating.  Abdominal pain.  Back pain.  Pain in the side or flank area.  Fever.  Chills.  Blood in the urine, or dark urine.  Nausea.  Vomiting. How is this diagnosed? This condition may be diagnosed based on:  Medical history and physical exam.  Urine  tests.  Blood tests. You may also have imaging tests of the kidneys, such as an ultrasound or CT scan. How is this treated? Treatment for this condition may depend on the severity of the infection.  If the infection is mild and is found early, you may be treated with antibiotic medicines taken by mouth. You will need to drink fluids to remain hydrated.  If the infection is more severe, you may need to stay in the hospital and receive antibiotics given directly into a vein through an IV tube. You may also need to receive fluids through an IV tube if you are not able to remain hydrated. After your hospital stay, you may need to take oral antibiotics for a period of time. Other treatments may be required, depending on the cause of the infection. Follow these instructions at home: Medicines   Take over-the-counter and prescription medicines only as told by your health care provider.  If you were prescribed an antibiotic medicine, take it as told by your health care provider. Do not stop taking the antibiotic even if you start to feel better. General instructions   Drink enough fluid to keep your urine clear or pale yellow.  Avoid caffeine, tea, and carbonated beverages. They tend to irritate the bladder.  Urinate often. Avoid holding in urine for long periods of time.  Urinate before and after sex.  After a bowel movement, women should cleanse from front to back. Use each tissue only once.  Keep all follow-up visits as told by your health care provider. This is important. Contact a health care provider if:  Your symptoms do not get better  after 2 days of treatment.  Your symptoms get worse.  You have a fever. Get help right away if:  You are unable to take your antibiotics or fluids.  You have shaking chills.  You vomit.  You have severe flank or back pain.  You have extreme weakness or fainting. This information is not intended to replace advice given to you by your  health care provider. Make sure you discuss any questions you have with your health care provider. Document Released: 02/03/2005 Document Revised: 07/12/2015 Document Reviewed: 05/29/2014 Elsevier Interactive Patient Education  2017 [employer].

## 2016-05-13 ENCOUNTER — Other Ambulatory Visit: Payer: Self-pay | Admitting: Family

## 2016-05-13 LAB — URINE CULTURE

## 2016-05-13 MED ORDER — NITROFURANTOIN MONOHYD MACRO 100 MG PO CAPS: 100.0000 mg | ORAL_CAPSULE | Freq: Two times a day (BID) | ORAL | 0 refills | Status: DC

## 2016-05-15 ENCOUNTER — Telehealth: Payer: Self-pay | Admitting: Nurse Practitioner

## 2016-05-15 NOTE — Telephone Encounter (Signed)
I spoke with patient he states that he is good with all meds and doesn't need refills.

## 2016-05-15 NOTE — Telephone Encounter (Signed)
What is the name of the medication? All med  Have you contacted your pharmacy to request a refill? yes  Which pharmacy would you like this sent to? [facility] [place] and in the future for all meds   Patient notified that their request is being sent to the clinical staff for review and that they should receive a call once it is complete. If they do not receive a call within 24 hours they can check with their pharmacy or our office.

## 2016-05-21 DIAGNOSIS — N319 Neuromuscular dysfunction of bladder, unspecified: Secondary | ICD-10-CM | POA: Diagnosis not present

## 2016-05-21 DIAGNOSIS — R339 Retention of urine, unspecified: Secondary | ICD-10-CM | POA: Diagnosis not present

## 2016-05-21 DIAGNOSIS — N390 Urinary tract infection, site not specified: Secondary | ICD-10-CM | POA: Diagnosis not present

## 2016-05-22 ENCOUNTER — Other Ambulatory Visit: Payer: Self-pay | Admitting: Nurse Practitioner

## 2016-05-26 ENCOUNTER — Other Ambulatory Visit: Payer: Self-pay | Admitting: *Deleted

## 2016-05-26 ENCOUNTER — Other Ambulatory Visit: Payer: PPO

## 2016-05-26 DIAGNOSIS — R8290 Unspecified abnormal findings in urine: Secondary | ICD-10-CM | POA: Diagnosis not present

## 2016-05-26 LAB — URINALYSIS, COMPLETE
BILIRUBIN UA: NEGATIVE
Glucose, UA: NEGATIVE
Ketones, UA: NEGATIVE
NITRITE UA: NEGATIVE
PH UA: 7 (ref 5.0–7.5)
RBC UA: NEGATIVE
Specific Gravity, UA: >1.03 — ABNORMAL HIGH (ref 1.005–1.030)
UUROB: 0.2 mg/dL (ref 0.2–1.0)

## 2016-05-26 LAB — MICROSCOPIC EXAMINATION
EPITHELIAL CELLS (NON RENAL): NONE SEEN /HPF (ref 0–10)
RBC, UA: NONE SEEN /hpf (ref 0–?)
RENAL EPITHEL UA: NONE SEEN /HPF

## 2016-05-27 ENCOUNTER — Other Ambulatory Visit: Payer: Self-pay | Admitting: Family

## 2016-05-27 MED ORDER — CIPROFLOXACIN HCL 500 MG PO TABS: 500.0000 mg | ORAL_TABLET | Freq: Two times a day (BID) | ORAL | 0 refills | Status: DC

## 2016-05-29 ENCOUNTER — Other Ambulatory Visit: Payer: Self-pay | Admitting: Family

## 2016-05-29 LAB — URINE CULTURE

## 2016-05-29 MED ORDER — NITROFURANTOIN MONOHYD MACRO 100 MG PO CAPS: 100.0000 mg | ORAL_CAPSULE | Freq: Two times a day (BID) | ORAL | 0 refills | Status: DC

## 2016-05-30 ENCOUNTER — Other Ambulatory Visit: Payer: Self-pay

## 2016-05-30 MED ORDER — NITROFURANTOIN MONOHYD MACRO 100 MG PO CAPS: 100.0000 mg | ORAL_CAPSULE | Freq: Two times a day (BID) | ORAL | 0 refills | Status: DC

## 2016-05-30 NOTE — Progress Notes (Unsigned)
Rx resent to correct pharmacy

## 2016-05-30 NOTE — Progress Notes (Unsigned)
Patient aware and requesting rx be sent to [facility] in [place] instesd. Rx re sent.

## 2016-06-09 DIAGNOSIS — R300 Dysuria: Secondary | ICD-10-CM | POA: Diagnosis not present

## 2016-06-09 DIAGNOSIS — N319 Neuromuscular dysfunction of bladder, unspecified: Secondary | ICD-10-CM | POA: Diagnosis not present

## 2016-06-09 DIAGNOSIS — N3020 Other chronic cystitis without hematuria: Secondary | ICD-10-CM | POA: Diagnosis not present

## 2016-06-27 DIAGNOSIS — R339 Retention of urine, unspecified: Secondary | ICD-10-CM | POA: Diagnosis not present

## 2016-06-27 DIAGNOSIS — N390 Urinary tract infection, site not specified: Secondary | ICD-10-CM | POA: Diagnosis not present

## 2016-06-27 DIAGNOSIS — N319 Neuromuscular dysfunction of bladder, unspecified: Secondary | ICD-10-CM | POA: Diagnosis not present

## 2016-07-03 ENCOUNTER — Other Ambulatory Visit: Payer: Self-pay | Admitting: Nurse Practitioner

## 2016-07-09 ENCOUNTER — Other Ambulatory Visit: Payer: Self-pay | Admitting: Nurse Practitioner

## 2016-07-10 NOTE — Telephone Encounter (Signed)
Last filled 05/30/16. Last testosterone level was 06/2013!! Call in

## 2016-07-16 ENCOUNTER — Ambulatory Visit (INDEPENDENT_AMBULATORY_CARE_PROVIDER_SITE_OTHER): Payer: PPO | Admitting: Pediatrics

## 2016-07-16 ENCOUNTER — Encounter: Payer: Self-pay | Admitting: Pediatrics

## 2016-07-16 VITALS — BP 167/88 | HR 81 | Temp 97.9°F

## 2016-07-16 DIAGNOSIS — L97929 Non-pressure chronic ulcer of unspecified part of left lower leg with unspecified severity: Secondary | ICD-10-CM

## 2016-07-16 DIAGNOSIS — L89152 Pressure ulcer of sacral region, stage 2: Secondary | ICD-10-CM

## 2016-07-16 DIAGNOSIS — I10 Essential (primary) hypertension: Secondary | ICD-10-CM | POA: Diagnosis not present

## 2016-07-16 LAB — BASIC METABOLIC PANEL
BUN/Creatinine Ratio: 22 (ref 10–24)
BUN: 12 mg/dL (ref 8–27)
CALCIUM: 9 mg/dL (ref 8.6–10.2)
CHLORIDE: 98 mmol/L (ref 96–106)
CO2: 25 mmol/L (ref 18–29)
CREATININE: 0.55 mg/dL — AB (ref 0.76–1.27)
GFR calc Af Amer: 126 mL/min/{1.73_m2} (ref >59)
GFR calc non Af Amer: 109 mL/min/{1.73_m2} (ref >59)
Glucose: 106 mg/dL — ABNORMAL HIGH (ref 65–99)
Potassium: 3.8 mmol/L (ref 3.5–5.2)
Sodium: 138 mmol/L (ref 134–144)

## 2016-07-16 MED ORDER — DOXYCYCLINE HYCLATE 100 MG PO TABS: 100.0000 mg | ORAL_TABLET | Freq: Two times a day (BID) | ORAL | 0 refills | Status: DC

## 2016-07-16 MED ORDER — VALSARTAN-HYDROCHLOROTHIAZIDE 320-25 MG PO TABS: 1.0000 | ORAL_TABLET | Freq: Every day | ORAL | 1 refills | Status: DC

## 2016-07-16 NOTE — Patient Instructions (Signed)
I increased your blood pressure medication, sent the new prescription to the pharmacy. (now 320mg  valsartan with 25mg  of HCTZ)  Throw away the old medication.  Start antibiotic.
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
## 2016-07-17 DIAGNOSIS — Z125 Encounter for screening for malignant neoplasm of prostate: Secondary | ICD-10-CM | POA: Diagnosis not present

## 2016-07-18 ENCOUNTER — Other Ambulatory Visit: Payer: Self-pay | Admitting: Nurse Practitioner

## 2016-07-21 NOTE — Telephone Encounter (Signed)
I don't see a testosterone level. Route to pool

## 2016-07-22 ENCOUNTER — Encounter: Payer: Self-pay | Admitting: Pediatrics

## 2016-07-22 ENCOUNTER — Ambulatory Visit (INDEPENDENT_AMBULATORY_CARE_PROVIDER_SITE_OTHER): Payer: PPO | Admitting: Pediatrics

## 2016-07-22 VITALS — BP 134/76 | HR 80 | Temp 97.9°F | Ht 71.0 in | Wt 155.0 lb

## 2016-07-22 DIAGNOSIS — L89153 Pressure ulcer of sacral region, stage 3: Secondary | ICD-10-CM

## 2016-07-22 DIAGNOSIS — G8220 Paraplegia, unspecified: Secondary | ICD-10-CM | POA: Diagnosis not present

## 2016-07-22 DIAGNOSIS — L89523 Pressure ulcer of left ankle, stage 3: Secondary | ICD-10-CM | POA: Diagnosis not present

## 2016-07-22 NOTE — Progress Notes (Signed)
  Subjective:   Patient ID: Dustin Medina, male    DOB: [birth year], 66 y.o.   MRN: [id number] CC: Follow-up (1 week, Ulcers)  HPI: Dustin Medina is a 66 y.o. male presenting for Follow-up (1 week, Ulcers)  Ulcers L ankle, over sacrum Seen last week, L ankle dressed with duoderm, sacrum with minimal discharge at the time Treated with 1 week of doxycycline Pt changed dressing ankle two days ago Sacrum ulcer draining more last few days, L ankle about the same No tenderness, some smell to dressing Has hospital bed at home, says mattress no longer has air in it, thinks may be contributing to sores Cant sleep on his stomach because increases reflux, now trying not to sleep on L side due to ulcer or back because of ulcer Has a gel donut for sacrum that he uses when in recumbent bike   Relevant past medical, surgical, family and social history reviewed. Allergies and medications reviewed and updated. History  Smoking Status  . Current Some Day Smoker  . Packs/day: 0.10  . Types: Cigarettes  Smokeless Tobacco  . Never Used   ROS: Per HPI   Objective:    BP 134/76   Pulse 80   Temp 97.9 F (36.6 C) (Oral)   Ht 5\' 11"  (1.803 m)   Wt 155 lb (70.3 kg) Comment: Patient states,  Unable to stand  BMI 21.62 kg/m   Wt Readings from Last 3 Encounters:  07/22/16 155 lb (70.3 kg)  10/02/13 178 lb 12.7 oz (81.1 kg)  04/08/13 175 lb 4.3 oz (79.5 kg)    Gen: NAD, alert, cooperative with exam, NCAT EYES: EOMI, no conjunctival injection, or no icterus CV: NRRR, normal S1/S2 Resp: CTABL, no wheezes, normal WOB Ext: No edema, warm Neuro: Alert and oriented MSK: normal muscle bulk Skin: 3mmx39mm apprx 61mm depth ulceration L lateral ankle, white granulation tissue present at the edges 4mmx45mm ulceration sacrum, minimally ulcerated moist yellow base  Assessment & Plan:  [name] was seen today for follow-up ulcers  Diagnoses and all orders for this visit:  Decubitus ulcer of left ankle,  stage 3 ([place]) Decubitus ulcer of sacral region, stage 3 ([place]) Dressed with duoderm Has appt next week with wound Avoid pressure to areas  Paraplegic spinal paralysis ([place]) Pt to call home health care re broken hospital bed, if they are not able to send out help to fix let us know  Follow up plan: Return in about 3 days (around 07/25/2016) for dressing change. [name], MD [place]

## 2016-07-23 ENCOUNTER — Ambulatory Visit (INDEPENDENT_AMBULATORY_CARE_PROVIDER_SITE_OTHER): Payer: PPO | Admitting: *Deleted

## 2016-07-23 VITALS — BP 158/89 | HR 84

## 2016-07-23 DIAGNOSIS — Z1211 Encounter for screening for malignant neoplasm of colon: Secondary | ICD-10-CM

## 2016-07-23 DIAGNOSIS — Z0000 Encounter for general adult medical examination without abnormal findings: Secondary | ICD-10-CM

## 2016-07-23 NOTE — Patient Instructions (Addendum)
[name] , Thank you for taking time to come for your Medicare Wellness Visit. I appreciate your ongoing commitment to your health goals. Please review the following plan we discussed and let me know if I can assist you in the future.   These are the goals we discussed:  Goals    . Exercise 3x per week (30 min per time)          Try to exercise for 30 minutes daily. Suggested [employer] Program at the [facility].    . Quit smoking / using tobacco       This is a list of the screening recommended for you and due dates:  Health Maintenance  Topic Date Due  . HIV Screening  06/29/1966  . Colon Cancer Screening  02/18/2015  . DEXA scan (bone density measurement)  07/07/2015  . Pneumonia vaccines (1 of 2 - PCV13) 06/28/2016  . Flu Shot  09/17/2016  . Tetanus Vaccine  04/22/2025  .  Hepatitis C: One time screening is recommended by Center for Disease Control  (CDC) for  adults born from [birth year] through 1964.   Completed    Steps to Quit Smoking Smoking tobacco can be harmful to your health and can affect almost every organ in your body. Smoking puts you, and those around you, at risk for developing many serious chronic diseases. Quitting smoking is difficult, but it is one of the best things that you can do for your health. It is never too late to quit. What are the benefits of quitting smoking? When you quit smoking, you lower your risk of developing serious diseases and conditions, such as:  Lung cancer or lung disease, such as COPD.  Heart disease.  Stroke.  Heart attack.  Infertility.  Osteoporosis and bone fractures.  Additionally, symptoms such as coughing, wheezing, and shortness of breath may get better when you quit. You may also find that you get sick less often because your body is stronger at fighting off colds and infections. If you are pregnant, quitting smoking can help to reduce your chances of having a baby of low birth weight. How do I get ready to quit? When  you decide to quit smoking, create a plan to make sure that you are successful. Before you quit:  Pick a date to quit. Set a date within the next two weeks to give you time to prepare.  Write down the reasons why you are quitting. Keep this list in places where you will see it often, such as on your bathroom mirror or in your car or wallet.  Identify the people, places, things, and activities that make you want to smoke (triggers) and avoid them. Make sure to take these actions: ? Throw away all cigarettes at home, at work, and in your car. ? Throw away smoking accessories, such as Scientist, research (medical). ? Clean your car and make sure to empty the ashtray. ? Clean your home, including curtains and carpets.  Tell your family, friends, and coworkers that you are quitting. Support from your loved ones can make quitting easier.  Talk with your health care provider about your options for quitting smoking.  Find out what treatment options are covered by your health insurance.  What strategies can I use to quit smoking? Talk with your healthcare provider about different strategies to quit smoking. Some strategies include:  Quitting smoking altogether instead of gradually lessening how much you smoke over a period of time. Research shows that quitting "cold  [place]" is more successful than gradually quitting.  Attending in-person counseling to help you build problem-solving skills. You are more likely to have success in quitting if you attend several counseling sessions. Even short sessions of 10 minutes can be effective.  Finding resources and support systems that can help you to quit smoking and remain smoke-free after you quit. These resources are most helpful when you use them often. They can include: ? Online chats with a Social worker. ? Telephone quitlines. ? Careers information officer. ? Support groups or group counseling. ? Text messaging programs. ? Mobile phone applications.  Taking  medicines to help you quit smoking. (If you are pregnant or breastfeeding, talk with your health care provider first.) Some medicines contain nicotine and some do not. Both types of medicines help with cravings, but the medicines that include nicotine help to relieve withdrawal symptoms. Your health care provider may recommend: ? Nicotine patches, gum, or lozenges. ? Nicotine inhalers or sprays. ? Non-nicotine medicine that is taken by mouth.  Talk with your health care provider about combining strategies, such as taking medicines while you are also receiving in-person counseling. Using these two strategies together makes you more likely to succeed in quitting than if you used either strategy on its own. If you are pregnant or breastfeeding, talk with your health care provider about finding counseling or other support strategies to quit smoking. Do not take medicine to help you quit smoking unless told to do so by your health care provider. What things can I do to make it easier to quit? Quitting smoking might feel overwhelming at first, but there is a lot that you can do to make it easier. Take these important actions:  Reach out to your family and friends and ask that they support and encourage you during this time. Call telephone quitlines, reach out to support groups, or work with a counselor for support.  Ask people who smoke to avoid smoking around you.  Avoid places that trigger you to smoke, such as bars, parties, or smoke-break areas at work.  Spend time around people who do not smoke.  Lessen stress in your life, because stress can be a smoking trigger for some people. To lessen stress, try: ? Exercising regularly. ? Deep-breathing exercises. ? Yoga. ? Meditating. ? Performing a body scan. This involves closing your eyes, scanning your body from head to toe, and noticing which parts of your body are particularly tense. Purposefully relax the muscles in those areas.  Download or  purchase mobile phone or tablet apps (applications) that can help you stick to your quit plan by providing reminders, tips, and encouragement. There are many free apps, such as QuitGuide from the [employer] Office manager for Disease Control and Prevention). You can find other support for quitting smoking (smoking cessation) through smokefree.gov and other websites.  How will I feel when I quit smoking? Within the first 24 hours of quitting smoking, you may start to feel some withdrawal symptoms. These symptoms are usually most noticeable 2-3 days after quitting, but they usually do not last beyond 2-3 weeks. Changes or symptoms that you might experience include:  Mood swings.  Restlessness, anxiety, or irritation.  Difficulty concentrating.  Dizziness.  Strong cravings for sugary foods in addition to nicotine.  Mild weight gain.  Constipation.  Nausea.  Coughing or a sore throat.  Changes in how your medicines work in your body.  A depressed mood.  Difficulty sleeping (insomnia).  After the first 2-3 weeks of quitting, you  may start to notice more positive results, such as:  Improved sense of smell and taste.  Decreased coughing and sore throat.  Slower heart rate.  Lower blood pressure.  Clearer skin.  The ability to breathe more easily.  Fewer sick days.  Quitting smoking is very challenging for most people. Do not get discouraged if you are not successful the first time. Some people need to make many attempts to quit before they achieve long-term success. Do your best to stick to your quit plan, and talk with your health care provider if you have any questions or concerns. This information is not intended to replace advice given to you by your health care provider. Make sure you discuss any questions you have with your health care provider. Document Released: 01/28/2001 Document Revised: 10/02/2015 Document Reviewed: 06/20/2014 Elsevier Interactive Patient Education  2017  [employer].
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
## 2016-07-26 ENCOUNTER — Ambulatory Visit (INDEPENDENT_AMBULATORY_CARE_PROVIDER_SITE_OTHER): Payer: PPO | Admitting: Pediatrics

## 2016-07-26 VITALS — BP 136/72 | HR 98 | Temp 98.0°F

## 2016-07-26 DIAGNOSIS — L89153 Pressure ulcer of sacral region, stage 3: Secondary | ICD-10-CM

## 2016-07-26 DIAGNOSIS — L89523 Pressure ulcer of left ankle, stage 3: Secondary | ICD-10-CM | POA: Diagnosis not present

## 2016-07-26 NOTE — Progress Notes (Signed)
  Subjective:   Patient ID: Dustin Medina, male    DOB: 12/22/1950, 66 y.o.   MRN: [id number] CC: Wound Check  HPI: Dustin Medina is a 66 y.o. male presenting for Wound Check  Continues to have drainage from L ankle wound, some from sacral wound Has one more day of antibiotics No fevers, normal appetite Paraplegic, minimal feeling in sacrum, no feeling legs Has not noticed any change in redness  ROS: Per HPI   Objective:    BP 136/72 (BP Location: Left Arm, Patient Position: Sitting, Cuff Size: Normal)   Pulse 98   Temp 98 F (36.7 C) (Oral)   Wt Readings from Last 3 Encounters:  07/22/16 155 lb (70.3 kg)  10/02/13 178 lb 12.7 oz (81.1 kg)  04/08/13 175 lb 4.3 oz (79.5 kg)    Gen: NAD, alert, cooperative with exam, in wheelchair Resp:  normal WOB Neuro: Alert and oriented Skin: L lateral ankle stage III ulceration with yellow discharge, moist yellow base Sacrum with stage II-III ulcer with minimal drainage  Assessment & Plan:  Dustin Medina was seen today for wound check.  Diagnoses and all orders for this visit:  Decubitus ulcer of sacral region, stage 3 (HCC) Daily dressing changes with gauze  Decubitus ulcer of left ankle, stage 3 (HCC) BID dressing changes with gauze for drainage control One more day of abx Wound culture done  Appt at wound clinic this week  Follow up plan: As scheduled [name], MD [place]

## 2016-07-28 DIAGNOSIS — R972 Elevated prostate specific antigen [PSA]: Secondary | ICD-10-CM | POA: Diagnosis not present

## 2016-07-28 DIAGNOSIS — N319 Neuromuscular dysfunction of bladder, unspecified: Secondary | ICD-10-CM | POA: Diagnosis not present

## 2016-07-28 DIAGNOSIS — N390 Urinary tract infection, site not specified: Secondary | ICD-10-CM | POA: Diagnosis not present

## 2016-07-28 DIAGNOSIS — N312 Flaccid neuropathic bladder, not elsewhere classified: Secondary | ICD-10-CM | POA: Diagnosis not present

## 2016-07-28 DIAGNOSIS — R339 Retention of urine, unspecified: Secondary | ICD-10-CM | POA: Diagnosis not present

## 2016-07-29 ENCOUNTER — Ambulatory Visit (HOSPITAL_COMMUNITY): Payer: PPO | Attending: Pediatrics | Admitting: Physical Therapy

## 2016-07-29 DIAGNOSIS — L89524 Pressure ulcer of left ankle, stage 4: Secondary | ICD-10-CM | POA: Diagnosis not present

## 2016-07-29 DIAGNOSIS — L89154 Pressure ulcer of sacral region, stage 4: Secondary | ICD-10-CM | POA: Insufficient documentation
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
## 2016-07-31 ENCOUNTER — Ambulatory Visit (HOSPITAL_COMMUNITY): Payer: PPO

## 2016-07-31 DIAGNOSIS — L89154 Pressure ulcer of sacral region, stage 4: Secondary | ICD-10-CM

## 2016-07-31 DIAGNOSIS — L89524 Pressure ulcer of left ankle, stage 4: Secondary | ICD-10-CM
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
## 2016-08-04 ENCOUNTER — Telehealth: Payer: Self-pay

## 2016-08-04 NOTE — Telephone Encounter (Signed)
Pt received letter from DS to be triaged. He doesn't take blood thinners, no GI problems or hx of heart attack in the past 12 months. [phone]

## 2016-08-05 ENCOUNTER — Ambulatory Visit (HOSPITAL_COMMUNITY): Payer: PPO

## 2016-08-05 DIAGNOSIS — L89154 Pressure ulcer of sacral region, stage 4: Secondary | ICD-10-CM | POA: Diagnosis not present

## 2016-08-05 DIAGNOSIS — L89524 Pressure ulcer of left ankle, stage 4: Secondary | ICD-10-CM

## 2016-08-05 NOTE — Telephone Encounter (Signed)
Forwarding to Ginger who is working on this.

## 2016-08-05 NOTE — Telephone Encounter (Signed)
LMOM to call back
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
## 2016-08-06 NOTE — Telephone Encounter (Signed)
Gastroenterology Pre-Procedure Review  Request Date: Requesting Physician: [place] [facility])  LAST TCS WAS 06/22/10 BY NUR  PATIENT REVIEW QUESTIONS: The patient responded to the following health history questions as indicated:    1. Diabetes Melitis: NO 2. Joint replacements in the past 12 months: NO 3. Major health problems in the past 3 months: Occasional cough, decubitus ulcer on buttocks 4. Has an artificial valve or MVP: NO 5. Has a defibrillator: NO 6. Has been advised in past to take antibiotics in advance of a procedure like teeth cleaning: NO 7. Family history of colon cancer: GRANDFATHER  9. Alcohol Use: NO 9. History of sleep apnea: NO  10. History of coronary artery or other vascular stents placed within the last 12 months: NO    MEDICATIONS & ALLERGIES:    Patient reports the following regarding taking any blood thinners:   Plavix? NO Aspirin? NO Coumadin? NO Brilinta? NO Xarelto? NO Eliquis? NO Pradaxa? NO Savaysa? NO Effient? NO  Patient confirms/reports the following medications:  Current Outpatient Prescriptions  Medication Sig Dispense Refill  . acetaminophen (TYLENOL) 325 MG tablet Take 650 mg by mouth every 6 (six) hours as needed.    . Coenzyme Q10 (CO Q 10) 100 MG CAPS Take by mouth as needed.     . cyclobenzaprine (FLEXERIL) 10 MG tablet TAKE 1 TABLET (10 MG TOTAL) BY MOUTH 3 (THREE) TIMES DAILY AS NEEDED FOR MUSCLE SPASMS. 30 tablet 0  . fluticasone (FLONASE) 50 MCG/ACT nasal spray Place 1 spray into both nostrils 2 (two) times daily as needed for allergies or rhinitis. 16 g 6  . meloxicam (MOBIC) 15 MG tablet TAKE 1 TABLET (15 MG TOTAL) BY MOUTH DAILY AS NEEDED. 90 tablet 0  . Multiple Vitamin (MULITIVITAMIN WITH MINERALS) TABS Take 1 tablet by mouth as needed.     . niacin (NIASPAN) 1000 MG CR tablet TAKE 1 TABLET (1,000 MG TOTAL) BY MOUTH AT BEDTIME. 90 tablet 0  . omeprazole (PRILOSEC) 20 MG capsule TAKE 1 CAPSULE  (20 MG TOTAL) BY MOUTH 2 (TWO) TIMES DAILY BEFORE A MEAL. (Patient taking differently: 20 mg as needed. TAKE 1 CAPSULE (20 MG TOTAL) BY MOUTH 2 (TWO) TIMES DAILY BEFORE A MEAL.) 60 capsule 5  . ondansetron (ZOFRAN ODT) 4 MG disintegrating tablet Take 1 tablet (4 mg total) by mouth every 8 (eight) hours as needed for nausea or vomiting. 20 tablet 0  . promethazine (PHENERGAN) 25 MG tablet Take 1 tablet (25 mg total) by mouth every 8 (eight) hours as needed for nausea or vomiting. 20 tablet 1  . testosterone cypionate (DEPOTESTOSTERONE CYPIONATE) 200 MG/ML injection INJECT 0.75ML INTRAMUSCULARLY EVERY 14 DAYS 10 mL 3  . trimethoprim (TRIMPEX) 100 MG tablet TAKE 1 TABLET EVERY DAY (Patient taking differently: TAKE 1 TABLET EVERY DAY. Takes every other day) 30 tablet 2  . valsartan-hydrochlorothiazide (DIOVAN-HCT) 320-25 MG tablet Take 1 tablet by mouth daily. 90 tablet 1  . doxycycline (VIBRA-TABS) 100 MG tablet Take 100 mg by mouth 2 (two) times daily.  0  . simvastatin (ZOCOR) 40 MG tablet TAKE 1 TABLET (40 MG TOTAL) BY MOUTH AT BEDTIME. NEEDS TO BE SEEN (Patient not taking: Reported on 08/06/2016) 30 tablet 5   No current facility-administered medications for this visit.     Patient confirms/reports the following allergies:  NO  No orders of the defined types were placed in this encounter.   AUTHORIZATION INFORMATION Primary Insurance: [employer],  ID #: [id number],  Group #:  [id number] Pre-Cert / Josem Kaufmann required:  Pre-Cert / Auth #:    SCHEDULE INFORMATION: Procedure has been scheduled as follows:  Date: , Time:   Location:   This Gastroenterology Pre-Precedure Review Form is being routed to the following provider(s): R. Garfield Cornea, MD

## 2016-08-06 NOTE — Telephone Encounter (Signed)
Please ask the patient is he still has an active, ongoing decubitus (wound).

## 2016-08-06 NOTE — Telephone Encounter (Signed)
Spoke to pt. The sacral wound is active, he is having treatments done at [facility]. According to notes in Epic it is Stage 4.

## 2016-08-07 ENCOUNTER — Ambulatory Visit (HOSPITAL_COMMUNITY): Payer: PPO | Admitting: Physical Therapy

## 2016-08-07 DIAGNOSIS — L89209 Pressure ulcer of unspecified hip, unspecified stage: Secondary | ICD-10-CM | POA: Diagnosis not present

## 2016-08-07 DIAGNOSIS — L89154 Pressure ulcer of sacral region, stage 4: Secondary | ICD-10-CM

## 2016-08-07 DIAGNOSIS — L89309 Pressure ulcer of unspecified buttock, unspecified stage: Secondary | ICD-10-CM | POA: Diagnosis not present

## 2016-08-07 DIAGNOSIS — L89524 Pressure ulcer of left ankle, stage 4: Secondary | ICD-10-CM

## 2016-08-07 DIAGNOSIS — L89153 Pressure ulcer of sacral region, stage 3: Secondary | ICD-10-CM | POA: Diagnosis not present

## 2016-08-07 DIAGNOSIS — L8992 Pressure ulcer of unspecified site, stage 2: Secondary | ICD-10-CM | POA: Diagnosis not present

## 2016-08-07 DIAGNOSIS — G8220 Paraplegia, unspecified: Secondary | ICD-10-CM | POA: Diagnosis not present

## 2016-08-07 NOTE — Telephone Encounter (Signed)
I feel it is in the patient's best interest to wait until the sacral wound is healed. Prep with diarrhea could worsen the wound/infection with fecal matter and may be an infection issue with colonoscope.

## 2016-08-07 NOTE — Telephone Encounter (Signed)
Pt called office and is aware. 
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
## 2016-08-12 ENCOUNTER — Ambulatory Visit (HOSPITAL_COMMUNITY): Payer: PPO | Admitting: Physical Therapy

## 2016-08-12 DIAGNOSIS — L89524 Pressure ulcer of left ankle, stage 4: Secondary | ICD-10-CM

## 2016-08-12 DIAGNOSIS — L89154 Pressure ulcer of sacral region, stage 4: Secondary | ICD-10-CM | POA: Diagnosis not present
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
## 2016-08-15 ENCOUNTER — Ambulatory Visit (HOSPITAL_COMMUNITY): Payer: PPO

## 2016-08-15 DIAGNOSIS — L89154 Pressure ulcer of sacral region, stage 4: Secondary | ICD-10-CM | POA: Diagnosis not present

## 2016-08-15 DIAGNOSIS — L89524 Pressure ulcer of left ankle, stage 4: Secondary | ICD-10-CM
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
## 2016-08-19 ENCOUNTER — Ambulatory Visit (INDEPENDENT_AMBULATORY_CARE_PROVIDER_SITE_OTHER): Payer: PPO | Admitting: Nurse Practitioner

## 2016-08-19 ENCOUNTER — Ambulatory Visit (HOSPITAL_COMMUNITY): Payer: PPO | Attending: Nurse Practitioner | Admitting: Physical Therapy

## 2016-08-19 ENCOUNTER — Encounter: Payer: Self-pay | Admitting: Nurse Practitioner

## 2016-08-19 VITALS — BP 139/88 | HR 98 | Temp 97.8°F

## 2016-08-19 DIAGNOSIS — L89524 Pressure ulcer of left ankle, stage 4: Secondary | ICD-10-CM | POA: Diagnosis not present

## 2016-08-19 DIAGNOSIS — L89154 Pressure ulcer of sacral region, stage 4: Secondary | ICD-10-CM | POA: Diagnosis not present

## 2016-08-19 DIAGNOSIS — L89523 Pressure ulcer of left ankle, stage 3: Secondary | ICD-10-CM | POA: Diagnosis not present

## 2016-08-19 MED ORDER — CIPROFLOXACIN HCL 500 MG PO TABS: 500.0000 mg | ORAL_TABLET | Freq: Two times a day (BID) | ORAL | 0 refills | Status: DC
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
## 2016-08-19 NOTE — Patient Instructions (Signed)
How to Change Your Dressing A dressing is a material that is placed in and over wounds. A dressing helps your wound to heal by protecting it from:  Bacteria.  Worse injury.  Being too dry or too wet.  What are the risks? The sticky (adhesive) tape that is used with a dressing may make your skin sore or irritated, or it may cause a rash. These are the most common problems. However, more serious problems can develop, such as:  Bleeding.  Infection.  How to change your dressing Getting Ready to Change Your Dressing   Take a shower before you do the first dressing change of the day. If your doctor does not want your wound to get wet and your dressing is not waterproof, you may need to put plastic leak-proof sealing wrap on your dressing to protect it.  If needed, take pain medicine as told by your doctor 30 minutes before you change your dressing.  Set up a clean station for wound care. You will need: ? A plastic trash bag that is open and ready to use. ? Hand sanitizer. ? Wound cleanser or salt-water solution (saline) as told by your doctor. ? New dressing material or bandages. Make sure to open the dressing package so the dressing stays on the inside of the package. You may also need these supplies in your clean station:  A box of vinyl gloves.  Tape.  Skin protectant. This may be a wipe, film, or spray.  Clean or germ-free (sterile) scissors.  A cotton-tipped applicator.  Taking Off Your Old Dressing  Wash your hands with soap and water. Dry your hands with a clean towel. If you cannot use soap and water, use hand sanitizer.  If you are using gloves, put on the gloves before you take off the dressing.  Gently take off any adhesive or tape by pulling it off in the direction of your hair growth. Only touch the outside edges of the dressing.  Take off the dressing. If the dressing sticks to your skin, wet the dressing with a germ-free salt-water solution. This helps it  come off more easily.  Take off any gauze or packing in your wound.  Throw the old dressing supplies into the ready trash bag.  Take off your gloves. To take off each glove, grab the cuff with your other hand and turn the glove inside out. Put the gloves in the trash right away.  Wash your hands with soap and water. Dry your hands with a clean towel. If you cannot use soap and water, use hand sanitizer. Cleaning Your Wound  Follow instructions from your doctor about how to clean your wound. This may include using a salt-water solution or recommended wound cleanser.  Do not use over-the-counter medicated or antiseptic creams, sprays, liquids, or dressings unless your doctor tells you to do that.  Use a clean gauze pad to clean the area fully with the salt-water solution or wound cleanser that your doctor recommends.  Throw the gauze pad into the trash bag.  Wash your hands with soap and water. Dry your hands with a clean towel. If you cannot use soap and water, use hand sanitizer. Putting on the Dressing  If your doctor recommended a skin protectant, put it on the skin around the wound.  Cover the wound with the recommended dressing, such as a nonstick gauze or bandage. Make sure to touch only the outside edges of the dressing. Do not touch the inside of the dressing.    Attach the dressing so all sides stay in place. You may do this with the attached medical adhesive, roll gauze, or tape. If you use tape, do not wrap the tape all the way around your arm or leg.  Take off your gloves. Put them in the trash bag with the old dressing. Tie the bag shut and throw it away.  Wash your hands with soap and water. Dry your hands with a clean towel. If you cannot use soap and water, use hand sanitizer. Get help if:   You have new pain.  You have irritation, a rash, or itching around the wound or dressing.  Changing your dressing is painful.  Changing your dressing causes a lot of  bleeding. Get help right away if:  You have very bad pain.  You have signs of infection, such as: ? More redness, swelling, or pain. ? More fluid or blood. ? Warmth. ? Pus or a bad smell. ? Red streaks leading from wound. ? A fever. This information is not intended to replace advice given to you by your health care provider. Make sure you discuss any questions you have with your health care provider. Document Released: 05/02/2008 Document Revised: 07/12/2015 Document Reviewed: 11/09/2014 Elsevier Interactive Patient Education  2018 [employer] Inc[employer]  

## 2016-08-19 NOTE — Progress Notes (Signed)
   Subjective:    Patient ID: Dustin Medina, male    DOB: [birth year], 66 y.o.   MRN: [id number]  HPI  Patient comes in today for follow up of ulcer on left lateral ankle and buttocks. He saw Dr. Evette Doffing 07/26/16 and was started on antibiotic and was sent to wound care. Patient waited a couple of weeks before going  to wound care. He went for 3 weeks and now says that he has to be seen by PCP in order to go back.   Review of Systems  Constitutional: Negative.   HENT: Negative.   Respiratory: Negative.   Cardiovascular: Negative.   Gastrointestinal: Negative.   Skin: Positive for wound.  Neurological: Negative.   Psychiatric/Behavioral: Negative.   All other systems reviewed and are negative.      Objective:   Physical Exam  Constitutional: He appears well-developed and well-nourished. No distress.  Cardiovascular: Normal rate and regular rhythm.   Pulmonary/Chest: Effort normal and breath sounds normal.  Skin: Skin is warm.  32cm by 2cm deep annular ulceration with greenish drainage left lateral ankle.   35x 1cm deep annualar ulceration sacrum also with greenish excudate.  Psychiatric: He has a normal mood and affect. His behavior is normal. Judgment and thought content normal.   BP 139/88   Pulse 98   Temp 97.8 F (36.6 C) (Oral)    Dressing change done by nurse     Assessment & Plan:   1. Decubitus ulcer of left ankle, stage 3 (HCC)    Meds ordered this encounter  Medications  . ciprofloxacin (CIPRO) 500 MG tablet    Sig: Take 1 tablet (500 mg total) by mouth 2 (two) times daily.    Dispense:  20 tablet    Refill:  0    Order Specific Question:   Supervising Provider    Answer:   Evette Doffing, CAROL L [[id number]]   Orders Placed This Encounter  Procedures  . AMB referral to wound care center    Referral Priority:   Urgent    Referral Type:   Consultation    Number of Visits Requested:   1   Keep wounds clean and dry Wound care as soon as possible RTO prn * Patient  needs new wheel chair [place], [place]

## 2016-08-21 ENCOUNTER — Ambulatory Visit (HOSPITAL_COMMUNITY): Payer: PPO | Admitting: Physical Therapy

## 2016-08-21 DIAGNOSIS — L89154 Pressure ulcer of sacral region, stage 4: Secondary | ICD-10-CM

## 2016-08-21 DIAGNOSIS — L89524 Pressure ulcer of left ankle, stage 4: Secondary | ICD-10-CM
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
## 2016-08-26 ENCOUNTER — Ambulatory Visit (HOSPITAL_COMMUNITY): Payer: PPO

## 2016-08-26 DIAGNOSIS — L89154 Pressure ulcer of sacral region, stage 4: Secondary | ICD-10-CM

## 2016-08-26 DIAGNOSIS — L89524 Pressure ulcer of left ankle, stage 4: Secondary | ICD-10-CM
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
## 2016-08-28 ENCOUNTER — Ambulatory Visit (HOSPITAL_COMMUNITY): Payer: PPO

## 2016-08-28 DIAGNOSIS — L89524 Pressure ulcer of left ankle, stage 4: Secondary | ICD-10-CM

## 2016-08-28 DIAGNOSIS — L89154 Pressure ulcer of sacral region, stage 4: Secondary | ICD-10-CM
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
## 2016-09-02 ENCOUNTER — Ambulatory Visit (HOSPITAL_COMMUNITY): Payer: PPO

## 2016-09-02 DIAGNOSIS — L89154 Pressure ulcer of sacral region, stage 4: Secondary | ICD-10-CM

## 2016-09-02 DIAGNOSIS — L89524 Pressure ulcer of left ankle, stage 4: Secondary | ICD-10-CM
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
## 2016-09-04 ENCOUNTER — Ambulatory Visit (HOSPITAL_COMMUNITY): Payer: PPO | Admitting: Physical Therapy

## 2016-09-04 DIAGNOSIS — L89154 Pressure ulcer of sacral region, stage 4: Secondary | ICD-10-CM | POA: Diagnosis not present

## 2016-09-04 DIAGNOSIS — L89524 Pressure ulcer of left ankle, stage 4: Secondary | ICD-10-CM
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
## 2016-09-09 ENCOUNTER — Ambulatory Visit (HOSPITAL_COMMUNITY): Payer: PPO

## 2016-09-09 DIAGNOSIS — L89154 Pressure ulcer of sacral region, stage 4: Secondary | ICD-10-CM

## 2016-09-09 DIAGNOSIS — L89524 Pressure ulcer of left ankle, stage 4: Secondary | ICD-10-CM
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
## 2016-09-11 ENCOUNTER — Ambulatory Visit (HOSPITAL_COMMUNITY): Payer: PPO | Admitting: Physical Therapy

## 2016-09-11 DIAGNOSIS — L89154 Pressure ulcer of sacral region, stage 4: Secondary | ICD-10-CM

## 2016-09-11 DIAGNOSIS — N390 Urinary tract infection, site not specified: Secondary | ICD-10-CM | POA: Diagnosis not present

## 2016-09-11 DIAGNOSIS — R339 Retention of urine, unspecified: Secondary | ICD-10-CM | POA: Diagnosis not present

## 2016-09-11 DIAGNOSIS — L89524 Pressure ulcer of left ankle, stage 4: Secondary | ICD-10-CM

## 2016-09-11 DIAGNOSIS — N319 Neuromuscular dysfunction of bladder, unspecified: Secondary | ICD-10-CM | POA: Diagnosis not present
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
## 2016-09-16 ENCOUNTER — Ambulatory Visit (HOSPITAL_COMMUNITY): Payer: PPO | Admitting: Physical Therapy

## 2016-09-16 DIAGNOSIS — L89524 Pressure ulcer of left ankle, stage 4: Secondary | ICD-10-CM

## 2016-09-16 DIAGNOSIS — L89154 Pressure ulcer of sacral region, stage 4: Secondary | ICD-10-CM | POA: Diagnosis not present
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
## 2016-09-18 ENCOUNTER — Ambulatory Visit (HOSPITAL_COMMUNITY): Payer: PPO | Attending: Nurse Practitioner | Admitting: Physical Therapy

## 2016-09-18 DIAGNOSIS — L89154 Pressure ulcer of sacral region, stage 4: Secondary | ICD-10-CM | POA: Diagnosis not present

## 2016-09-18 DIAGNOSIS — L89524 Pressure ulcer of left ankle, stage 4: Secondary | ICD-10-CM | POA: Diagnosis not present
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
## 2016-09-23 ENCOUNTER — Ambulatory Visit (HOSPITAL_COMMUNITY): Payer: PPO | Admitting: Physical Therapy

## 2016-09-23 DIAGNOSIS — L89154 Pressure ulcer of sacral region, stage 4: Secondary | ICD-10-CM

## 2016-09-23 DIAGNOSIS — L89524 Pressure ulcer of left ankle, stage 4: Secondary | ICD-10-CM
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
## 2016-09-25 ENCOUNTER — Ambulatory Visit (HOSPITAL_COMMUNITY): Payer: PPO | Admitting: Physical Therapy

## 2016-09-25 DIAGNOSIS — L89524 Pressure ulcer of left ankle, stage 4: Secondary | ICD-10-CM

## 2016-09-25 DIAGNOSIS — L89154 Pressure ulcer of sacral region, stage 4: Secondary | ICD-10-CM | POA: Diagnosis not present
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
## 2016-09-30 ENCOUNTER — Ambulatory Visit (HOSPITAL_COMMUNITY): Payer: PPO | Admitting: Physical Therapy

## 2016-09-30 DIAGNOSIS — L89154 Pressure ulcer of sacral region, stage 4: Secondary | ICD-10-CM

## 2016-09-30 DIAGNOSIS — L89524 Pressure ulcer of left ankle, stage 4: Secondary | ICD-10-CM
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
## 2016-10-01 ENCOUNTER — Other Ambulatory Visit: Payer: Self-pay | Admitting: Nurse Practitioner

## 2016-10-01 NOTE — Telephone Encounter (Signed)
Last refill without being seen 

## 2016-10-01 NOTE — Telephone Encounter (Signed)
Patient was seen for a wellness visit in June 2016.

## 2016-10-02 ENCOUNTER — Ambulatory Visit (HOSPITAL_COMMUNITY): Payer: PPO | Admitting: Physical Therapy

## 2016-10-02 DIAGNOSIS — L89154 Pressure ulcer of sacral region, stage 4: Secondary | ICD-10-CM | POA: Diagnosis not present

## 2016-10-02 DIAGNOSIS — L89524 Pressure ulcer of left ankle, stage 4: Secondary | ICD-10-CM
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
## 2016-10-04 ENCOUNTER — Other Ambulatory Visit: Payer: Self-pay | Admitting: Family

## 2016-10-07 ENCOUNTER — Ambulatory Visit (HOSPITAL_COMMUNITY): Payer: PPO | Admitting: Physical Therapy

## 2016-10-07 DIAGNOSIS — L89154 Pressure ulcer of sacral region, stage 4: Secondary | ICD-10-CM | POA: Diagnosis not present

## 2016-10-07 DIAGNOSIS — L89524 Pressure ulcer of left ankle, stage 4: Secondary | ICD-10-CM
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
## 2016-10-09 ENCOUNTER — Ambulatory Visit (HOSPITAL_COMMUNITY): Payer: PPO | Admitting: Physical Therapy

## 2016-10-09 DIAGNOSIS — L89524 Pressure ulcer of left ankle, stage 4: Secondary | ICD-10-CM

## 2016-10-09 DIAGNOSIS — L89154 Pressure ulcer of sacral region, stage 4: Secondary | ICD-10-CM
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
## 2016-10-14 ENCOUNTER — Ambulatory Visit (HOSPITAL_COMMUNITY): Payer: PPO | Admitting: Physical Therapy

## 2016-10-14 DIAGNOSIS — L89154 Pressure ulcer of sacral region, stage 4: Secondary | ICD-10-CM

## 2016-10-14 DIAGNOSIS — L89524 Pressure ulcer of left ankle, stage 4: Secondary | ICD-10-CM
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
## 2016-10-16 ENCOUNTER — Ambulatory Visit (HOSPITAL_COMMUNITY): Payer: PPO | Admitting: Physical Therapy

## 2016-10-16 DIAGNOSIS — L89154 Pressure ulcer of sacral region, stage 4: Secondary | ICD-10-CM | POA: Diagnosis not present

## 2016-10-16 DIAGNOSIS — L89524 Pressure ulcer of left ankle, stage 4: Secondary | ICD-10-CM
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
## 2016-10-21 ENCOUNTER — Ambulatory Visit (HOSPITAL_COMMUNITY): Payer: PPO | Attending: Nurse Practitioner

## 2016-10-21 DIAGNOSIS — L89154 Pressure ulcer of sacral region, stage 4: Secondary | ICD-10-CM | POA: Diagnosis not present

## 2016-10-21 DIAGNOSIS — L89524 Pressure ulcer of left ankle, stage 4: Secondary | ICD-10-CM | POA: Diagnosis not present
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
## 2016-10-23 ENCOUNTER — Ambulatory Visit (HOSPITAL_COMMUNITY): Payer: PPO | Admitting: Physical Therapy

## 2016-10-23 DIAGNOSIS — L89154 Pressure ulcer of sacral region, stage 4: Secondary | ICD-10-CM | POA: Diagnosis not present

## 2016-10-23 DIAGNOSIS — L89524 Pressure ulcer of left ankle, stage 4: Secondary | ICD-10-CM
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
## 2016-10-28 ENCOUNTER — Telehealth: Payer: Self-pay | Admitting: Family

## 2016-10-28 ENCOUNTER — Ambulatory Visit (HOSPITAL_COMMUNITY): Payer: PPO | Admitting: Physical Therapy

## 2016-10-28 DIAGNOSIS — L89524 Pressure ulcer of left ankle, stage 4: Secondary | ICD-10-CM

## 2016-10-28 DIAGNOSIS — R339 Retention of urine, unspecified: Secondary | ICD-10-CM | POA: Diagnosis not present

## 2016-10-28 DIAGNOSIS — L89154 Pressure ulcer of sacral region, stage 4: Secondary | ICD-10-CM | POA: Diagnosis not present

## 2016-10-28 DIAGNOSIS — N390 Urinary tract infection, site not specified: Secondary | ICD-10-CM | POA: Diagnosis not present

## 2016-10-28 DIAGNOSIS — N319 Neuromuscular dysfunction of bladder, unspecified: Secondary | ICD-10-CM | POA: Diagnosis not present
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
## 2016-10-29 MED ORDER — NIACIN ER (ANTIHYPERLIPIDEMIC) 1000 MG PO TBCR: EXTENDED_RELEASE_TABLET | ORAL | 0 refills | Status: DC

## 2016-10-29 NOTE — Telephone Encounter (Signed)
niaspan rx sent to pharmacy

## 2016-10-30 ENCOUNTER — Ambulatory Visit (HOSPITAL_COMMUNITY): Payer: PPO | Admitting: Physical Therapy

## 2016-10-30 ENCOUNTER — Encounter (HOSPITAL_COMMUNITY): Payer: Self-pay | Admitting: Physical Therapy

## 2016-10-30 DIAGNOSIS — L89524 Pressure ulcer of left ankle, stage 4: Secondary | ICD-10-CM

## 2016-10-30 DIAGNOSIS — L89154 Pressure ulcer of sacral region, stage 4: Secondary | ICD-10-CM | POA: Diagnosis not present
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
## 2016-11-04 ENCOUNTER — Ambulatory Visit (HOSPITAL_COMMUNITY): Payer: PPO | Admitting: Physical Therapy

## 2016-11-04 ENCOUNTER — Encounter (HOSPITAL_COMMUNITY): Payer: Self-pay | Admitting: Physical Therapy

## 2016-11-04 DIAGNOSIS — L89154 Pressure ulcer of sacral region, stage 4: Secondary | ICD-10-CM

## 2016-11-04 DIAGNOSIS — L89524 Pressure ulcer of left ankle, stage 4: Secondary | ICD-10-CM
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
## 2016-11-07 ENCOUNTER — Ambulatory Visit (HOSPITAL_COMMUNITY): Payer: PPO | Admitting: Physical Therapy

## 2016-11-07 DIAGNOSIS — L89154 Pressure ulcer of sacral region, stage 4: Secondary | ICD-10-CM | POA: Diagnosis not present

## 2016-11-07 DIAGNOSIS — L89524 Pressure ulcer of left ankle, stage 4: Secondary | ICD-10-CM
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
## 2016-11-11 ENCOUNTER — Ambulatory Visit (HOSPITAL_COMMUNITY): Payer: PPO | Admitting: Physical Therapy

## 2016-11-11 DIAGNOSIS — L89154 Pressure ulcer of sacral region, stage 4: Secondary | ICD-10-CM

## 2016-11-11 DIAGNOSIS — L89524 Pressure ulcer of left ankle, stage 4: Secondary | ICD-10-CM
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
## 2016-11-13 ENCOUNTER — Ambulatory Visit (HOSPITAL_COMMUNITY): Payer: PPO

## 2016-11-13 ENCOUNTER — Encounter: Payer: Self-pay | Admitting: Family Medicine

## 2016-11-13 ENCOUNTER — Encounter (HOSPITAL_COMMUNITY): Payer: Self-pay

## 2016-11-13 ENCOUNTER — Ambulatory Visit (INDEPENDENT_AMBULATORY_CARE_PROVIDER_SITE_OTHER): Payer: PPO | Admitting: Family Medicine

## 2016-11-13 VITALS — BP 160/90 | HR 85 | Temp 98.7°F

## 2016-11-13 DIAGNOSIS — L89524 Pressure ulcer of left ankle, stage 4: Secondary | ICD-10-CM

## 2016-11-13 DIAGNOSIS — R300 Dysuria: Secondary | ICD-10-CM | POA: Diagnosis not present

## 2016-11-13 DIAGNOSIS — N3000 Acute cystitis without hematuria: Secondary | ICD-10-CM | POA: Diagnosis not present

## 2016-11-13 DIAGNOSIS — L89154 Pressure ulcer of sacral region, stage 4: Secondary | ICD-10-CM | POA: Diagnosis not present

## 2016-11-13 LAB — MICROSCOPIC EXAMINATION
RBC MICROSCOPIC, UA: NONE SEEN /HPF (ref 0–?)
RENAL EPITHEL UA: NONE SEEN /HPF

## 2016-11-13 LAB — URINALYSIS, COMPLETE
Bilirubin, UA: NEGATIVE
GLUCOSE, UA: NEGATIVE
Ketones, UA: NEGATIVE
NITRITE UA: NEGATIVE
PH UA: 8.5 — AB (ref 5.0–7.5)
RBC, UA: NEGATIVE
Specific Gravity, UA: 1.02 (ref 1.005–1.030)
Urobilinogen, Ur: 1 mg/dL (ref 0.2–1.0)

## 2016-11-13 MED ORDER — AMOXICILLIN-POT CLAVULANATE 875-125 MG PO TABS: 1.0000 | ORAL_TABLET | Freq: Two times a day (BID) | ORAL | 0 refills | Status: DC

## 2016-11-13 NOTE — Progress Notes (Signed)
   HPI  Patient presents today here with symptoms of UTI.  Patient states he's had one week of dysuria, fatigue malaise, grogginess, foul smelling urine, and cloudy appearing urine. He has to self catheterize due to neurogenic bladder  He denies fever, CVA area pain, or new abdominal pain.  He does work with wound care for a sacral and left heel ulcer.  Patient is in very good spirits, over the weekend he won a beard contest at [employer].  PMH: Smoking status noted ROS: Per HPI  Objective: BP (!) 160/90   Pulse 85   Temp 98.7 F (37.1 C) (Oral)  Gen: NAD, alert, cooperative with exam HEENT: NCAT CV: RRR, good S1/S2, no murmur Resp: CTABL, no wheezes, non-labored Abd:  Soft, mild tenderness to palpation in the suprapubic area, no CVA tenderness Ext: No edema, warm Neuro: Alert and oriented, No gross deficits  Assessment and plan:  # UTI Treat with Augmentin based on his previous cultures and recent antibiotic use Culture Patient appears well without systemic signs, however and considering this applicator given male, neurogenic bladder, frequent catheterization.     Orders Placed This Encounter  Procedures  . Urine Culture  . Urinalysis, Complete    Meds ordered this encounter  Medications  . amoxicillin-clavulanate (AUGMENTIN) 875-125 MG tablet    Sig: Take 1 tablet by mouth 2 (two) times daily.    Dispense:  20 tablet    Refill:  0    Laroy Apple, MD [facility] 11/13/2016, 12:16 PM
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
## 2016-11-13 NOTE — Patient Instructions (Signed)
Great to meet you!   Urinary Tract Infection, Adult A urinary tract infection (UTI) is an infection of any part of the urinary tract, which includes the kidneys, ureters, bladder, and urethra. These organs make, store, and get rid of urine in the body. UTI can be a bladder infection (cystitis) or kidney infection (pyelonephritis). What are the causes? This infection may be caused by fungi, viruses, or bacteria. Bacteria are the most common cause of UTIs. This condition can also be caused by repeated incomplete emptying of the bladder during urination. What increases the risk? This condition is more likely to develop if:  You ignore your need to urinate or hold urine for long periods of time.  You do not empty your bladder completely during urination.  You wipe back to front after urinating or having a bowel movement, if you are male.  You are uncircumcised, if you are male.  You are constipated.  You have a urinary catheter that stays in place (indwelling).  You have a weak defense (immune) system.  You have a medical condition that affects your bowels, kidneys, or bladder.  You have diabetes.  You take antibiotic medicines frequently or for long periods of time, and the antibiotics no longer work well against certain types of infections (antibiotic resistance).  You take medicines that irritate your urinary tract.  You are exposed to chemicals that irritate your urinary tract.  You are male. What are the signs or symptoms? Symptoms of this condition include:  Fever.  Frequent urination or passing small amounts of urine frequently.  Needing to urinate urgently.  Pain or burning with urination.  Urine that smells bad or unusual.  Cloudy urine.  Pain in the lower abdomen or back.  Trouble urinating.  Blood in the urine.  Vomiting or being less hungry than normal.  Diarrhea or abdominal pain.  Vaginal discharge, if you are male. How is this  diagnosed? This condition is diagnosed with a medical history and physical exam. You will also need to provide a urine sample to test your urine. Other tests may be done, including:  Blood tests.  Sexually transmitted disease (STD) testing. If you have had more than one UTI, a cystoscopy or imaging studies may be done to determine the cause of the infections. How is this treated? Treatment for this condition often includes a combination of two or more of the following:  Antibiotic medicine.  Other medicines to treat less common causes of UTI.  Over-the-counter medicines to treat pain.  Drinking enough water to stay hydrated. Follow these instructions at home:  Take over-the-counter and prescription medicines only as told by your health care provider.  If you were prescribed an antibiotic, take it as told by your health care provider. Do not stop taking the antibiotic even if you start to feel better.  Avoid alcohol, caffeine, tea, and carbonated beverages. They can irritate your bladder.  Drink enough fluid to keep your urine clear or pale yellow.  Keep all follow-up visits as told by your health care provider. This is important.  Make sure to:  Empty your bladder often and completely. Do not hold urine for long periods of time.  Empty your bladder before and after sex.  Wipe from front to back after a bowel movement if you are male. Use each tissue one time when you wipe. Contact a health care provider if:  You have back pain.  You have a fever.  You feel nauseous or vomit.  Your symptoms   vomit.  Your symptoms do not get better after 3 days.  Your symptoms go away and then return. Get help right away if:  You have severe back pain or lower abdominal pain.  You are vomiting and cannot keep down any medicines or water. This information is not intended to replace advice given to you by your health care provider. Make sure you discuss any questions you have with your health care  provider. Document Released: 11/13/2004 Document Revised: 07/18/2015 Document Reviewed: 12/25/2014 Elsevier Interactive Patient Education  2017 [employer].

## 2016-11-15 LAB — URINE CULTURE

## 2016-11-17 ENCOUNTER — Telehealth (HOSPITAL_COMMUNITY): Payer: Self-pay | Admitting: Nurse Practitioner

## 2016-11-17 NOTE — Telephone Encounter (Signed)
11/17/16  taking a lady to [facility] who is having surgery and wanted a later time... I added to wait list in case something comes available

## 2016-11-18 ENCOUNTER — Ambulatory Visit (HOSPITAL_COMMUNITY): Payer: PPO | Admitting: Physical Therapy

## 2016-11-20 ENCOUNTER — Ambulatory Visit (HOSPITAL_COMMUNITY): Payer: PPO | Attending: Nurse Practitioner | Admitting: Physical Therapy

## 2016-11-20 DIAGNOSIS — L89154 Pressure ulcer of sacral region, stage 4: Secondary | ICD-10-CM | POA: Insufficient documentation

## 2016-11-20 DIAGNOSIS — L89524 Pressure ulcer of left ankle, stage 4: Secondary | ICD-10-CM | POA: Diagnosis not present
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
## 2016-11-25 ENCOUNTER — Ambulatory Visit (HOSPITAL_COMMUNITY): Payer: PPO | Admitting: Physical Therapy

## 2016-11-25 DIAGNOSIS — L89524 Pressure ulcer of left ankle, stage 4: Secondary | ICD-10-CM | POA: Diagnosis not present

## 2016-11-25 DIAGNOSIS — L89154 Pressure ulcer of sacral region, stage 4: Secondary | ICD-10-CM
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
## 2016-11-26 DIAGNOSIS — H2513 Age-related nuclear cataract, bilateral: Secondary | ICD-10-CM | POA: Diagnosis not present

## 2016-11-26 DIAGNOSIS — H40033 Anatomical narrow angle, bilateral: Secondary | ICD-10-CM | POA: Diagnosis not present

## 2016-11-27 ENCOUNTER — Encounter (HOSPITAL_COMMUNITY): Payer: Self-pay

## 2016-11-27 ENCOUNTER — Ambulatory Visit (HOSPITAL_COMMUNITY): Payer: PPO

## 2016-11-27 DIAGNOSIS — L89524 Pressure ulcer of left ankle, stage 4: Secondary | ICD-10-CM | POA: Diagnosis not present

## 2016-11-27 DIAGNOSIS — L89154 Pressure ulcer of sacral region, stage 4: Secondary | ICD-10-CM
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
## 2016-12-01 DIAGNOSIS — R339 Retention of urine, unspecified: Secondary | ICD-10-CM | POA: Diagnosis not present

## 2016-12-01 DIAGNOSIS — N319 Neuromuscular dysfunction of bladder, unspecified: Secondary | ICD-10-CM | POA: Diagnosis not present

## 2016-12-01 DIAGNOSIS — N390 Urinary tract infection, site not specified: Secondary | ICD-10-CM | POA: Diagnosis not present

## 2016-12-02 ENCOUNTER — Ambulatory Visit (HOSPITAL_COMMUNITY): Payer: PPO

## 2016-12-02 DIAGNOSIS — L89524 Pressure ulcer of left ankle, stage 4: Secondary | ICD-10-CM | POA: Diagnosis not present

## 2016-12-02 DIAGNOSIS — L89154 Pressure ulcer of sacral region, stage 4: Secondary | ICD-10-CM
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
## 2016-12-04 ENCOUNTER — Encounter (HOSPITAL_COMMUNITY): Payer: Self-pay

## 2016-12-04 ENCOUNTER — Ambulatory Visit (HOSPITAL_COMMUNITY): Payer: PPO

## 2016-12-04 DIAGNOSIS — L89154 Pressure ulcer of sacral region, stage 4: Secondary | ICD-10-CM

## 2016-12-04 DIAGNOSIS — L89524 Pressure ulcer of left ankle, stage 4: Secondary | ICD-10-CM
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
## 2016-12-06 ENCOUNTER — Telehealth: Payer: Self-pay | Admitting: Nurse Practitioner

## 2016-12-08 ENCOUNTER — Other Ambulatory Visit: Payer: Self-pay | Admitting: *Deleted

## 2016-12-08 DIAGNOSIS — R309 Painful micturition, unspecified: Secondary | ICD-10-CM

## 2016-12-08 NOTE — Telephone Encounter (Signed)
Aware and labs ordered.

## 2016-12-08 NOTE — Telephone Encounter (Signed)
Patient says he still has burning feeling when urinating.  Should he have more medications?

## 2016-12-08 NOTE — Telephone Encounter (Signed)
Will request repeat ua and culture.   Laroy Apple, MD [facility] 12/08/2016, 11:59 AM

## 2016-12-09 ENCOUNTER — Telehealth: Payer: Self-pay | Admitting: Internal Medicine

## 2016-12-09 ENCOUNTER — Ambulatory Visit (HOSPITAL_COMMUNITY): Payer: PPO | Admitting: Physical Therapy

## 2016-12-09 DIAGNOSIS — L89524 Pressure ulcer of left ankle, stage 4: Secondary | ICD-10-CM | POA: Diagnosis not present

## 2016-12-09 DIAGNOSIS — L89154 Pressure ulcer of sacral region, stage 4: Secondary | ICD-10-CM

## 2016-12-09 NOTE — Telephone Encounter (Signed)
Dustin Medina, this pt called concerning a non healing decubitus ulcer near his anus. Pt is being treated by [facility] and the notes are scanned in pts chart. should pt schedule an appointment with our office? Please advise.
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
## 2016-12-09 NOTE — Telephone Encounter (Signed)
[phone]  PATIENT CALLED WITH QUESTIONS ABOUT HIS HEALING

## 2016-12-10 NOTE — Telephone Encounter (Signed)
We don't deal with the ulcer. I think he is due for routine colonoscopy, but we would want this healed first.

## 2016-12-10 NOTE — Telephone Encounter (Signed)
Pt notified to contact his PCP to discuss treatment and evaluation.

## 2016-12-11 ENCOUNTER — Ambulatory Visit (HOSPITAL_COMMUNITY): Payer: PPO | Admitting: Physical Therapy

## 2016-12-11 ENCOUNTER — Encounter: Payer: Self-pay | Admitting: Nurse Practitioner

## 2016-12-11 ENCOUNTER — Ambulatory Visit (INDEPENDENT_AMBULATORY_CARE_PROVIDER_SITE_OTHER): Payer: PPO | Admitting: Nurse Practitioner

## 2016-12-11 VITALS — BP 162/97 | HR 91 | Temp 98.0°F

## 2016-12-11 DIAGNOSIS — R309 Painful micturition, unspecified: Secondary | ICD-10-CM

## 2016-12-11 DIAGNOSIS — L89152 Pressure ulcer of sacral region, stage 2: Secondary | ICD-10-CM

## 2016-12-11 DIAGNOSIS — L89524 Pressure ulcer of left ankle, stage 4: Secondary | ICD-10-CM | POA: Diagnosis not present

## 2016-12-11 DIAGNOSIS — N3000 Acute cystitis without hematuria: Secondary | ICD-10-CM

## 2016-12-11 LAB — URINALYSIS, COMPLETE
Bilirubin, UA: NEGATIVE
Glucose, UA: NEGATIVE
Ketones, UA: NEGATIVE
NITRITE UA: POSITIVE — AB
Specific Gravity, UA: >1.03 — ABNORMAL HIGH (ref 1.005–1.030)
Urobilinogen, Ur: 1 mg/dL (ref 0.2–1.0)
pH, UA: 6.5 (ref 5.0–7.5)

## 2016-12-11 LAB — MICROSCOPIC EXAMINATION
Renal Epithel, UA: NONE SEEN /hpf
WBC, UA: >30 /hpf — AB (ref 0–?)

## 2016-12-11 MED ORDER — NITROFURANTOIN MONOHYD MACRO 100 MG PO CAPS: 100.0000 mg | ORAL_CAPSULE | Freq: Two times a day (BID) | ORAL | 0 refills | Status: DC

## 2016-12-11 NOTE — Patient Instructions (Signed)
Pressure Injury A pressure injury, sometimes called a bedsore, is an injury to the skin and underlying tissue caused by pressure. Pressure on blood vessels causes decreased blood flow to the skin, which can eventually cause the skin tissue to die and break down into a wound. Pressure injuries usually occur:  Over bony parts of the body such as the tailbone, shoulders, elbows, hips, and heels.  Under medical devices such as respiratory equipment, stockings, tubes, and splints.  Pressure injuries start as reddened areas on the skin and can lead to pain, muscle damage, and infection. Pressure injuries can vary in severity. What are the causes? Pressure injuries are caused by a lack of blood supply to an area of skin. They can occur from intense pressure over a short period of time or from less intense pressure over a long period of time. What increases the risk? This condition is more likely to develop in people who:  Are in the hospital or an extended care facility.  Are bedridden or in a wheelchair.  Have an injury or disease that keeps them from: ? Moving normally. ? Feeling pain or pressure.  Have a condition that: ? Makes them sleepy or less alert. ? Causes poor blood flow.  Need to wear a medical device.  Have poor control of their bladder or bowel functions (incontinence).  Have poor nutrition (malnutrition).  Are of certain ethnicities. People of African American and Latino or Hispanic descent are at higher risk compared to other ethnic groups.  If you are at risk for pressure ulcers, your health care provider may recommend certain types of bedding to help prevent them. These may include foam or gel mattresses covered with one of the following:  A sheepskin blanket.  A pad that is filled with gel, air, water, or foam.  What are the signs or symptoms? The main symptom is a blister or change in skin color that opens into a wound. Other symptoms include:  Red or dark  areas of skin that do not turn white or pale when pressed with a finger.  Pain, warmth, or change of skin texture.  How is this diagnosed? This condition is diagnosed with a medical history and physical exam. You may also have tests, including:  Blood tests to check for infection or signs of poor nutrition.  Imaging studies to check for damage to the deep tissues under your skin.  Blood flow studies.  Your pressure injury will be staged to determine its severity. Staging is an assessment of:  The depth of the pressure injury.  Which tissues are exposed because of the pressure injury.  The causes of the pressure injury.  How is this treated? The main focus of treatment is to help your injury heal. This may be done by:  Relieving or redistributing pressure on your skin. This includes: ? Frequently changing your position. ? Eliminating or minimizing positions that caused the wound or that can make the wound worse. ? Using specific bed mattresses and chair cushions. ? Refitting, resizing, or replacing any medical devices, or padding the skin under them. ? Using creams or powders to prevent rubbing (friction) on the skin.  Keeping your skin clean and dry. This may include using a skin cleanser or skin protectant as told by your health care provider. This may be a lotion, ointment, or spray.  Cleaning your injury and removing any dead tissue from the wound (debridement).  Placing a bandage (dressing) over your injury.  Preventing or treating infection.  This may include antibiotic, antimicrobial, or antiseptic medicines.  Treatment may also include medicine for pain. Sometimes surgery is needed to close the wound with a flap of healthy skin or a piece of skin from another area of your body (graft). You may need surgery if other treatments are not working or if your injury is very deep. Follow these instructions at home: Wound care  Follow instructions from your health care  provider about: ? How to take care of your wound. ? When and how you should change your dressing. ? When you should remove your dressing. If your dressing is dry and stuck when you try to remove it, moisten or wet the dressing with saline or water so that it can be removed without harming your skin or wound tissue.  Check your wound every day for signs of infection. Have a caregiver do this for you if you are not able. Watch for: ? More redness, swelling, or pain. ? More fluid, blood, or pus. ? A bad smell. Skin Care  Keep your skin clean and dry. Gently pat your skin dry.  Do not rub or massage your skin.  Use a skin protectant only as told by your health care provider.  Check your skin every day for any changes in color or any new blisters or sores (ulcers). Have a caregiver do this for you if you are not able. Medicines  Take over-the-counter and prescription medicines only as told by your health care provider.  If you were prescribed an antibiotic medicine, take it or apply it as told by your health care provider. Do not stop taking or using the antibiotic even if your condition improves. Reducing and Redistributing Pressure  Do not lie or sit in one position for a long time. Move or change position every two hours or as told by your health care provider.  Use pillows or cushions to reduce pressure. Ask your health care provider to recommend cushions or pads for you.  Use medical devices that do not rub your skin. Tell your health care provider if one of your medical devices is causing a pressure injury to develop. General instructions   Eat a healthy diet that includes lots of protein. Ask your health care provider for diet advice.  Drink enough fluid to keep your urine clear or pale yellow.  Be as active as you can every day. Ask your health care provider to suggest safe exercises or activities.  Do not abuse drugs or alcohol.  Keep all follow-up visits as told by your  health care provider. This is important.  Do not smoke. Contact a health care provider if:   You have chills or fever.  Your pain medicine is not helping.  You have any changes in skin color.  You have new blisters or sores.  You develop warmth, redness, or swelling near a pressure injury.  You have a bad odor or pus coming from your pressure injury.  You lose control of your bowels or bladder.  You develop new symptoms.  Your wound does not improve after 1-2 weeks of treatment.  You develop a new medical condition, such as diabetes, peripheral vascular disease, or conditions that affect your defense (immune) system. This information is not intended to replace advice given to you by your health care provider. Make sure you discuss any questions you have with your health care provider. Document Released: 02/03/2005 Document Revised: 07/09/2015 Document Reviewed: 06/14/2014 Elsevier Interactive Patient Education  2018 [employer]  

## 2016-12-11 NOTE — Progress Notes (Signed)
   Subjective:    Patient ID: Dustin Medina, male    DOB: Nov 14, 1950, 66 y.o.   MRN: [id number]  HPI Patient comes in with 2 complaints: - patient had uti several weeks ago and was suppose to bring in urine for recheck. He brought in urine but they did not check it. He will leave one today. - he is c/o another lesion on his buttocks. Is in a different place then last one was. Has been draining. He is a paraplegic and sits in wheel chair all day every day. He is gong to wound care and they are dressing it-needs appointment with general surgeon.  BP Readings from Last 3 Encounters:  12/11/16 (!) 162/97  11/13/16 (!) 160/90  08/19/16 139/88   * blood pressures at home are normal  Review of Systems  Constitutional: Negative.   Respiratory: Negative.   Cardiovascular: Negative.   Genitourinary: Negative.   Neurological: Negative.   Psychiatric/Behavioral: Negative.   All other systems reviewed and are negative.      Objective:   Physical Exam  Constitutional: He is oriented to person, place, and time. He appears well-developed and well-nourished. No distress.  Eyes: EOM are normal.  Cardiovascular: Normal rate and regular rhythm.   Pulmonary/Chest: Effort normal and breath sounds normal.  Neurological: He is alert and oriented to person, place, and time.  Skin: Skin is warm.  3cm draining area just above anus in midline  Psychiatric: He has a normal mood and affect. His behavior is normal. Judgment and thought content normal.   BP (!) 162/97   Pulse 91   Temp 98 F (36.7 C) (Oral)   UA- positive leuks  Positive nitrites      Assessment & Plan:  1. Pain passing urine - Urinalysis, Complete - Urine Culture  2. Acute cystitis without hematuria Force fluids cultureordered - nitrofurantoin, macrocrystal-monohydrate, (MACROBID) 100 MG capsule; Take 1 capsule (100 mg total) by mouth 2 (two) times daily. 1 po BId  Dispense: 20 capsule; Refill: 0  3. Pressure injury of  sacral region, stage 2 Stay off buttocks asa much as possible - Ambulatory referral to [place], [place]
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
## 2016-12-11 NOTE — Addendum Note (Signed)
Addended by: Leeroy Cha on: 12/11/2016 10:13 AM   Modules accepted: Orders

## 2016-12-15 ENCOUNTER — Telehealth (HOSPITAL_COMMUNITY): Payer: Self-pay | Admitting: Physical Therapy

## 2016-12-15 ENCOUNTER — Ambulatory Visit (HOSPITAL_COMMUNITY): Payer: PPO | Admitting: Physical Therapy

## 2016-12-15 LAB — URINE CULTURE

## 2016-12-15 NOTE — Telephone Encounter (Signed)
PT did not show for appt.(NS #1)  Called and left message on voice mail regarding missed appt and reminder for next on Wednesday at [place], PTA/CLT [phone]

## 2016-12-17 ENCOUNTER — Ambulatory Visit (HOSPITAL_COMMUNITY): Payer: PPO | Admitting: Physical Therapy

## 2016-12-17 DIAGNOSIS — L89524 Pressure ulcer of left ankle, stage 4: Secondary | ICD-10-CM

## 2016-12-17 DIAGNOSIS — L89154 Pressure ulcer of sacral region, stage 4: Secondary | ICD-10-CM
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
## 2016-12-22 ENCOUNTER — Ambulatory Visit (INDEPENDENT_AMBULATORY_CARE_PROVIDER_SITE_OTHER): Payer: PPO

## 2016-12-22 ENCOUNTER — Ambulatory Visit (INDEPENDENT_AMBULATORY_CARE_PROVIDER_SITE_OTHER): Payer: PPO | Admitting: Family Medicine

## 2016-12-22 ENCOUNTER — Encounter: Payer: Self-pay | Admitting: Family Medicine

## 2016-12-22 VITALS — BP 140/75 | HR 84 | Temp 97.9°F | Ht 71.0 in

## 2016-12-22 DIAGNOSIS — S43421A Sprain of right rotator cuff capsule, initial encounter: Secondary | ICD-10-CM

## 2016-12-22 DIAGNOSIS — G8220 Paraplegia, unspecified: Secondary | ICD-10-CM | POA: Diagnosis not present

## 2016-12-22 DIAGNOSIS — M25511 Pain in right shoulder: Secondary | ICD-10-CM

## 2016-12-22 MED ORDER — BETAMETHASONE SOD PHOS & ACET 6 (3-3) MG/ML IJ SUSP
6.0000 mg | Freq: Once | INTRAMUSCULAR | Status: AC
  Administered 2016-12-22: 6 mg via INTRAMUSCULAR

## 2016-12-22 NOTE — Progress Notes (Signed)
Chief Complaint  Patient presents with  . Shoulder Pain    pt here today c/o right shoulder pain after having to pull himself up out of the floor back into his wheelchair    HPI  Patient presents today for impact on right shoulder.  He was adjusting his bearings on his wheelchair when the wheel came off of the axle and he fell over.  He tried to lift himself into another wheelchair.  This due to his paraplegia.  When he lifted himself he felt a pop.  He notes that he had to twist in an awkward manner when he lifted himself off of.  This occurred several days ago.  During that time he has had continued pain in the right shoulder.  It is painful but he is able to go about his routine and perform transfers.  However the transfers are quite painful.  PMH: Smoking status noted ROS: Review of Systems  Constitutional: Negative for chills, diaphoresis and fever.  HENT: Negative for rhinorrhea and sore throat.   Respiratory: Negative for cough and shortness of breath.   Cardiovascular: Negative for chest pain.  Gastrointestinal: Negative for abdominal pain.  Musculoskeletal: Positive for arthralgias. Negative for myalgias.  Skin: Negative for rash.  Neurological: Negative for weakness and headaches.     Objective: BP 140/75   Pulse 84   Temp 97.9 F (36.6 C) (Oral)   Ht 5\' 11"  (1.803 m)   BMI 21.62 kg/m  Gen: NAD, alert, cooperative with exam HEENT: NCAT, EOMI, PERRL CV: RRR, good S1/S2, no murmur Resp: CTABL, no wheezes, non-labored Abd: SNTND, BS present, no guarding or organomegaly Ext: No edema, warm.  There is decreased range of motion for rotation and tenderness for palpation of the deltoid insertion posterolaterally as well as the greater head of the biceps tendon of origin. Neuro: Alert and oriented, No gross deficits  Assessment and plan:  1. Acute pain of right shoulder   2. Sprain of right rotator cuff capsule, initial encounter   3. Paraplegic spinal paralysis ([place])      Meds ordered this encounter  Medications  . betamethasone acetate-betamethasone sodium phosphate (CELESTONE) injection 6 mg    Orders Placed This Encounter  Procedures  . DG Shoulder Right    Standing Status:   Future    Number of Occurrences:   1    Standing Expiration Date:   02/21/2018    Order Specific Question:   Reason for Exam (SYMPTOM  OR DIAGNOSIS REQUIRED)    Answer:   shoulder pain    Order Specific Question:   Preferred imaging location?    Answer:   Internal  . Ambulatory referral to Physical Therapy    Referral Priority:   Routine    Referral Type:   Physical Medicine    Referral Reason:   Specialty Services Required    Requested Specialty:   Physical Therapy    Number of Visits Requested:   1    Follow up with Ms. Gaffey in 2 weeks.  Claretta Fraise, MD

## 2016-12-24 ENCOUNTER — Encounter (HOSPITAL_COMMUNITY): Payer: Self-pay | Admitting: Physical Therapy

## 2016-12-24 ENCOUNTER — Ambulatory Visit (HOSPITAL_COMMUNITY): Payer: PPO | Attending: Nurse Practitioner | Admitting: Physical Therapy

## 2016-12-24 DIAGNOSIS — L89524 Pressure ulcer of left ankle, stage 4: Secondary | ICD-10-CM | POA: Insufficient documentation

## 2016-12-24 DIAGNOSIS — L89154 Pressure ulcer of sacral region, stage 4: Secondary | ICD-10-CM | POA: Diagnosis not present
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
## 2016-12-25 DIAGNOSIS — Z872 Personal history of diseases of the skin and subcutaneous tissue: Secondary | ICD-10-CM | POA: Diagnosis not present

## 2016-12-25 DIAGNOSIS — K623 Rectal prolapse: Secondary | ICD-10-CM | POA: Diagnosis not present

## 2016-12-25 DIAGNOSIS — G8220 Paraplegia, unspecified: Secondary | ICD-10-CM | POA: Diagnosis not present

## 2016-12-25 DIAGNOSIS — Z0489 Encounter for examination and observation for other specified reasons: Secondary | ICD-10-CM | POA: Diagnosis not present

## 2016-12-26 ENCOUNTER — Encounter (HOSPITAL_COMMUNITY): Payer: Self-pay

## 2016-12-26 ENCOUNTER — Ambulatory Visit (HOSPITAL_COMMUNITY): Payer: PPO

## 2016-12-26 DIAGNOSIS — L89154 Pressure ulcer of sacral region, stage 4: Secondary | ICD-10-CM

## 2016-12-26 DIAGNOSIS — L89524 Pressure ulcer of left ankle, stage 4: Secondary | ICD-10-CM
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
## 2016-12-30 ENCOUNTER — Ambulatory Visit (HOSPITAL_COMMUNITY): Payer: PPO

## 2016-12-30 ENCOUNTER — Encounter (HOSPITAL_COMMUNITY): Payer: Self-pay

## 2016-12-30 DIAGNOSIS — L89524 Pressure ulcer of left ankle, stage 4: Secondary | ICD-10-CM | POA: Diagnosis not present

## 2016-12-30 DIAGNOSIS — L89154 Pressure ulcer of sacral region, stage 4: Secondary | ICD-10-CM
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
## 2017-01-01 ENCOUNTER — Ambulatory Visit (HOSPITAL_COMMUNITY): Payer: PPO | Admitting: Physical Therapy

## 2017-01-01 DIAGNOSIS — L89154 Pressure ulcer of sacral region, stage 4: Secondary | ICD-10-CM

## 2017-01-01 DIAGNOSIS — L89524 Pressure ulcer of left ankle, stage 4: Secondary | ICD-10-CM | POA: Diagnosis not present

## 2017-01-01 DIAGNOSIS — N390 Urinary tract infection, site not specified: Secondary | ICD-10-CM | POA: Diagnosis not present

## 2017-01-01 DIAGNOSIS — N319 Neuromuscular dysfunction of bladder, unspecified: Secondary | ICD-10-CM | POA: Diagnosis not present

## 2017-01-01 DIAGNOSIS — R339 Retention of urine, unspecified: Secondary | ICD-10-CM | POA: Diagnosis not present
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
## 2017-01-03 ENCOUNTER — Other Ambulatory Visit: Payer: Self-pay | Admitting: Nurse Practitioner

## 2017-01-05 ENCOUNTER — Ambulatory Visit (HOSPITAL_COMMUNITY): Payer: PPO | Admitting: Physical Therapy

## 2017-01-05 ENCOUNTER — Encounter (HOSPITAL_COMMUNITY): Payer: Self-pay | Admitting: Physical Therapy

## 2017-01-05 DIAGNOSIS — L89524 Pressure ulcer of left ankle, stage 4: Secondary | ICD-10-CM

## 2017-01-05 DIAGNOSIS — L89154 Pressure ulcer of sacral region, stage 4: Secondary | ICD-10-CM
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
## 2017-01-07 ENCOUNTER — Ambulatory Visit (HOSPITAL_COMMUNITY): Payer: PPO | Admitting: Physical Therapy

## 2017-01-07 DIAGNOSIS — L89524 Pressure ulcer of left ankle, stage 4: Secondary | ICD-10-CM | POA: Diagnosis not present

## 2017-01-07 DIAGNOSIS — L89154 Pressure ulcer of sacral region, stage 4: Secondary | ICD-10-CM
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
## 2017-01-12 ENCOUNTER — Telehealth (HOSPITAL_COMMUNITY): Payer: Self-pay | Admitting: Physical Therapy

## 2017-01-12 ENCOUNTER — Ambulatory Visit (HOSPITAL_COMMUNITY): Payer: PPO | Admitting: Physical Therapy

## 2017-01-12 NOTE — Telephone Encounter (Signed)
Pt called re 9:00 appointment.  PT thought that his appointment was at 9:45 and was on the way.  Explained that he could come in and if someone had a 9:45 appointment open up we would be happy to see him but at this time all 9:45 slots are filled.  Rayetta Humphrey, [place] CLT [phone]

## 2017-01-14 ENCOUNTER — Telehealth (HOSPITAL_COMMUNITY): Payer: Self-pay | Admitting: Physical Therapy

## 2017-01-14 ENCOUNTER — Encounter (HOSPITAL_COMMUNITY): Payer: Self-pay

## 2017-01-14 ENCOUNTER — Ambulatory Visit (HOSPITAL_COMMUNITY): Payer: PPO

## 2017-01-14 DIAGNOSIS — L89524 Pressure ulcer of left ankle, stage 4: Secondary | ICD-10-CM

## 2017-01-14 DIAGNOSIS — L89154 Pressure ulcer of sacral region, stage 4: Secondary | ICD-10-CM

## 2017-01-14 DIAGNOSIS — M6258 Muscle wasting and atrophy, not elsewhere classified, other site: Secondary | ICD-10-CM | POA: Diagnosis not present
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
## 2017-01-14 NOTE — Telephone Encounter (Signed)
Patient's appt was switched to 01-14-17

## 2017-01-15 ENCOUNTER — Ambulatory Visit (HOSPITAL_COMMUNITY): Payer: PPO | Admitting: Physical Therapy

## 2017-01-19 DIAGNOSIS — R972 Elevated prostate specific antigen [PSA]: Secondary | ICD-10-CM | POA: Diagnosis not present

## 2017-01-20 ENCOUNTER — Ambulatory Visit (HOSPITAL_COMMUNITY): Payer: PPO | Attending: Nurse Practitioner | Admitting: Physical Therapy

## 2017-01-20 DIAGNOSIS — L89154 Pressure ulcer of sacral region, stage 4: Secondary | ICD-10-CM | POA: Insufficient documentation

## 2017-01-20 DIAGNOSIS — L89524 Pressure ulcer of left ankle, stage 4: Secondary | ICD-10-CM | POA: Diagnosis not present
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
## 2017-01-23 ENCOUNTER — Encounter (HOSPITAL_COMMUNITY): Payer: Self-pay | Admitting: Physical Therapy

## 2017-01-23 ENCOUNTER — Ambulatory Visit (HOSPITAL_COMMUNITY): Payer: PPO | Admitting: Physical Therapy

## 2017-01-23 DIAGNOSIS — L89524 Pressure ulcer of left ankle, stage 4: Secondary | ICD-10-CM | POA: Diagnosis not present

## 2017-01-23 DIAGNOSIS — L89154 Pressure ulcer of sacral region, stage 4: Secondary | ICD-10-CM
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
## 2017-01-27 ENCOUNTER — Telehealth (HOSPITAL_COMMUNITY): Payer: Self-pay | Admitting: Physical Therapy

## 2017-01-27 ENCOUNTER — Ambulatory Visit (HOSPITAL_COMMUNITY): Payer: PPO | Admitting: Physical Therapy
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
## 2017-01-29 ENCOUNTER — Other Ambulatory Visit: Payer: Self-pay | Admitting: Nurse Practitioner

## 2017-01-29 DIAGNOSIS — N3020 Other chronic cystitis without hematuria: Secondary | ICD-10-CM | POA: Diagnosis not present

## 2017-01-29 DIAGNOSIS — N312 Flaccid neuropathic bladder, not elsewhere classified: Secondary | ICD-10-CM | POA: Diagnosis not present

## 2017-01-29 DIAGNOSIS — R972 Elevated prostate specific antigen [PSA]: Secondary | ICD-10-CM | POA: Diagnosis not present

## 2017-01-30 ENCOUNTER — Ambulatory Visit (HOSPITAL_COMMUNITY): Payer: PPO

## 2017-01-30 ENCOUNTER — Encounter (HOSPITAL_COMMUNITY): Payer: Self-pay

## 2017-01-30 DIAGNOSIS — L89524 Pressure ulcer of left ankle, stage 4: Secondary | ICD-10-CM | POA: Diagnosis not present
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
## 2017-01-31 ENCOUNTER — Other Ambulatory Visit: Payer: Self-pay | Admitting: Pediatrics

## 2017-01-31 DIAGNOSIS — I10 Essential (primary) hypertension: Secondary | ICD-10-CM

## 2017-02-06 ENCOUNTER — Ambulatory Visit (HOSPITAL_COMMUNITY): Payer: PPO | Admitting: Physical Therapy

## 2017-02-06 ENCOUNTER — Encounter (HOSPITAL_COMMUNITY): Payer: Self-pay | Admitting: Physical Therapy

## 2017-02-06 DIAGNOSIS — L89524 Pressure ulcer of left ankle, stage 4: Secondary | ICD-10-CM

## 2017-02-06 DIAGNOSIS — N390 Urinary tract infection, site not specified: Secondary | ICD-10-CM | POA: Diagnosis not present

## 2017-02-06 DIAGNOSIS — N319 Neuromuscular dysfunction of bladder, unspecified: Secondary | ICD-10-CM | POA: Diagnosis not present
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
## 2017-02-12 ENCOUNTER — Ambulatory Visit (HOSPITAL_COMMUNITY): Payer: PPO | Admitting: Physical Therapy

## 2017-02-12 DIAGNOSIS — L89524 Pressure ulcer of left ankle, stage 4: Secondary | ICD-10-CM | POA: Diagnosis not present
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
## 2017-02-19 ENCOUNTER — Ambulatory Visit (HOSPITAL_COMMUNITY): Payer: PPO

## 2017-02-23 DIAGNOSIS — G8220 Paraplegia, unspecified: Secondary | ICD-10-CM | POA: Diagnosis not present

## 2017-02-24 ENCOUNTER — Ambulatory Visit (HOSPITAL_COMMUNITY): Payer: PPO | Admitting: Physical Therapy

## 2017-02-26 ENCOUNTER — Ambulatory Visit (HOSPITAL_COMMUNITY): Payer: PPO

## 2017-03-03 ENCOUNTER — Ambulatory Visit (HOSPITAL_COMMUNITY): Payer: PPO | Admitting: Physical Therapy

## 2017-03-03 DIAGNOSIS — R972 Elevated prostate specific antigen [PSA]: Secondary | ICD-10-CM | POA: Diagnosis not present

## 2017-03-03 DIAGNOSIS — N411 Chronic prostatitis: Secondary | ICD-10-CM | POA: Diagnosis not present

## 2017-03-05 ENCOUNTER — Ambulatory Visit (HOSPITAL_COMMUNITY): Payer: PPO | Admitting: Physical Therapy

## 2017-03-10 ENCOUNTER — Ambulatory Visit (HOSPITAL_COMMUNITY): Payer: PPO | Admitting: Physical Therapy

## 2017-03-12 ENCOUNTER — Ambulatory Visit (HOSPITAL_COMMUNITY): Payer: PPO

## 2017-03-17 ENCOUNTER — Ambulatory Visit (HOSPITAL_COMMUNITY): Payer: PPO | Admitting: Physical Therapy

## 2017-03-19 ENCOUNTER — Ambulatory Visit (HOSPITAL_COMMUNITY): Payer: PPO | Admitting: Physical Therapy

## 2017-03-20 ENCOUNTER — Ambulatory Visit: Payer: PPO | Admitting: Gastroenterology

## 2017-03-20 ENCOUNTER — Encounter: Payer: Self-pay | Admitting: Gastroenterology

## 2017-03-20 ENCOUNTER — Other Ambulatory Visit: Payer: Self-pay

## 2017-03-20 VITALS — BP 147/87 | HR 83 | Temp 97.0°F | Ht 71.0 in

## 2017-03-20 DIAGNOSIS — K219 Gastro-esophageal reflux disease without esophagitis: Secondary | ICD-10-CM | POA: Diagnosis not present

## 2017-03-20 DIAGNOSIS — Z1211 Encounter for screening for malignant neoplasm of colon: Secondary | ICD-10-CM | POA: Insufficient documentation

## 2017-03-20 MED ORDER — PEG 3350-KCL-NA BICARB-NACL 420 G PO SOLR: 4000.0000 mL | ORAL | 0 refills | Status: DC

## 2017-03-20 NOTE — Patient Instructions (Addendum)
We have scheduled you for a colonoscopy with Dr. Gala Romney in the near future.  Avoid constipation, straining. Continue supplemental fiber as you are doing.  I would recommend following the reflux diet handout. Do not eat late at night to avoid reflux issues at night. Avoid eating 3 hours before going to bed. If needed, you can take Prilosec 30 minutes before breakfast as needed. If you have any problems with swallowing, nausea, vomiting, or worsening reflux, let us know.    Food Choices for Gastroesophageal Reflux Disease, Adult When you have gastroesophageal reflux disease (GERD), the foods you eat and your eating habits are very important. Choosing the right foods can help ease the discomfort of GERD. Consider working with a diet and nutrition specialist (dietitian) to help you make healthy food choices. What general guidelines should I follow? Eating plan  Choose healthy foods low in fat, such as fruits, vegetables, whole grains, low-fat dairy products, and lean meat, fish, and poultry.  Eat frequent, small meals instead of three large meals each day. Eat your meals slowly, in a relaxed setting. Avoid bending over or lying down until 2-3 hours after eating.  Limit high-fat foods such as fatty meats or fried foods.  Limit your intake of oils, butter, and shortening to less than 8 teaspoons each day.  Avoid the following: ? Foods that cause symptoms. These may be different for different people. Keep a food diary to keep track of foods that cause symptoms. ? Alcohol. ? Drinking large amounts of liquid with meals. ? Eating meals during the 2-3 hours before bed.  Cook foods using methods other than frying. This may include baking, grilling, or broiling. Lifestyle   Maintain a healthy weight. Ask your health care provider what weight is healthy for you. If you need to lose weight, work with your health care provider to do so safely.  Exercise for at least 30 minutes on 5 or more days each  week, or as told by your health care provider.  Avoid wearing clothes that fit tightly around your waist and chest.  Do not use any products that contain nicotine or tobacco, such as cigarettes and e-cigarettes. If you need help quitting, ask your health care provider.  Sleep with the head of your bed raised. Use a wedge under the mattress or blocks under the bed frame to raise the head of the bed. What foods are not recommended? The items listed may not be a complete list. Talk with your dietitian about what dietary choices are best for you. Grains Pastries or quick breads with added fat. [place] toast. Vegetables Deep fried vegetables. [place] fries. Any vegetables prepared with added fat. Any vegetables that cause symptoms. For some people this may include tomatoes and tomato products, chili peppers, onions and garlic, and horseradish. Fruits Any fruits prepared with added fat. Any fruits that cause symptoms. For some people this may include citrus fruits, such as oranges, grapefruit, pineapple, and lemons. Meats and other protein foods High-fat meats, such as fatty beef or pork, hot dogs, ribs, ham, sausage, salami and bacon. Fried meat or protein, including fried fish and fried chicken. Nuts and nut butters. Dairy Whole milk and chocolate milk. Sour cream. Cream. Ice cream. Cream cheese. Milk shakes. Beverages Coffee and tea, with or without caffeine. Carbonated beverages. Sodas. Energy drinks. Fruit juice made with acidic fruits (such as orange or grapefruit). Tomato juice. Alcoholic drinks. Fats and oils Butter. Margarine. Shortening. Ghee. Sweets and desserts Chocolate and cocoa. Donuts. Seasoning and  other foods Pepper. Peppermint and spearmint. Any condiments, herbs, or seasonings that cause symptoms. For some people, this may include curry, hot sauce, or vinegar-based salad dressings. Summary  When you have gastroesophageal reflux disease (GERD), food and lifestyle choices are  very important to help ease the discomfort of GERD.  Eat frequent, small meals instead of three large meals each day. Eat your meals slowly, in a relaxed setting. Avoid bending over or lying down until 2-3 hours after eating.  Limit high-fat foods such as fatty meat or fried foods. This information is not intended to replace advice given to you by your health care provider. Make sure you discuss any questions you have with your health care provider. Document Released: 02/03/2005 Document Revised: 02/05/2016 Document Reviewed: 02/05/2016 Elsevier Interactive Patient Education  [employer].
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
## 2017-03-23 ENCOUNTER — Telehealth: Payer: Self-pay

## 2017-03-23 DIAGNOSIS — N319 Neuromuscular dysfunction of bladder, unspecified: Secondary | ICD-10-CM | POA: Diagnosis not present

## 2017-03-23 DIAGNOSIS — N390 Urinary tract infection, site not specified: Secondary | ICD-10-CM | POA: Diagnosis not present

## 2017-03-23 DIAGNOSIS — R339 Retention of urine, unspecified: Secondary | ICD-10-CM | POA: Diagnosis not present

## 2017-03-23 NOTE — Telephone Encounter (Signed)
Tried to call pt to inform of pre-op appt 04/09/17 at 1:45pm, no answer, LMOVM. Letter mailed.

## 2017-03-25 NOTE — Progress Notes (Signed)
CC'D TO PCP °

## 2017-03-25 NOTE — Assessment & Plan Note (Signed)
Noted nocturnal reflux after eating late. Otherwise, doing well. Prilosec prn. GERD diet/behaviors discussed in detail.

## 2017-03-28 ENCOUNTER — Other Ambulatory Visit: Payer: Self-pay | Admitting: Nurse Practitioner

## 2017-03-30 NOTE — Telephone Encounter (Signed)
Last seen 12/22/16  MMM

## 2017-04-07 NOTE — Patient Instructions (Addendum)
Dustin Medina  04/07/2017     @PREFPERIOPPHARMACY @   Your procedure is scheduled on   04/16/2017   Report to [facility] at  830   A.M.  Call this number if you have problems the morning of surgery:  [phone]   Remember:  Do not eat food or drink liquids after midnight.  Take these medicines the morning of surgery with A SIP OF WATER  Flexaril, mobic, zofran, phenergan, diovan.   Do not wear jewelry, make-up or nail polish.  Do not wear lotions, powders, or perfumes, or deodorant.  Do not shave 48 hours prior to surgery.  Men may shave face and neck.  Do not bring valuables to the hospital.  [facility] is not responsible for any belongings or valuables.  Contacts, dentures or bridgework may not be worn into surgery.  Leave your suitcase in the car.  After surgery it may be brought to your room.  For patients admitted to the hospital, discharge time will be determined by your treatment team.  Patients discharged the day of surgery will not be allowed to drive home.   Name and phone number of your driver:   family Special instructions:  Follow the diet and prep instructions given to you by Dr Roseanne Kaufman office.  Please read over the following fact sheets that you were given. Anesthesia Post-op Instructions and Care and Recovery After Surgery       Colonoscopy, Adult A colonoscopy is an exam to look at the large intestine. It is done to check for problems, such as:  Lumps (tumors).  Growths (polyps).  Swelling (inflammation).  Bleeding.  What happens before the procedure? Eating and drinking Follow instructions from your doctor about eating and drinking. These instructions may include:  A few days before the procedure - follow a low-fiber diet. ? Avoid nuts. ? Avoid seeds. ? Avoid dried fruit. ? Avoid raw fruits. ? Avoid vegetables.  1-3 days before the procedure - follow a clear liquid diet. Avoid liquids that have red or purple dye. Drink  only clear liquids, such as: ? Clear broth or bouillon. ? Black coffee or tea. ? Clear juice. ? Clear soft drinks or sports drinks. ? Gelatin dessert. ? Popsicles.  On the day of the procedure - do not eat or drink anything during the 2 hours before the procedure.  Bowel prep If you were prescribed an oral bowel prep:  Take it as told by your doctor. Starting the day before your procedure, you will need to drink a lot of liquid. The liquid will cause you to poop (have bowel movements) until your poop is almost clear or light green.  If your skin or butt gets irritated from diarrhea, you may: ? Wipe the area with wipes that have medicine in them, such as adult wet wipes with aloe and vitamin E. ? Put something on your skin that soothes the area, such as petroleum jelly.  If you throw up (vomit) while drinking the bowel prep, take a break for up to 60 minutes. Then begin the bowel prep again. If you keep throwing up and you cannot take the bowel prep without throwing up, call your doctor.  General instructions  Ask your doctor about changing or stopping your normal medicines. This is important if you take diabetes medicines or blood thinners.  Plan to have someone take you home from the hospital or clinic. What happens during the procedure?  An IV tube may be put into one of your veins.  You will be given medicine to help you relax (sedative).  To reduce your risk of infection: ? Your doctors will wash their hands. ? Your anal area will be washed with soap.  You will be asked to lie on your side with your knees bent.  Your doctor will get a long, thin, flexible tube ready. The tube will have a camera and a light on the end.  The tube will be put into your anus.  The tube will be gently put into your large intestine.  Air will be delivered into your large intestine to keep it open. You may feel some pressure or cramping.  The camera will be used to take photos.  A small  tissue sample may be removed from your body to be looked at under a microscope (biopsy). If any possible problems are found, the tissue will be sent to a lab for testing.  If small growths are found, your doctor may remove them and have them checked for cancer.  The tube that was put into your anus will be slowly removed. The procedure may vary among doctors and hospitals. What happens after the procedure?  Your doctor will check on you often until the medicines you were given have worn off.  Do not drive for 24 hours after the procedure.  You may have a small amount of blood in your poop.  You may pass gas.  You may have mild cramps or bloating in your belly (abdomen).  It is up to you to get the results of your procedure. Ask your doctor, or the department performing the procedure, when your results will be ready. This information is not intended to replace advice given to you by your health care provider. Make sure you discuss any questions you have with your health care provider. Document Released: 03/08/2010 Document Revised: 12/05/2015 Document Reviewed: 04/17/2015 Elsevier Interactive Patient Education  2017 [employer] Inc[employer]  Colonoscopy, Adult, Care After This sheet gives you information about how to care for yourself after your procedure. Your health care provider may also give you more specific instructions. If you have problems or questions, contact your health care provider. What can I expect after the procedure? After the procedure, it is common to have:  A small amount of blood in your stool for 24 hours after the procedure.  Some gas.  Mild abdominal cramping or bloating.  Follow these instructions at home: General instructions   For the first 24 hours after the procedure: ? Do not drive or use machinery. ? Do not sign important documents. ? Do not drink alcohol. ? Do your regular daily activities at a slower pace than normal. ? Eat soft, easy-to-digest  foods. ? Rest often.  Take over-the-counter or prescription medicines only as told by your health care provider.  It is up to you to get the results of your procedure. Ask your health care provider, or the department performing the procedure, when your results will be ready. Relieving cramping and bloating  Try walking around when you have cramps or feel bloated.  Apply heat to your abdomen as told by your health care provider. Use a heat source that your health care provider recommends, such as a moist heat pack or a heating pad. ? Place a towel between your skin and the heat source. ? Leave the heat on for 20-30 minutes. ? Remove the heat if your skin turns bright red. This is  especially important if you are unable to feel pain, heat, or cold. You may have a greater risk of getting burned. Eating and drinking  Drink enough fluid to keep your urine clear or pale yellow.  Resume your normal diet as instructed by your health care provider. Avoid heavy or fried foods that are hard to digest.  Avoid drinking alcohol for as long as instructed by your health care provider. Contact a health care provider if:  You have blood in your stool 2-3 days after the procedure. Get help right away if:  You have more than a small spotting of blood in your stool.  You pass large blood clots in your stool.  Your abdomen is swollen.  You have nausea or vomiting.  You have a fever.  You have increasing abdominal pain that is not relieved with medicine. This information is not intended to replace advice given to you by your health care provider. Make sure you discuss any questions you have with your health care provider. Document Released: 09/18/2003 Document Revised: 10/29/2015 Document Reviewed: 04/17/2015 Elsevier Interactive Patient Education  2018 [place] Anesthesia is a term that refers to techniques, procedures, and medicines that help a person stay safe  and comfortable during a medical procedure. Monitored anesthesia care, or sedation, is one type of anesthesia. Your anesthesia specialist may recommend sedation if you will be having a procedure that does not require you to be unconscious, such as:  Cataract surgery.  A dental procedure.  A biopsy.  A colonoscopy.  During the procedure, you may receive a medicine to help you relax (sedative). There are three levels of sedation:  Mild sedation. At this level, you may feel awake and relaxed. You will be able to follow directions.  Moderate sedation. At this level, you will be sleepy. You may not remember the procedure.  Deep sedation. At this level, you will be asleep. You will not remember the procedure.  The more medicine you are given, the deeper your level of sedation will be. Depending on how you respond to the procedure, the anesthesia specialist may change your level of sedation or the type of anesthesia to fit your needs. An anesthesia specialist will monitor you closely during the procedure. Let your health care provider know about:  Any allergies you have.  All medicines you are taking, including vitamins, herbs, eye drops, creams, and over-the-counter medicines.  Any use of steroids (by mouth or as a cream).  Any problems you or family members have had with sedatives and anesthetic medicines.  Any blood disorders you have.  Any surgeries you have had.  Any medical conditions you have, such as sleep apnea.  Whether you are pregnant or may be pregnant.  Any use of cigarettes, alcohol, or street drugs. What are the risks? Generally, this is a safe procedure. However, problems may occur, including:  Getting too much medicine (oversedation).  Nausea.  Allergic reaction to medicines.  Trouble breathing. If this happens, a breathing tube may be used to help with breathing. It will be removed when you are awake and breathing on your own.  Heart trouble.  Lung  trouble.  Before the procedure Staying hydrated Follow instructions from your health care provider about hydration, which may include:  Up to 2 hours before the procedure - you may continue to drink clear liquids, such as water, clear fruit juice, black coffee, and plain tea.  Eating and drinking restrictions Follow instructions from your health care provider  about eating and drinking, which may include:  8 hours before the procedure - stop eating heavy meals or foods such as meat, fried foods, or fatty foods.  6 hours before the procedure - stop eating light meals or foods, such as toast or cereal.  6 hours before the procedure - stop drinking milk or drinks that contain milk.  2 hours before the procedure - stop drinking clear liquids.  Medicines Ask your health care provider about:  Changing or stopping your regular medicines. This is especially important if you are taking diabetes medicines or blood thinners.  Taking medicines such as aspirin and ibuprofen. These medicines can thin your blood. Do not take these medicines before your procedure if your health care provider instructs you not to.  Tests and exams  You will have a physical exam.  You may have blood tests done to show: ? How well your kidneys and liver are working. ? How well your blood can clot.  General instructions  Plan to have someone take you home from the hospital or clinic.  If you will be going home right after the procedure, plan to have someone with you for 24 hours.  What happens during the procedure?  Your blood pressure, heart rate, breathing, level of pain and overall condition will be monitored.  An IV tube will be inserted into one of your veins.  Your anesthesia specialist will give you medicines as needed to keep you comfortable during the procedure. This may mean changing the level of sedation.  The procedure will be performed. After the procedure  Your blood pressure, heart rate,  breathing rate, and blood oxygen level will be monitored until the medicines you were given have worn off.  Do not drive for 24 hours if you received a sedative.  You may: ? Feel sleepy, clumsy, or nauseous. ? Feel forgetful about what happened after the procedure. ? Have a sore throat if you had a breathing tube during the procedure. ? Vomit. This information is not intended to replace advice given to you by your health care provider. Make sure you discuss any questions you have with your health care provider. Document Released: 10/30/2004 Document Revised: 07/13/2015 Document Reviewed: 05/27/2015 Elsevier Interactive Patient Education  2018 [place], Care After These instructions provide you with information about caring for yourself after your procedure. Your health care provider may also give you more specific instructions. Your treatment has been planned according to current medical practices, but problems sometimes occur. Call your health care provider if you have any problems or questions after your procedure. What can I expect after the procedure? After your procedure, it is common to:  Feel sleepy for several hours.  Feel clumsy and have poor balance for several hours.  Feel forgetful about what happened after the procedure.  Have poor judgment for several hours.  Feel nauseous or vomit.  Have a sore throat if you had a breathing tube during the procedure.  Follow these instructions at home: For at least 24 hours after the procedure:   Do not: ? Participate in activities in which you could fall or become injured. ? Drive. ? Use heavy machinery. ? Drink alcohol. ? Take sleeping pills or medicines that cause drowsiness. ? Make important decisions or sign legal documents. ? Take care of children on your own.  Rest. Eating and drinking  Follow the diet that is recommended by your health care provider.  If you vomit, drink water,  juice, or soup when you can drink without vomiting.  Make sure you have little or no nausea before eating solid foods. General instructions  Have a responsible adult stay with you until you are awake and alert.  Take over-the-counter and prescription medicines only as told by your health care provider.  If you smoke, do not smoke without supervision.  Keep all follow-up visits as told by your health care provider. This is important. Contact a health care provider if:  You keep feeling nauseous or you keep vomiting.  You feel light-headed.  You develop a rash.  You have a fever. Get help right away if:  You have trouble breathing. This information is not intended to replace advice given to you by your health care provider. Make sure you discuss any questions you have with your health care provider. Document Released: 05/27/2015 Document Revised: 09/26/2015 Document Reviewed: 05/27/2015 Elsevier Interactive Patient Education  [employer].

## 2017-04-09 ENCOUNTER — Other Ambulatory Visit: Payer: Self-pay

## 2017-04-09 ENCOUNTER — Encounter (HOSPITAL_COMMUNITY): Payer: Self-pay

## 2017-04-09 ENCOUNTER — Encounter (HOSPITAL_COMMUNITY)
Admission: RE | Admit: 2017-04-09 | Discharge: 2017-04-09 | Disposition: A | Discharge status: Home / Self Care | Payer: PPO | Source: Ambulatory Visit | Attending: Internal Medicine | Admitting: Internal Medicine

## 2017-04-09 DIAGNOSIS — I4510 Unspecified right bundle-branch block: Secondary | ICD-10-CM | POA: Insufficient documentation

## 2017-04-09 DIAGNOSIS — K219 Gastro-esophageal reflux disease without esophagitis: Secondary | ICD-10-CM | POA: Diagnosis not present

## 2017-04-09 DIAGNOSIS — Z79899 Other long term (current) drug therapy: Secondary | ICD-10-CM | POA: Diagnosis not present

## 2017-04-09 DIAGNOSIS — I10 Essential (primary) hypertension: Secondary | ICD-10-CM | POA: Diagnosis not present

## 2017-04-09 DIAGNOSIS — K449 Diaphragmatic hernia without obstruction or gangrene: Secondary | ICD-10-CM | POA: Diagnosis not present

## 2017-04-09 DIAGNOSIS — Z01812 Encounter for preprocedural laboratory examination: Secondary | ICD-10-CM | POA: Insufficient documentation

## 2017-04-09 DIAGNOSIS — G709 Myoneural disorder, unspecified: Secondary | ICD-10-CM | POA: Insufficient documentation

## 2017-04-09 DIAGNOSIS — F17200 Nicotine dependence, unspecified, uncomplicated: Secondary | ICD-10-CM | POA: Insufficient documentation

## 2017-04-09 DIAGNOSIS — Z01810 Encounter for preprocedural cardiovascular examination: Secondary | ICD-10-CM | POA: Diagnosis not present

## 2017-04-09 DIAGNOSIS — G4730 Sleep apnea, unspecified: Secondary | ICD-10-CM | POA: Insufficient documentation

## 2017-04-09 HISTORY — DX: Unspecified osteoarthritis, unspecified site: M19.90

## 2017-04-09 HISTORY — DX: Personal history of urinary calculi: Z87.442

## 2017-04-09 LAB — BASIC METABOLIC PANEL
Anion gap: 13 (ref 5–15)
BUN: 13 mg/dL (ref 6–20)
CHLORIDE: 101 mmol/L (ref 101–111)
CO2: 24 mmol/L (ref 22–32)
CREATININE: 0.62 mg/dL (ref 0.61–1.24)
Calcium: 8.8 mg/dL — ABNORMAL LOW (ref 8.9–10.3)
GFR calc Af Amer: >60 mL/min (ref >60)
GFR calc non Af Amer: >60 mL/min (ref >60)
Glucose, Bld: 97 mg/dL (ref 65–99)
POTASSIUM: 3.4 mmol/L — AB (ref 3.5–5.1)
Sodium: 138 mmol/L (ref 135–145)

## 2017-04-09 LAB — CBC
HEMATOCRIT: 42.8 % (ref 39.0–52.0)
Hemoglobin: 13.8 g/dL (ref 13.0–17.0)
MCH: 29.2 pg (ref 26.0–34.0)
MCHC: 32.2 g/dL (ref 30.0–36.0)
MCV: 90.7 fL (ref 78.0–100.0)
Platelets: 319 10*3/uL (ref 150–400)
RBC: 4.72 MIL/uL (ref 4.22–5.81)
RDW: 14.5 % (ref 11.5–15.5)
WBC: 9.9 10*3/uL (ref 4.0–10.5)

## 2017-04-10 ENCOUNTER — Other Ambulatory Visit: Payer: Self-pay | Admitting: Gastroenterology

## 2017-04-10 MED ORDER — POTASSIUM CHLORIDE ER 10 MEQ PO TBCR: 20.0000 meq | EXTENDED_RELEASE_TABLET | Freq: Two times a day (BID) | ORAL | 0 refills | Status: DC

## 2017-04-10 NOTE — Progress Notes (Unsigned)
Potassium sent to pharmacy 

## 2017-04-10 NOTE — H&P (View-Only) (Signed)
Very mildly low potassium. Sending in potassium pills to take BID for 2 days. Please let him know.

## 2017-04-10 NOTE — Progress Notes (Signed)
Very mildly low potassium. Sending in potassium pills to take BID for 2 days. Please let him know.

## 2017-04-13 ENCOUNTER — Other Ambulatory Visit: Payer: Self-pay

## 2017-04-16 ENCOUNTER — Encounter (HOSPITAL_COMMUNITY)
Admission: RE | Disposition: A | Discharge status: Home / Self Care | Payer: Self-pay | Source: Ambulatory Visit | Attending: Internal Medicine

## 2017-04-16 ENCOUNTER — Encounter (HOSPITAL_COMMUNITY): Payer: Self-pay | Admitting: *Deleted

## 2017-04-16 ENCOUNTER — Ambulatory Visit (HOSPITAL_COMMUNITY)
Admission: RE | Admit: 2017-04-16 | Discharge: 2017-04-16 | Disposition: A | Discharge status: Home / Self Care | Payer: PPO | Source: Ambulatory Visit | Attending: Internal Medicine | Admitting: Internal Medicine

## 2017-04-16 ENCOUNTER — Ambulatory Visit (HOSPITAL_COMMUNITY): Payer: PPO | Admitting: Anesthesiology

## 2017-04-16 ENCOUNTER — Other Ambulatory Visit: Payer: Self-pay

## 2017-04-16 DIAGNOSIS — G4730 Sleep apnea, unspecified: Secondary | ICD-10-CM | POA: Diagnosis not present

## 2017-04-16 DIAGNOSIS — Z87898 Personal history of other specified conditions: Secondary | ICD-10-CM | POA: Insufficient documentation

## 2017-04-16 DIAGNOSIS — F17210 Nicotine dependence, cigarettes, uncomplicated: Secondary | ICD-10-CM | POA: Diagnosis not present

## 2017-04-16 DIAGNOSIS — Z79899 Other long term (current) drug therapy: Secondary | ICD-10-CM | POA: Diagnosis not present

## 2017-04-16 DIAGNOSIS — M1990 Unspecified osteoarthritis, unspecified site: Secondary | ICD-10-CM | POA: Insufficient documentation

## 2017-04-16 DIAGNOSIS — Z1211 Encounter for screening for malignant neoplasm of colon: Secondary | ICD-10-CM | POA: Diagnosis not present

## 2017-04-16 DIAGNOSIS — Z86010 Personal history of colonic polyps: Secondary | ICD-10-CM | POA: Diagnosis not present

## 2017-04-16 DIAGNOSIS — K635 Polyp of colon: Secondary | ICD-10-CM | POA: Diagnosis not present

## 2017-04-16 DIAGNOSIS — Z87828 Personal history of other (healed) physical injury and trauma: Secondary | ICD-10-CM | POA: Insufficient documentation

## 2017-04-16 DIAGNOSIS — E785 Hyperlipidemia, unspecified: Secondary | ICD-10-CM | POA: Insufficient documentation

## 2017-04-16 DIAGNOSIS — Z86711 Personal history of pulmonary embolism: Secondary | ICD-10-CM | POA: Diagnosis not present

## 2017-04-16 DIAGNOSIS — D123 Benign neoplasm of transverse colon: Secondary | ICD-10-CM | POA: Insufficient documentation

## 2017-04-16 DIAGNOSIS — I10 Essential (primary) hypertension: Secondary | ICD-10-CM | POA: Diagnosis not present

## 2017-04-16 HISTORY — PX: COLONOSCOPY WITH PROPOFOL: SHX5780

## 2017-04-16 HISTORY — PX: POLYPECTOMY: SHX5525

## 2017-04-16 SURGERY — COLONOSCOPY WITH PROPOFOL
Anesthesia: Monitor Anesthesia Care

## 2017-04-16 MED ORDER — CHLORHEXIDINE GLUCONATE CLOTH 2 % EX PADS: 6.0000 | MEDICATED_PAD | Freq: Once | CUTANEOUS | Status: DC

## 2017-04-16 MED ORDER — MIDAZOLAM HCL 2 MG/2ML IJ SOLN
INTRAMUSCULAR | Status: AC
  Filled 2017-04-16: qty 2

## 2017-04-16 MED ORDER — PROPOFOL 500 MG/50ML IV EMUL
INTRAVENOUS | Status: DC | PRN
  Administered 2017-04-16: 75 ug/kg/min via INTRAVENOUS

## 2017-04-16 MED ORDER — PEG 3350-KCL-NA BICARB-NACL 420 G PO SOLR
4000.0000 mL | Freq: Once | ORAL | Status: AC
  Administered 2017-04-16: 4000 mL via ORAL
  Filled 2017-04-16: qty 4000

## 2017-04-16 MED ORDER — MIDAZOLAM HCL 2 MG/2ML IJ SOLN: 1.0000 mg | INTRAMUSCULAR | Status: AC

## 2017-04-16 MED ORDER — FENTANYL CITRATE (PF) 100 MCG/2ML IJ SOLN: 25.0000 ug | Freq: Once | INTRAMUSCULAR | Status: DC

## 2017-04-16 MED ORDER — MIDAZOLAM HCL 5 MG/5ML IJ SOLN
INTRAMUSCULAR | Status: DC | PRN
  Administered 2017-04-16 (×2): 2 mg via INTRAVENOUS

## 2017-04-16 MED ORDER — LACTATED RINGERS IV SOLN
INTRAVENOUS | Status: DC
  Administered 2017-04-16: 15:00:00 via INTRAVENOUS

## 2017-04-16 MED ORDER — PROPOFOL 10 MG/ML IV BOLUS
INTRAVENOUS | Status: AC
  Filled 2017-04-16: qty 20

## 2017-04-16 NOTE — Op Note (Addendum)
[facility] Patient Name: Dustin Medina Procedure Date: 04/16/2017 1:09 PM MRN: [id number] Date of Birth: Oct 18, 1950 Attending MD: Norvel Richards , MD CSN: [id number] Age: 67 Admit Type: Outpatient Procedure:                Colonoscopy Indications:              Screening for colorectal malignant neoplasm, High                            risk colon cancer surveillance: Personal history of                            colonic polyps Providers:                Norvel Richards, MD, Janeece Riggers, RN, Nelma Rothman, Technician Referring MD:              Medicines:                Propofol per Anesthesia Complications:            No immediate complications. Estimated Blood Loss:     Estimated blood loss was minimal. Procedure:                Pre-Anesthesia Assessment:                           - Prior to the procedure, a History and Physical                            was performed, and patient medications and                            allergies were reviewed. The patient's tolerance of                            previous anesthesia was also reviewed. The risks                            and benefits of the procedure and the sedation                            options and risks were discussed with the patient.                            All questions were answered, and informed consent                            was obtained. Prior Anticoagulants: The patient has                            taken no previous anticoagulant or antiplatelet                            agents. ASA  Grade Assessment: III - A patient with                            severe systemic disease. After reviewing the risks                            and benefits, the patient was deemed in                            satisfactory condition to undergo the procedure.                           After obtaining informed consent, the colonoscope                            was passed under direct  vision. Throughout the                            procedure, the patient's blood pressure, pulse, and                            oxygen saturations were monitored continuously. The                            EC-[id number] ([id number]) scope was introduced through                            the and advanced to the the cecum, identified by                            appendiceal orifice and ileocecal valve. The                            colonoscopy was performed without difficulty. The                            patient tolerated the procedure well. The quality                            of the bowel preparation was adequate. The                            colonoscopy was performed without difficulty. The                            patient tolerated the procedure well. The entire                            colon was well visualized. The ileocecal valve,                            appendiceal orifice, and rectum were photographed. Scope In: 1:66:06 PM Scope Out: 3:17:35 PM Scope Withdrawal Time: 0 hours 8 minutes 26 seconds  Total Procedure Duration:  0 hours 20 minutes 36 seconds  Findings:      The perianal and digital rectal examinations were normal. Redundant,       capacious colon.      A 3 mm polyp was found in the hepatic flexure. The polyp was sessile.       The polyp was removed with a cold snare. Resection and retrieval were       complete.      The exam was otherwise without abnormality on direct and retroflexion       views. Impression:               - One 3 mm polyp at the hepatic flexure, removed                            with a cold snare. Resected and retrieved.                            Redundant, capacious colon. Moderate Sedation:      Moderate (conscious) sedation was personally administered by an       anesthesia professional. The following parameters were monitored: oxygen       saturation, heart rate, blood pressure, respiratory rate, EKG, adequacy       of pulmonary  ventilation, and response to care. Total physician       intraservice time was 22 minutes. Recommendation:           - Patient has a contact number available for                            emergencies. The signs and symptoms of potential                            delayed complications were discussed with the                            patient. Return to normal activities tomorrow.                            Written discharge instructions were provided to the                            patient.                           - Resume previous diet.                           - Continue present medications.                           - Await pathology results.                           - Repeat colonoscopy date to be determined after                            pending pathology results are reviewed for  surveillance.                           - Return to GI clinic (date not yet determined). Procedure Code(s):        --- Professional ---                           [phone], Colonoscopy, flexible; with removal of                            tumor(s), polyp(s), or other lesion(s) by snare                            technique Diagnosis Code(s):        --- Professional ---                           Z12.11, Encounter for screening for malignant                            neoplasm of colon                           Z86.010, Personal history of colonic polyps                           D12.3, Benign neoplasm of transverse colon (hepatic                            flexure or splenic flexure) CPT copyright 2016 American Medical Association. All rights reserved. The codes documented in this report are preliminary and upon coder review may  be revised to meet current compliance requirements. Cristopher Estimable. Sherree Shankman, MD Norvel Richards, MD 04/16/2017 3:27:04 PM This report has been signed electronically. Number of Addenda: 0

## 2017-04-16 NOTE — Interval H&P Note (Signed)
History and Physical Interval Note:  04/16/2017 1:44 PM  Dustin Medina  has presented today for surgery, with the diagnosis of screening colonoscopy  The various methods of treatment have been discussed with the patient and family. After consideration of risks, benefits and other options for treatment, the patient has consented to  Procedure(s) with comments: COLONOSCOPY WITH PROPOFOL (N/A) - 11:15am as a surgical intervention .  The patient's history has been reviewed, patient examined, no change in status, stable for surgery.  I have reviewed the patient's chart and labs.  Questions were answered to the patient's satisfaction.     Manus Rudd
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
## 2017-04-16 NOTE — Anesthesia Postprocedure Evaluation (Signed)
Anesthesia Post Note  Patient: Dustin Medina  Procedure(s) Performed: COLONOSCOPY WITH PROPOFOL (N/A ) POLYPECTOMY  Patient location during evaluation: PACU Anesthesia Type: MAC Level of consciousness: awake and patient cooperative Pain management: pain level controlled Vital Signs Assessment: post-procedure vital signs reviewed and stable Respiratory status: spontaneous breathing, nonlabored ventilation and respiratory function stable Cardiovascular status: blood pressure returned to baseline Postop Assessment: no apparent nausea or vomiting Anesthetic complications: no     Last Vitals:  Vitals:   04/16/17 0950 04/16/17 1442  BP: (!) 149/89   Pulse: 78 75  Resp: 20 13  Temp: 36.7 C 36.4 C  SpO2: 100% 97%    Last Pain:  Vitals:   04/16/17 1442  TempSrc: Oral                 Camree Wigington J

## 2017-04-16 NOTE — Discharge Instructions (Signed)
Colonoscopy Discharge Instructions  Read the instructions outlined below and refer to this sheet in the next few weeks. These discharge instructions provide you with general information on caring for yourself after you leave the hospital. Your doctor may also give you specific instructions. While your treatment has been planned according to the most current medical practices available, unavoidable complications occasionally occur. If you have any problems or questions after discharge, call Dr. Gala Romney at [phone]. ACTIVITY  You may resume your regular activity, but move at a slower pace for the next 24 hours.   Take frequent rest periods for the next 24 hours.   Walking will help get rid of the air and reduce the bloated feeling in your belly (abdomen).   No driving for 24 hours (because of the medicine (anesthesia) used during the test).    Do not sign any important legal documents or operate any machinery for 24 hours (because of the anesthesia used during the test).  NUTRITION  Drink plenty of fluids.   You may resume your normal diet as instructed by your doctor.   Begin with a light meal and progress to your normal diet. Heavy or fried foods are harder to digest and may make you feel sick to your stomach (nauseated).   Avoid alcoholic beverages for 24 hours or as instructed.  MEDICATIONS  You may resume your normal medications unless your doctor tells you otherwise.  WHAT YOU CAN EXPECT TODAY  Some feelings of bloating in the abdomen.   Passage of more gas than usual.   Spotting of blood in your stool or on the toilet paper.  IF YOU HAD POLYPS REMOVED DURING THE COLONOSCOPY:  No aspirin products for 7 days or as instructed.   No alcohol for 7 days or as instructed.   Eat a soft diet for the next 24 hours.  FINDING OUT THE RESULTS OF YOUR TEST Not all test results are available during your visit. If your test results are not back during the visit, make an appointment  with your caregiver to find out the results. Do not assume everything is normal if you have not heard from your caregiver or the medical facility. It is important for you to follow up on all of your test results.  SEEK IMMEDIATE MEDICAL ATTENTION IF:  You have more than a spotting of blood in your stool.   Your belly is swollen (abdominal distention).   You are nauseated or vomiting.   You have a temperature over 101.   You have abdominal pain or discomfort that is severe or gets worse throughout the day.    Polyp information provided  Further recommendations to follow pending review of pathology report.     Colon Polyps Polyps are tissue growths inside the body. Polyps can grow in many places, including the large intestine (colon). A polyp may be a round bump or a mushroom-shaped growth. You could have one polyp or several. Most colon polyps are noncancerous (benign). However, some colon polyps can become cancerous over time. What are the causes? The exact cause of colon polyps is not known. What increases the risk? This condition is more likely to develop in people who:  Have a family history of colon cancer or colon polyps.  Are older than 33 or older than 45 if they are African American.  Have inflammatory bowel disease, such as ulcerative colitis or Crohn disease.  Are overweight.  Smoke cigarettes.  Do not get enough exercise.  Drink too much  alcohol.  Eat a diet that is: ? High in fat and red meat. ? Low in fiber.  Had childhood cancer that was treated with abdominal radiation.  What are the signs or symptoms? Most polyps do not cause symptoms. If you have symptoms, they may include:  Blood coming from your rectum when having a bowel movement.  Blood in your stool.The stool may look dark red or black.  A change in bowel habits, such as constipation or diarrhea.  How is this diagnosed? This condition is diagnosed with a colonoscopy. This is a  procedure that uses a lighted, flexible scope to look at the inside of your colon. How is this treated? Treatment for this condition involves removing any polyps that are found. Those polyps will then be tested for cancer. If cancer is found, your health care provider will talk to you about options for colon cancer treatment. Follow these instructions at home: Diet  Eat plenty of fiber, such as fruits, vegetables, and whole grains.  Eat foods that are high in calcium and vitamin D, such as milk, cheese, yogurt, eggs, liver, fish, and broccoli.  Limit foods high in fat, red meats, and processed meats, such as hot dogs, sausage, bacon, and lunch meats.  Maintain a healthy weight, or lose weight if recommended by your health care provider. General instructions  Do not smoke cigarettes.  Do not drink alcohol excessively.  Keep all follow-up visits as told by your health care provider. This is important. This includes keeping regularly scheduled colonoscopies. Talk to your health care provider about when you need a colonoscopy.  Exercise every day or as told by your health care provider. Contact a health care provider if:  You have new or worsening bleeding during a bowel movement.  You have new or increased blood in your stool.  You have a change in bowel habits.  You unexpectedly lose weight. This information is not intended to replace advice given to you by your health care provider. Make sure you discuss any questions you have with your health care provider. Document Released: 10/31/2003 Document Revised: 07/12/2015 Document Reviewed: 12/25/2014 Elsevier Interactive Patient Education  2018 [place] POST-ANESTHESIA  IMMEDIATELY FOLLOWING SURGERY:  Do not drive or operate machinery for the first twenty four hours after surgery.  Do not make any important decisions for twenty four hours after surgery or while taking narcotic pain medications or  sedatives.  If you develop intractable nausea and vomiting or a severe headache please notify your doctor immediately.  FOLLOW-UP:  Please make an appointment with your surgeon as instructed. You do not need to follow up with anesthesia unless specifically instructed to do so.  WOUND CARE INSTRUCTIONS (if applicable):  Keep a dry clean dressing on the anesthesia/puncture wound site if there is drainage.  Once the wound has quit draining you may leave it open to air.  Generally you should leave the bandage intact for twenty four hours unless there is drainage.  If the epidural site drains for more than 36-48 hours please call the anesthesia department.  QUESTIONS?:  Please feel free to call your physician or the hospital operator if you have any questions, and they will be happy to assist you.

## 2017-04-16 NOTE — Transfer of Care (Signed)
Immediate Anesthesia Transfer of Care Note  Patient: Dustin Medina  Procedure(s) Performed: COLONOSCOPY WITH PROPOFOL (N/A ) POLYPECTOMY  Patient Location: PACU  Anesthesia Type:MAC  Level of Consciousness: awake and patient cooperative  Airway & Oxygen Therapy: Patient Spontanous Breathing and Patient connected to face mask oxygen  Post-op Assessment: Report given to RN and Post -op Vital signs reviewed and stable  Post vital signs: Reviewed and stable  Last Vitals:  Vitals:   04/16/17 0950 04/16/17 1442  BP: (!) 149/89   Pulse: 78 75  Resp: 20 13  Temp: 36.7 C 36.4 C  SpO2: 100% 97%    Last Pain:  Vitals:   04/16/17 1442  TempSrc: Oral      Patients Stated Pain Goal: 4 (16/55/37 4827)  Complications: No apparent anesthesia complications

## 2017-04-16 NOTE — Interval H&P Note (Signed)
History and Physical Interval Note:  04/16/2017 1:44 PM  Dustin Medina  has presented today for surgery, with the diagnosis of screening colonoscopy  The various methods of treatment have been discussed with the patient and family. After consideration of risks, benefits and other options for treatment, the patient has consented to  Procedure(s) with comments: COLONOSCOPY WITH PROPOFOL (N/A) - 11:15am as a surgical intervention .  The patient's history has been reviewed, patient examined, no change in status, stable for surgery.  I have reviewed the patient's chart and labs.  Questions were answered to the patient's satisfaction.     Dustin Medina     Patient seen and examined. Presented with inadequate prep this morning; he's been reprepped. Plan for screening colonoscopy this afternoon per plan.  The risks, benefits, limitations, alternatives and imponderables have been reviewed with the patient. Questions have been answered. All parties are agreeable.

## 2017-04-20 ENCOUNTER — Encounter (HOSPITAL_COMMUNITY): Payer: Self-pay | Admitting: Internal Medicine

## 2017-04-22 ENCOUNTER — Encounter: Payer: Self-pay | Admitting: Internal Medicine

## 2017-04-27 ENCOUNTER — Telehealth: Payer: Self-pay | Admitting: Internal Medicine

## 2017-04-27 NOTE — Telephone Encounter (Signed)
Pt notified of results. Pt has some small ? Fissures around the anus that drain. No pain or bleeding. Pt has used Vaseline as a barrier. Pt would like to know if he could try something else?  Pt would also like to know the amount of polyps her had removed?

## 2017-04-27 NOTE — Telephone Encounter (Signed)
He probably needs that evaluated. History of chronic sacral wound. There was no mention of fissures at time of colonoscopy. If he is having rectal discomfort, we can do anusol cream twice a day per rectum. What he is describing sounds like it needs to be evaluated as it's not clear what is going on.   He had one polyp removed that was precancerous (tubular adenoma) and needs another colonoscopy in 5 years.

## 2017-04-27 NOTE — Telephone Encounter (Signed)
Pt had colonoscopy on 3/4 and was asking about his results. Please call him at [phone]

## 2017-04-28 NOTE — Telephone Encounter (Signed)
Will try calling pt again. Number was unavailable when dialed twice.

## 2017-04-29 DIAGNOSIS — N390 Urinary tract infection, site not specified: Secondary | ICD-10-CM | POA: Diagnosis not present

## 2017-04-29 DIAGNOSIS — N319 Neuromuscular dysfunction of bladder, unspecified: Secondary | ICD-10-CM | POA: Diagnosis not present

## 2017-04-29 NOTE — Telephone Encounter (Signed)
Closing note out. Tried calling pt and phone is not in service. LM with emergency contact, pt can call back if still having problems.

## 2017-05-01 ENCOUNTER — Telehealth: Payer: Self-pay | Admitting: Nurse Practitioner

## 2017-05-04 ENCOUNTER — Ambulatory Visit (INDEPENDENT_AMBULATORY_CARE_PROVIDER_SITE_OTHER): Payer: PPO | Admitting: Nurse Practitioner

## 2017-05-04 ENCOUNTER — Encounter: Payer: Self-pay | Admitting: Nurse Practitioner

## 2017-05-04 ENCOUNTER — Ambulatory Visit: Payer: PPO | Admitting: Nurse Practitioner

## 2017-05-04 VITALS — BP 148/74 | HR 103 | Temp 97.0°F

## 2017-05-04 DIAGNOSIS — I10 Essential (primary) hypertension: Secondary | ICD-10-CM

## 2017-05-04 DIAGNOSIS — M545 Low back pain, unspecified: Secondary | ICD-10-CM

## 2017-05-04 MED ORDER — VALSARTAN-HYDROCHLOROTHIAZIDE 320-25 MG PO TABS: 1.0000 | ORAL_TABLET | Freq: Every day | ORAL | 1 refills | Status: DC
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
## 2017-05-04 NOTE — Patient Instructions (Signed)

## 2017-05-04 NOTE — Addendum Note (Signed)
Addended by: Marylin Crosby on: 05/04/2017 04:53 PM   Modules accepted: Orders

## 2017-05-11 DIAGNOSIS — Z0289 Encounter for other administrative examinations: Secondary | ICD-10-CM

## 2017-05-18 ENCOUNTER — Telehealth: Payer: Self-pay | Admitting: Internal Medicine

## 2017-05-18 ENCOUNTER — Telehealth: Payer: Self-pay | Admitting: Nurse Practitioner

## 2017-05-18 NOTE — Telephone Encounter (Signed)
[place] WITH PROCEDURE RESULTS

## 2017-05-18 NOTE — Telephone Encounter (Signed)
Will call pt back. Pt isn't at home per pts mother.

## 2017-05-19 NOTE — Telephone Encounter (Signed)
lmtcb

## 2017-05-20 ENCOUNTER — Telehealth: Payer: Self-pay | Admitting: Internal Medicine

## 2017-05-20 NOTE — Telephone Encounter (Signed)
AB pt called back today and said his wound on his rectum is still having some clear drainage. It sounds like he gets some irritation when straining due to constipation. Pt notices only a small amount of blood when wiping, if hes been constipated. Pt applies otc Aquaphor ointment and has applied Vicks Vapor Rub if he was out of Aquaphor. In the previous note, it was mentioned that pt can try Anusol cr. Is Anusol a good option for pt to try?

## 2017-05-20 NOTE — Telephone Encounter (Signed)
Pt was calling for colonoscopy results. [phone]

## 2017-05-21 NOTE — Telephone Encounter (Signed)
lmtcb

## 2017-05-21 NOTE — Telephone Encounter (Signed)
Lmom, waiting on a return call.  

## 2017-05-21 NOTE — Telephone Encounter (Signed)
lmovm that Rx was faxed to [facility]

## 2017-05-21 NOTE — Telephone Encounter (Signed)
Yes, may use Anusol cream BID per rectum. He needs to be evaluated due to history of sacral wound. Needs appt with [place] as well so we can examine. May call in Anusol cream take BID, disp #1 tube with 1 refill.

## 2017-05-24 ENCOUNTER — Other Ambulatory Visit: Payer: Self-pay | Admitting: Nurse Practitioner

## 2017-05-25 NOTE — Telephone Encounter (Signed)
Last lipid 08/02/15  MMM

## 2017-05-28 NOTE — Telephone Encounter (Signed)
Tried calling pt. LM on emergency contact.

## 2017-06-01 ENCOUNTER — Other Ambulatory Visit: Payer: Self-pay

## 2017-06-01 MED ORDER — HYDROCORTISONE 2.5 % RE CREA: 1.0000 "application " | TOPICAL_CREAM | Freq: Two times a day (BID) | RECTAL | 1 refills | Status: DC

## 2017-06-01 NOTE — Telephone Encounter (Signed)
letter was mailed to pt 

## 2017-06-01 NOTE — Telephone Encounter (Signed)
Tried reaching pt. HC 2.5% cream was sent to pts pharmacy.

## 2017-06-10 ENCOUNTER — Telehealth: Payer: Self-pay

## 2017-06-10 NOTE — Telephone Encounter (Signed)
Pt called and stated he received the letter sent to him stating we were trying to get in contact with him. Pt is aware that his medication was called into his pharmacy and his issue is improving that was previously discussed with with buttock area.

## 2017-06-16 DIAGNOSIS — N319 Neuromuscular dysfunction of bladder, unspecified: Secondary | ICD-10-CM | POA: Diagnosis not present

## 2017-06-16 DIAGNOSIS — N390 Urinary tract infection, site not specified: Secondary | ICD-10-CM | POA: Diagnosis not present

## 2017-07-10 ENCOUNTER — Telehealth: Payer: Self-pay | Admitting: Nurse Practitioner

## 2017-07-10 DIAGNOSIS — G8220 Paraplegia, unspecified: Secondary | ICD-10-CM

## 2017-07-10 NOTE — Telephone Encounter (Signed)
What is the name of the medication? Tens unit itself to get pads and unit togetther  Have you contacted your pharmacy to request a refill? NO  Which pharmacy would you like this sent to? [facility]   Patient notified that their request is being sent to the clinical staff for review and that they should receive a call once it is complete. If they do not receive a call within 24 hours they can check with their pharmacy or our office.

## 2017-07-14 ENCOUNTER — Other Ambulatory Visit: Payer: Self-pay | Admitting: Nurse Practitioner

## 2017-07-14 NOTE — Telephone Encounter (Signed)
I set up two scripts. One for a unit and one for electrodes. This is the closest thing I could find. See what you think.

## 2017-07-14 NOTE — Telephone Encounter (Signed)
Can you write a rx for tens unit? Please advise

## 2017-07-14 NOTE — Telephone Encounter (Signed)
I am trying to figure out how to write rx

## 2017-07-16 ENCOUNTER — Ambulatory Visit (INDEPENDENT_AMBULATORY_CARE_PROVIDER_SITE_OTHER): Payer: PPO | Admitting: Nurse Practitioner

## 2017-07-16 ENCOUNTER — Encounter: Payer: Self-pay | Admitting: Nurse Practitioner

## 2017-07-16 VITALS — BP 163/86 | HR 110 | Temp 97.3°F

## 2017-07-16 DIAGNOSIS — S91002A Unspecified open wound, left ankle, initial encounter: Secondary | ICD-10-CM | POA: Diagnosis not present

## 2017-07-16 MED ORDER — PRO COMFORT TENS UNIT DEVI: 1.0000 [IU] | Freq: Every day | 0 refills | Status: DC | PRN

## 2017-07-16 MED ORDER — CIPROFLOXACIN HCL 500 MG PO TABS: 500.0000 mg | ORAL_TABLET | Freq: Two times a day (BID) | ORAL | 0 refills | Status: DC

## 2017-07-16 MED ORDER — PRO COMFORT TENS ELECTRODES MISC: 2.0000 | Freq: Every day | 11 refills | Status: AC | PRN

## 2017-07-16 NOTE — Patient Instructions (Signed)
How to Change Your Dressing A dressing is a material that is placed in and over wounds. A dressing helps your wound to heal by protecting it from:  Bacteria.  Worse injury.  Being too dry or too wet.  What are the risks? The sticky (adhesive) tape that is used with a dressing may make your skin sore or irritated, or it may cause a rash. These are the most common problems. However, more serious problems can develop, such as:  Bleeding.  Infection.  How to change your dressing Getting Ready to Change Your Dressing   Take a shower before you do the first dressing change of the day. If your doctor does not want your wound to get wet and your dressing is not waterproof, you may need to put plastic leak-proof sealing wrap on your dressing to protect it.  If needed, take pain medicine as told by your doctor 30 minutes before you change your dressing.  Set up a clean station for wound care. You will need: ? A plastic trash bag that is open and ready to use. ? Hand sanitizer. ? Wound cleanser or salt-water solution (saline) as told by your doctor. ? New dressing material or bandages. Make sure to open the dressing package so the dressing stays on the inside of the package. You may also need these supplies in your clean station:  A box of vinyl gloves.  Tape.  Skin protectant. This may be a wipe, film, or spray.  Clean or germ-free (sterile) scissors.  A cotton-tipped applicator.  Taking Off Your Old Dressing  Wash your hands with soap and water. Dry your hands with a clean towel. If you cannot use soap and water, use hand sanitizer.  If you are using gloves, put on the gloves before you take off the dressing.  Gently take off any adhesive or tape by pulling it off in the direction of your hair growth. Only touch the outside edges of the dressing.  Take off the dressing. If the dressing sticks to your skin, wet the dressing with a germ-free salt-water solution. This helps it  come off more easily.  Take off any gauze or packing in your wound.  Throw the old dressing supplies into the ready trash bag.  Take off your gloves. To take off each glove, grab the cuff with your other hand and turn the glove inside out. Put the gloves in the trash right away.  Wash your hands with soap and water. Dry your hands with a clean towel. If you cannot use soap and water, use hand sanitizer. Cleaning Your Wound  Follow instructions from your doctor about how to clean your wound. This may include using a salt-water solution or recommended wound cleanser.  Do not use over-the-counter medicated or antiseptic creams, sprays, liquids, or dressings unless your doctor tells you to do that.  Use a clean gauze pad to clean the area fully with the salt-water solution or wound cleanser that your doctor recommends.  Throw the gauze pad into the trash bag.  Wash your hands with soap and water. Dry your hands with a clean towel. If you cannot use soap and water, use hand sanitizer. Putting on the Dressing  If your doctor recommended a skin protectant, put it on the skin around the wound.  Cover the wound with the recommended dressing, such as a nonstick gauze or bandage. Make sure to touch only the outside edges of the dressing. Do not touch the inside of the dressing.    Attach the dressing so all sides stay in place. You may do this with the attached medical adhesive, roll gauze, or tape. If you use tape, do not wrap the tape all the way around your arm or leg.  Take off your gloves. Put them in the trash bag with the old dressing. Tie the bag shut and throw it away.  Wash your hands with soap and water. Dry your hands with a clean towel. If you cannot use soap and water, use hand sanitizer. Get help if:   You have new pain.  You have irritation, a rash, or itching around the wound or dressing.  Changing your dressing is painful.  Changing your dressing causes a lot of  bleeding. Get help right away if:  You have very bad pain.  You have signs of infection, such as: ? More redness, swelling, or pain. ? More fluid or blood. ? Warmth. ? Pus or a bad smell. ? Red streaks leading from wound. ? A fever. This information is not intended to replace advice given to you by your health care provider. Make sure you discuss any questions you have with your health care provider. Document Released: 05/02/2008 Document Revised: 07/12/2015 Document Reviewed: 11/09/2014 Elsevier Interactive Patient Education  2018 [employer] Inc[employer]  

## 2017-07-16 NOTE — Progress Notes (Signed)
   Subjective:    Patient ID: FREDRIK MOGEL, male    DOB: 12-29-1950, 67 y.o.   MRN: [id number]   Chief Complaint: left ankle sore   HPI Patient come sin with open sore on left ankle. He has had one there in the past and says it completely healed. He said he pulled what he thought was a piece of dead skin off of it and now it is draining.   Review of Systems  Skin: Positive for wound (left lateral ankle).  All other systems reviewed and are negative.      Objective:   Physical Exam  Constitutional: He appears well-developed and well-nourished.  Cardiovascular: Normal rate.  Pulmonary/Chest: Effort normal.  Skin: Skin is warm.  5cm annular open wound left ankle           Assessment & Plan:  [name] in today with chief complaint of left ankle sore   1. Ankle wound, left, initial encounter keep covered Meds ordered this encounter  Medications  . ciprofloxacin (CIPRO) 500 MG tablet    Sig: Take 1 tablet (500 mg total) by mouth 2 (two) times daily.    Dispense:  20 tablet    Refill:  0    Order Specific Question:   Supervising Provider    Answer:   Levan referral to wound care center  [place], FNP

## 2017-07-23 ENCOUNTER — Other Ambulatory Visit: Payer: Self-pay

## 2017-07-23 ENCOUNTER — Encounter (HOSPITAL_COMMUNITY): Payer: Self-pay | Admitting: Physical Therapy

## 2017-07-23 ENCOUNTER — Ambulatory Visit (HOSPITAL_COMMUNITY): Payer: PPO | Attending: Nurse Practitioner | Admitting: Physical Therapy

## 2017-07-23 DIAGNOSIS — L89524 Pressure ulcer of left ankle, stage 4: Secondary | ICD-10-CM | POA: Diagnosis not present

## 2017-07-23 DIAGNOSIS — L89154 Pressure ulcer of sacral region, stage 4: Secondary | ICD-10-CM | POA: Diagnosis not present

## 2017-07-23 DIAGNOSIS — N319 Neuromuscular dysfunction of bladder, unspecified: Secondary | ICD-10-CM | POA: Diagnosis not present

## 2017-07-23 DIAGNOSIS — N390 Urinary tract infection, site not specified: Secondary | ICD-10-CM | POA: Diagnosis not present
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
## 2017-07-24 ENCOUNTER — Telehealth (HOSPITAL_COMMUNITY): Payer: Self-pay

## 2017-07-24 NOTE — Telephone Encounter (Signed)
Pt confirmed he would take the apptment if I had an opening on Friday when he cx 6/11. Tried to call him back to confirm and the line was always busy. NF 07/24/17

## 2017-07-27 ENCOUNTER — Ambulatory Visit (HOSPITAL_COMMUNITY): Payer: PPO

## 2017-07-27 ENCOUNTER — Telehealth: Payer: Self-pay | Admitting: Nurse Practitioner

## 2017-07-27 ENCOUNTER — Telehealth (HOSPITAL_COMMUNITY): Payer: Self-pay

## 2017-07-27 NOTE — Telephone Encounter (Signed)
I attempted to call Mr. Hur on his home/mobile phone number on file to check in as he missed his 10:30 AM appointment. His phone line was busy and I was unable to leave a message. I will call back at a later time this date to remind him of his next appointment on 07/30/17 at 1:00 PM.  Kipp Brood, PT, DPT Physical Therapist with [facility]  07/27/2017 10:50 AM

## 2017-07-27 NOTE — Telephone Encounter (Signed)
Attempted to contact patient twice- line busy

## 2017-07-28 ENCOUNTER — Ambulatory Visit (HOSPITAL_COMMUNITY): Payer: PPO

## 2017-07-30 ENCOUNTER — Ambulatory Visit (HOSPITAL_COMMUNITY): Payer: PPO

## 2017-07-30 ENCOUNTER — Encounter (HOSPITAL_COMMUNITY): Payer: Self-pay

## 2017-07-30 ENCOUNTER — Other Ambulatory Visit: Payer: Self-pay

## 2017-07-30 DIAGNOSIS — L89524 Pressure ulcer of left ankle, stage 4: Secondary | ICD-10-CM | POA: Diagnosis not present
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
## 2017-08-03 ENCOUNTER — Ambulatory Visit (HOSPITAL_COMMUNITY): Payer: PPO

## 2017-08-03 ENCOUNTER — Other Ambulatory Visit: Payer: Self-pay

## 2017-08-03 ENCOUNTER — Encounter (HOSPITAL_COMMUNITY): Payer: Self-pay

## 2017-08-03 DIAGNOSIS — L89524 Pressure ulcer of left ankle, stage 4: Secondary | ICD-10-CM
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
## 2017-08-05 ENCOUNTER — Encounter (HOSPITAL_COMMUNITY): Payer: Self-pay | Admitting: Physical Therapy

## 2017-08-05 ENCOUNTER — Other Ambulatory Visit: Payer: Self-pay

## 2017-08-05 ENCOUNTER — Ambulatory Visit (HOSPITAL_COMMUNITY): Payer: PPO | Admitting: Physical Therapy

## 2017-08-05 DIAGNOSIS — L89524 Pressure ulcer of left ankle, stage 4: Secondary | ICD-10-CM | POA: Diagnosis not present
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
## 2017-08-10 ENCOUNTER — Ambulatory Visit (HOSPITAL_COMMUNITY): Payer: PPO | Admitting: Physical Therapy

## 2017-08-10 DIAGNOSIS — L89524 Pressure ulcer of left ankle, stage 4: Secondary | ICD-10-CM

## 2017-08-10 DIAGNOSIS — L89154 Pressure ulcer of sacral region, stage 4: Secondary | ICD-10-CM
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
## 2017-08-11 ENCOUNTER — Telehealth: Payer: Self-pay | Admitting: Nurse Practitioner

## 2017-08-12 ENCOUNTER — Ambulatory Visit (HOSPITAL_COMMUNITY): Payer: PPO | Admitting: Physical Therapy

## 2017-08-12 DIAGNOSIS — L89524 Pressure ulcer of left ankle, stage 4: Secondary | ICD-10-CM
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
## 2017-08-13 ENCOUNTER — Telehealth (HOSPITAL_COMMUNITY): Payer: Self-pay | Admitting: Physical Therapy

## 2017-08-13 ENCOUNTER — Ambulatory Visit (HOSPITAL_COMMUNITY): Payer: PPO | Admitting: Physical Therapy

## 2017-08-13 NOTE — Telephone Encounter (Signed)
L/m to cx today since we worked him in yesterday. NF

## 2017-08-13 NOTE — Telephone Encounter (Signed)
Pt called back to confrim he understood that his apptment was cx.

## 2017-08-17 ENCOUNTER — Other Ambulatory Visit: Payer: Self-pay | Admitting: Family Medicine

## 2017-08-17 ENCOUNTER — Other Ambulatory Visit: Payer: PPO

## 2017-08-17 DIAGNOSIS — R300 Dysuria: Secondary | ICD-10-CM | POA: Diagnosis not present

## 2017-08-17 DIAGNOSIS — N3000 Acute cystitis without hematuria: Secondary | ICD-10-CM | POA: Diagnosis not present

## 2017-08-17 LAB — URINALYSIS, COMPLETE
BILIRUBIN UA: NEGATIVE
Glucose, UA: NEGATIVE
Ketones, UA: NEGATIVE
Nitrite, UA: NEGATIVE
Protein, UA: NEGATIVE
Specific Gravity, UA: 1.02 (ref 1.005–1.030)
Urobilinogen, Ur: 0.2 mg/dL (ref 0.2–1.0)
pH, UA: 6.5 (ref 5.0–7.5)

## 2017-08-17 LAB — MICROSCOPIC EXAMINATION: Renal Epithel, UA: NONE SEEN /hpf

## 2017-08-17 NOTE — Progress Notes (Signed)
UA placed for [facility]'s patient.

## 2017-08-18 ENCOUNTER — Other Ambulatory Visit: Payer: Self-pay | Admitting: Nurse Practitioner

## 2017-08-18 ENCOUNTER — Ambulatory Visit (HOSPITAL_COMMUNITY): Payer: PPO | Attending: Nurse Practitioner | Admitting: Physical Therapy

## 2017-08-18 DIAGNOSIS — L89154 Pressure ulcer of sacral region, stage 4: Secondary | ICD-10-CM

## 2017-08-18 DIAGNOSIS — L89524 Pressure ulcer of left ankle, stage 4: Secondary | ICD-10-CM | POA: Diagnosis not present
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
## 2017-08-18 NOTE — Telephone Encounter (Signed)
Multiple attempts made to contact patient.  This encounter will now be closed  

## 2017-08-19 ENCOUNTER — Telehealth: Payer: Self-pay | Admitting: Nurse Practitioner

## 2017-08-19 LAB — URINE CULTURE

## 2017-08-21 ENCOUNTER — Other Ambulatory Visit: Payer: Self-pay

## 2017-08-21 ENCOUNTER — Ambulatory Visit (HOSPITAL_COMMUNITY): Payer: PPO | Admitting: Physical Therapy

## 2017-08-21 ENCOUNTER — Encounter (HOSPITAL_COMMUNITY): Payer: Self-pay | Admitting: Physical Therapy

## 2017-08-21 DIAGNOSIS — L89524 Pressure ulcer of left ankle, stage 4: Secondary | ICD-10-CM | POA: Diagnosis not present
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
## 2017-08-25 ENCOUNTER — Ambulatory Visit (HOSPITAL_COMMUNITY): Payer: PPO | Admitting: Physical Therapy

## 2017-08-25 ENCOUNTER — Encounter (HOSPITAL_COMMUNITY): Payer: Self-pay | Admitting: Physical Therapy

## 2017-08-25 DIAGNOSIS — L89524 Pressure ulcer of left ankle, stage 4: Secondary | ICD-10-CM
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
## 2017-08-27 ENCOUNTER — Other Ambulatory Visit (HOSPITAL_COMMUNITY)
Admission: AD | Admit: 2017-08-27 | Discharge: 2017-08-27 | Disposition: A | Discharge status: Home / Self Care | Payer: PPO | Source: Skilled Nursing Facility | Attending: Emergency Medicine | Admitting: Emergency Medicine

## 2017-08-27 ENCOUNTER — Other Ambulatory Visit: Payer: Self-pay

## 2017-08-27 ENCOUNTER — Encounter (HOSPITAL_COMMUNITY): Payer: Self-pay | Admitting: Physical Therapy

## 2017-08-27 ENCOUNTER — Ambulatory Visit (HOSPITAL_COMMUNITY): Payer: PPO | Admitting: Physical Therapy

## 2017-08-27 DIAGNOSIS — X58XXXD Exposure to other specified factors, subsequent encounter: Secondary | ICD-10-CM | POA: Diagnosis not present

## 2017-08-27 DIAGNOSIS — L89524 Pressure ulcer of left ankle, stage 4: Secondary | ICD-10-CM

## 2017-08-27 DIAGNOSIS — S91002D Unspecified open wound, left ankle, subsequent encounter: Secondary | ICD-10-CM | POA: Insufficient documentation
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
## 2017-08-30 LAB — AEROBIC CULTURE  (SUPERFICIAL SPECIMEN): CULTURE: NORMAL

## 2017-09-01 ENCOUNTER — Ambulatory Visit (HOSPITAL_COMMUNITY): Payer: PPO | Admitting: Physical Therapy

## 2017-09-01 DIAGNOSIS — L89524 Pressure ulcer of left ankle, stage 4: Secondary | ICD-10-CM
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
## 2017-09-03 ENCOUNTER — Ambulatory Visit (HOSPITAL_COMMUNITY): Payer: PPO | Admitting: Physical Therapy

## 2017-09-03 DIAGNOSIS — L89524 Pressure ulcer of left ankle, stage 4: Secondary | ICD-10-CM
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
## 2017-09-06 ENCOUNTER — Other Ambulatory Visit: Payer: Self-pay | Admitting: Nurse Practitioner

## 2017-09-06 DIAGNOSIS — S91002A Unspecified open wound, left ankle, initial encounter: Secondary | ICD-10-CM

## 2017-09-06 NOTE — Progress Notes (Signed)
Ref general surgeon

## 2017-09-07 ENCOUNTER — Encounter (HOSPITAL_COMMUNITY): Payer: Self-pay | Admitting: Physical Therapy

## 2017-09-07 ENCOUNTER — Other Ambulatory Visit: Payer: Self-pay

## 2017-09-07 ENCOUNTER — Ambulatory Visit (HOSPITAL_COMMUNITY): Payer: PPO | Admitting: Physical Therapy

## 2017-09-07 DIAGNOSIS — L89524 Pressure ulcer of left ankle, stage 4: Secondary | ICD-10-CM
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
## 2017-09-09 ENCOUNTER — Ambulatory Visit (HOSPITAL_COMMUNITY): Payer: PPO | Admitting: Physical Therapy

## 2017-09-09 ENCOUNTER — Telehealth (HOSPITAL_COMMUNITY): Payer: Self-pay | Admitting: Nurse Practitioner

## 2017-09-09 DIAGNOSIS — L89524 Pressure ulcer of left ankle, stage 4: Secondary | ICD-10-CM | POA: Diagnosis not present

## 2017-09-09 DIAGNOSIS — N319 Neuromuscular dysfunction of bladder, unspecified: Secondary | ICD-10-CM | POA: Diagnosis not present

## 2017-09-09 DIAGNOSIS — N390 Urinary tract infection, site not specified: Secondary | ICD-10-CM | POA: Diagnosis not present
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
## 2017-09-09 NOTE — Telephone Encounter (Signed)
7/24  lady left a messge that he would be 20 mins. late or possibly a little later than that.  I called her back and said that was fine... CR didn't have a 1:45 patient so she could work on him a little longer.

## 2017-09-14 ENCOUNTER — Telehealth: Payer: Self-pay | Admitting: *Deleted

## 2017-09-14 NOTE — Telephone Encounter (Signed)
Incoming call from pt stating he is unable to get Testosterone filled due to RX being on back order Confirmed with [facility] Please advise Pt also requesting refill on Trimpex

## 2017-09-14 NOTE — Telephone Encounter (Signed)
Unless wants to change testosterone all together.injectabke is on back order by manufactr=urer

## 2017-09-14 NOTE — Telephone Encounter (Signed)
ntbs for antibiotic.  See if pahrmacy has subsitute for testosterone.

## 2017-09-14 NOTE — Telephone Encounter (Signed)
Spoke to pharmacist and they state there isn't a substitute for the injectable testosterone and that it is on backorder through the manufacture.   Attempted to call pt regarding needing appt for refill on antibiotics, no answer so left message.

## 2017-09-15 ENCOUNTER — Ambulatory Visit (HOSPITAL_COMMUNITY): Payer: PPO | Admitting: Physical Therapy

## 2017-09-15 DIAGNOSIS — L89524 Pressure ulcer of left ankle, stage 4: Secondary | ICD-10-CM

## 2017-09-15 DIAGNOSIS — R972 Elevated prostate specific antigen [PSA]: Secondary | ICD-10-CM | POA: Diagnosis not present
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
## 2017-09-16 ENCOUNTER — Telehealth (HOSPITAL_COMMUNITY): Payer: Self-pay | Admitting: Nurse Practitioner
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
## 2017-09-17 ENCOUNTER — Ambulatory Visit (HOSPITAL_COMMUNITY): Payer: PPO | Admitting: Physical Therapy

## 2017-09-18 ENCOUNTER — Encounter

## 2017-09-22 ENCOUNTER — Ambulatory Visit (HOSPITAL_COMMUNITY): Payer: PPO | Attending: Nurse Practitioner

## 2017-09-22 ENCOUNTER — Encounter (HOSPITAL_COMMUNITY): Payer: Self-pay

## 2017-09-22 DIAGNOSIS — L89524 Pressure ulcer of left ankle, stage 4: Secondary | ICD-10-CM | POA: Diagnosis not present
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
## 2017-09-22 NOTE — Addendum Note (Signed)
Addended by: Leeroy Cha on: 09/22/2017 08:45 AM   Modules accepted: Orders

## 2017-09-25 ENCOUNTER — Ambulatory Visit (HOSPITAL_COMMUNITY): Payer: PPO

## 2017-09-25 ENCOUNTER — Other Ambulatory Visit: Payer: Self-pay

## 2017-09-25 ENCOUNTER — Encounter (HOSPITAL_COMMUNITY): Payer: Self-pay

## 2017-09-25 DIAGNOSIS — L89524 Pressure ulcer of left ankle, stage 4: Secondary | ICD-10-CM | POA: Diagnosis not present
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
## 2017-09-29 ENCOUNTER — Ambulatory Visit (INDEPENDENT_AMBULATORY_CARE_PROVIDER_SITE_OTHER): Payer: PPO | Admitting: General Surgery

## 2017-09-29 ENCOUNTER — Encounter (HOSPITAL_COMMUNITY): Payer: Self-pay

## 2017-09-29 ENCOUNTER — Ambulatory Visit (HOSPITAL_COMMUNITY): Payer: PPO

## 2017-09-29 ENCOUNTER — Encounter: Payer: Self-pay | Admitting: General Surgery

## 2017-09-29 VITALS — BP 149/82 | HR 86 | Temp 98.6°F | Resp 20

## 2017-09-29 DIAGNOSIS — L89524 Pressure ulcer of left ankle, stage 4: Secondary | ICD-10-CM

## 2017-09-29 DIAGNOSIS — L89502 Pressure ulcer of unspecified ankle, stage 2: Secondary | ICD-10-CM
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
## 2017-09-30 ENCOUNTER — Ambulatory Visit (HOSPITAL_COMMUNITY): Payer: PPO

## 2017-10-02 ENCOUNTER — Telehealth (HOSPITAL_COMMUNITY): Payer: Self-pay | Admitting: Nurse Practitioner

## 2017-10-02 ENCOUNTER — Encounter (HOSPITAL_COMMUNITY): Payer: Self-pay

## 2017-10-02 ENCOUNTER — Ambulatory Visit (HOSPITAL_COMMUNITY): Payer: PPO

## 2017-10-02 DIAGNOSIS — L89524 Pressure ulcer of left ankle, stage 4: Secondary | ICD-10-CM

## 2017-10-02 NOTE — Telephone Encounter (Signed)
10/02/17  Pt called and said that someone had called him.   Myriam Jacobson had called to see if he could come in earlier this morning and patient said he would like to but couldn't
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
## 2017-10-06 ENCOUNTER — Encounter (HOSPITAL_COMMUNITY): Payer: Self-pay | Admitting: Physical Therapy

## 2017-10-06 ENCOUNTER — Other Ambulatory Visit: Payer: Self-pay

## 2017-10-06 ENCOUNTER — Ambulatory Visit (HOSPITAL_COMMUNITY): Payer: PPO | Admitting: Physical Therapy

## 2017-10-06 DIAGNOSIS — L89524 Pressure ulcer of left ankle, stage 4: Secondary | ICD-10-CM

## 2017-10-06 DIAGNOSIS — N319 Neuromuscular dysfunction of bladder, unspecified: Secondary | ICD-10-CM | POA: Diagnosis not present

## 2017-10-06 DIAGNOSIS — N390 Urinary tract infection, site not specified: Secondary | ICD-10-CM | POA: Diagnosis not present
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
## 2017-10-09 ENCOUNTER — Encounter (HOSPITAL_COMMUNITY): Payer: Self-pay

## 2017-10-09 ENCOUNTER — Ambulatory Visit (HOSPITAL_COMMUNITY): Payer: PPO

## 2017-10-09 DIAGNOSIS — L89524 Pressure ulcer of left ankle, stage 4: Secondary | ICD-10-CM
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
## 2017-10-12 ENCOUNTER — Telehealth (HOSPITAL_COMMUNITY): Payer: Self-pay | Admitting: Nurse Practitioner

## 2017-10-12 ENCOUNTER — Telehealth (HOSPITAL_COMMUNITY): Payer: Self-pay | Admitting: Physical Therapy

## 2017-10-12 ENCOUNTER — Other Ambulatory Visit: Payer: Self-pay | Admitting: Nurse Practitioner

## 2017-10-12 DIAGNOSIS — L97329 Non-pressure chronic ulcer of left ankle with unspecified severity: Secondary | ICD-10-CM | POA: Diagnosis not present

## 2017-10-12 DIAGNOSIS — Z993 Dependence on wheelchair: Secondary | ICD-10-CM | POA: Diagnosis not present

## 2017-10-12 NOTE — Telephone Encounter (Signed)
10/12/17  pt called to cx said he was going to the dr 8/26 and would stop by and let us know what the dr said so he wanted to cx the appt.

## 2017-10-12 NOTE — Telephone Encounter (Signed)
to call Dr. Posey Pronto. I called Dr. Serita Grit office spoke to Kim-advised her we were tx pt for wound care. Kim talked to MD -pt was told to make a decision> Patient decided to continue care here. Kim closed referral from Dr. Arnoldo Morale.

## 2017-10-13 ENCOUNTER — Ambulatory Visit (HOSPITAL_COMMUNITY): Payer: PPO

## 2017-10-13 NOTE — Telephone Encounter (Signed)
Last seen 07/16/17  MMM

## 2017-10-15 ENCOUNTER — Ambulatory Visit (HOSPITAL_COMMUNITY): Payer: PPO

## 2017-10-15 ENCOUNTER — Encounter (HOSPITAL_COMMUNITY): Payer: Self-pay

## 2017-10-15 DIAGNOSIS — L89524 Pressure ulcer of left ankle, stage 4: Secondary | ICD-10-CM
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
## 2017-10-20 ENCOUNTER — Encounter

## 2017-10-20 ENCOUNTER — Telehealth (HOSPITAL_COMMUNITY): Payer: Self-pay | Admitting: Nurse Practitioner

## 2017-10-20 ENCOUNTER — Ambulatory Visit (HOSPITAL_COMMUNITY): Payer: PPO | Admitting: Physical Therapy

## 2017-10-20 NOTE — Telephone Encounter (Signed)
10/20/17  wants a later appt this afternoon if it comes available... has to bring someone to the sleep center and doesnt want to make the drive twice today

## 2017-10-23 ENCOUNTER — Ambulatory Visit (HOSPITAL_COMMUNITY): Payer: PPO | Attending: Nurse Practitioner | Admitting: Physical Therapy

## 2017-10-23 DIAGNOSIS — L89154 Pressure ulcer of sacral region, stage 4: Secondary | ICD-10-CM | POA: Diagnosis not present

## 2017-10-23 DIAGNOSIS — L89524 Pressure ulcer of left ankle, stage 4: Secondary | ICD-10-CM | POA: Diagnosis not present
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
## 2017-10-27 ENCOUNTER — Ambulatory Visit (HOSPITAL_COMMUNITY): Payer: PPO

## 2017-10-27 ENCOUNTER — Encounter (HOSPITAL_COMMUNITY): Payer: Self-pay

## 2017-10-27 ENCOUNTER — Other Ambulatory Visit: Payer: Self-pay

## 2017-10-27 DIAGNOSIS — L89154 Pressure ulcer of sacral region, stage 4: Secondary | ICD-10-CM

## 2017-10-27 DIAGNOSIS — L89524 Pressure ulcer of left ankle, stage 4: Secondary | ICD-10-CM
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
## 2017-10-30 ENCOUNTER — Ambulatory Visit (HOSPITAL_COMMUNITY): Payer: PPO | Admitting: Physical Therapy

## 2017-10-30 ENCOUNTER — Telehealth (HOSPITAL_COMMUNITY): Payer: Self-pay | Admitting: Physical Therapy
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
## 2017-11-03 ENCOUNTER — Ambulatory Visit (HOSPITAL_COMMUNITY): Payer: PPO | Admitting: Physical Therapy

## 2017-11-03 ENCOUNTER — Telehealth (HOSPITAL_COMMUNITY): Payer: Self-pay | Admitting: Nurse Practitioner
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
## 2017-11-05 ENCOUNTER — Telehealth (HOSPITAL_COMMUNITY): Payer: Self-pay

## 2017-11-05 NOTE — Telephone Encounter (Signed)
He has to help his brother and can not come today. NF 11/05/2017

## 2017-11-06 ENCOUNTER — Ambulatory Visit (HOSPITAL_COMMUNITY): Payer: PPO

## 2017-11-10 ENCOUNTER — Ambulatory Visit (HOSPITAL_COMMUNITY): Payer: PPO | Admitting: Physical Therapy

## 2017-11-10 DIAGNOSIS — L89154 Pressure ulcer of sacral region, stage 4: Secondary | ICD-10-CM

## 2017-11-10 DIAGNOSIS — L89524 Pressure ulcer of left ankle, stage 4: Secondary | ICD-10-CM | POA: Diagnosis not present

## 2017-11-10 NOTE — Addendum Note (Signed)
Addended by: Leeroy Cha on: 11/10/2017 04:17 PM   Modules accepted: Orders
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
## 2017-11-12 ENCOUNTER — Ambulatory Visit (HOSPITAL_COMMUNITY): Payer: PPO | Admitting: Physical Therapy

## 2017-11-12 DIAGNOSIS — L89524 Pressure ulcer of left ankle, stage 4: Secondary | ICD-10-CM | POA: Diagnosis not present
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
## 2017-11-17 ENCOUNTER — Ambulatory Visit (HOSPITAL_COMMUNITY): Payer: PPO | Attending: Nurse Practitioner | Admitting: Physical Therapy

## 2017-11-17 ENCOUNTER — Telehealth (HOSPITAL_COMMUNITY): Payer: Self-pay | Admitting: Physical Therapy

## 2017-11-17 DIAGNOSIS — L89154 Pressure ulcer of sacral region, stage 4: Secondary | ICD-10-CM | POA: Insufficient documentation

## 2017-11-17 DIAGNOSIS — L89524 Pressure ulcer of left ankle, stage 4: Secondary | ICD-10-CM | POA: Insufficient documentation

## 2017-11-17 NOTE — Telephone Encounter (Signed)
Contacted patient about missing appointment.  Therapist left a message and reminded pt of his next appointment.  Rayetta Humphrey, [place] CLT [phone]

## 2017-11-19 ENCOUNTER — Encounter (HOSPITAL_COMMUNITY): Payer: Self-pay

## 2017-11-19 ENCOUNTER — Ambulatory Visit (HOSPITAL_COMMUNITY): Payer: PPO

## 2017-11-19 DIAGNOSIS — L89524 Pressure ulcer of left ankle, stage 4: Secondary | ICD-10-CM | POA: Diagnosis not present

## 2017-11-19 DIAGNOSIS — L89154 Pressure ulcer of sacral region, stage 4: Secondary | ICD-10-CM | POA: Diagnosis not present
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
## 2017-11-23 ENCOUNTER — Telehealth (HOSPITAL_COMMUNITY): Payer: Self-pay | Admitting: Nurse Practitioner

## 2017-11-23 ENCOUNTER — Ambulatory Visit (HOSPITAL_COMMUNITY): Payer: PPO | Admitting: Physical Therapy

## 2017-11-23 DIAGNOSIS — L89524 Pressure ulcer of left ankle, stage 4: Secondary | ICD-10-CM | POA: Diagnosis not present
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
## 2017-11-23 NOTE — Telephone Encounter (Signed)
11/23/17  Left a message asking if he could/would come in earlier today... At 4

## 2017-11-26 ENCOUNTER — Ambulatory Visit (HOSPITAL_COMMUNITY): Payer: PPO | Admitting: Physical Therapy

## 2017-11-26 ENCOUNTER — Encounter (HOSPITAL_COMMUNITY): Payer: Self-pay | Admitting: Physical Therapy

## 2017-11-26 DIAGNOSIS — L89524 Pressure ulcer of left ankle, stage 4: Secondary | ICD-10-CM
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
## 2017-11-30 ENCOUNTER — Ambulatory Visit (HOSPITAL_COMMUNITY): Payer: PPO | Admitting: Physical Therapy

## 2017-11-30 ENCOUNTER — Telehealth (HOSPITAL_COMMUNITY): Payer: Self-pay | Admitting: Physical Therapy

## 2017-11-30 DIAGNOSIS — N390 Urinary tract infection, site not specified: Secondary | ICD-10-CM | POA: Diagnosis not present

## 2017-11-30 DIAGNOSIS — N319 Neuromuscular dysfunction of bladder, unspecified: Secondary | ICD-10-CM | POA: Diagnosis not present

## 2017-11-30 NOTE — Telephone Encounter (Signed)
Patient needed to go with a friend to the hospital that is having surgery today, he will see [place] on next visit.

## 2017-11-30 NOTE — Telephone Encounter (Signed)
Patient needed to cancel for today he went with a friend to the hospital that is having surgery.

## 2017-12-02 ENCOUNTER — Other Ambulatory Visit: Payer: Self-pay | Admitting: Nurse Practitioner

## 2017-12-03 ENCOUNTER — Encounter (HOSPITAL_COMMUNITY): Payer: Self-pay

## 2017-12-03 ENCOUNTER — Ambulatory Visit (HOSPITAL_COMMUNITY): Payer: PPO

## 2017-12-03 DIAGNOSIS — L89524 Pressure ulcer of left ankle, stage 4: Secondary | ICD-10-CM | POA: Diagnosis not present
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
## 2017-12-04 ENCOUNTER — Other Ambulatory Visit: Payer: Self-pay | Admitting: Nurse Practitioner

## 2017-12-04 NOTE — Telephone Encounter (Signed)
Last lipid 04/23/15  MMM

## 2017-12-07 ENCOUNTER — Ambulatory Visit (HOSPITAL_COMMUNITY): Payer: PPO | Admitting: Physical Therapy

## 2017-12-07 DIAGNOSIS — L89524 Pressure ulcer of left ankle, stage 4: Secondary | ICD-10-CM
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
## 2017-12-10 ENCOUNTER — Ambulatory Visit (HOSPITAL_COMMUNITY): Payer: PPO

## 2017-12-10 ENCOUNTER — Encounter (HOSPITAL_COMMUNITY): Payer: Self-pay

## 2017-12-10 DIAGNOSIS — L89154 Pressure ulcer of sacral region, stage 4: Secondary | ICD-10-CM

## 2017-12-10 DIAGNOSIS — L89524 Pressure ulcer of left ankle, stage 4: Secondary | ICD-10-CM
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
## 2017-12-14 ENCOUNTER — Ambulatory Visit (HOSPITAL_COMMUNITY): Payer: PPO | Admitting: Physical Therapy

## 2017-12-14 DIAGNOSIS — L89154 Pressure ulcer of sacral region, stage 4: Secondary | ICD-10-CM

## 2017-12-14 DIAGNOSIS — L89524 Pressure ulcer of left ankle, stage 4: Secondary | ICD-10-CM
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
## 2017-12-16 ENCOUNTER — Ambulatory Visit (INDEPENDENT_AMBULATORY_CARE_PROVIDER_SITE_OTHER): Payer: PPO | Admitting: Family Medicine

## 2017-12-16 ENCOUNTER — Encounter: Payer: Self-pay | Admitting: Family Medicine

## 2017-12-16 VITALS — BP 140/83 | HR 87 | Temp 97.4°F

## 2017-12-16 DIAGNOSIS — N3000 Acute cystitis without hematuria: Secondary | ICD-10-CM

## 2017-12-16 DIAGNOSIS — R300 Dysuria: Secondary | ICD-10-CM | POA: Diagnosis not present

## 2017-12-16 LAB — URINALYSIS, COMPLETE
BILIRUBIN UA: NEGATIVE
GLUCOSE, UA: NEGATIVE
Ketones, UA: NEGATIVE
Nitrite, UA: NEGATIVE
SPEC GRAV UA: 1.015 (ref 1.005–1.030)
UUROB: 1 mg/dL (ref 0.2–1.0)
pH, UA: 8 — ABNORMAL HIGH (ref 5.0–7.5)

## 2017-12-16 LAB — MICROSCOPIC EXAMINATION
Epithelial Cells (non renal): NONE SEEN /hpf (ref 0–10)
RENAL EPITHEL UA: NONE SEEN /HPF

## 2017-12-16 MED ORDER — CIPROFLOXACIN HCL 500 MG PO TABS: 500.0000 mg | ORAL_TABLET | Freq: Two times a day (BID) | ORAL | 0 refills | Status: DC

## 2017-12-16 NOTE — Progress Notes (Signed)
BP 140/83   Pulse 87   Temp (!) 97.4 F (36.3 C) (Oral)    Subjective:    Patient ID: Dustin Medina, male    DOB: May 20, 1950, 67 y.o.   MRN: [id number]  HPI: Dustin Medina is a 67 y.o. male presenting on 12/16/2017 for Dysuria (x 3-4 days); cloudy urine; and Back Pain   HPI Cloudy urine and dysuria and low back pain for 3 to 4 days Cloudy urine and dysuria and low back pain is been going on for 3 to 4 days.  he says that it has started up and has been increasing over the past 3 or 4 days.  He says that his low back hurts more on the right lower back.  He denies any fevers or chills shortness of breath or wheezing.  Patient is wheelchair-bound and has had UTIs somewhat frequently.  He denies any blood in his urine.  He has tried to increase his fluid intake and he has been using his medications for his lower back and they do not seem to be helping.  Relevant past medical, surgical, family and social history reviewed and updated as indicated. Interim medical history since our last visit reviewed. Allergies and medications reviewed and updated.  Review of Systems  Constitutional: Negative for chills and fever.  Respiratory: Negative for shortness of breath and wheezing.   Cardiovascular: Negative for chest pain and leg swelling.  Gastrointestinal: Negative for abdominal pain.  Genitourinary: Positive for dysuria, flank pain, frequency and urgency. Negative for difficulty urinating, discharge, hematuria, penile pain, penile swelling, scrotal swelling and testicular pain.  Musculoskeletal: Positive for back pain. Negative for gait problem.  Skin: Negative for rash.  All other systems reviewed and are negative.   Per HPI unless specifically indicated above   Allergies as of 12/16/2017   No Known Allergies     Medication List        Accurate as of 12/16/17  4:47 PM. Always use your most recent med list.          acetaminophen 325 MG tablet Commonly known as:  TYLENOL Take  650 mg by mouth every 6 (six) hours as needed for mild pain or moderate pain.   ciprofloxacin 500 MG tablet Commonly known as:  CIPRO Take 1 tablet (500 mg total) by mouth 2 (two) times daily.   Co Q 10 100 MG Caps Take 1 capsule by mouth daily.   cyclobenzaprine 10 MG tablet Commonly known as:  FLEXERIL TAKE 1 TABLET (10 MG TOTAL) BY MOUTH 3 (THREE) TIMES DAILY AS NEEDED FOR MUSCLE SPASMS.   meloxicam 15 MG tablet Commonly known as:  MOBIC TAKE 1 TABLET (15 MG TOTAL) BY MOUTH DAILY AS NEEDED.   multivitamin with minerals Tabs tablet Take 1 tablet by mouth daily.   niacin 1000 MG CR tablet Commonly known as:  NIASPAN TAKE 1 TABLET (1,000 MG TOTAL) BY MOUTH AT BEDTIME.   ondansetron 4 MG disintegrating tablet Commonly known as:  ZOFRAN-ODT Take 1 tablet (4 mg total) by mouth every 8 (eight) hours as needed for nausea or vomiting.   potassium chloride 10 MEQ tablet Commonly known as:  K-DUR Take 2 tablets (20 mEq total) by mouth 2 (two) times daily for 2 days.   PRO COMFORT TENS UNIT Devi Apply 1 Units topically daily as needed.   PRO COMFORT TENS ELECTRODES Misc Apply 2 patches topically daily as needed.   promethazine 25 MG tablet Commonly known as:  PHENERGAN Take  1 tablet (25 mg total) by mouth every 8 (eight) hours as needed for nausea or vomiting.   testosterone cypionate 200 MG/ML injection Commonly known as:  DEPOTESTOSTERONE CYPIONATE INJECT 0.75ML INTRAMUSCULARLY EVERY 14 DAYS   trimethoprim 100 MG tablet Commonly known as:  TRIMPEX TAKE 1 TABLET BY MOUTH EVERY DAY   valsartan-hydrochlorothiazide 320-25 MG tablet Commonly known as:  DIOVAN-HCT Take 1 tablet by mouth daily.          Objective:    BP 140/83   Pulse 87   Temp (!) 97.4 F (36.3 C) (Oral)   Wt Readings from Last 3 Encounters:  04/09/17 155 lb (70.3 kg)  07/22/16 155 lb (70.3 kg)  10/02/13 178 lb 12.7 oz (81.1 kg)    Physical Exam  Constitutional: He is oriented to person,  place, and time. He appears well-developed and well-nourished. No distress.  Eyes: Conjunctivae are normal. No scleral icterus.  Neck: Neck supple. No thyromegaly present.  Cardiovascular: Normal rate, regular rhythm, normal heart sounds and intact distal pulses.  No murmur heard. Pulmonary/Chest: Effort normal and breath sounds normal. No respiratory distress. He has no wheezes.  Abdominal: Soft. Bowel sounds are normal. He exhibits no distension and no mass. There is no tenderness. There is no guarding.  Musculoskeletal: Normal range of motion. He exhibits tenderness (Right lower back pain, possible CVA tenderness). He exhibits no edema.  Lymphadenopathy:    He has no cervical adenopathy.  Neurological: He is alert and oriented to person, place, and time. Coordination normal.  Skin: Skin is warm and dry. No rash noted. He is not diaphoretic.  Psychiatric: He has a normal mood and affect. His behavior is normal.  Nursing note and vitals reviewed.   Urinalysis: Greater than 30 WBCs, 3-10 RBCs, mucus present, moderate bacteria, 3+ leukocytes, trace blood, 1+ protein    Assessment & Plan:   Problem List Items Addressed This Visit    None    Visit Diagnoses    Acute cystitis without hematuria    -  Primary   Relevant Medications   ciprofloxacin (CIPRO) 500 MG tablet   Other Relevant Orders   Urinalysis, Complete   Urine Culture       Follow up plan: Return if symptoms worsen or fail to improve.  Counseling provided for all of the vaccine components Orders Placed This Encounter  Procedures  . Urinalysis, Complete    Caryl Pina, MD [facility] 12/16/2017, 4:47 PM

## 2017-12-17 ENCOUNTER — Ambulatory Visit (HOSPITAL_COMMUNITY): Payer: PPO | Admitting: Physical Therapy

## 2017-12-18 ENCOUNTER — Telehealth: Payer: Self-pay | Admitting: Family Medicine

## 2017-12-18 LAB — URINE CULTURE

## 2017-12-18 MED ORDER — NITROFURANTOIN MONOHYD MACRO 100 MG PO CAPS: 100.0000 mg | ORAL_CAPSULE | Freq: Two times a day (BID) | ORAL | 0 refills | Status: DC

## 2017-12-18 NOTE — Telephone Encounter (Signed)
Patient's urine grew E. coli which was resistant to Cipro, I sent a different antibiotic in Macrobid for him that should work for his UTI

## 2017-12-18 NOTE — Telephone Encounter (Signed)
Patient aware and verbalizes understanding. 

## 2017-12-23 ENCOUNTER — Telehealth (HOSPITAL_COMMUNITY): Payer: Self-pay

## 2017-12-23 ENCOUNTER — Ambulatory Visit (HOSPITAL_COMMUNITY): Payer: PPO

## 2017-12-23 NOTE — Telephone Encounter (Signed)
He is taking care of his mother and can not get here, he will see [place] Tuesday.

## 2017-12-29 ENCOUNTER — Ambulatory Visit (HOSPITAL_COMMUNITY): Payer: PPO | Attending: Nurse Practitioner | Admitting: Physical Therapy

## 2017-12-29 DIAGNOSIS — L89524 Pressure ulcer of left ankle, stage 4: Secondary | ICD-10-CM | POA: Diagnosis not present
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
## 2017-12-31 ENCOUNTER — Telehealth (HOSPITAL_COMMUNITY): Payer: Self-pay | Admitting: Physical Therapy

## 2017-12-31 ENCOUNTER — Ambulatory Visit (HOSPITAL_COMMUNITY): Payer: PPO | Admitting: Physical Therapy

## 2017-12-31 NOTE — Telephone Encounter (Signed)
Called pt re missed appointment.  Left message as there was no answer.   Rayetta Humphrey, [place] CLT [phone]

## 2018-01-05 ENCOUNTER — Ambulatory Visit (HOSPITAL_COMMUNITY): Payer: PPO | Admitting: Physical Therapy

## 2018-01-05 DIAGNOSIS — L89524 Pressure ulcer of left ankle, stage 4: Secondary | ICD-10-CM
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
## 2018-01-06 DIAGNOSIS — N390 Urinary tract infection, site not specified: Secondary | ICD-10-CM | POA: Diagnosis not present

## 2018-01-06 DIAGNOSIS — N319 Neuromuscular dysfunction of bladder, unspecified: Secondary | ICD-10-CM | POA: Diagnosis not present

## 2018-01-07 ENCOUNTER — Ambulatory Visit (HOSPITAL_COMMUNITY): Payer: PPO

## 2018-01-08 ENCOUNTER — Encounter (HOSPITAL_COMMUNITY): Payer: Self-pay

## 2018-01-08 ENCOUNTER — Ambulatory Visit (HOSPITAL_COMMUNITY): Payer: PPO

## 2018-01-08 DIAGNOSIS — L89524 Pressure ulcer of left ankle, stage 4: Secondary | ICD-10-CM
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
## 2018-01-12 ENCOUNTER — Ambulatory Visit (HOSPITAL_COMMUNITY): Payer: PPO

## 2018-01-12 ENCOUNTER — Encounter (HOSPITAL_COMMUNITY): Payer: Self-pay

## 2018-01-12 DIAGNOSIS — L89524 Pressure ulcer of left ankle, stage 4: Secondary | ICD-10-CM | POA: Diagnosis not present
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
## 2018-01-14 ENCOUNTER — Other Ambulatory Visit: Payer: Self-pay | Admitting: Nurse Practitioner

## 2018-01-15 NOTE — Telephone Encounter (Signed)
Last seen 12/16/17  MMM

## 2018-01-19 ENCOUNTER — Ambulatory Visit (HOSPITAL_COMMUNITY): Payer: PPO | Attending: Nurse Practitioner | Admitting: Physical Therapy

## 2018-01-19 DIAGNOSIS — L89524 Pressure ulcer of left ankle, stage 4: Secondary | ICD-10-CM | POA: Diagnosis not present

## 2018-01-19 DIAGNOSIS — L89154 Pressure ulcer of sacral region, stage 4: Secondary | ICD-10-CM | POA: Insufficient documentation
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
## 2018-01-21 ENCOUNTER — Ambulatory Visit (HOSPITAL_COMMUNITY): Payer: PPO | Admitting: Physical Therapy

## 2018-01-22 ENCOUNTER — Ambulatory Visit (HOSPITAL_COMMUNITY): Payer: PPO | Admitting: Physical Therapy

## 2018-01-22 DIAGNOSIS — L89524 Pressure ulcer of left ankle, stage 4: Secondary | ICD-10-CM | POA: Diagnosis not present

## 2018-01-22 DIAGNOSIS — L89154 Pressure ulcer of sacral region, stage 4: Secondary | ICD-10-CM
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
## 2018-01-26 ENCOUNTER — Ambulatory Visit (HOSPITAL_COMMUNITY): Payer: PPO | Admitting: Physical Therapy

## 2018-01-26 ENCOUNTER — Telehealth (HOSPITAL_COMMUNITY): Payer: Self-pay | Admitting: Physical Therapy

## 2018-01-26 NOTE — Telephone Encounter (Signed)
Called pt re missed appointment.  Left message of when his next appointment will be.    Rayetta Humphrey, [place] CLT [phone]

## 2018-01-29 ENCOUNTER — Ambulatory Visit (HOSPITAL_COMMUNITY): Payer: PPO | Admitting: Physical Therapy

## 2018-01-29 DIAGNOSIS — L89154 Pressure ulcer of sacral region, stage 4: Secondary | ICD-10-CM

## 2018-01-29 DIAGNOSIS — L89524 Pressure ulcer of left ankle, stage 4: Secondary | ICD-10-CM | POA: Diagnosis not present
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
## 2018-02-02 ENCOUNTER — Ambulatory Visit (HOSPITAL_COMMUNITY): Payer: PPO

## 2018-02-02 ENCOUNTER — Encounter (HOSPITAL_COMMUNITY): Payer: Self-pay

## 2018-02-02 DIAGNOSIS — L89524 Pressure ulcer of left ankle, stage 4: Secondary | ICD-10-CM
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
## 2018-02-05 ENCOUNTER — Ambulatory Visit (HOSPITAL_COMMUNITY): Payer: PPO

## 2018-02-05 ENCOUNTER — Telehealth (HOSPITAL_COMMUNITY): Payer: Self-pay | Admitting: Nurse Practitioner
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
## 2018-02-09 ENCOUNTER — Ambulatory Visit (HOSPITAL_COMMUNITY): Payer: PPO | Admitting: Physical Therapy

## 2018-02-12 ENCOUNTER — Ambulatory Visit (HOSPITAL_COMMUNITY): Payer: PPO

## 2018-02-12 ENCOUNTER — Other Ambulatory Visit: Payer: Self-pay | Admitting: Nurse Practitioner

## 2018-02-12 NOTE — Telephone Encounter (Signed)
Last seen 12/16/17  MMM

## 2018-02-16 ENCOUNTER — Encounter (HOSPITAL_COMMUNITY): Payer: Self-pay

## 2018-02-16 ENCOUNTER — Ambulatory Visit (HOSPITAL_COMMUNITY): Payer: PPO

## 2018-02-16 ENCOUNTER — Other Ambulatory Visit: Payer: Self-pay

## 2018-02-16 DIAGNOSIS — L89524 Pressure ulcer of left ankle, stage 4: Secondary | ICD-10-CM | POA: Diagnosis not present
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
## 2018-02-18 DIAGNOSIS — N319 Neuromuscular dysfunction of bladder, unspecified: Secondary | ICD-10-CM | POA: Diagnosis not present

## 2018-02-18 DIAGNOSIS — N390 Urinary tract infection, site not specified: Secondary | ICD-10-CM | POA: Diagnosis not present

## 2018-02-19 ENCOUNTER — Ambulatory Visit (HOSPITAL_COMMUNITY): Payer: PPO

## 2018-02-19 ENCOUNTER — Telehealth (HOSPITAL_COMMUNITY): Payer: Self-pay | Admitting: Nurse Practitioner

## 2018-02-19 NOTE — Telephone Encounter (Signed)
02/19/18  PT LEFT A MESSAGE TO CX TODAY - NO REASON GIVEN

## 2018-02-23 ENCOUNTER — Ambulatory Visit (HOSPITAL_COMMUNITY): Payer: PPO | Attending: Nurse Practitioner | Admitting: Physical Therapy

## 2018-02-23 DIAGNOSIS — L89524 Pressure ulcer of left ankle, stage 4: Secondary | ICD-10-CM | POA: Diagnosis not present

## 2018-02-23 DIAGNOSIS — L89510 Pressure ulcer of right ankle, unstageable: Secondary | ICD-10-CM | POA: Diagnosis not present
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
## 2018-02-26 ENCOUNTER — Ambulatory Visit (HOSPITAL_COMMUNITY): Payer: PPO | Admitting: Physical Therapy

## 2018-02-26 DIAGNOSIS — L89524 Pressure ulcer of left ankle, stage 4: Secondary | ICD-10-CM | POA: Diagnosis not present
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
## 2018-03-02 ENCOUNTER — Encounter (HOSPITAL_COMMUNITY): Payer: Self-pay | Admitting: Physical Therapy

## 2018-03-02 ENCOUNTER — Ambulatory Visit (HOSPITAL_COMMUNITY): Payer: PPO | Admitting: Physical Therapy

## 2018-03-02 ENCOUNTER — Other Ambulatory Visit: Payer: Self-pay | Admitting: Nurse Practitioner

## 2018-03-02 DIAGNOSIS — L89524 Pressure ulcer of left ankle, stage 4: Secondary | ICD-10-CM | POA: Diagnosis not present

## 2018-03-02 NOTE — Addendum Note (Signed)
Addended by: Leeroy Cha on: 03/02/2018 03:18 PM   Modules accepted: Orders
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
## 2018-03-04 ENCOUNTER — Ambulatory Visit (HOSPITAL_COMMUNITY): Payer: PPO | Admitting: Physical Therapy

## 2018-03-04 DIAGNOSIS — L89524 Pressure ulcer of left ankle, stage 4: Secondary | ICD-10-CM | POA: Diagnosis not present
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
## 2018-03-09 ENCOUNTER — Other Ambulatory Visit: Payer: Self-pay | Admitting: Family Medicine

## 2018-03-09 ENCOUNTER — Ambulatory Visit (HOSPITAL_COMMUNITY): Payer: PPO | Admitting: Physical Therapy

## 2018-03-09 DIAGNOSIS — L89524 Pressure ulcer of left ankle, stage 4: Secondary | ICD-10-CM | POA: Diagnosis not present

## 2018-03-09 NOTE — Telephone Encounter (Signed)
Last lipid 08/02/15 
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
## 2018-03-11 ENCOUNTER — Ambulatory Visit (HOSPITAL_COMMUNITY): Payer: PPO

## 2018-03-11 ENCOUNTER — Encounter (HOSPITAL_COMMUNITY): Payer: Self-pay

## 2018-03-11 ENCOUNTER — Other Ambulatory Visit: Payer: Self-pay

## 2018-03-11 DIAGNOSIS — L89524 Pressure ulcer of left ankle, stage 4: Secondary | ICD-10-CM

## 2018-03-11 DIAGNOSIS — L89510 Pressure ulcer of right ankle, unstageable: Secondary | ICD-10-CM
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
## 2018-03-12 ENCOUNTER — Other Ambulatory Visit: Payer: Self-pay | Admitting: Nurse Practitioner

## 2018-03-12 DIAGNOSIS — S91002A Unspecified open wound, left ankle, initial encounter: Secondary | ICD-10-CM

## 2018-03-12 NOTE — Progress Notes (Unsigned)
Ref wound care

## 2018-03-16 ENCOUNTER — Encounter: Payer: Self-pay | Admitting: Family

## 2018-03-16 ENCOUNTER — Ambulatory Visit (INDEPENDENT_AMBULATORY_CARE_PROVIDER_SITE_OTHER): Payer: PPO | Admitting: Family

## 2018-03-16 ENCOUNTER — Ambulatory Visit (HOSPITAL_COMMUNITY): Payer: PPO

## 2018-03-16 ENCOUNTER — Encounter (HOSPITAL_COMMUNITY): Payer: Self-pay

## 2018-03-16 VITALS — BP 137/69 | HR 67 | Temp 99.4°F | Ht 71.0 in

## 2018-03-16 DIAGNOSIS — L89510 Pressure ulcer of right ankle, unstageable: Secondary | ICD-10-CM

## 2018-03-16 DIAGNOSIS — G8220 Paraplegia, unspecified: Secondary | ICD-10-CM

## 2018-03-16 DIAGNOSIS — L089 Local infection of the skin and subcutaneous tissue, unspecified: Secondary | ICD-10-CM | POA: Diagnosis not present

## 2018-03-16 DIAGNOSIS — L89524 Pressure ulcer of left ankle, stage 4: Secondary | ICD-10-CM

## 2018-03-16 MED ORDER — CEPHALEXIN 500 MG PO CAPS: 500.0000 mg | ORAL_CAPSULE | Freq: Three times a day (TID) | ORAL | 0 refills | Status: DC

## 2018-03-16 NOTE — Patient Instructions (Signed)
Wound Care, Adult  Taking care of your wound properly can help to prevent pain, infection, and scarring. It can also help your wound to heal more quickly.  How to care for your wound  Wound care          Follow instructions from your health care provider about how to take care of your wound. Make sure you:  ? Wash your hands with soap and water before you change the bandage (dressing). If soap and water are not available, use hand sanitizer.  ? Change your dressing as told by your health care provider.  ? Leave stitches (sutures), skin glue, or adhesive strips in place. These skin closures may need to stay in place for 2 weeks or longer. If adhesive strip edges start to loosen and curl up, you may trim the loose edges. Do not remove adhesive strips completely unless your health care provider tells you to do that.   Check your wound area every day for signs of infection. Check for:  ? Redness, swelling, or pain.  ? Fluid or blood.  ? Warmth.  ? Pus or a bad smell.   Ask your health care provider if you should clean the wound with mild soap and water. Doing this may include:  ? Using a clean towel to pat the wound dry after cleaning it. Do not rub or scrub the wound.  ? Applying a cream or ointment. Do this only as told by your health care provider.  ? Covering the incision with a clean dressing.   Ask your health care provider when you can leave the wound uncovered.   Keep the dressing dry until your health care provider says it can be removed. Do not take baths, swim, use a hot tub, or do anything that would put the wound underwater until your health care provider approves. Ask your health care provider if you can take showers. You may only be allowed to take sponge baths.  Medicines     If you were prescribed an antibiotic medicine, cream, or ointment, take or use the antibiotic as told by your health care provider. Do not stop taking or using the antibiotic even if your condition improves.   Take  over-the-counter and prescription medicines only as told by your health care provider. If you were prescribed pain medicine, take it 30 or more minutes before you do any wound care or as told by your health care provider.  General instructions   Return to your normal activities as told by your health care provider. Ask your health care provider what activities are safe.   Do not scratch or pick at the wound.   Do not use any products that contain nicotine or tobacco, such as cigarettes and e-cigarettes. These may delay wound healing. If you need help quitting, ask your health care provider.   Keep all follow-up visits as told by your health care provider. This is important.   Eat a diet that includes protein, vitamin A, vitamin C, and other nutrient-rich foods to help the wound heal.  ? Foods rich in protein include meat, dairy, beans, nuts, and other sources.  ? Foods rich in vitamin A include carrots and dark green, leafy vegetables.  ? Foods rich in vitamin C include citrus, tomatoes, and other fruits and vegetables.  ? Nutrient-rich foods have protein, carbohydrates, fat, vitamins, or minerals. Eat a variety of healthy foods including vegetables, fruits, and whole grains.  Contact a health care provider if:     You received a tetanus shot and you have swelling, severe pain, redness, or bleeding at the injection site.   Your pain is not controlled with medicine.   You have redness, swelling, or pain around the wound.   You have fluid or blood coming from the wound.   Your wound feels warm to the touch.   You have pus or a bad smell coming from the wound.   You have a fever or chills.   You are nauseous or you vomit.   You are dizzy.  Get help right away if:   You have a red streak going away from your wound.   The edges of the wound open up and separate.   Your wound is bleeding, and the bleeding does not stop with gentle pressure.   You have a rash.   You faint.   You have trouble  breathing.  Summary   Always wash your hands with soap and water before changing your bandage (dressing).   To help with healing, eat foods that are rich in protein, vitamin A, vitamin C, and other nutrients.   Check your wound every day for signs of infection. Contact your health care provider if you suspect that your wound is infected.  This information is not intended to replace advice given to you by your health care provider. Make sure you discuss any questions you have with your health care provider.  Document Released: 11/13/2007 Document Revised: 03/17/2017 Document Reviewed: 08/21/2015  [employer]vier Interactive Patient Education  2019 [employer]
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
## 2018-03-16 NOTE — Progress Notes (Signed)
Subjective:    Patient ID: Dustin Medina, male    DOB: 01/19/1951, 68 y.o.   MRN: [id number]  Chief Complaint  Patient presents with  . Wound Infection    Right Ankle    HPI PT presents to the office today with right ankle ulcer. He is a paraplegic and goes to wound care twice a week. States he has an ulcer on bilateral ankle, but states the right ankle has developed an odor and dark yellow discharge.   He was at wound care today and was told he needed an antibiotic. He states he thought they were going to call it in, but the pharmacy never got a prescription. So he made an appointment with [place].   Pt has no feeling in his lower legs so denies any pain.   I have reviewed wound care notes.   Review of Systems  Skin: Positive for wound.  All other systems reviewed and are negative.      Objective:   Physical Exam Vitals signs reviewed.  Constitutional:      General: He is not in acute distress.    Appearance: He is well-developed.  Neck:     Musculoskeletal: Normal range of motion and neck supple.     Thyroid: No thyromegaly.  Cardiovascular:     Rate and Rhythm: Normal rate and regular rhythm.     Heart sounds: Normal heart sounds. No murmur.  Pulmonary:     Effort: Pulmonary effort is normal. No respiratory distress.     Breath sounds: Normal breath sounds. No wheezing.  Abdominal:     General: Bowel sounds are normal. There is no distension.     Palpations: Abdomen is soft.     Tenderness: There is no abdominal tenderness.  Musculoskeletal:     Comments: Pt in wheelchair  Skin:    Findings: No erythema or rash.     Comments: Left ankle wound dressed, right ankle wound dressed. Per Epic notes today wound 1.2X1.8, with Unstageable - Full thickness tissue loss in which the base of the ulcer is covered by slough (yellow, tan, gray, green or brown) and/or eschar (tan, brown or black) in the wound bed"  With foul odor and purulent green discharge.   Neurological:   Mental Status: He is alert and oriented to person, place, and time.     Cranial Nerves: No cranial nerve deficit.     Deep Tendon Reflexes: Reflexes are normal and symmetric.  Psychiatric:        Behavior: Behavior normal.        Thought Content: Thought content normal.        Judgment: Judgment normal.       BP 137/69   Pulse 67   Temp 99.4 F (37.4 C) (Oral)   Ht 5\' 11"  (1.803 m)   BMI 21.62 kg/m      Assessment & Plan:  Dustin Medina comes in today with chief complaint of Wound Infection (Right Ankle)   Diagnosis and orders addressed:  1. Paraplegic spinal paralysis ([place]) - cephALEXin (KEFLEX) 500 MG capsule; Take 1 capsule (500 mg total) by mouth 3 (three) times daily.  Dispense: 30 capsule; Refill: 0  2. Pressure ulcer of ankle, right, unstageable (HCC) - cephALEXin (KEFLEX) 500 MG capsule; Take 1 capsule (500 mg total) by mouth 3 (three) times daily.  Dispense: 30 capsule; Refill: 0  3. Skin infection - cephALEXin (KEFLEX) 500 MG capsule; Take 1 capsule (500 mg total) by mouth 3 (  three) times daily.  Dispense: 30 capsule; Refill: 0   We will start Keflex today Keep all wound care appts RTO if wound does not improve or worsens  Evelina Dun, FNP

## 2018-03-17 ENCOUNTER — Other Ambulatory Visit: Payer: Self-pay | Admitting: Nurse Practitioner

## 2018-03-17 MED ORDER — SULFAMETHOXAZOLE-TRIMETHOPRIM 800-160 MG PO TABS: 1.0000 | ORAL_TABLET | Freq: Two times a day (BID) | ORAL | 0 refills | Status: DC

## 2018-03-18 ENCOUNTER — Encounter (HOSPITAL_COMMUNITY): Payer: Self-pay

## 2018-03-18 ENCOUNTER — Ambulatory Visit (HOSPITAL_COMMUNITY): Payer: PPO

## 2018-03-18 DIAGNOSIS — L89524 Pressure ulcer of left ankle, stage 4: Secondary | ICD-10-CM | POA: Diagnosis not present
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
## 2018-03-24 ENCOUNTER — Telehealth: Payer: Self-pay | Admitting: Nurse Practitioner

## 2018-03-24 ENCOUNTER — Ambulatory Visit (HOSPITAL_COMMUNITY): Payer: PPO | Attending: Nurse Practitioner | Admitting: Physical Therapy

## 2018-03-24 DIAGNOSIS — S91109A Unspecified open wound of unspecified toe(s) without damage to nail, initial encounter: Secondary | ICD-10-CM | POA: Diagnosis not present

## 2018-03-24 DIAGNOSIS — L89510 Pressure ulcer of right ankle, unstageable: Secondary | ICD-10-CM | POA: Diagnosis not present

## 2018-03-24 DIAGNOSIS — L89524 Pressure ulcer of left ankle, stage 4: Secondary | ICD-10-CM | POA: Diagnosis not present
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
## 2018-03-25 MED ORDER — SULFAMETHOXAZOLE-TRIMETHOPRIM 800-160 MG PO TABS: 1.0000 | ORAL_TABLET | Freq: Two times a day (BID) | ORAL | 0 refills | Status: DC

## 2018-03-25 NOTE — Telephone Encounter (Signed)
Patient aware.

## 2018-03-25 NOTE — Telephone Encounter (Signed)
Refilled bactrim

## 2018-03-26 ENCOUNTER — Ambulatory Visit (HOSPITAL_COMMUNITY): Payer: PPO | Admitting: Physical Therapy

## 2018-03-26 DIAGNOSIS — L89524 Pressure ulcer of left ankle, stage 4: Secondary | ICD-10-CM

## 2018-03-26 DIAGNOSIS — L89510 Pressure ulcer of right ankle, unstageable: Secondary | ICD-10-CM
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
## 2018-03-30 ENCOUNTER — Ambulatory Visit (HOSPITAL_COMMUNITY): Payer: PPO | Admitting: Physical Therapy

## 2018-03-30 DIAGNOSIS — L89510 Pressure ulcer of right ankle, unstageable: Secondary | ICD-10-CM

## 2018-03-30 DIAGNOSIS — L89524 Pressure ulcer of left ankle, stage 4: Secondary | ICD-10-CM | POA: Diagnosis not present
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
## 2018-04-01 ENCOUNTER — Ambulatory Visit (HOSPITAL_COMMUNITY): Payer: PPO | Admitting: Physical Therapy

## 2018-04-01 DIAGNOSIS — L89524 Pressure ulcer of left ankle, stage 4: Secondary | ICD-10-CM

## 2018-04-01 DIAGNOSIS — L89510 Pressure ulcer of right ankle, unstageable: Secondary | ICD-10-CM
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
## 2018-04-06 ENCOUNTER — Other Ambulatory Visit: Payer: Self-pay

## 2018-04-06 ENCOUNTER — Ambulatory Visit (HOSPITAL_COMMUNITY): Payer: PPO

## 2018-04-06 ENCOUNTER — Encounter (HOSPITAL_COMMUNITY): Payer: Self-pay

## 2018-04-06 DIAGNOSIS — N319 Neuromuscular dysfunction of bladder, unspecified: Secondary | ICD-10-CM | POA: Diagnosis not present

## 2018-04-06 DIAGNOSIS — L89510 Pressure ulcer of right ankle, unstageable: Secondary | ICD-10-CM

## 2018-04-06 DIAGNOSIS — L89524 Pressure ulcer of left ankle, stage 4: Secondary | ICD-10-CM

## 2018-04-06 DIAGNOSIS — N390 Urinary tract infection, site not specified: Secondary | ICD-10-CM | POA: Diagnosis not present
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
## 2018-04-08 ENCOUNTER — Encounter (HOSPITAL_COMMUNITY): Payer: Self-pay | Admitting: Physical Therapy

## 2018-04-08 ENCOUNTER — Ambulatory Visit (HOSPITAL_COMMUNITY): Payer: PPO | Admitting: Physical Therapy

## 2018-04-08 DIAGNOSIS — L89510 Pressure ulcer of right ankle, unstageable: Secondary | ICD-10-CM

## 2018-04-08 DIAGNOSIS — L89524 Pressure ulcer of left ankle, stage 4: Secondary | ICD-10-CM
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
## 2018-04-12 DIAGNOSIS — G8220 Paraplegia, unspecified: Secondary | ICD-10-CM | POA: Diagnosis not present

## 2018-04-12 DIAGNOSIS — S24103A Unspecified injury at T7-T10 level of thoracic spinal cord, initial encounter: Secondary | ICD-10-CM | POA: Diagnosis not present

## 2018-04-13 ENCOUNTER — Ambulatory Visit (HOSPITAL_COMMUNITY): Payer: PPO | Admitting: Physical Therapy

## 2018-04-13 DIAGNOSIS — L89524 Pressure ulcer of left ankle, stage 4: Secondary | ICD-10-CM | POA: Diagnosis not present

## 2018-04-13 DIAGNOSIS — L89510 Pressure ulcer of right ankle, unstageable: Secondary | ICD-10-CM
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
## 2018-04-13 NOTE — Addendum Note (Signed)
Addended by: Leeroy Cha on: 04/13/2018 08:46 AM   Modules accepted: Orders

## 2018-04-15 ENCOUNTER — Telehealth: Payer: Self-pay | Admitting: Nurse Practitioner

## 2018-04-15 ENCOUNTER — Encounter (HOSPITAL_COMMUNITY): Payer: Self-pay | Admitting: Physical Therapy

## 2018-04-15 ENCOUNTER — Ambulatory Visit (HOSPITAL_COMMUNITY): Payer: PPO | Admitting: Physical Therapy

## 2018-04-15 DIAGNOSIS — L89510 Pressure ulcer of right ankle, unstageable: Secondary | ICD-10-CM

## 2018-04-15 DIAGNOSIS — L89524 Pressure ulcer of left ankle, stage 4: Secondary | ICD-10-CM | POA: Diagnosis not present

## 2018-04-15 DIAGNOSIS — S91109A Unspecified open wound of unspecified toe(s) without damage to nail, initial encounter: Secondary | ICD-10-CM
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
## 2018-04-20 ENCOUNTER — Ambulatory Visit (HOSPITAL_COMMUNITY): Payer: PPO | Attending: Nurse Practitioner

## 2018-04-20 ENCOUNTER — Other Ambulatory Visit: Payer: Self-pay

## 2018-04-20 ENCOUNTER — Encounter (HOSPITAL_COMMUNITY): Payer: Self-pay

## 2018-04-20 DIAGNOSIS — L89510 Pressure ulcer of right ankle, unstageable: Secondary | ICD-10-CM | POA: Diagnosis not present

## 2018-04-20 DIAGNOSIS — S91109A Unspecified open wound of unspecified toe(s) without damage to nail, initial encounter: Secondary | ICD-10-CM | POA: Insufficient documentation

## 2018-04-20 DIAGNOSIS — L89524 Pressure ulcer of left ankle, stage 4: Secondary | ICD-10-CM | POA: Insufficient documentation

## 2018-04-20 DIAGNOSIS — S91009A Unspecified open wound, unspecified ankle, initial encounter: Secondary | ICD-10-CM
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
## 2018-04-22 ENCOUNTER — Ambulatory Visit (HOSPITAL_COMMUNITY): Payer: PPO | Admitting: Physical Therapy

## 2018-04-22 DIAGNOSIS — L89524 Pressure ulcer of left ankle, stage 4: Secondary | ICD-10-CM | POA: Diagnosis not present

## 2018-04-22 DIAGNOSIS — L89510 Pressure ulcer of right ankle, unstageable: Secondary | ICD-10-CM

## 2018-04-22 DIAGNOSIS — S91109A Unspecified open wound of unspecified toe(s) without damage to nail, initial encounter: Secondary | ICD-10-CM
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
## 2018-04-26 ENCOUNTER — Telehealth (HOSPITAL_COMMUNITY): Payer: Self-pay | Admitting: Physical Therapy

## 2018-04-26 NOTE — Telephone Encounter (Signed)
L/m we could work pt in later in the day tomorrow if he wanted -req return phone call. NF 04/26/2018

## 2018-04-26 NOTE — Telephone Encounter (Signed)
He has another MD apptment at the same time and will not be here tomorrow.

## 2018-04-27 ENCOUNTER — Ambulatory Visit (HOSPITAL_COMMUNITY): Payer: PPO | Admitting: Physical Therapy

## 2018-04-27 DIAGNOSIS — L97328 Non-pressure chronic ulcer of left ankle with other specified severity: Secondary | ICD-10-CM | POA: Diagnosis not present

## 2018-04-27 DIAGNOSIS — L97329 Non-pressure chronic ulcer of left ankle with unspecified severity: Secondary | ICD-10-CM | POA: Diagnosis not present

## 2018-04-27 DIAGNOSIS — L97319 Non-pressure chronic ulcer of right ankle with unspecified severity: Secondary | ICD-10-CM | POA: Diagnosis not present

## 2018-04-27 DIAGNOSIS — Z79899 Other long term (current) drug therapy: Secondary | ICD-10-CM | POA: Diagnosis not present

## 2018-04-27 DIAGNOSIS — I10 Essential (primary) hypertension: Secondary | ICD-10-CM | POA: Diagnosis not present

## 2018-04-27 DIAGNOSIS — L97928 Non-pressure chronic ulcer of unspecified part of left lower leg with other specified severity: Secondary | ICD-10-CM | POA: Diagnosis not present

## 2018-04-27 DIAGNOSIS — L97918 Non-pressure chronic ulcer of unspecified part of right lower leg with other specified severity: Secondary | ICD-10-CM | POA: Diagnosis not present

## 2018-04-27 DIAGNOSIS — L97129 Non-pressure chronic ulcer of left thigh with unspecified severity: Secondary | ICD-10-CM | POA: Diagnosis not present

## 2018-04-28 ENCOUNTER — Telehealth: Payer: Self-pay | Admitting: Nurse Practitioner

## 2018-04-29 ENCOUNTER — Ambulatory Visit (HOSPITAL_COMMUNITY): Payer: PPO | Admitting: Physical Therapy

## 2018-04-29 DIAGNOSIS — Z7982 Long term (current) use of aspirin: Secondary | ICD-10-CM | POA: Diagnosis not present

## 2018-04-29 DIAGNOSIS — M545 Low back pain: Secondary | ICD-10-CM | POA: Diagnosis not present

## 2018-04-29 DIAGNOSIS — K592 Neurogenic bowel, not elsewhere classified: Secondary | ICD-10-CM | POA: Diagnosis not present

## 2018-04-29 DIAGNOSIS — N319 Neuromuscular dysfunction of bladder, unspecified: Secondary | ICD-10-CM | POA: Diagnosis not present

## 2018-04-29 DIAGNOSIS — K219 Gastro-esophageal reflux disease without esophagitis: Secondary | ICD-10-CM | POA: Diagnosis not present

## 2018-04-29 DIAGNOSIS — L97319 Non-pressure chronic ulcer of right ankle with unspecified severity: Secondary | ICD-10-CM | POA: Diagnosis not present

## 2018-04-29 DIAGNOSIS — Z4800 Encounter for change or removal of nonsurgical wound dressing: Secondary | ICD-10-CM | POA: Diagnosis not present

## 2018-04-29 DIAGNOSIS — G8220 Paraplegia, unspecified: Secondary | ICD-10-CM | POA: Diagnosis not present

## 2018-04-29 DIAGNOSIS — I10 Essential (primary) hypertension: Secondary | ICD-10-CM | POA: Diagnosis not present

## 2018-04-29 DIAGNOSIS — E785 Hyperlipidemia, unspecified: Secondary | ICD-10-CM | POA: Diagnosis not present

## 2018-04-29 DIAGNOSIS — L97329 Non-pressure chronic ulcer of left ankle with unspecified severity: Secondary | ICD-10-CM | POA: Diagnosis not present

## 2018-04-29 DIAGNOSIS — G8929 Other chronic pain: Secondary | ICD-10-CM | POA: Diagnosis not present

## 2018-04-29 DIAGNOSIS — S81802D Unspecified open wound, left lower leg, subsequent encounter: Secondary | ICD-10-CM | POA: Diagnosis not present

## 2018-05-04 ENCOUNTER — Ambulatory Visit (HOSPITAL_COMMUNITY): Payer: PPO | Admitting: Physical Therapy

## 2018-05-06 ENCOUNTER — Ambulatory Visit (HOSPITAL_COMMUNITY): Payer: PPO

## 2018-05-10 ENCOUNTER — Other Ambulatory Visit: Payer: Self-pay

## 2018-05-10 ENCOUNTER — Telehealth (INDEPENDENT_AMBULATORY_CARE_PROVIDER_SITE_OTHER): Payer: PPO | Admitting: Nurse Practitioner

## 2018-05-10 DIAGNOSIS — N3000 Acute cystitis without hematuria: Secondary | ICD-10-CM

## 2018-05-10 DIAGNOSIS — B9689 Other specified bacterial agents as the cause of diseases classified elsewhere: Secondary | ICD-10-CM | POA: Diagnosis not present

## 2018-05-10 MED ORDER — NITROFURANTOIN MONOHYD MACRO 100 MG PO CAPS: 100.0000 mg | ORAL_CAPSULE | Freq: Two times a day (BID) | ORAL | 0 refills | Status: DC

## 2018-05-10 NOTE — Progress Notes (Signed)
   Virtual Visit via telephone Note  I connected with Dustin Medina on 05/10/18 at 2:44 pm by telephone and verified that I am speaking with the correct person using two identifiers. Dustin Medina is currently located at home and no one is currently with her during visit. The provider, [name] Hassell Done, FNP is located in their office at time of visit.  I discussed the limitations, risks, security and privacy concerns of performing an evaluation and management service by telephone and the availability of in person appointments. I also discussed with the patient that there may be [facility] patient responsible charge related to this service. The patient expressed understanding and agreed to proceed.   History and Present Illness:   Chief Complaint: dysuria  HPI Patient calls in c/o dysuria and frequency.  This started 2 days ago. He just stopped taking bactrim for ulcer about 2 weeks ago.   Review of Systems  Constitutional: Negative for chills and fever.  HENT: Negative.   Respiratory: Negative.   Cardiovascular: Negative.   Genitourinary: Positive for dysuria, frequency and urgency.  Skin: Negative.   Neurological: Negative.   Psychiatric/Behavioral: Negative.   All other systems reviewed and are negative.         Observations/Objective: Alert and oriented- answers all questions appropriately Home health nurse came by this morning-bp 130/90 Hr-58 temp 96.1  Assessment and Plan: Dustin Medina in today with chief complaint of No chief complaint on file.   1. Acute cystitis without hematuria Take medication as prescribe Cotton underwear Take shower not bath Cranberry juice, yogurt Force fluids AZO over the counter X2 days Culture pending RTO prn Meds ordered this encounter  Medications  . nitrofurantoin, macrocrystal-monohydrate, (MACROBID) 100 MG capsule    Sig: Take 1 capsule (100 mg total) by mouth 2 (two) times daily. 1 po BId    Dispense:  14 capsule    Refill:   0    Order Specific Question:   Supervising Provider    Answer:   Dustin Medina [[id number]]      Follow Up Instructions:  Prn    I discussed the assessment and treatment plan with the patient. The patient was provided an opportunity to ask questions and all were answered. The patient agreed with the plan and demonstrated an understanding of the instructions.   The patient was advised to call back or seek an in-person evaluation if the symptoms worsen or if the condition fails to improve as anticipated.  The above assessment and management plan was discussed with the patient. The patient verbalized understanding of and has agreed to the management plan. Patient is aware to call the clinic if symptoms persist or worsen. Patient is aware when to return to the clinic for [facility] follow-up visit. Patient educated on when it is appropriate to go to the emergency department.    I provided 6 minutes of non-face-to-face time during this encounter.    [name] Hassell Done, FNP

## 2018-05-11 ENCOUNTER — Ambulatory Visit (HOSPITAL_COMMUNITY): Payer: PPO | Admitting: Physical Therapy

## 2018-05-12 ENCOUNTER — Telehealth: Payer: Self-pay | Admitting: Nurse Practitioner

## 2018-05-12 NOTE — Telephone Encounter (Signed)
Phone call to [facility] in [place], they did not receive Macrobid prescription.  I gave the prescription order verbally.  Called patient to notify him, but phone number listed as his mobile belongs to someone named Dustin Medina per the message.  Did not leave voicemail.

## 2018-05-13 ENCOUNTER — Ambulatory Visit (HOSPITAL_COMMUNITY): Payer: PPO | Admitting: Physical Therapy

## 2018-05-13 NOTE — Telephone Encounter (Signed)
Patient aware.

## 2018-05-14 DIAGNOSIS — L97129 Non-pressure chronic ulcer of left thigh with unspecified severity: Secondary | ICD-10-CM | POA: Diagnosis not present

## 2018-05-14 DIAGNOSIS — L97319 Non-pressure chronic ulcer of right ankle with unspecified severity: Secondary | ICD-10-CM | POA: Diagnosis not present

## 2018-05-14 DIAGNOSIS — L97519 Non-pressure chronic ulcer of other part of right foot with unspecified severity: Secondary | ICD-10-CM | POA: Diagnosis not present

## 2018-05-14 DIAGNOSIS — L97529 Non-pressure chronic ulcer of other part of left foot with unspecified severity: Secondary | ICD-10-CM | POA: Diagnosis not present

## 2018-05-14 DIAGNOSIS — L97329 Non-pressure chronic ulcer of left ankle with unspecified severity: Secondary | ICD-10-CM | POA: Diagnosis not present

## 2018-05-18 ENCOUNTER — Ambulatory Visit (HOSPITAL_COMMUNITY): Payer: PPO | Admitting: Physical Therapy

## 2018-05-20 DIAGNOSIS — C8339 Diffuse large B-cell lymphoma, extranodal and solid organ sites: Secondary | ICD-10-CM | POA: Diagnosis not present

## 2018-05-20 DIAGNOSIS — I70233 Atherosclerosis of native arteries of right leg with ulceration of ankle: Secondary | ICD-10-CM | POA: Diagnosis not present

## 2018-05-20 DIAGNOSIS — N319 Neuromuscular dysfunction of bladder, unspecified: Secondary | ICD-10-CM | POA: Diagnosis not present

## 2018-05-20 DIAGNOSIS — I739 Peripheral vascular disease, unspecified: Secondary | ICD-10-CM | POA: Diagnosis not present

## 2018-05-20 DIAGNOSIS — M2550 Pain in unspecified joint: Secondary | ICD-10-CM | POA: Diagnosis not present

## 2018-05-20 DIAGNOSIS — R339 Retention of urine, unspecified: Secondary | ICD-10-CM | POA: Diagnosis not present

## 2018-05-20 DIAGNOSIS — N390 Urinary tract infection, site not specified: Secondary | ICD-10-CM | POA: Diagnosis not present

## 2018-05-20 DIAGNOSIS — Z7401 Bed confinement status: Secondary | ICD-10-CM | POA: Diagnosis not present

## 2018-05-20 DIAGNOSIS — R410 Disorientation, unspecified: Secondary | ICD-10-CM | POA: Diagnosis not present

## 2018-05-20 DIAGNOSIS — I70243 Atherosclerosis of native arteries of left leg with ulceration of ankle: Secondary | ICD-10-CM | POA: Diagnosis not present

## 2018-05-20 DIAGNOSIS — M7989 Other specified soft tissue disorders: Secondary | ICD-10-CM | POA: Diagnosis not present

## 2018-05-20 DIAGNOSIS — L97318 Non-pressure chronic ulcer of right ankle with other specified severity: Secondary | ICD-10-CM | POA: Diagnosis not present

## 2018-05-20 DIAGNOSIS — L97328 Non-pressure chronic ulcer of left ankle with other specified severity: Secondary | ICD-10-CM | POA: Diagnosis not present

## 2018-05-22 ENCOUNTER — Other Ambulatory Visit: Payer: Self-pay | Admitting: Family Medicine

## 2018-05-22 DIAGNOSIS — I10 Essential (primary) hypertension: Secondary | ICD-10-CM

## 2018-06-01 DIAGNOSIS — I70235 Atherosclerosis of native arteries of right leg with ulceration of other part of foot: Secondary | ICD-10-CM | POA: Diagnosis not present

## 2018-06-01 DIAGNOSIS — L97518 Non-pressure chronic ulcer of other part of right foot with other specified severity: Secondary | ICD-10-CM | POA: Diagnosis not present

## 2018-06-01 DIAGNOSIS — I70233 Atherosclerosis of native arteries of right leg with ulceration of ankle: Secondary | ICD-10-CM | POA: Diagnosis not present

## 2018-06-01 DIAGNOSIS — L97329 Non-pressure chronic ulcer of left ankle with unspecified severity: Secondary | ICD-10-CM | POA: Diagnosis not present

## 2018-06-01 DIAGNOSIS — I70243 Atherosclerosis of native arteries of left leg with ulceration of ankle: Secondary | ICD-10-CM | POA: Diagnosis not present

## 2018-06-01 DIAGNOSIS — L97328 Non-pressure chronic ulcer of left ankle with other specified severity: Secondary | ICD-10-CM | POA: Diagnosis not present

## 2018-06-01 DIAGNOSIS — L97318 Non-pressure chronic ulcer of right ankle with other specified severity: Secondary | ICD-10-CM | POA: Diagnosis not present

## 2018-06-01 DIAGNOSIS — L97519 Non-pressure chronic ulcer of other part of right foot with unspecified severity: Secondary | ICD-10-CM | POA: Diagnosis not present

## 2018-06-02 ENCOUNTER — Other Ambulatory Visit: Payer: Self-pay | Admitting: Nurse Practitioner

## 2018-06-07 ENCOUNTER — Telehealth (HOSPITAL_COMMUNITY): Payer: Self-pay | Admitting: Physical Therapy

## 2018-06-07 NOTE — Telephone Encounter (Signed)
Called patient to inform him of wound dressings left here at clinic.  Informed pt he can come by and pick them up and given hours when staff would be here (Mon/Wed/Fri) from 8-12.  Pt verbalized understanding and will call when arrives to have clinician run the bag out to his car (locked in wound room).  Teena Irani, PTA/CLT [phone]

## 2018-06-23 DIAGNOSIS — R972 Elevated prostate specific antigen [PSA]: Secondary | ICD-10-CM | POA: Diagnosis not present

## 2018-06-28 DIAGNOSIS — N319 Neuromuscular dysfunction of bladder, unspecified: Secondary | ICD-10-CM | POA: Diagnosis not present

## 2018-06-28 DIAGNOSIS — N390 Urinary tract infection, site not specified: Secondary | ICD-10-CM | POA: Diagnosis not present

## 2018-06-28 DIAGNOSIS — R339 Retention of urine, unspecified: Secondary | ICD-10-CM | POA: Diagnosis not present

## 2018-06-29 ENCOUNTER — Other Ambulatory Visit: Payer: Self-pay

## 2018-06-29 ENCOUNTER — Other Ambulatory Visit: Payer: Self-pay | Admitting: Urology

## 2018-06-29 DIAGNOSIS — R972 Elevated prostate specific antigen [PSA]: Secondary | ICD-10-CM

## 2018-06-29 DIAGNOSIS — L97918 Non-pressure chronic ulcer of unspecified part of right lower leg with other specified severity: Secondary | ICD-10-CM | POA: Diagnosis not present

## 2018-06-29 DIAGNOSIS — L97919 Non-pressure chronic ulcer of unspecified part of right lower leg with unspecified severity: Secondary | ICD-10-CM | POA: Diagnosis not present

## 2018-06-29 DIAGNOSIS — L89510 Pressure ulcer of right ankle, unstageable: Secondary | ICD-10-CM

## 2018-06-29 DIAGNOSIS — N312 Flaccid neuropathic bladder, not elsewhere classified: Secondary | ICD-10-CM | POA: Diagnosis not present

## 2018-06-29 DIAGNOSIS — L97929 Non-pressure chronic ulcer of unspecified part of left lower leg with unspecified severity: Secondary | ICD-10-CM | POA: Diagnosis not present

## 2018-06-29 DIAGNOSIS — L97928 Non-pressure chronic ulcer of unspecified part of left lower leg with other specified severity: Secondary | ICD-10-CM | POA: Diagnosis not present

## 2018-06-29 DIAGNOSIS — N5201 Erectile dysfunction due to arterial insufficiency: Secondary | ICD-10-CM | POA: Diagnosis not present

## 2018-06-29 DIAGNOSIS — I739 Peripheral vascular disease, unspecified: Secondary | ICD-10-CM | POA: Diagnosis not present

## 2018-07-06 ENCOUNTER — Telehealth (HOSPITAL_COMMUNITY): Payer: Self-pay | Admitting: Rehabilitation

## 2018-07-06 NOTE — Telephone Encounter (Signed)
The above patient or their representative was contacted and gave the following answers to these questions:         Do you have any of the following symptoms? No  Fever                    Cough                   Shortness of breath  Do  you have any of the following other symptoms? No   muscle pain         vomiting,        diarrhea        rash         weakness        red eye        abdominal pain         bruising          bruising or bleeding              joint pain           severe headache    Have you been in contact with someone who was or has been sick in the past 2 weeks? No  Yes                 Unsure                         Unable to assess   Does the person that you were in contact with have any of the following symptoms?   Cough         shortness of breath           muscle pain         vomiting,            diarrhea            rash            weakness           fever            red eye           abdominal pain           bruising  or  bleeding                joint pain                severe headache               Have you  or someone you have been in contact with traveled internationally in th last month?  No       If yes, which countries?   Have you  or someone you have been in contact with traveled outside [place] in th last month?  No       If yes, which state and city?   COMMENTS OR ACTION PLAN FOR THIS PATIENT:  Pt instructed to wear mask and come alone to appt

## 2018-07-07 ENCOUNTER — Encounter: Payer: Self-pay | Admitting: Vascular Surgery

## 2018-07-07 ENCOUNTER — Encounter: Payer: Self-pay | Admitting: *Deleted

## 2018-07-07 ENCOUNTER — Other Ambulatory Visit: Payer: Self-pay | Admitting: *Deleted

## 2018-07-07 ENCOUNTER — Ambulatory Visit (INDEPENDENT_AMBULATORY_CARE_PROVIDER_SITE_OTHER): Payer: PPO | Admitting: Vascular Surgery

## 2018-07-07 ENCOUNTER — Ambulatory Visit (HOSPITAL_COMMUNITY)
Admission: RE | Admit: 2018-07-07 | Discharge: 2018-07-07 | Disposition: A | Discharge status: Home / Self Care | Payer: PPO | Source: Ambulatory Visit | Attending: Vascular Surgery | Admitting: Vascular Surgery

## 2018-07-07 ENCOUNTER — Other Ambulatory Visit: Payer: Self-pay

## 2018-07-07 VITALS — BP 148/86 | HR 70 | Temp 97.3°F | Resp 20 | Ht 71.0 in | Wt 155.0 lb

## 2018-07-07 DIAGNOSIS — I70299 Other atherosclerosis of native arteries of extremities, unspecified extremity: Secondary | ICD-10-CM | POA: Diagnosis not present

## 2018-07-07 DIAGNOSIS — L97909 Non-pressure chronic ulcer of unspecified part of unspecified lower leg with unspecified severity: Secondary | ICD-10-CM | POA: Diagnosis not present

## 2018-07-07 DIAGNOSIS — L89510 Pressure ulcer of right ankle, unstageable: Secondary | ICD-10-CM | POA: Insufficient documentation
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
## 2018-07-08 ENCOUNTER — Telehealth: Payer: Self-pay | Admitting: *Deleted

## 2018-07-08 NOTE — Telephone Encounter (Signed)
Left voice mail message for patient to call back so that we can schedule his Covid testing prior to 07/16/2018 procedure.

## 2018-07-13 ENCOUNTER — Other Ambulatory Visit (HOSPITAL_COMMUNITY)
Admission: RE | Admit: 2018-07-13 | Discharge: 2018-07-13 | Disposition: A | Discharge status: Home / Self Care | Payer: PPO | Source: Ambulatory Visit | Attending: Vascular Surgery | Admitting: Vascular Surgery

## 2018-07-13 DIAGNOSIS — Z1159 Encounter for screening for other viral diseases: Secondary | ICD-10-CM | POA: Insufficient documentation

## 2018-07-14 LAB — NOVEL CORONAVIRUS, NAA (HOSP ORDER, SEND-OUT TO REF LAB; TAT 18-24 HRS): SARS-CoV-2, NAA: NOT DETECTED

## 2018-07-16 ENCOUNTER — Encounter (HOSPITAL_COMMUNITY)
Admission: RE | Disposition: A | Discharge status: Home / Self Care | Payer: Self-pay | Source: Home / Self Care | Attending: Vascular Surgery

## 2018-07-16 ENCOUNTER — Observation Stay (HOSPITAL_COMMUNITY)
Admission: RE | Admit: 2018-07-16 | Discharge: 2018-07-17 | Disposition: A | Discharge status: Home / Self Care | Payer: PPO | Attending: Vascular Surgery | Admitting: Vascular Surgery

## 2018-07-16 ENCOUNTER — Encounter (HOSPITAL_COMMUNITY): Payer: Self-pay | Admitting: Vascular Surgery

## 2018-07-16 ENCOUNTER — Observation Stay (HOSPITAL_COMMUNITY): Payer: PPO

## 2018-07-16 ENCOUNTER — Other Ambulatory Visit: Payer: Self-pay

## 2018-07-16 DIAGNOSIS — L89520 Pressure ulcer of left ankle, unstageable: Secondary | ICD-10-CM | POA: Diagnosis not present

## 2018-07-16 DIAGNOSIS — Z7982 Long term (current) use of aspirin: Secondary | ICD-10-CM | POA: Insufficient documentation

## 2018-07-16 DIAGNOSIS — I771 Stricture of artery: Secondary | ICD-10-CM | POA: Diagnosis not present

## 2018-07-16 DIAGNOSIS — G8220 Paraplegia, unspecified: Secondary | ICD-10-CM | POA: Insufficient documentation

## 2018-07-16 DIAGNOSIS — I739 Peripheral vascular disease, unspecified: Secondary | ICD-10-CM | POA: Diagnosis not present

## 2018-07-16 DIAGNOSIS — Z87442 Personal history of urinary calculi: Secondary | ICD-10-CM | POA: Diagnosis not present

## 2018-07-16 DIAGNOSIS — Z86711 Personal history of pulmonary embolism: Secondary | ICD-10-CM | POA: Insufficient documentation

## 2018-07-16 DIAGNOSIS — Z7902 Long term (current) use of antithrombotics/antiplatelets: Secondary | ICD-10-CM | POA: Diagnosis not present

## 2018-07-16 DIAGNOSIS — F17210 Nicotine dependence, cigarettes, uncomplicated: Secondary | ICD-10-CM | POA: Insufficient documentation

## 2018-07-16 DIAGNOSIS — E785 Hyperlipidemia, unspecified: Secondary | ICD-10-CM | POA: Diagnosis not present

## 2018-07-16 DIAGNOSIS — G4730 Sleep apnea, unspecified: Secondary | ICD-10-CM | POA: Diagnosis not present

## 2018-07-16 DIAGNOSIS — N319 Neuromuscular dysfunction of bladder, unspecified: Secondary | ICD-10-CM | POA: Diagnosis not present

## 2018-07-16 DIAGNOSIS — L98499 Non-pressure chronic ulcer of skin of other sites with unspecified severity: Secondary | ICD-10-CM

## 2018-07-16 DIAGNOSIS — G5603 Carpal tunnel syndrome, bilateral upper limbs: Secondary | ICD-10-CM | POA: Insufficient documentation

## 2018-07-16 DIAGNOSIS — L97909 Non-pressure chronic ulcer of unspecified part of unspecified lower leg with unspecified severity: Secondary | ICD-10-CM

## 2018-07-16 DIAGNOSIS — I10 Essential (primary) hypertension: Secondary | ICD-10-CM | POA: Diagnosis not present

## 2018-07-16 DIAGNOSIS — Z79899 Other long term (current) drug therapy: Secondary | ICD-10-CM | POA: Insufficient documentation

## 2018-07-16 DIAGNOSIS — K449 Diaphragmatic hernia without obstruction or gangrene: Secondary | ICD-10-CM | POA: Insufficient documentation

## 2018-07-16 DIAGNOSIS — G8929 Other chronic pain: Secondary | ICD-10-CM | POA: Diagnosis not present

## 2018-07-16 DIAGNOSIS — K592 Neurogenic bowel, not elsewhere classified: Secondary | ICD-10-CM | POA: Insufficient documentation

## 2018-07-16 DIAGNOSIS — L89529 Pressure ulcer of left ankle, unspecified stage: Secondary | ICD-10-CM | POA: Insufficient documentation

## 2018-07-16 DIAGNOSIS — L89519 Pressure ulcer of right ankle, unspecified stage: Secondary | ICD-10-CM | POA: Insufficient documentation

## 2018-07-16 DIAGNOSIS — M1990 Unspecified osteoarthritis, unspecified site: Secondary | ICD-10-CM | POA: Insufficient documentation

## 2018-07-16 DIAGNOSIS — I70299 Other atherosclerosis of native arteries of extremities, unspecified extremity: Secondary | ICD-10-CM

## 2018-07-16 DIAGNOSIS — I70209 Unspecified atherosclerosis of native arteries of extremities, unspecified extremity: Secondary | ICD-10-CM

## 2018-07-16 DIAGNOSIS — I70233 Atherosclerosis of native arteries of right leg with ulceration of ankle: Secondary | ICD-10-CM | POA: Diagnosis not present

## 2018-07-16 DIAGNOSIS — M869 Osteomyelitis, unspecified: Secondary | ICD-10-CM

## 2018-07-16 HISTORY — PX: PERIPHERAL VASCULAR INTERVENTION: CATH118257

## 2018-07-16 HISTORY — PX: LOWER EXTREMITY ANGIOGRAPHY: CATH118251

## 2018-07-16 LAB — CBC
HCT: 38.6 % — ABNORMAL LOW (ref 39.0–52.0)
Hemoglobin: 12.6 g/dL — ABNORMAL LOW (ref 13.0–17.0)
MCH: 28.1 pg (ref 26.0–34.0)
MCHC: 32.6 g/dL (ref 30.0–36.0)
MCV: 86.2 fL (ref 80.0–100.0)
Platelets: 362 10*3/uL (ref 150–400)
RBC: 4.48 MIL/uL (ref 4.22–5.81)
RDW: 14.3 % (ref 11.5–15.5)
WBC: 11.5 10*3/uL — ABNORMAL HIGH (ref 4.0–10.5)
nRBC: 0 % (ref 0.0–0.2)

## 2018-07-16 LAB — POCT I-STAT, CHEM 8
BUN: 14 mg/dL (ref 8–23)
Calcium, Ion: 1.14 mmol/L — ABNORMAL LOW (ref 1.15–1.40)
Chloride: 104 mmol/L (ref 98–111)
Creatinine, Ser: 0.5 mg/dL — ABNORMAL LOW (ref 0.61–1.24)
Glucose, Bld: 100 mg/dL — ABNORMAL HIGH (ref 70–99)
HCT: 39 % (ref 39.0–52.0)
Hemoglobin: 13.3 g/dL (ref 13.0–17.0)
Potassium: 3.7 mmol/L (ref 3.5–5.1)
Sodium: 138 mmol/L (ref 135–145)
TCO2: 23 mmol/L (ref 22–32)

## 2018-07-16 LAB — POCT ACTIVATED CLOTTING TIME
Activated Clotting Time: 219 seconds
Activated Clotting Time: 257 seconds
Activated Clotting Time: 186 seconds

## 2018-07-16 LAB — CREATININE, SERUM
Creatinine, Ser: 0.59 mg/dL — ABNORMAL LOW (ref 0.61–1.24)
GFR calc Af Amer: >60 mL/min (ref >60)
GFR calc non Af Amer: >60 mL/min (ref >60)

## 2018-07-16 SURGERY — LOWER EXTREMITY ANGIOGRAPHY
Anesthesia: LOCAL

## 2018-07-16 MED ORDER — SODIUM CHLORIDE 0.9% FLUSH: 3.0000 mL | INTRAVENOUS | Status: DC | PRN

## 2018-07-16 MED ORDER — MELOXICAM 7.5 MG PO TABS
15.0000 mg | ORAL_TABLET | Freq: Every day | ORAL | Status: DC
  Administered 2018-07-16 – 2018-07-17 (×2): 15 mg via ORAL
  Filled 2018-07-16 (×2): qty 2

## 2018-07-16 MED ORDER — LIDOCAINE HCL (PF) 1 % IJ SOLN
INTRAMUSCULAR | Status: AC
  Filled 2018-07-16: qty 30

## 2018-07-16 MED ORDER — NIACIN ER (ANTIHYPERLIPIDEMIC) 500 MG PO TBCR
1000.0000 mg | EXTENDED_RELEASE_TABLET | Freq: Every day | ORAL | Status: DC
  Administered 2018-07-16: 1000 mg via ORAL
  Filled 2018-07-16 (×2): qty 2

## 2018-07-16 MED ORDER — HEPARIN SODIUM (PORCINE) 1000 UNIT/ML IJ SOLN
INTRAMUSCULAR | Status: DC | PRN
  Administered 2018-07-16: 7000 [IU] via INTRAVENOUS

## 2018-07-16 MED ORDER — HEPARIN (PORCINE) IN NACL 1000-0.9 UT/500ML-% IV SOLN
INTRAVENOUS | Status: DC | PRN
  Administered 2018-07-16 (×2): 500 mL

## 2018-07-16 MED ORDER — HYDRALAZINE HCL 20 MG/ML IJ SOLN: 5.0000 mg | INTRAMUSCULAR | Status: DC | PRN

## 2018-07-16 MED ORDER — SODIUM CHLORIDE 0.9% FLUSH
3.0000 mL | Freq: Two times a day (BID) | INTRAVENOUS | Status: DC
  Administered 2018-07-16: 3 mL via INTRAVENOUS

## 2018-07-16 MED ORDER — LIDOCAINE HCL (PF) 1 % IJ SOLN
INTRAMUSCULAR | Status: DC | PRN
  Administered 2018-07-16: 12 mL

## 2018-07-16 MED ORDER — CLOPIDOGREL BISULFATE 300 MG PO TABS
ORAL_TABLET | ORAL | Status: AC
  Filled 2018-07-16: qty 1

## 2018-07-16 MED ORDER — HEPARIN (PORCINE) IN NACL 1000-0.9 UT/500ML-% IV SOLN
INTRAVENOUS | Status: AC
  Filled 2018-07-16: qty 1000

## 2018-07-16 MED ORDER — ASPIRIN EC 81 MG PO TBEC
81.0000 mg | DELAYED_RELEASE_TABLET | Freq: Every day | ORAL | Status: DC
  Administered 2018-07-16 – 2018-07-17 (×2): 81 mg via ORAL
  Filled 2018-07-16 (×2): qty 1

## 2018-07-16 MED ORDER — SODIUM CHLORIDE 0.9 % IV SOLN: 250.0000 mL | INTRAVENOUS | Status: DC | PRN

## 2018-07-16 MED ORDER — HEPARIN SODIUM (PORCINE) 1000 UNIT/ML IJ SOLN
INTRAMUSCULAR | Status: AC
  Filled 2018-07-16: qty 1

## 2018-07-16 MED ORDER — ATORVASTATIN CALCIUM 10 MG PO TABS
10.0000 mg | ORAL_TABLET | Freq: Every day | ORAL | Status: DC
  Administered 2018-07-16: 10 mg via ORAL
  Filled 2018-07-16: qty 1

## 2018-07-16 MED ORDER — SODIUM CHLORIDE 0.9 % WEIGHT BASED INFUSION: 1.0000 mL/kg/h | INTRAVENOUS | Status: AC

## 2018-07-16 MED ORDER — TESTOSTERONE CYPIONATE 200 MG/ML IM SOLN: 150.0000 mg | INTRAMUSCULAR | Status: DC

## 2018-07-16 MED ORDER — ACETAMINOPHEN 325 MG PO TABS: 650.0000 mg | ORAL_TABLET | ORAL | Status: DC | PRN

## 2018-07-16 MED ORDER — CLOPIDOGREL BISULFATE 300 MG PO TABS
ORAL_TABLET | ORAL | Status: DC | PRN
  Administered 2018-07-16: 300 mg via ORAL

## 2018-07-16 MED ORDER — IODIXANOL 320 MG/ML IV SOLN
INTRAVENOUS | Status: DC | PRN
  Administered 2018-07-16: 09:00:00 180 mL via INTRA_ARTERIAL

## 2018-07-16 MED ORDER — CLOPIDOGREL BISULFATE 75 MG PO TABS
75.0000 mg | ORAL_TABLET | Freq: Every day | ORAL | Status: DC
  Administered 2018-07-17: 75 mg via ORAL
  Filled 2018-07-16: qty 1

## 2018-07-16 MED ORDER — LABETALOL HCL 5 MG/ML IV SOLN: 10.0000 mg | INTRAVENOUS | Status: DC | PRN

## 2018-07-16 MED ORDER — TRIMETHOPRIM 100 MG PO TABS
100.0000 mg | ORAL_TABLET | Freq: Every day | ORAL | Status: DC
  Administered 2018-07-16 – 2018-07-17 (×2): 100 mg via ORAL
  Filled 2018-07-16 (×2): qty 1

## 2018-07-16 MED ORDER — CYCLOBENZAPRINE HCL 10 MG PO TABS: 10.0000 mg | ORAL_TABLET | Freq: Three times a day (TID) | ORAL | Status: DC | PRN

## 2018-07-16 MED ORDER — SODIUM CHLORIDE 0.9 % IV SOLN
INTRAVENOUS | Status: DC
  Administered 2018-07-16: 07:00:00 via INTRAVENOUS

## 2018-07-16 MED ORDER — ENOXAPARIN SODIUM 40 MG/0.4ML ~~LOC~~ SOLN
40.0000 mg | SUBCUTANEOUS | Status: DC
  Administered 2018-07-17: 40 mg via SUBCUTANEOUS
  Filled 2018-07-16: qty 0.4

## 2018-07-16 MED ORDER — ONDANSETRON HCL 4 MG/2ML IJ SOLN: 4.0000 mg | Freq: Four times a day (QID) | INTRAMUSCULAR | Status: DC | PRN

## 2018-07-16 SURGICAL SUPPLY — 18 items
BALLN MUSTANG 5.0X40 75 (BALLOONS) ×3
BALLOON MUSTANG 5.0X40 75 (BALLOONS) ×2 IMPLANT
CATH ANGIO 5F PIGTAIL 65CM (CATHETERS) ×3 IMPLANT
CATH STRAIGHT 5FR 65CM (CATHETERS) ×6 IMPLANT
KIT ENCORE 26 ADVANTAGE (KITS) ×3 IMPLANT
KIT MICROPUNCTURE NIT STIFF (SHEATH) ×3 IMPLANT
KIT PV (KITS) ×3 IMPLANT
SHEATH PINNACLE 5F 10CM (SHEATH) ×3 IMPLANT
SHEATH PINNACLE ST 6F 45CM (SHEATH) ×3 IMPLANT
SHEATH PROBE COVER 6X72 (BAG) ×3 IMPLANT
STENT ABSOLUTE PRO 6X40X135 (Permanent Stent) ×3 IMPLANT
SYR MEDRAD MARK V 150ML (SYRINGE) ×3 IMPLANT
TAPE VIPERTRACK RADIOPAQ (MISCELLANEOUS) ×2 IMPLANT
TAPE VIPERTRACK RADIOPAQUE (MISCELLANEOUS) ×1
TRANSDUCER W/STOPCOCK (MISCELLANEOUS) ×3 IMPLANT
TRAY PV CATH (CUSTOM PROCEDURE TRAY) ×3 IMPLANT
WIRE BENTSON .035X145CM (WIRE) ×3 IMPLANT
WIRE ROSEN-J .035X260CM (WIRE) ×3 IMPLANT

## 2018-07-16 NOTE — Progress Notes (Signed)
Site area: Right groin a 6 french long arterial sheath was removed  Site Prior to Removal:  Level 0  Pressure Applied For 20 MINUTES    Bedrest Beginning at 1040am  Manual:   Yes.    Patient Status During Pull:  stable  Post Pull Groin Site:  Level 0  Post Pull Instructions Given:  Yes.    Post Pull Pulses Present:  Yes.    Dressing Applied:  Yes.    Comments:

## 2018-07-16 NOTE — Progress Notes (Signed)
Made aware that patient drove himself to hospital.  Dr. Scot Dock made aware and involved in patient discharge planning.  RN also made Dr. Scot Dock aware that we have been unable to reach friend Cecille Rubin) who patient stated would be staying with him overnight.  If unable to reach [facility] Dr. Scot Dock will admit
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
## 2018-07-16 NOTE — Interval H&P Note (Signed)
History and Physical Interval Note:  07/16/2018 7:36 AM  Dustin Medina  has presented today for surgery, with the diagnosis of pvd ulcer.  The various methods of treatment have been discussed with the patient and family. After consideration of risks, benefits and other options for treatment, the patient has consented to  Procedure(s): LOWER EXTREMITY ANGIOGRAPHY (N/A) as a surgical intervention.  The patient's history has been reviewed, patient examined, no change in status, stable for surgery.  I have reviewed the patient's chart and labs.  Questions were answered to the patient's satisfaction.     [name]

## 2018-07-16 NOTE — Progress Notes (Signed)
Pt states presence of sacral bedsore. Pt has dressed it with a maxi pad. Unable to assess due to leaving for procedure.

## 2018-07-16 NOTE — Op Note (Signed)
PATIENT: Dustin Medina      MRN: [id number] DOB: 1950-09-13    DATE OF PROCEDURE: 07/16/2018  INDICATIONS:    Dustin Medina is a 68 y.o. male who presented with a nonhealing wound on his right lateral malleolus.  He had evidence of significant infrainguinal arterial occlusive disease and presents for arteriography and possible intervention.  PROCEDURE:    1.  Ultrasound-guided access to the right common femoral artery 2.  Aortogram with bilateral iliac arteriogram and bilateral lower extremity runoff 3.  Angioplasty and stenting of the right common iliac artery (6 mm x 40 mm self-expanding stent, postdilatation 5 mm x 40 mm balloon)  SURGEON: Judeth Cornfield. Scot Dock, MD, FACS  ANESTHESIA: Local   EBL: Minimal  TECHNIQUE: Patient was brought to the peripheral vascular lab and was positioned supine.  Both groins were prepped and draped in the usual sterile fashion.  Initially I was going to try to access the left groin however the patient has a contracture at his hip and I really could not adequately visualize the common femoral artery or bifurcation.  I therefore elected to cannulate the right side as I suspect that he did have inflow disease on the right foot which we could address from that side.  Under ultrasound guidance, after the skin was anesthetized, I cannulated the right common femoral artery with a micropuncture needle and a micropuncture sheath was introduced over a wire.  This was exchanged for a 5 [place] sheath over a Bentson wire.  By ultrasound the femoral artery was patent. A real-time image was obtained and sent to the server.  A pigtail catheter was positioned at the L1 vertebral body and flush aortogram obtained.  The catheter was then positioned above the aortic bifurcation and an oblique iliac projection was obtained.  Bilateral lower extremity runoff films were obtained.  Next the pigtail catheter was removed over a wire.  I advanced a straight catheter over the wire and  did a pullback pressure in the right iliac system.  There was a 10 mmHg resting pressure gradient across the area where there was moderate diffuse disease.  I elected to address this with balloon angioplasty.  The short 5 French sheath was exchanged for a destination 6 [place] sheath.  The patient was then heparinized.  ACT was monitored throughout the procedure.  I initially selected a 6 mm x 40 mm self-expanding stent which was positioned across the disease after the sheath was retracted.  The stent was deployed without difficulty.  Postdilatation was done with a 5 mm x 40 mm balloon to 10 atm.  Completion film showed no residual stenosis.  FINDINGS:   1.  There are 2 renal arteries on the right and a single renal artery on the left.  The infrarenal aorta is widely patent. 2.  On the right side, which is the side of concern, there was a moderate 70% right common iliac artery stenosis which was successfully addressed with balloon angioplasty and stenting as described above.  Below that the external iliac artery, common femoral, deep femoral, and proximal superficial femoral arteries are patent.  There is moderate to severe disease in the superficial femoral artery in the mid thigh.  The distal superficial femoral artery and popliteal artery and proximal tibial vessels are all patent. 3.  On the left side the common iliac, external iliac, hypogastric, common femoral, deep femoral, superficial femoral, popliteal, anterior tibial, tibioperoneal trunk, posterior tibial, and peroneal arteries are patent.  CLINICAL NOTE: I  will see the patient back in the office.  If it is felt that he needs further intervention I think we could potentially try to address the disease in the superficial femoral artery with angioplasty and drug-coated balloon angioplasty.  However technically this could be challenging in that I think we could try to do it from the left side despite his hip contracture alternatively we could do a  retrograde cannulation on the right.  I will discuss this with him further in the office if this is necessary.  I did briefly discuss this with him today.  As the patient does not have a somewhat confirmed to stay with him tonight we will keep him overnight for observation.  [name], MD, FACS [facility]  DATE OF DICTATION:   07/16/2018

## 2018-07-17 ENCOUNTER — Other Ambulatory Visit: Payer: Self-pay | Admitting: Nurse Practitioner

## 2018-07-17 DIAGNOSIS — I739 Peripheral vascular disease, unspecified: Secondary | ICD-10-CM | POA: Diagnosis not present

## 2018-07-17 MED ORDER — ATORVASTATIN CALCIUM 10 MG PO TABS: 10.0000 mg | ORAL_TABLET | Freq: Every day | ORAL | 3 refills | Status: DC

## 2018-07-17 MED ORDER — ASPIRIN 81 MG PO TBEC: 81.0000 mg | DELAYED_RELEASE_TABLET | Freq: Every day | ORAL | Status: DC

## 2018-07-17 MED ORDER — CLOPIDOGREL BISULFATE 75 MG PO TABS: 75.0000 mg | ORAL_TABLET | Freq: Every day | ORAL | 6 refills | Status: DC
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
## 2018-07-17 NOTE — Progress Notes (Signed)
Pt stable, dc home via wheelchair, NT accompanied to entrance 

## 2018-07-17 NOTE — Discharge Instructions (Addendum)
° °  [facility]  Discharge Instructions  Lower Extremity Angiogram; Angioplasty/Stenting  Please refer to the following instructions for your post-procedure care. Your surgeon or physician assistant will discuss any changes with you.  Activity  Avoid lifting more than 8 pounds (1 gallons of milk) for 72 hours (3 days) after your procedure. You may walk as much as you can tolerate. It's OK to drive after 72 hours.  Hold on stretching machine for one week.  Bathing/Showering  You may shower the day after your procedure. If you have a bandage, you may remove it at 24- 48 hours. Clean your incision site with mild soap and water. Pat the area dry with a clean towel.  Diet  Resume your pre-procedure diet. There are no special food restrictions following this procedure. All patients with peripheral vascular disease should follow a low fat/low cholesterol diet. In order to heal from your surgery, it is CRITICAL to get adequate nutrition. Your body requires vitamins, minerals, and protein. Vegetables are the best source of vitamins and minerals. Vegetables also provide the perfect balance of protein. Processed food has little nutritional value, so try to avoid this.  Medications  Resume taking all of your medications unless your doctor tells you not to. If your incision is causing pain, you may take over-the-counter pain relievers such as acetaminophen (Tylenol)  Follow Up  Follow up will be arranged at the time of your procedure. You may have an office visit scheduled or may be scheduled for surgery. Ask your surgeon if you have any questions. Make appointment with wound care center for the next week to evaluate wounds. WOC recommendations:  Cleanse wounds to bilateral ankles and sacrum with NS and pat dry.  Apply small piece of Aquacel Ag to wound bed. Cover with silicone foam.  Change M/W/F  Please call us immediately for any of the following  conditions: Severe or worsening pain your legs or feet at rest or with walking. Increased pain, redness, drainage at your groin puncture site. Fever of 101 degrees or higher. If you have any mild or slow bleeding from your puncture site: lie down, apply firm constant pressure over the area with a piece of gauze or a clean wash cloth for 30 minutes- no peeking!, call 911 right away if you are still bleeding after 30 minutes, or if the bleeding is heavy and unmanageable.  Reduce your risk factors of vascular disease:   Stop smoking. If you would like help call [facility] at 1-800-QUIT-NOW 431-604-5456) or [place] at [phone].  Manage your cholesterol  Maintain a desired weight  Control your diabetes  Keep your blood pressure down   If you have any questions, please call the office at [phone]

## 2018-07-17 NOTE — Progress Notes (Addendum)
  Progress Note    07/17/2018 8:10 AM 1 Day Post-Op  Subjective:  No complaints  Afebrile VSS  Vitals:   07/16/18 2000 07/17/18 0503  BP:  130/63  Pulse:  86  Resp: 18 18  Temp:  97.9 F (36.6 C)  SpO2:  99%    Physical Exam: Cardiac:  regular Lungs:  Non labored  Incisions:  Right groin soft without hematoma; some ecchymosis in the suprapubic area Extremities:  Easily palpable right PT pulse   CBC    Component Value Date/Time   WBC 11.5 (H) 07/16/2018 1347   RBC 4.48 07/16/2018 1347   HGB 12.6 (L) 07/16/2018 1347   HCT 38.6 (L) 07/16/2018 1347   PLT 362 07/16/2018 1347   MCV 86.2 07/16/2018 1347   MCH 28.1 07/16/2018 1347   MCHC 32.6 07/16/2018 1347   RDW 14.3 07/16/2018 1347   RDW 14.5 06/20/2013 1134   LYMPHSABS 0.8 09/28/2013 1940   MONOABS 0.7 09/28/2013 1940   EOSABS 0.0 09/28/2013 1940   BASOSABS 0.0 09/28/2013 1940    BMET    Component Value Date/Time   NA 138 07/16/2018 0645   NA 138 07/16/2016 1032   K 3.7 07/16/2018 0645   CL 104 07/16/2018 0645   CO2 24 04/09/2017 1410   GLUCOSE 100 (H) 07/16/2018 0645   BUN 14 07/16/2018 0645   BUN 12 07/16/2016 1032   CREATININE 0.59 (L) 07/16/2018 1347   CALCIUM 8.8 (L) 04/09/2017 1410   GFRNONAA >60 07/16/2018 1347   GFRAA >60 07/16/2018 1347    INR No results found for: INR   Intake/Output Summary (Last 24 hours) at 07/17/2018 0810 Last data filed at 07/17/2018 0100 Gross per 24 hour  Intake 240 ml  Output 1725 ml  Net -1485 ml     Assessment:  68 y.o. male is s/p:  1.  Ultrasound-guided access to the right common femoral artery 2.  Aortogram with bilateral iliac arteriogram and bilateral lower extremity runoff 3.  Angioplasty and stenting of the right common iliac artery (6 mm x 40 mm self-expanding stent, postdilatation 5 mm x 40 mm balloon)  1 Day Post-Op  Plan: -pt doing well this morning-he has a palpable right PT pulse.   -right groin is soft without hematoma.  There is  ecchymosis in the suprapubic area. -discharge this morning on plavix/asa -f/u with Dr. Scot Dock in 2-4 weeks. -discussed with pt to f/u at the wound care center [facility] wound center in [place]) in the next week given WOC recommendations.   -discussed with pt no strenuous activity for 1-2 weeks given right groin access.  He expressed understanding.   Leontine Locket, PA-C [facility] [phone] 07/17/2018 8:10 AM  I have seen and evaluated the patient. I agree with the PA note as documented above. A little eccymosis over right groin toward pubis.  No hematoma in groin.  2+ palpable R PT pulse.  Will plan for d/c today.  Plan to follow-up with Dr. Scot Dock in one month after right common iliac artery stent.  ASA/Plavix/Statin.  Marty Heck, MD [facility] [place] Office: [phone] Pager: [phone]

## 2018-07-20 DIAGNOSIS — L97519 Non-pressure chronic ulcer of other part of right foot with unspecified severity: Secondary | ICD-10-CM | POA: Diagnosis not present

## 2018-07-20 DIAGNOSIS — I739 Peripheral vascular disease, unspecified: Secondary | ICD-10-CM | POA: Diagnosis not present

## 2018-07-20 DIAGNOSIS — L97918 Non-pressure chronic ulcer of unspecified part of right lower leg with other specified severity: Secondary | ICD-10-CM | POA: Diagnosis not present

## 2018-07-20 DIAGNOSIS — I70239 Atherosclerosis of native arteries of right leg with ulceration of unspecified site: Secondary | ICD-10-CM | POA: Diagnosis not present

## 2018-07-20 DIAGNOSIS — I70249 Atherosclerosis of native arteries of left leg with ulceration of unspecified site: Secondary | ICD-10-CM | POA: Diagnosis not present

## 2018-07-20 DIAGNOSIS — L97928 Non-pressure chronic ulcer of unspecified part of left lower leg with other specified severity: Secondary | ICD-10-CM | POA: Diagnosis not present

## 2018-07-29 DIAGNOSIS — N319 Neuromuscular dysfunction of bladder, unspecified: Secondary | ICD-10-CM | POA: Diagnosis not present

## 2018-07-29 DIAGNOSIS — R339 Retention of urine, unspecified: Secondary | ICD-10-CM | POA: Diagnosis not present

## 2018-07-29 DIAGNOSIS — N390 Urinary tract infection, site not specified: Secondary | ICD-10-CM | POA: Diagnosis not present

## 2018-08-03 ENCOUNTER — Inpatient Hospital Stay: Admission: RE | Admit: 2018-08-03 | Payer: PPO | Source: Ambulatory Visit

## 2018-08-06 ENCOUNTER — Ambulatory Visit (INDEPENDENT_AMBULATORY_CARE_PROVIDER_SITE_OTHER): Payer: PPO | Admitting: Family Medicine

## 2018-08-06 ENCOUNTER — Other Ambulatory Visit: Payer: Self-pay

## 2018-08-06 ENCOUNTER — Encounter: Payer: Self-pay | Admitting: Family Medicine

## 2018-08-06 VITALS — BP 140/74 | HR 84 | Temp 97.7°F

## 2018-08-06 DIAGNOSIS — L97529 Non-pressure chronic ulcer of other part of left foot with unspecified severity: Secondary | ICD-10-CM | POA: Diagnosis not present

## 2018-08-06 DIAGNOSIS — R300 Dysuria: Secondary | ICD-10-CM | POA: Diagnosis not present

## 2018-08-06 DIAGNOSIS — L97519 Non-pressure chronic ulcer of other part of right foot with unspecified severity: Secondary | ICD-10-CM | POA: Diagnosis not present

## 2018-08-06 DIAGNOSIS — N390 Urinary tract infection, site not specified: Secondary | ICD-10-CM | POA: Diagnosis not present

## 2018-08-06 LAB — URINALYSIS, COMPLETE
Glucose, UA: NEGATIVE
Nitrite, UA: POSITIVE — AB
RBC, UA: NEGATIVE
Specific Gravity, UA: 1.025 (ref 1.005–1.030)
Urobilinogen, Ur: 2 mg/dL — ABNORMAL HIGH (ref 0.2–1.0)
pH, UA: 6 (ref 5.0–7.5)

## 2018-08-06 LAB — MICROSCOPIC EXAMINATION: Renal Epithel, UA: NONE SEEN /hpf

## 2018-08-06 MED ORDER — CIPROFLOXACIN HCL 500 MG PO TABS: 500.0000 mg | ORAL_TABLET | Freq: Two times a day (BID) | ORAL | 0 refills | Status: DC

## 2018-08-06 NOTE — Progress Notes (Signed)
LGX:QJJHER, [name], FNP Chief Complaint  Patient presents with  . Urinary Tract Infection    Current Issues:  Presents with 3 days of dysuria and cloudy and malodorous urine. Associated symptoms include:  cloudy urine, hematuria, and lower abdominal pain. Pt is a paraplegic and self caths 4 times per day. Pt reports a low grade fever yesterday that has resolved. No flank pain.    There is a previous history of of similar symptoms. Sexually active:  No   No concern for STI.  Prior to Admission medications   Medication Sig Start Date End Date Taking? Authorizing Provider  aspirin EC 81 MG EC tablet Take 1 tablet (81 mg total) by mouth daily. 07/17/18  Yes Rhyne, Samantha J, PA-C  atorvastatin (LIPITOR) 10 MG tablet Take 1 tablet (10 mg total) by mouth daily at 6 PM. 07/17/18  Yes Rhyne, Samantha J, PA-C  clopidogrel (PLAVIX) 75 MG tablet Take 1 tablet (75 mg total) by mouth daily with breakfast. 07/17/18  Yes Rhyne, Samantha J, PA-C  cyclobenzaprine (FLEXERIL) 10 MG tablet TAKE 1 TABLET (10 MG TOTAL) BY MOUTH 3 (THREE) TIMES DAILY AS NEEDED FOR MUSCLE SPASMS. Patient taking differently: Take 10 mg by mouth 3 (three) times daily as needed for muscle spasms.  04/26/14  Yes Keiper, [name], FNP  ibuprofen (ADVIL) 800 MG tablet Take 800 mg by mouth every 8 (eight) hours as needed for moderate pain.  05/19/18  Yes [provider]  meloxicam (MOBIC) 15 MG tablet TAKE 1 TABLET (15 MG TOTAL) BY MOUTH DAILY AS NEEDED. Patient taking differently: Take 15 mg by mouth daily.  06/03/18  Yes Hassell Done, [name], FNP  Multiple Vitamin (MULITIVITAMIN WITH MINERALS) TABS Take 1 tablet by mouth daily.    Yes [provider]  Nerve Stimulator (PRO COMFORT TENS ELECTRODES) MISC Apply 2 patches topically daily as needed. 07/16/17  Yes Hassell Done [name], FNP  Nerve Stimulator (PRO COMFORT TENS UNIT) DEVI Apply 1 Units topically daily as needed. 07/16/17  Yes Delagarza, [name],  FNP  niacin (NIASPAN) 1000 MG CR tablet TAKE 1 TABLET (1,000 MG TOTAL) BY MOUTH AT BEDTIME. Patient taking differently: Take 1,000 mg by mouth at bedtime.  06/03/18  Yes Bleecker, [name], FNP  nitrofurantoin, macrocrystal-monohydrate, (MACROBID) 100 MG capsule Take 1 capsule (100 mg total) by mouth 2 (two) times daily. 1 po BId 05/10/18  Yes Hassell Done, [name], FNP  promethazine (PHENERGAN) 25 MG tablet Take 1 tablet (25 mg total) by mouth every 8 (eight) hours as needed for nausea or vomiting. 06/28/15  Yes Dettinger, Fransisca Kaufmann, MD  testosterone cypionate (DEPOTESTOSTERONE CYPIONATE) 200 MG/ML injection Inject 0.75 mLs (150 mg total) into the muscle every 14 (fourteen) days. 07/19/18  Yes Cancro, [name], FNP  trimethoprim (TRIMPEX) 100 MG tablet TAKE 1 TABLET BY MOUTH EVERY DAY Patient taking differently: Take 100 mg by mouth daily.  12/03/17  Yes Hawks, Alyse Low A, FNP  valsartan-hydrochlorothiazide (DIOVAN-HCT) 320-25 MG tablet TAKE 1 TABLET BY MOUTH EVERY DAY 05/24/18  Yes Hawks, Christy A, FNP  ciprofloxacin (CIPRO) 500 MG tablet Take 1 tablet (500 mg total) by mouth 2 (two) times daily for 7 days. 08/06/18 08/13/18  Baruch Gouty, FNP    Review of Systems Review of Systems  Constitutional: Positive for chills and fever. Negative for diaphoresis and malaise/fatigue.  Respiratory: Negative for shortness of breath and wheezing.   Cardiovascular: Negative for chest pain and leg swelling.  Gastrointestinal: Positive for abdominal pain (lower abdominal pressure at times). Negative for blood  in stool, constipation, diarrhea, melena, nausea and vomiting.  Genitourinary: Positive for dysuria. Negative for flank pain, frequency, hematuria and urgency.  Neurological: Negative for dizziness, weakness and headaches.  All other systems reviewed and are negative.    PE:  BP 140/74   Pulse 84   Temp 97.7 F (36.5 C) (Oral)  Physical Exam  Constitutional: He is oriented to person, place, and  time and well-developed, well-nourished, and in no distress. No distress.  HENT:  Head: Normocephalic and atraumatic.  Eyes: Pupils are equal, round, and reactive to light.  Neck: Neck supple.  Cardiovascular: Normal rate, regular rhythm and normal heart sounds. Exam reveals no gallop and no friction rub.  No murmur heard. Pulmonary/Chest: Effort normal and breath sounds normal. No respiratory distress.  Abdominal: Soft. Normal appearance and bowel sounds are normal. There is no abdominal tenderness. There is no CVA tenderness.  Musculoskeletal:     Comments: Pt paraplegic in wheelchair.   Neurological: He is alert and oriented to person, place, and time.  Skin: Skin is warm and dry. He is not diaphoretic.  Psychiatric: Mood, memory, affect and judgment normal.     Results for orders placed or performed in visit on 08/06/18  Microscopic Examination   URINE  Result Value Ref Range   WBC, UA 11-30 (A) 0 - 5 /hpf   RBC 0-2 0 - 2 /hpf   Epithelial Cells (non renal) 0-10 0 - 10 /hpf   Renal Epithel, UA None seen None seen /hpf   Crystals Present N/A   Crystal Type Calcium Oxalate N/A   Bacteria, UA Many (A) None seen/Few  Urinalysis, Complete  Result Value Ref Range   Specific Gravity, UA 1.025 1.005 - 1.030   pH, UA 6.0 5.0 - 7.5   Color, UA Amber (A) Yellow   Appearance Ur Cloudy (A) Clear   Leukocytes,UA 1+ (A) Negative   Protein,UA 1+ (A) Negative/Trace   Glucose, UA Negative Negative   Ketones, UA Trace (A) Negative   RBC, UA Negative Negative   Bilirubin, UA 1+ (A) Negative   Urobilinogen, Ur 2.0 (H) 0.2 - 1.0 mg/dL   Nitrite, UA Positive (A) Negative   Microscopic Examination See below:    Urinalysis: many bacteria, 1+ bili, trace ketones, 1+ protein, positive nitrites, 1+ leukocytes, and calcium oxylate crystals present.   Assessment and Plan Devian was seen today for urinary tract infection.  Diagnoses and all orders for this visit:  Dysuria Urinalysis in office  indicates acute UTI with hematuria. Culture added.  -     Urinalysis, Complete -     Microscopic Examination  Complicated UTI (urinary tract infection) Acute UTI with hematuria. Will treat as complicated UTI because of self cathing. Culture pending and will change therapy if warranted. Symptomatic care discussed. Increase water intake and avoid bladder irritants. Report any new or worsening symptoms. Medications as prescribed. Repeat urine in 2 weeks.  -     ciprofloxacin (CIPRO) 500 MG tablet; Take 1 tablet (500 mg total) by mouth 2 (two) times daily for 7 days. -     Urine Culture  Return in about 2 weeks (around 08/20/2018), or if symptoms worsen or fail to improve, for UTI recheck.  The above assessment and management plan was discussed with the patient. The patient verbalized understanding of and has agreed to the management plan. Patient is aware to call the clinic if symptoms fail to improve or worsen. Patient is aware when to return to the clinic for  a follow-up visit. Patient educated on when it is appropriate to go to the emergency department.   Monia Pouch, FNP-C [facility] [address] [place], [place] [address] [phone]

## 2018-08-06 NOTE — Patient Instructions (Signed)
Acute Urinary Retention, Male    Acute urinary retention means that you cannot pee (urinate) at all, or that you pee too little and your bladder is not emptied completely. If it is not treated, it can lead to kidney damage or other serious problems.  Follow these instructions at home:  · Take over-the-counter and prescription medicines only as told by your doctor. Ask your doctor what medicines you should stay away from. Do not take any medicine unless your doctor says it is okay to do so.  · If you were sent home with a tube that drains the bladder (catheter), take care of it as told by your doctor.  · Drink enough fluid to keep your pee clear or pale yellow.  · If you were given an antibiotic, take it as told by your doctor. Do not stop taking the antibiotic even if you start to feel better.  · Do not use any products that contain nicotine or tobacco, such as cigarettes and e-cigarettes. If you need help quitting, ask your doctor.  · Watch for changes in your symptoms. Tell your doctor about them.  · If told, track changes in your blood pressure at home. Tell your doctor about them.  · Keep all follow-up visits as told by your doctor. This is important.  Contact a doctor if:  · You have spasms or you leak pee when you have spasms.  Get help right away if:  · You have chills or a fever.  · You have a tube that drains the bladder and:  ? The tube stops draining pee.  ? The tube falls out.  · You have blood in your pee.  Summary  · Acute urinary retention means that you cannot pee at all, or that you pee too little and your bladder is not emptied completely. If it is not treated, it can result in kidney damage or other serious problems.  · If you were sent home with a tube that drains the bladder, take care of it as told by your doctor.  · Monitor any changes in your symptoms. Tell your doctor about any changes.  This information is not intended to replace advice given to you by your health care provider. Make sure  you discuss any questions you have with your health care provider.  Document Released: 07/23/2007 Document Revised: 03/07/2016 Document Reviewed: 03/07/2016  Elsevier Interactive Patient Education © 2019 Elsevier Inc.

## 2018-08-08 LAB — URINE CULTURE

## 2018-08-09 MED ORDER — AMOXICILLIN-POT CLAVULANATE 875-125 MG PO TABS: 1.0000 | ORAL_TABLET | Freq: Two times a day (BID) | ORAL | 0 refills | Status: AC

## 2018-08-09 NOTE — Addendum Note (Signed)
Addended by: Baruch Gouty on: 08/09/2018 08:29 AM   Modules accepted: Orders

## 2018-08-17 DIAGNOSIS — L89899 Pressure ulcer of other site, unspecified stage: Secondary | ICD-10-CM | POA: Diagnosis not present

## 2018-08-17 DIAGNOSIS — L89529 Pressure ulcer of left ankle, unspecified stage: Secondary | ICD-10-CM | POA: Diagnosis not present

## 2018-08-17 DIAGNOSIS — I2510 Atherosclerotic heart disease of native coronary artery without angina pectoris: Secondary | ICD-10-CM | POA: Diagnosis not present

## 2018-08-17 DIAGNOSIS — L89519 Pressure ulcer of right ankle, unspecified stage: Secondary | ICD-10-CM | POA: Diagnosis not present

## 2018-08-19 ENCOUNTER — Other Ambulatory Visit: Payer: Self-pay | Admitting: Family

## 2018-08-19 DIAGNOSIS — I10 Essential (primary) hypertension: Secondary | ICD-10-CM

## 2018-08-23 ENCOUNTER — Other Ambulatory Visit: Payer: Self-pay

## 2018-08-23 DIAGNOSIS — I70299 Other atherosclerosis of native arteries of extremities, unspecified extremity: Secondary | ICD-10-CM

## 2018-08-23 DIAGNOSIS — L97909 Non-pressure chronic ulcer of unspecified part of unspecified lower leg with unspecified severity: Secondary | ICD-10-CM

## 2018-08-24 ENCOUNTER — Other Ambulatory Visit: Payer: Self-pay

## 2018-08-24 ENCOUNTER — Telehealth (HOSPITAL_COMMUNITY): Payer: Self-pay

## 2018-08-24 ENCOUNTER — Ambulatory Visit
Admission: RE | Admit: 2018-08-24 | Discharge: 2018-08-24 | Disposition: A | Discharge status: Home / Self Care | Payer: PPO | Source: Ambulatory Visit | Attending: Urology | Admitting: Urology

## 2018-08-24 DIAGNOSIS — R972 Elevated prostate specific antigen [PSA]: Secondary | ICD-10-CM

## 2018-08-24 DIAGNOSIS — N3289 Other specified disorders of bladder: Secondary | ICD-10-CM | POA: Diagnosis not present

## 2018-08-24 DIAGNOSIS — R590 Localized enlarged lymph nodes: Secondary | ICD-10-CM | POA: Diagnosis not present

## 2018-08-24 MED ORDER — GADOBENATE DIMEGLUMINE 529 MG/ML IV SOLN
14.0000 mL | Freq: Once | INTRAVENOUS | Status: AC | PRN
  Administered 2018-08-24: 14 mL via INTRAVENOUS

## 2018-08-24 NOTE — Telephone Encounter (Signed)
The above patient or their representative was contacted and gave the following answers to these questions:         Do you have any of the following symptoms?  no  Fever                    Cough                   Shortness of breath  Do  you have any of the following other symptoms? no   muscle pain         vomiting,        diarrhea        rash         weakness        red eye        abdominal pain         bruising          bruising or bleeding              joint pain           severe headache    Have you been in contact with someone who was or has been sick in the past 2 weeks?  no  Yes                 Unsure                         Unable to assess   Does the person that you were in contact with have any of the following symptoms?   Cough         shortness of breath           muscle pain         vomiting,            diarrhea            rash            weakness           fever            red eye           abdominal pain           bruising  or  bleeding                joint pain                severe headache               Have you  or someone you have been in contact with traveled internationally in th last month?   no      If yes, which countries?   Have you  or someone you have been in contact with traveled outside [place] in th last month?   no      If yes, which state and city?   COMMENTS OR ACTION PLAN FOR THIS PATIENT:          

## 2018-08-25 ENCOUNTER — Ambulatory Visit (HOSPITAL_COMMUNITY)
Admission: RE | Admit: 2018-08-25 | Discharge: 2018-08-25 | Disposition: A | Discharge status: Home / Self Care | Payer: PPO | Source: Ambulatory Visit | Attending: Vascular Surgery | Admitting: Vascular Surgery

## 2018-08-25 ENCOUNTER — Ambulatory Visit (INDEPENDENT_AMBULATORY_CARE_PROVIDER_SITE_OTHER): Payer: PPO | Admitting: Vascular Surgery

## 2018-08-25 ENCOUNTER — Encounter: Payer: Self-pay | Admitting: Vascular Surgery

## 2018-08-25 ENCOUNTER — Other Ambulatory Visit: Payer: Self-pay

## 2018-08-25 VITALS — BP 115/73 | HR 82 | Temp 97.3°F | Resp 20 | Ht 71.0 in | Wt 155.0 lb

## 2018-08-25 DIAGNOSIS — I70299 Other atherosclerosis of native arteries of extremities, unspecified extremity: Secondary | ICD-10-CM | POA: Diagnosis not present

## 2018-08-25 DIAGNOSIS — L97909 Non-pressure chronic ulcer of unspecified part of unspecified lower leg with unspecified severity: Secondary | ICD-10-CM | POA: Insufficient documentation

## 2018-08-25 NOTE — Progress Notes (Signed)
Patient name: Dustin Medina MRN: [id number] DOB: Dec 02, 1950 Sex: male  REASON FOR VISIT:   Follow-up after angioplasty and stenting of the right common iliac artery.   HPI:   Dustin Medina is a pleasant 68 y.o. male who had presented with a nonhealing wound of his right lateral malleolus.  On 07/16/2018 he had angioplasty and stenting of the right common iliac artery with a 6 mm self-expanding stent and postdilatation with a 5 mm balloon.  He had a moderate stenosis in the right common iliac artery with a 10 mmHg resting pressure gradient.  When I initially saw the patient on 07/15/2018 the wound measured 3 cm in diameter on the lateral malleolus.   Since I saw him last he has no specific complaints.  He denies any significant drainage from the right lateral malleolar wound.  He has been doing the dressing changes.  He states that he quit smoking several months ago.  Current Outpatient Medications  Medication Sig Dispense Refill  . aspirin EC 81 MG EC tablet Take 1 tablet (81 mg total) by mouth daily.    Marland Kitchen atorvastatin (LIPITOR) 10 MG tablet Take 1 tablet (10 mg total) by mouth daily at 6 PM. 30 tablet 3  . clopidogrel (PLAVIX) 75 MG tablet Take 1 tablet (75 mg total) by mouth daily with breakfast. 30 tablet 6  . cyclobenzaprine (FLEXERIL) 10 MG tablet TAKE 1 TABLET (10 MG TOTAL) BY MOUTH 3 (THREE) TIMES DAILY AS NEEDED FOR MUSCLE SPASMS. (Patient taking differently: Take 10 mg by mouth 3 (three) times daily as needed for muscle spasms. ) 30 tablet 0  . ibuprofen (ADVIL) 800 MG tablet Take 800 mg by mouth every 8 (eight) hours as needed for moderate pain.     . meloxicam (MOBIC) 15 MG tablet TAKE 1 TABLET (15 MG TOTAL) BY MOUTH DAILY AS NEEDED. (Patient taking differently: Take 15 mg by mouth daily. ) 90 tablet 0  . Multiple Vitamin (MULITIVITAMIN WITH MINERALS) TABS Take 1 tablet by mouth daily.     . Nerve Stimulator (PRO COMFORT TENS ELECTRODES) MISC Apply 2 patches topically daily as  needed. 60 each 11  . Nerve Stimulator (PRO COMFORT TENS UNIT) DEVI Apply 1 Units topically daily as needed. 1 Device 0  . niacin (NIASPAN) 1000 MG CR tablet TAKE 1 TABLET (1,000 MG TOTAL) BY MOUTH AT BEDTIME. (Patient taking differently: Take 1,000 mg by mouth at bedtime. ) 90 tablet 0  . nitrofurantoin, macrocrystal-monohydrate, (MACROBID) 100 MG capsule Take 1 capsule (100 mg total) by mouth 2 (two) times daily. 1 po BId 14 capsule 0  . promethazine (PHENERGAN) 25 MG tablet Take 1 tablet (25 mg total) by mouth every 8 (eight) hours as needed for nausea or vomiting. 20 tablet 1  . testosterone cypionate (DEPOTESTOSTERONE CYPIONATE) 200 MG/ML injection Inject 0.75 mLs (150 mg total) into the muscle every 14 (fourteen) days. 10 mL 5  . trimethoprim (TRIMPEX) 100 MG tablet TAKE 1 TABLET BY MOUTH EVERY DAY (Patient taking differently: Take 100 mg by mouth daily. ) 30 tablet 2  . valsartan-hydrochlorothiazide (DIOVAN-HCT) 320-25 MG tablet TAKE 1 TABLET BY MOUTH EVERY DAY 90 tablet 0   No current facility-administered medications for this visit.     REVIEW OF SYSTEMS:  [X]  denotes positive finding, [ ]  denotes negative finding Vascular    Leg swelling    Cardiac    Chest pain or chest pressure:    Shortness of breath upon exertion:  Short of breath when lying flat:    Irregular heart rhythm:    Constitutional    Fever or chills:     PHYSICAL EXAM:   Vitals:   08/25/18 1501  BP: 115/73  Pulse: 82  Resp: 20  Temp: (!) 97.3 F (36.3 C)  SpO2: 96%  Weight: 155 lb (70.3 kg)  Height: 5\' 11"  (1.803 m)    GENERAL: The patient is a well-nourished male, in no acute distress. The vital signs are documented above. CARDIOVASCULAR: There is a regular rate and rhythm. PULMONARY: There is good air exchange bilaterally without wheezing or rales. VASCULAR: The patient has brisk Doppler signals in the anterior tibial and posterior tibial positions on the right although monophasic.  ABI is up to  76%.  However toe pressures 34 mmHg. The wound now measures 2 cm in maximum diameter which is an improvement since May.     DATA:   ARTERIAL DOPPLER STUDY: I have independently interpreted his arterial Doppler study today.  On the right side, which is the side of concern, there is a brisk but monophasic anterior tibial and posterior tibial signal with the Doppler.  ABI is 76% which has improved from 64% before.  Toe pressure is 34 mmHg.  On the left side there is a monophasic posterior tibial signal with a biphasic dorsalis pedis signal.  ABI 71%.  Toe pressures 51 mmHg.  MEDICAL ISSUES:   PERIPHERAL VASCULAR DISEASE WITH A RIGHT LATERAL MALLEOLAR WOUND: His wound does appear to be shrinking in size.  We have addressed his inflow as described above.  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  However given the wound is improving we will hold off on that.  I will plan on seeing him back in 6 weeks.  When he comes back in 6 weeks we will map his right great saphenous vein as his backup plan would be a femoropopliteal bypass on the right.  However if the wound continues to improve we will hold off on further revascularization.  [name] [facility] [phone]

## 2018-08-31 DIAGNOSIS — N312 Flaccid neuropathic bladder, not elsewhere classified: Secondary | ICD-10-CM | POA: Diagnosis not present

## 2018-08-31 DIAGNOSIS — R972 Elevated prostate specific antigen [PSA]: Secondary | ICD-10-CM | POA: Diagnosis not present

## 2018-08-31 DIAGNOSIS — N5201 Erectile dysfunction due to arterial insufficiency: Secondary | ICD-10-CM | POA: Diagnosis not present

## 2018-09-07 ENCOUNTER — Other Ambulatory Visit: Payer: Self-pay | Admitting: Nurse Practitioner

## 2018-09-14 DIAGNOSIS — L89529 Pressure ulcer of left ankle, unspecified stage: Secondary | ICD-10-CM | POA: Diagnosis not present

## 2018-09-14 DIAGNOSIS — I739 Peripheral vascular disease, unspecified: Secondary | ICD-10-CM | POA: Diagnosis not present

## 2018-09-14 DIAGNOSIS — L89302 Pressure ulcer of unspecified buttock, stage 2: Secondary | ICD-10-CM | POA: Diagnosis not present

## 2018-09-14 DIAGNOSIS — G8220 Paraplegia, unspecified: Secondary | ICD-10-CM | POA: Diagnosis not present

## 2018-09-14 DIAGNOSIS — L89899 Pressure ulcer of other site, unspecified stage: Secondary | ICD-10-CM | POA: Diagnosis not present

## 2018-09-14 DIAGNOSIS — L89519 Pressure ulcer of right ankle, unspecified stage: Secondary | ICD-10-CM | POA: Diagnosis not present

## 2018-09-16 ENCOUNTER — Encounter (HOSPITAL_COMMUNITY): Payer: Self-pay | Admitting: Physical Therapy

## 2018-09-16 NOTE — Therapy (Signed)
[place], [place], [address] Phone: [phone]   Fax:  [phone]  Patient Details  Name: Dustin Medina MRN: [id number] Date of Birth: 1950-03-31 Referring Provider:  No ref. provider found  Encounter Date: 09/16/2018    PHYSICAL THERAPY DISCHARGE SUMMARY  Visits from Start of Care: 50  Current functional level related to goals / functional outcomes: Wound has not decreased in size; pt is being referred to wound center.   Remaining deficits: Wound on lateral malleoli.   Education / Equipment: Importance of cleansing and dressing change to decrease the risk of infection.  Plan: Patient agrees to discharge.  Patient goals were not met. Patient is being discharged due to lack of progress.  ?????     Rayetta Humphrey, PT CLT [phone] 09/16/2018, 9:36 AM  [facility] [address] [place], [place], [address] Phone: [phone]   Fax:  [phone]

## 2018-09-23 DIAGNOSIS — L89512 Pressure ulcer of right ankle, stage 2: Secondary | ICD-10-CM | POA: Diagnosis not present

## 2018-09-28 ENCOUNTER — Other Ambulatory Visit: Payer: Self-pay

## 2018-09-28 DIAGNOSIS — N390 Urinary tract infection, site not specified: Secondary | ICD-10-CM | POA: Diagnosis not present

## 2018-09-28 DIAGNOSIS — R339 Retention of urine, unspecified: Secondary | ICD-10-CM | POA: Diagnosis not present

## 2018-09-28 DIAGNOSIS — I70299 Other atherosclerosis of native arteries of extremities, unspecified extremity: Secondary | ICD-10-CM

## 2018-09-28 DIAGNOSIS — N319 Neuromuscular dysfunction of bladder, unspecified: Secondary | ICD-10-CM | POA: Diagnosis not present

## 2018-09-28 DIAGNOSIS — L97909 Non-pressure chronic ulcer of unspecified part of unspecified lower leg with unspecified severity: Secondary | ICD-10-CM

## 2018-10-05 DIAGNOSIS — L89302 Pressure ulcer of unspecified buttock, stage 2: Secondary | ICD-10-CM | POA: Diagnosis not present

## 2018-10-05 DIAGNOSIS — L89519 Pressure ulcer of right ankle, unspecified stage: Secondary | ICD-10-CM | POA: Diagnosis not present

## 2018-10-05 DIAGNOSIS — L89159 Pressure ulcer of sacral region, unspecified stage: Secondary | ICD-10-CM | POA: Diagnosis not present

## 2018-10-05 DIAGNOSIS — L89899 Pressure ulcer of other site, unspecified stage: Secondary | ICD-10-CM | POA: Diagnosis not present

## 2018-10-05 DIAGNOSIS — L89529 Pressure ulcer of left ankle, unspecified stage: Secondary | ICD-10-CM | POA: Diagnosis not present

## 2018-10-05 DIAGNOSIS — L89152 Pressure ulcer of sacral region, stage 2: Secondary | ICD-10-CM | POA: Diagnosis not present

## 2018-10-05 DIAGNOSIS — L89512 Pressure ulcer of right ankle, stage 2: Secondary | ICD-10-CM | POA: Diagnosis not present

## 2018-10-06 ENCOUNTER — Ambulatory Visit: Payer: PPO | Admitting: Vascular Surgery

## 2018-10-06 ENCOUNTER — Ambulatory Visit (HOSPITAL_COMMUNITY): Payer: PPO | Attending: Vascular Surgery

## 2018-10-07 ENCOUNTER — Encounter: Payer: Self-pay | Admitting: Family

## 2018-10-26 DIAGNOSIS — L89519 Pressure ulcer of right ankle, unspecified stage: Secondary | ICD-10-CM | POA: Diagnosis not present

## 2018-10-26 DIAGNOSIS — L89899 Pressure ulcer of other site, unspecified stage: Secondary | ICD-10-CM | POA: Diagnosis not present

## 2018-10-26 DIAGNOSIS — I7090 Unspecified atherosclerosis: Secondary | ICD-10-CM | POA: Diagnosis not present

## 2018-10-26 DIAGNOSIS — I739 Peripheral vascular disease, unspecified: Secondary | ICD-10-CM | POA: Diagnosis not present

## 2018-11-05 DIAGNOSIS — N390 Urinary tract infection, site not specified: Secondary | ICD-10-CM | POA: Diagnosis not present

## 2018-11-05 DIAGNOSIS — N319 Neuromuscular dysfunction of bladder, unspecified: Secondary | ICD-10-CM | POA: Diagnosis not present

## 2018-11-07 ENCOUNTER — Encounter (HOSPITAL_COMMUNITY): Payer: Self-pay | Admitting: Emergency Medicine

## 2018-11-07 ENCOUNTER — Other Ambulatory Visit: Payer: Self-pay

## 2018-11-07 DIAGNOSIS — X58XXXA Exposure to other specified factors, initial encounter: Secondary | ICD-10-CM | POA: Diagnosis not present

## 2018-11-07 DIAGNOSIS — F17210 Nicotine dependence, cigarettes, uncomplicated: Secondary | ICD-10-CM | POA: Diagnosis not present

## 2018-11-07 DIAGNOSIS — Y9389 Activity, other specified: Secondary | ICD-10-CM | POA: Insufficient documentation

## 2018-11-07 DIAGNOSIS — S91301A Unspecified open wound, right foot, initial encounter: Secondary | ICD-10-CM | POA: Diagnosis not present

## 2018-11-07 DIAGNOSIS — Z7982 Long term (current) use of aspirin: Secondary | ICD-10-CM | POA: Insufficient documentation

## 2018-11-07 DIAGNOSIS — L7622 Postprocedural hemorrhage and hematoma of skin and subcutaneous tissue following other procedure: Secondary | ICD-10-CM | POA: Diagnosis not present

## 2018-11-07 DIAGNOSIS — Y999 Unspecified external cause status: Secondary | ICD-10-CM | POA: Insufficient documentation

## 2018-11-07 DIAGNOSIS — Z79899 Other long term (current) drug therapy: Secondary | ICD-10-CM | POA: Insufficient documentation

## 2018-11-07 DIAGNOSIS — R000 Tachycardia, unspecified: Secondary | ICD-10-CM | POA: Diagnosis not present

## 2018-11-07 DIAGNOSIS — Y92009 Unspecified place in unspecified non-institutional (private) residence as the place of occurrence of the external cause: Secondary | ICD-10-CM | POA: Insufficient documentation

## 2018-11-07 DIAGNOSIS — R58 Hemorrhage, not elsewhere classified: Secondary | ICD-10-CM | POA: Diagnosis not present

## 2018-11-07 DIAGNOSIS — T8130XA Disruption of wound, unspecified, initial encounter: Secondary | ICD-10-CM | POA: Diagnosis not present

## 2018-11-07 NOTE — ED Triage Notes (Signed)
Pt reports has was drying foot in shower and foot began to bleed. Pt was unable to get bleeding to stop so called ems. Pt is paraplegic. Pt has previous wounds on same foot being treated by wound clinic.bleeding controlled with bandage from ems.

## 2018-11-08 ENCOUNTER — Other Ambulatory Visit: Payer: Self-pay

## 2018-11-08 ENCOUNTER — Emergency Department (HOSPITAL_COMMUNITY)
Admission: EM | Admit: 2018-11-08 | Discharge: 2018-11-08 | Disposition: A | Discharge status: Home / Self Care | Payer: PPO | Attending: Emergency Medicine | Admitting: Emergency Medicine

## 2018-11-08 DIAGNOSIS — T148XXA Other injury of unspecified body region, initial encounter: Secondary | ICD-10-CM

## 2018-11-08 LAB — CBC
HCT: 36.7 % — ABNORMAL LOW (ref 39.0–52.0)
Hemoglobin: 11 g/dL — ABNORMAL LOW (ref 13.0–17.0)
MCH: 24.1 pg — ABNORMAL LOW (ref 26.0–34.0)
MCHC: 30 g/dL (ref 30.0–36.0)
MCV: 80.3 fL (ref 80.0–100.0)
Platelets: 577 10*3/uL — ABNORMAL HIGH (ref 150–400)
RBC: 4.57 MIL/uL (ref 4.22–5.81)
RDW: 16.4 % — ABNORMAL HIGH (ref 11.5–15.5)
WBC: 14.7 10*3/uL — ABNORMAL HIGH (ref 4.0–10.5)
nRBC: 0 % (ref 0.0–0.2)
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
## 2018-11-08 NOTE — ED Notes (Addendum)
Pt still attempting to get in touch with brother to give him a ride home. Secretary working with community resources to see if we can get him a ride home.

## 2018-11-08 NOTE — Discharge Instructions (Signed)
Please return if you have any worsening bleeding at home that will not stop.  Be sure to place the silver nitrate patches on your chronic wounds as we discussed.  Please follow-up with wound center

## 2018-11-08 NOTE — ED Notes (Signed)
Pt given coffee per request- pt charging cell phone and trying to find ride home

## 2018-11-08 NOTE — ED Notes (Signed)
Called EMS sceduler back to cancel transport.

## 2018-11-08 NOTE — ED Notes (Signed)
Called [facility] for transport home.  Spoke with Wells Guiles [facility]).

## 2018-11-08 NOTE — ED Notes (Signed)
Pt bleeding has stopped. Pt sitting in wheelchair with foot elevated. NAD noted.

## 2018-11-08 NOTE — ED Notes (Signed)
Pt given crackers,peanut butter and sprite to drink

## 2018-11-08 NOTE — ED Notes (Addendum)
Pt is paraplegic and attempting to get in touch with someone to give him a ride home. Charge RN aware.

## 2018-11-09 ENCOUNTER — Other Ambulatory Visit: Payer: Self-pay

## 2018-11-09 DIAGNOSIS — I70299 Other atherosclerosis of native arteries of extremities, unspecified extremity: Secondary | ICD-10-CM

## 2018-11-09 DIAGNOSIS — L97909 Non-pressure chronic ulcer of unspecified part of unspecified lower leg with unspecified severity: Secondary | ICD-10-CM

## 2018-11-10 ENCOUNTER — Ambulatory Visit (INDEPENDENT_AMBULATORY_CARE_PROVIDER_SITE_OTHER): Payer: PPO | Admitting: Vascular Surgery

## 2018-11-10 ENCOUNTER — Other Ambulatory Visit: Payer: Self-pay

## 2018-11-10 ENCOUNTER — Ambulatory Visit (HOSPITAL_COMMUNITY)
Admission: RE | Admit: 2018-11-10 | Discharge: 2018-11-10 | Disposition: A | Discharge status: Home / Self Care | Payer: PPO | Source: Ambulatory Visit | Attending: Vascular Surgery | Admitting: Vascular Surgery

## 2018-11-10 ENCOUNTER — Encounter: Payer: Self-pay | Admitting: Vascular Surgery

## 2018-11-10 VITALS — BP 129/73 | HR 77 | Temp 97.8°F | Resp 18 | Ht 71.5 in | Wt 155.0 lb

## 2018-11-10 DIAGNOSIS — I70299 Other atherosclerosis of native arteries of extremities, unspecified extremity: Secondary | ICD-10-CM

## 2018-11-10 DIAGNOSIS — L97909 Non-pressure chronic ulcer of unspecified part of unspecified lower leg with unspecified severity: Secondary | ICD-10-CM | POA: Diagnosis not present

## 2018-11-10 DIAGNOSIS — L89510 Pressure ulcer of right ankle, unstageable: Secondary | ICD-10-CM

## 2018-11-10 NOTE — Progress Notes (Signed)
Patient name: Dustin Medina MRN: [id number] DOB: January 08, 1951 Sex: male  REASON FOR VISIT:   Follow-up of nonhealing wound right lateral malleolus.  HPI:   Dustin Medina is a pleasant 68 y.o. male who would presented with a nonhealing wound on his right lateral malleolus.  On 07/16/2018 he underwent angioplasty and stenting of the right common iliac artery.  When I initially saw the patient on 07/15/2018 the wound measured 3 cm in diameter.  I last saw him in follow-up on 08/25/2018.  At the time of his last follow-up visit, on the right side there was a brisk but monophasic anterior tibial and posterior tibial signal.  His ABI was 76% which was improved.  Toe pressure was 34 mmHg.  I felt that if the wound was not improving he might be a candidate for endovascular approach for his superficial femoral artery occlusion on the right.  Comes in today for a follow-up visit.  Since I saw him last he is now developed additional wounds on the right foot as documented below.  He has an extensive wound between the third and fourth toes and also a wound on the dorsum of the foot.  The right lateral malleolar wound is slightly smaller but still is concerning given the proximity to the lateral malleolus.  Current Outpatient Medications  Medication Sig Dispense Refill  . aspirin EC 81 MG EC tablet Take 1 tablet (81 mg total) by mouth daily.    Marland Kitchen atorvastatin (LIPITOR) 10 MG tablet Take 1 tablet (10 mg total) by mouth daily at 6 PM. 30 tablet 3  . clopidogrel (PLAVIX) 75 MG tablet Take 1 tablet (75 mg total) by mouth daily with breakfast. 30 tablet 6  . cyclobenzaprine (FLEXERIL) 10 MG tablet TAKE 1 TABLET (10 MG TOTAL) BY MOUTH 3 (THREE) TIMES DAILY AS NEEDED FOR MUSCLE SPASMS. (Patient taking differently: Take 10 mg by mouth 3 (three) times daily as needed for muscle spasms. ) 30 tablet 0  . ibuprofen (ADVIL) 800 MG tablet Take 800 mg by mouth every 8 (eight) hours as needed for moderate pain.     . meloxicam  (MOBIC) 15 MG tablet TAKE 1 TABLET (15 MG TOTAL) BY MOUTH DAILY AS NEEDED. 90 tablet 0  . Multiple Vitamin (MULITIVITAMIN WITH MINERALS) TABS Take 1 tablet by mouth daily.     . Nerve Stimulator (PRO COMFORT TENS ELECTRODES) MISC Apply 2 patches topically daily as needed. 60 each 11  . Nerve Stimulator (PRO COMFORT TENS UNIT) DEVI Apply 1 Units topically daily as needed. 1 Device 0  . niacin (NIASPAN) 1000 MG CR tablet TAKE 1 TABLET (1,000 MG TOTAL) BY MOUTH AT BEDTIME. 90 tablet 0  . nitrofurantoin, macrocrystal-monohydrate, (MACROBID) 100 MG capsule Take 1 capsule (100 mg total) by mouth 2 (two) times daily. 1 po BId 14 capsule 0  . promethazine (PHENERGAN) 25 MG tablet Take 1 tablet (25 mg total) by mouth every 8 (eight) hours as needed for nausea or vomiting. 20 tablet 1  . silver sulfADIAZINE (SILVADENE) 1 % cream APPLY TO AFFECTED AREA EVERY DAY FOR 30 DAYS    . testosterone cypionate (DEPOTESTOSTERONE CYPIONATE) 200 MG/ML injection Inject 0.75 mLs (150 mg total) into the muscle every 14 (fourteen) days. 10 mL 5  . trimethoprim (TRIMPEX) 100 MG tablet TAKE 1 TABLET BY MOUTH EVERY DAY (Patient taking differently: Take 100 mg by mouth daily. ) 30 tablet 2  . valsartan-hydrochlorothiazide (DIOVAN-HCT) 320-25 MG tablet TAKE 1 TABLET BY MOUTH EVERY  DAY 90 tablet 0   No current facility-administered medications for this visit.     REVIEW OF SYSTEMS:  [X]  denotes positive finding, [ ]  denotes negative finding Vascular    Leg swelling    Cardiac    Chest pain or chest pressure:    Shortness of breath upon exertion:    Short of breath when lying flat:    Irregular heart rhythm:    Constitutional    Fever or chills:     PHYSICAL EXAM:   Vitals:   11/10/18 0854  BP: 129/73  Pulse: 77  Resp: 18  Temp: 97.8 F (36.6 C)  TempSrc: Temporal  SpO2: 99%  Weight: 155 lb (70.3 kg)  Height: 5' 11.5" (1.816 m)    GENERAL: The patient is a well-nourished male, in no acute distress. The vital  signs are documented above. CARDIOVASCULAR: There is a regular rate and rhythm. PULMONARY: There is good air exchange bilaterally without wheezing or rales. Is difficult to assess his femoral pulses with him in the wheelchair. He has extensive wounds on the right foot as documented below.       DATA:   VEIN MAP: I have independently interpreted his vein map today.  On the right side the saphenous vein looks reasonable in size.  On the left side the great saphenous vein looks reasonable in size to the proximal calf.  ARTERIOGRAM:   MEDICAL ISSUES:   CRITICAL LIMB ISCHEMIA: This patient has had successful PTA and stenting of the right common iliac artery.  Given the progression of the wounds on the right foot I have recommended that we proceed with arteriography and attempt intervention on the disease in the right superficial femoral artery.  I have discussed the indications for the procedure and the potential complications and he is agreeable to proceed.  This has been scheduled for 10-20.  I have explained again that given the extent of the wounds even with successful revascularization he is at risk for limb loss.  He is paraplegic with does like to stand.  However I am concerned about these wounds progressing.  A total of 25 minutes was spent on this visit. 12 minutes was face to face time. More than 50% of the time was spent on counseling and coordinating with the patient.    [name] [facility] [phone]

## 2018-11-10 NOTE — H&P (View-Only) (Signed)
Patient name: Dustin Medina MRN: [id number] DOB: [birth year] Sex: male  REASON FOR VISIT:   Follow-up of nonhealing wound right lateral malleolus.  HPI:   Dustin Medina is a pleasant 68 y.o. male who would presented with a nonhealing wound on his right lateral malleolus.  On 07/16/2018 he underwent angioplasty and stenting of the right common iliac artery.  When I initially saw the patient on 07/15/2018 the wound measured 3 cm in diameter.  I last saw him in follow-up on 08/25/2018.  At the time of his last follow-up visit, on the right side there was a brisk but monophasic anterior tibial and posterior tibial signal.  His ABI was 76% which was improved.  Toe pressure was 34 mmHg.  I felt that if the wound was not improving he might be a candidate for endovascular approach for his superficial femoral artery occlusion on the right.  Comes in today for a follow-up visit.  Since I saw him last he is now developed additional wounds on the right foot as documented below.  He has an extensive wound between the third and fourth toes and also a wound on the dorsum of the foot.  The right lateral malleolar wound is slightly smaller but still is concerning given the proximity to the lateral malleolus.  Current Outpatient Medications  Medication Sig Dispense Refill  . aspirin EC 81 MG EC tablet Take 1 tablet (81 mg total) by mouth daily.    Marland Kitchen atorvastatin (LIPITOR) 10 MG tablet Take 1 tablet (10 mg total) by mouth daily at 6 PM. 30 tablet 3  . clopidogrel (PLAVIX) 75 MG tablet Take 1 tablet (75 mg total) by mouth daily with breakfast. 30 tablet 6  . cyclobenzaprine (FLEXERIL) 10 MG tablet TAKE 1 TABLET (10 MG TOTAL) BY MOUTH 3 (THREE) TIMES DAILY AS NEEDED FOR MUSCLE SPASMS. (Patient taking differently: Take 10 mg by mouth 3 (three) times daily as needed for muscle spasms. ) 30 tablet 0  . ibuprofen (ADVIL) 800 MG tablet Take 800 mg by mouth every 8 (eight) hours as needed for moderate pain.     . meloxicam  (MOBIC) 15 MG tablet TAKE 1 TABLET (15 MG TOTAL) BY MOUTH DAILY AS NEEDED. 90 tablet 0  . Multiple Vitamin (MULITIVITAMIN WITH MINERALS) TABS Take 1 tablet by mouth daily.     . Nerve Stimulator (PRO COMFORT TENS ELECTRODES) MISC Apply 2 patches topically daily as needed. 60 each 11  . Nerve Stimulator (PRO COMFORT TENS UNIT) DEVI Apply 1 Units topically daily as needed. 1 Device 0  . niacin (NIASPAN) 1000 MG CR tablet TAKE 1 TABLET (1,000 MG TOTAL) BY MOUTH AT BEDTIME. 90 tablet 0  . nitrofurantoin, macrocrystal-monohydrate, (MACROBID) 100 MG capsule Take 1 capsule (100 mg total) by mouth 2 (two) times daily. 1 po BId 14 capsule 0  . promethazine (PHENERGAN) 25 MG tablet Take 1 tablet (25 mg total) by mouth every 8 (eight) hours as needed for nausea or vomiting. 20 tablet 1  . silver sulfADIAZINE (SILVADENE) 1 % cream APPLY TO AFFECTED AREA EVERY DAY FOR 30 DAYS    . testosterone cypionate (DEPOTESTOSTERONE CYPIONATE) 200 MG/ML injection Inject 0.75 mLs (150 mg total) into the muscle every 14 (fourteen) days. 10 mL 5  . trimethoprim (TRIMPEX) 100 MG tablet TAKE 1 TABLET BY MOUTH EVERY DAY (Patient taking differently: Take 100 mg by mouth daily. ) 30 tablet 2  . valsartan-hydrochlorothiazide (DIOVAN-HCT) 320-25 MG tablet TAKE 1 TABLET BY MOUTH EVERY  DAY 90 tablet 0   No current facility-administered medications for this visit.     REVIEW OF SYSTEMS:  [X]  denotes positive finding, [ ]  denotes negative finding Vascular    Leg swelling    Cardiac    Chest pain or chest pressure:    Shortness of breath upon exertion:    Short of breath when lying flat:    Irregular heart rhythm:    Constitutional    Fever or chills:     PHYSICAL EXAM:   Vitals:   11/10/18 0854  BP: 129/73  Pulse: 77  Resp: 18  Temp: 97.8 F (36.6 C)  TempSrc: Temporal  SpO2: 99%  Weight: 155 lb (70.3 kg)  Height: 5' 11.5" (1.816 m)    GENERAL: The patient is a well-nourished male, in no acute distress. The vital  signs are documented above. CARDIOVASCULAR: There is a regular rate and rhythm. PULMONARY: There is good air exchange bilaterally without wheezing or rales. Is difficult to assess his femoral pulses with him in the wheelchair. He has extensive wounds on the right foot as documented below.       DATA:   VEIN MAP: I have independently interpreted his vein map today.  On the right side the saphenous vein looks reasonable in size.  On the left side the great saphenous vein looks reasonable in size to the proximal calf.  ARTERIOGRAM:   MEDICAL ISSUES:   CRITICAL LIMB ISCHEMIA: This patient has had successful PTA and stenting of the right common iliac artery.  Given the progression of the wounds on the right foot I have recommended that we proceed with arteriography and attempt intervention on the disease in the right superficial femoral artery.  I have discussed the indications for the procedure and the potential complications and he is agreeable to proceed.  This has been scheduled for 10-20.  I have explained again that given the extent of the wounds even with successful revascularization he is at risk for limb loss.  He is paraplegic with does like to stand.  However I am concerned about these wounds progressing.  A total of 25 minutes was spent on this visit. 12 minutes was face to face time. More than 50% of the time was spent on counseling and coordinating with the patient.    [name] [facility] [phone]

## 2018-11-18 ENCOUNTER — Other Ambulatory Visit (HOSPITAL_COMMUNITY)
Admission: RE | Admit: 2018-11-18 | Discharge: 2018-11-18 | Disposition: A | Discharge status: Home / Self Care | Payer: PPO | Source: Ambulatory Visit | Attending: Vascular Surgery | Admitting: Vascular Surgery

## 2018-11-18 DIAGNOSIS — Z20828 Contact with and (suspected) exposure to other viral communicable diseases: Secondary | ICD-10-CM | POA: Insufficient documentation

## 2018-11-18 DIAGNOSIS — Z01812 Encounter for preprocedural laboratory examination: Secondary | ICD-10-CM | POA: Insufficient documentation

## 2018-11-18 LAB — SARS CORONAVIRUS 2 (TAT 6-24 HRS): SARS Coronavirus 2: NEGATIVE

## 2018-11-19 ENCOUNTER — Encounter (HOSPITAL_COMMUNITY)
Admission: RE | Disposition: A | Discharge status: Home / Self Care | Payer: Self-pay | Source: Home / Self Care | Attending: Vascular Surgery

## 2018-11-19 ENCOUNTER — Other Ambulatory Visit: Payer: Self-pay

## 2018-11-19 ENCOUNTER — Ambulatory Visit (HOSPITAL_COMMUNITY)
Admission: RE | Admit: 2018-11-19 | Discharge: 2018-11-20 | Disposition: A | Discharge status: Home / Self Care | Payer: PPO | Attending: Vascular Surgery | Admitting: Vascular Surgery

## 2018-11-19 DIAGNOSIS — Z7982 Long term (current) use of aspirin: Secondary | ICD-10-CM | POA: Diagnosis not present

## 2018-11-19 DIAGNOSIS — Z7902 Long term (current) use of antithrombotics/antiplatelets: Secondary | ICD-10-CM | POA: Insufficient documentation

## 2018-11-19 DIAGNOSIS — I70235 Atherosclerosis of native arteries of right leg with ulceration of other part of foot: Secondary | ICD-10-CM | POA: Diagnosis not present

## 2018-11-19 DIAGNOSIS — Z79899 Other long term (current) drug therapy: Secondary | ICD-10-CM | POA: Insufficient documentation

## 2018-11-19 DIAGNOSIS — L97319 Non-pressure chronic ulcer of right ankle with unspecified severity: Secondary | ICD-10-CM | POA: Diagnosis not present

## 2018-11-19 DIAGNOSIS — I70233 Atherosclerosis of native arteries of right leg with ulceration of ankle: Secondary | ICD-10-CM | POA: Diagnosis not present

## 2018-11-19 DIAGNOSIS — I739 Peripheral vascular disease, unspecified: Secondary | ICD-10-CM | POA: Diagnosis present

## 2018-11-19 DIAGNOSIS — L97519 Non-pressure chronic ulcer of other part of right foot with unspecified severity: Secondary | ICD-10-CM | POA: Diagnosis not present

## 2018-11-19 DIAGNOSIS — I771 Stricture of artery: Secondary | ICD-10-CM | POA: Diagnosis not present

## 2018-11-19 HISTORY — PX: ABDOMINAL AORTOGRAM W/LOWER EXTREMITY: CATH118223

## 2018-11-19 LAB — POCT I-STAT, CHEM 8
BUN: 11 mg/dL (ref 8–23)
Calcium, Ion: 1.15 mmol/L (ref 1.15–1.40)
Chloride: 102 mmol/L (ref 98–111)
Creatinine, Ser: 0.5 mg/dL — ABNORMAL LOW (ref 0.61–1.24)
Glucose, Bld: 113 mg/dL — ABNORMAL HIGH (ref 70–99)
HCT: 33 % — ABNORMAL LOW (ref 39.0–52.0)
Hemoglobin: 11.2 g/dL — ABNORMAL LOW (ref 13.0–17.0)
Potassium: 3.2 mmol/L — ABNORMAL LOW (ref 3.5–5.1)
Sodium: 139 mmol/L (ref 135–145)
TCO2: 25 mmol/L (ref 22–32)

## 2018-11-19 LAB — POCT ACTIVATED CLOTTING TIME
Activated Clotting Time: 219 seconds
Activated Clotting Time: 219 seconds
Activated Clotting Time: 235 seconds
Activated Clotting Time: 191 seconds

## 2018-11-19 SURGERY — ABDOMINAL AORTOGRAM W/LOWER EXTREMITY
Anesthesia: LOCAL | Laterality: Bilateral

## 2018-11-19 MED ORDER — SODIUM CHLORIDE 0.9 % IV SOLN
INTRAVENOUS | Status: DC
  Administered 2018-11-19: 10:00:00 via INTRAVENOUS

## 2018-11-19 MED ORDER — LIDOCAINE HCL (PF) 1 % IJ SOLN
INTRAMUSCULAR | Status: DC | PRN
  Administered 2018-11-19: 20 mL

## 2018-11-19 MED ORDER — SODIUM CHLORIDE 0.9 % IV SOLN: 250.0000 mL | INTRAVENOUS | Status: DC | PRN

## 2018-11-19 MED ORDER — MELOXICAM 7.5 MG PO TABS
15.0000 mg | ORAL_TABLET | Freq: Every day | ORAL | Status: DC | PRN
  Filled 2018-11-19: qty 2

## 2018-11-19 MED ORDER — PROMETHAZINE HCL 25 MG PO TABS: 25.0000 mg | ORAL_TABLET | Freq: Three times a day (TID) | ORAL | Status: DC | PRN

## 2018-11-19 MED ORDER — HYDROCHLOROTHIAZIDE 25 MG PO TABS
25.0000 mg | ORAL_TABLET | Freq: Every day | ORAL | Status: DC
  Administered 2018-11-20: 25 mg via ORAL
  Filled 2018-11-19: qty 1

## 2018-11-19 MED ORDER — HEPARIN (PORCINE) IN NACL 1000-0.9 UT/500ML-% IV SOLN
INTRAVENOUS | Status: DC | PRN
  Administered 2018-11-19 (×2): 500 mL

## 2018-11-19 MED ORDER — IBUPROFEN 200 MG PO TABS: 200.0000 mg | ORAL_TABLET | Freq: Four times a day (QID) | ORAL | Status: DC | PRN

## 2018-11-19 MED ORDER — HYDRALAZINE HCL 20 MG/ML IJ SOLN: 5.0000 mg | INTRAMUSCULAR | Status: DC | PRN

## 2018-11-19 MED ORDER — PRO COMFORT TENS UNIT DEVI: 1.0000 [IU] | Freq: Every day | Status: DC | PRN

## 2018-11-19 MED ORDER — ACETAMINOPHEN 325 MG PO TABS: 650.0000 mg | ORAL_TABLET | ORAL | Status: DC | PRN

## 2018-11-19 MED ORDER — TRIMETHOPRIM 100 MG PO TABS
100.0000 mg | ORAL_TABLET | ORAL | Status: DC
  Filled 2018-11-19: qty 1

## 2018-11-19 MED ORDER — CLOPIDOGREL BISULFATE 75 MG PO TABS
75.0000 mg | ORAL_TABLET | Freq: Every day | ORAL | Status: DC
  Administered 2018-11-20: 75 mg via ORAL
  Filled 2018-11-19: qty 1

## 2018-11-19 MED ORDER — CLOPIDOGREL BISULFATE 75 MG PO TABS: 75.0000 mg | ORAL_TABLET | Freq: Every day | ORAL | Status: DC

## 2018-11-19 MED ORDER — SODIUM CHLORIDE 0.9 % WEIGHT BASED INFUSION
1.0000 mL/kg/h | INTRAVENOUS | Status: AC
  Administered 2018-11-19: 1 mL/kg/h via INTRAVENOUS

## 2018-11-19 MED ORDER — LABETALOL HCL 5 MG/ML IV SOLN: 10.0000 mg | INTRAVENOUS | Status: DC | PRN

## 2018-11-19 MED ORDER — VALSARTAN-HYDROCHLOROTHIAZIDE 320-25 MG PO TABS: 1.0000 | ORAL_TABLET | Freq: Every day | ORAL | Status: DC

## 2018-11-19 MED ORDER — HEPARIN SODIUM (PORCINE) 1000 UNIT/ML IJ SOLN
INTRAMUSCULAR | Status: AC
  Filled 2018-11-19: qty 1

## 2018-11-19 MED ORDER — LIDOCAINE HCL (PF) 1 % IJ SOLN
INTRAMUSCULAR | Status: AC
  Filled 2018-11-19: qty 30

## 2018-11-19 MED ORDER — HEPARIN (PORCINE) IN NACL 1000-0.9 UT/500ML-% IV SOLN
INTRAVENOUS | Status: AC
  Filled 2018-11-19: qty 1000

## 2018-11-19 MED ORDER — NIACIN ER (ANTIHYPERLIPIDEMIC) 500 MG PO TBCR
1000.0000 mg | EXTENDED_RELEASE_TABLET | Freq: Every day | ORAL | Status: DC
  Administered 2018-11-19: 1000 mg via ORAL
  Filled 2018-11-19 (×2): qty 2

## 2018-11-19 MED ORDER — IRBESARTAN 300 MG PO TABS
300.0000 mg | ORAL_TABLET | Freq: Every day | ORAL | Status: DC
  Administered 2018-11-20: 300 mg via ORAL
  Filled 2018-11-19: qty 1

## 2018-11-19 MED ORDER — ONDANSETRON HCL 4 MG/2ML IJ SOLN: 4.0000 mg | Freq: Four times a day (QID) | INTRAMUSCULAR | Status: DC | PRN

## 2018-11-19 MED ORDER — MIDAZOLAM HCL 2 MG/2ML IJ SOLN
INTRAMUSCULAR | Status: AC
  Filled 2018-11-19: qty 2

## 2018-11-19 MED ORDER — FENTANYL CITRATE (PF) 100 MCG/2ML IJ SOLN
INTRAMUSCULAR | Status: AC
  Filled 2018-11-19: qty 2

## 2018-11-19 MED ORDER — MELATONIN 3 MG PO TABS
6.0000 mg | ORAL_TABLET | Freq: Every evening | ORAL | Status: DC | PRN
  Filled 2018-11-19: qty 2

## 2018-11-19 MED ORDER — SILVER SULFADIAZINE 1 % EX CREA
1.0000 "application " | TOPICAL_CREAM | Freq: Every day | CUTANEOUS | Status: DC | PRN
  Filled 2018-11-19: qty 85

## 2018-11-19 MED ORDER — TESTOSTERONE CYPIONATE 200 MG/ML IM SOLN: 150.0000 mg | INTRAMUSCULAR | Status: DC

## 2018-11-19 MED ORDER — SODIUM CHLORIDE 0.9% FLUSH: 3.0000 mL | INTRAVENOUS | Status: DC | PRN

## 2018-11-19 MED ORDER — ADULT MULTIVITAMIN W/MINERALS CH: 1.0000 | ORAL_TABLET | ORAL | Status: DC

## 2018-11-19 MED ORDER — SODIUM CHLORIDE 0.9% FLUSH
3.0000 mL | Freq: Two times a day (BID) | INTRAVENOUS | Status: DC
  Administered 2018-11-20 (×2): 3 mL via INTRAVENOUS

## 2018-11-19 MED ORDER — FENTANYL CITRATE (PF) 100 MCG/2ML IJ SOLN
INTRAMUSCULAR | Status: DC | PRN
  Administered 2018-11-19: 50 ug via INTRAVENOUS

## 2018-11-19 MED ORDER — CYCLOBENZAPRINE HCL 10 MG PO TABS: 10.0000 mg | ORAL_TABLET | Freq: Three times a day (TID) | ORAL | Status: DC | PRN

## 2018-11-19 MED ORDER — HEPARIN SODIUM (PORCINE) 1000 UNIT/ML IJ SOLN
INTRAMUSCULAR | Status: DC | PRN
  Administered 2018-11-19 (×2): 1500 [IU] via INTRAVENOUS
  Administered 2018-11-19: 7000 [IU] via INTRAVENOUS

## 2018-11-19 MED ORDER — MIDAZOLAM HCL 2 MG/2ML IJ SOLN
INTRAMUSCULAR | Status: DC | PRN
  Administered 2018-11-19: 1 mg via INTRAVENOUS

## 2018-11-19 MED ORDER — ASPIRIN EC 81 MG PO TBEC
81.0000 mg | DELAYED_RELEASE_TABLET | Freq: Every day | ORAL | Status: DC
  Administered 2018-11-20: 81 mg via ORAL
  Filled 2018-11-19: qty 1

## 2018-11-19 MED ORDER — ATORVASTATIN CALCIUM 10 MG PO TABS: 10.0000 mg | ORAL_TABLET | Freq: Every day | ORAL | Status: DC

## 2018-11-19 SURGICAL SUPPLY — 24 items
BALLN MUSTANG 4X150X135 (BALLOONS) ×2
BALLOON MUSTANG 4X150X135 (BALLOONS) IMPLANT
CATH ANGIO 5F PIGTAIL 65CM (CATHETERS) ×1 IMPLANT
CATH CROSS OVER TEMPO 5F (CATHETERS) ×1 IMPLANT
CATH MUSTANG 3X120X135 (BALLOONS) ×1 IMPLANT
CATH QUICKCROSS .035X135CM (MICROCATHETER) ×1 IMPLANT
CATH QUICKCROSS SUPP .035X90CM (MICROCATHETER) ×1 IMPLANT
CATH STRAIGHT 5FR 65CM (CATHETERS) ×1 IMPLANT
DEVICE TORQUE .025-.038 (MISCELLANEOUS) ×1 IMPLANT
GLIDEWIRE ADV .035X180CM (WIRE) ×1 IMPLANT
KIT ENCORE 26 ADVANTAGE (KITS) ×1 IMPLANT
KIT MICROPUNCTURE NIT STIFF (SHEATH) ×1 IMPLANT
KIT PV (KITS) ×2 IMPLANT
SHEATH HIGHFLEX ANSEL 6FRX55 (SHEATH) ×1 IMPLANT
SHEATH PINNACLE 5F 10CM (SHEATH) ×1 IMPLANT
SHEATH PROBE COVER 6X72 (BAG) ×1 IMPLANT
STENT INNOVA 5X150X130 (Permanent Stent) ×1 IMPLANT
STOPCOCK MORSE 400PSI 3WAY (MISCELLANEOUS) ×1 IMPLANT
SYR MEDRAD MARK 7 150ML (SYRINGE) ×2 IMPLANT
TRANSDUCER W/STOPCOCK (MISCELLANEOUS) ×2 IMPLANT
TRAY PV CATH (CUSTOM PROCEDURE TRAY) ×2 IMPLANT
TUBING CIL FLEX 10 FLL-RA (TUBING) ×1 IMPLANT
WIRE BENTSON .035X145CM (WIRE) ×1 IMPLANT
WIRE HI TORQ VERSACORE J 260CM (WIRE) ×1 IMPLANT

## 2018-11-19 NOTE — Interval H&P Note (Signed)
History and Physical Interval Note:  11/19/2018 1:41 PM  Dustin Medina  has presented today for surgery, with the diagnosis of Atherosclerosis off artery.  The various methods of treatment have been discussed with the patient and family. After consideration of risks, benefits and other options for treatment, the patient has consented to  Procedure(s): ABDOMINAL AORTOGRAM W/LOWER EXTREMITY (Bilateral) as a surgical intervention.  The patient's history has been reviewed, patient examined, no change in status, stable for surgery.  I have reviewed the patient's chart and labs.  Questions were answered to the patient's satisfaction.     [name]

## 2018-11-19 NOTE — Op Note (Signed)
PATIENT: Dustin Medina      MRN: [id number] DOB: [birth year]    DATE OF PROCEDURE: 11/19/2018  INDICATIONS:    Dustin Medina is a 68 y.o. male who presented with extensive wounds of the right foot.  He had undergone a previous common iliac artery angioplasty and stenting on the right.  He had progression of his wounds and the only remaining option for revascularization was to address a long segment stenosis in the right superficial femoral artery.  PROCEDURE:    1.  Conscious sedation 2.  Ultrasound-guided access to the left common femoral artery 3.  Aortogram with bilateral iliac arteriogram 4.  Selective catheterization of the right superficial femoral artery (third order catheterization) 5.  Angioplasty and stenting of the right superficial femoral artery (5 mm x 150 mm self-expanding stent-postdilatation with 4 mm x 150 mm balloon) 6.  Right lower extremity runoff  SURGEON: Judeth Cornfield. Scot Dock, MD, FACS  ANESTHESIA: Local with sedation  EBL: Minimal  TECHNIQUE: The patient was taken to the peripheral vascular lab and was sedated. The period of conscious sedation was 76 minutes.  During that time period, I was present face-to-face 100% of the time.  The patient was administered 1 mg of Versed and 50 mcg of fentanyl. The patient's heart rate, blood pressure, and oxygen saturation were monitored by the nurse continuously during the procedure.  Both groins were prepped and draped in the usual sterile fashion.  Under ultrasound guidance, after the skin was anesthetized, I cannulated the left common femoral artery with a micropuncture needle and a micropuncture sheath was introduced over a wire.  This was exchanged for a 5 [place] sheath over a Bentson wire.  By ultrasound the femoral artery was patent. A real-time image was obtained and sent to the server.  The pigtail catheter was positioned at the L1 vertebral body and flush aortogram obtained.  I then exchanged the pigtail catheter  for a crossover catheter which was positioned into the proximal right common iliac artery.  I advanced the wire down into the superficial femoral artery and then exchanged with a crossover catheter for a straight catheter.  I then placed a long 6 French sheath over the wire into the superficial femoral artery.  Selective right superficial femoral artery arteriogram was obtained which demonstrated a long segment stenosis in the superficial femoral artery.  The patient was then heparinized.  I was able to cross the lesion using a advantage wire.  I then advanced a 90 cm quick cross catheter over the wire and remove the wire.  Injection showed I was within the lumen distally.  I elected to predilate the lesion with a 3 mm x 120 cm balloon.  The wire was passed again through the quick cross catheter and the catheter removed.  The balloon was positioned across the stenosis and inflated to nominal pressure.  Completion film showed significant dissection.  I therefore elected to stent this.  I selected a 5 mm x 150 cm self-expanding stent.  This was deployed across the area of disease.  Postdilatation was done with a 4 mm x 150 cm balloon.  Completion film showed no residual stenosis.  Next step films were obtained to the remainder of the right lower extremity.  FINDINGS:   1.  The infrarenal aorta, bilateral common iliac arteries and external iliac arteries are patent.  The stent in the right common iliac artery is widely patent. 2.  On the right side, the common femoral and deep  femoral artery are widely patent.  There was a long segment stenosis in the superficial femoral artery that was addressed with angioplasty and stenting as described above.  This left no residual stenosis. 3.  Below this the popliteal, anterior tibial, tibioperoneal trunk, peroneal, and posterior tibial arteries were patent.   [name], MD, FACS [facility] [place] DICTATION:   11/19/2018

## 2018-11-19 NOTE — Progress Notes (Signed)
Patient stated he has ulcer to buttock and one on both feet.

## 2018-11-19 NOTE — Progress Notes (Signed)
Site area: left groin  Site Prior to Removal:  Level 0  Pressure Applied For 22 MINUTES    Minutes Beginning at 1720  Manual:   Yes.    Patient Status During Pull:  Stable   Post Pull Groin Site:  Level 0  Post Pull Instructions Given:  Yes.    Post Pull Pulses Present:  Yes.    Dressing Applied:  Yes.    Comments:  Bed rest started at 1730 X 4 hr

## 2018-11-19 NOTE — Progress Notes (Signed)
Patient arrived to 4E room 06 from cath lab after RLE angiogram through the left groin.  Groin site is level 0.  CHG completed.  Telemetry monitor applied and CCMD notified.  Patient oriented to unit and room to include call light and phone.  Will continue to monitor.

## 2018-11-20 DIAGNOSIS — I70233 Atherosclerosis of native arteries of right leg with ulceration of ankle: Secondary | ICD-10-CM | POA: Diagnosis not present

## 2018-11-20 NOTE — Discharge Summary (Signed)
Physician Discharge Summary   Patient ID: Dustin Medina [id number] 68 y.o. [birth year]  Admit date: 11/19/2018  Discharge date and time: 11/20/18   Admitting Physician: Angelia Mould, MD   Discharge Physician: Dr. Carlis Abbott  Admission Diagnoses: PVD (peripheral vascular disease) [facility]) [I73.9]  Discharge Diagnoses: same  Admission Condition: fair  Discharged Condition: fair  Indication for Admission: Critical limb ischemia with tissue loss right lower extremity  Hospital Course: [name] is a 68 year old male who came in as an outpatient for right lower extremity arteriogram due to extensive wounds of right foot.  He underwent right SFA angioplasty and stenting by Dr. Scot Dock on 11/19/2018.  He tolerated the procedure well and was admitted to the hospital overnight.  POD #1 left groin access site is without a palpable hematoma.  He is feeling ready for discharge home.  He will follow-up in office with Dr. Scot Dock in several weeks for wound check.  He is aware despite intervention he is still at high risk for amputation of right lower extremity due to extensive tissue loss.  He will continue his aspirin, Plavix, and statin.  Discharge instructions were reviewed with the patient and he voices his understanding.  He will be discharged this morning in stable condition.  Consults: None  Treatments: surgery: Right lower extremity arteriogram with right SFA angioplasty and stenting by Dr. Scot Dock on 11/19/2018  Discharge Exam: See progress note 11/20/2018 Vitals:   11/20/18 0411 11/20/18 0833  BP: 127/70 126/67  Pulse: 67 79  Resp: 12 19  Temp: 98.5 F (36.9 C) 98.1 F (36.7 C)  SpO2: 100% 97%     Disposition: Discharge disposition: 01-Home or Self Care       Patient Instructions:  Allergies as of 11/20/2018   No Known Allergies     Medication List    TAKE these medications   aspirin 81 MG EC tablet Take 1 tablet (81 mg total) by mouth daily. What changed:  when to take this   atorvastatin 10 MG tablet Commonly known as: LIPITOR Take 1 tablet (10 mg total) by mouth daily at 6 PM.   clopidogrel 75 MG tablet Commonly known as: PLAVIX Take 1 tablet (75 mg total) by mouth daily with breakfast.   cyclobenzaprine 10 MG tablet Commonly known as: FLEXERIL TAKE 1 TABLET (10 MG TOTAL) BY MOUTH 3 (THREE) TIMES DAILY AS NEEDED FOR MUSCLE SPASMS. What changed: See the new instructions.   ibuprofen 200 MG tablet Commonly known as: ADVIL Take 200 mg by mouth every 6 (six) hours as needed for headache or moderate pain.   Melatonin 5 MG Caps Take 5 mg by mouth at bedtime as needed (sleep).   meloxicam 15 MG tablet Commonly known as: MOBIC TAKE 1 TABLET (15 MG TOTAL) BY MOUTH DAILY AS NEEDED. What changed: reasons to take this   multivitamin with minerals Tabs tablet Take 1 tablet by mouth every other day.   niacin 1000 MG CR tablet Commonly known as: NIASPAN TAKE 1 TABLET (1,000 MG TOTAL) BY MOUTH AT BEDTIME. What changed: See the new instructions.   OVER THE COUNTER MEDICATION Take 1 tablet by mouth every other day. leg vein essentials otc supplement   Pro Comfort Tens Unit [place] Apply 1 Units topically daily as needed.   Pro Comfort Tens Electrodes Misc Apply 2 patches topically daily as needed.   promethazine 25 MG tablet Commonly known as: PHENERGAN Take 1 tablet (25 mg total) by mouth every 8 (eight) hours as needed for  nausea or vomiting.   silver sulfADIAZINE 1 % cream Commonly known as: SILVADENE Apply 1 application topically daily as needed (wound care).   testosterone cypionate 200 MG/ML injection Commonly known as: DEPOTESTOSTERONE CYPIONATE Inject 0.75 mLs (150 mg total) into the muscle every 14 (fourteen) days.   trimethoprim 100 MG tablet Commonly known as: TRIMPEX TAKE 1 TABLET BY MOUTH EVERY DAY What changed: when to take this   valsartan-hydrochlorothiazide 320-25 MG tablet Commonly known as: DIOVAN-HCT TAKE  1 TABLET BY MOUTH EVERY DAY      Activity: activity as tolerated Diet: regular diet Wound Care: Continue current wound care  Follow-up with Dr. Scot Dock in 3 weeks.  SignedDagoberto Ligas 11/20/2018 10:22 AM

## 2018-11-20 NOTE — Progress Notes (Addendum)
  Progress Note    11/20/2018 8:38 AM 1 Day Post-Op  Subjective:  No complaints.  Ready for discharge home   Vitals:   11/20/18 0411 11/20/18 0833  BP: 127/70 126/67  Pulse: 67 79  Resp: 12 19  Temp: 98.5 F (36.9 C) 98.1 F (36.7 C)  SpO2: 100% 97%   Physical Exam: Lungs:  Non labored Incisions:  L groin cath site without hematoma Abdomen:  soft Neurologic: A&O  CBC    Component Value Date/Time   WBC 14.7 (H) 11/08/2018 0254   RBC 4.57 11/08/2018 0254   HGB 11.2 (L) 11/19/2018 1003   HCT 33.0 (L) 11/19/2018 1003   PLT 577 (H) 11/08/2018 0254   MCV 80.3 11/08/2018 0254   MCH 24.1 (L) 11/08/2018 0254   MCHC 30.0 11/08/2018 0254   RDW 16.4 (H) 11/08/2018 0254   RDW 14.5 06/20/2013 1134   LYMPHSABS 0.8 09/28/2013 1940   MONOABS 0.7 09/28/2013 1940   EOSABS 0.0 09/28/2013 1940   BASOSABS 0.0 09/28/2013 1940    BMET    Component Value Date/Time   NA 139 11/19/2018 1003   NA 138 07/16/2016 1032   K 3.2 (L) 11/19/2018 1003   CL 102 11/19/2018 1003   CO2 24 04/09/2017 1410   GLUCOSE 113 (H) 11/19/2018 1003   BUN 11 11/19/2018 1003   BUN 12 07/16/2016 1032   CREATININE 0.50 (L) 11/19/2018 1003   CALCIUM 8.8 (L) 04/09/2017 1410   GFRNONAA >60 07/16/2018 1347   GFRAA >60 07/16/2018 1347    INR No results found for: INR   Intake/Output Summary (Last 24 hours) at 11/20/2018 [id number] Last data filed at 11/20/2018 0228 Gross per 24 hour  Intake 728.78 ml  Output 700 ml  Net 28.78 ml     Assessment/Plan:  68 y.o. male is s/p R SFA stenting 1 Day Post-Op   L groin cath site without hematoma Ok for discharge home this morning Follow up for wound check in 3-4 weeks Patient aware he is high risk for limb loss given extensive tissue loss   Dagoberto Ligas, PA-C [facility] [phone] 11/20/2018 8:38 AM   I have seen and evaluated the patient. I agree with the PA note as documented above. POD#1 s/p R SFA intervention.  Left groin looks  good.  OK for discharge.  F/U with Dr. Scot Dock in clinic arranged.   Marty Heck, MD [facility] [place] Office: [phone] Pager: [phone]

## 2018-11-20 NOTE — Discharge Instructions (Signed)
° °  [facility] ° °Discharge Instructions ° °Lower Extremity Angiogram; Angioplasty/Stenting ° °Please refer to the following instructions for your post-procedure care. Your surgeon or physician assistant will discuss any changes with you. ° °Activity ° °Avoid lifting more than 8 pounds (1 gallons of milk) for 72 hours (3 days) after your procedure. You may walk as much as you can tolerate. It's OK to drive after 72 hours. ° °Bathing/Showering ° °You may shower the day after your procedure. If you have a bandage, you may remove it at 24- 48 hours. Clean your incision site with mild soap and water. Pat the area dry with a clean towel. ° °Diet ° °Resume your pre-procedure diet. There are no special food restrictions following this procedure. All patients with peripheral vascular disease should follow a low fat/low cholesterol diet. In order to heal from your surgery, it is CRITICAL to get adequate nutrition. Your body requires vitamins, minerals, and protein. Vegetables are the best source of vitamins and minerals. Vegetables also provide the perfect balance of protein. Processed food has little nutritional value, so try to avoid this. ° °Medications ° °Resume taking all of your medications unless your doctor tells you not to. If your incision is causing pain, you may take over-the-counter pain relievers such as acetaminophen (Tylenol) ° °Follow Up ° °Follow up will be arranged at the time of your procedure. You may have an office visit scheduled or may be scheduled for surgery. Ask your surgeon if you have any questions. ° °Please call us immediately for any of the following conditions: °•Severe or worsening pain your legs or feet at rest or with walking. °•Increased pain, redness, drainage at your groin puncture site. °•Fever of 101 degrees or higher. °•If you have any mild or slow bleeding from your puncture site: lie down, apply firm constant pressure over the area with a piece of  gauze or a clean wash cloth for 30 minutes- no peeking!, call 911 right away if you are still bleeding after 30 minutes, or if the bleeding is heavy and unmanageable. ° °Reduce your risk factors of vascular disease: ° °Stop smoking. If you would like help call [facility] at 1-800-QUIT-NOW (1-800-784-8669) or [place] at 336-586-4000. °Manage your cholesterol °Maintain a desired weight °Control your diabetes °Keep your blood pressure down ° °If you have any questions, please call the office at [phone] ° °

## 2018-11-20 NOTE — Progress Notes (Signed)
Discharge AVS meds take and those due reviewed with pt. Follow up appointments and when to call MD reviewed. All questions and concerns addressed. No further questions at this time. D/c IV and TELE, CCMD notified. D/C home per orders. Pt brought down via personal wheelchair with all belonging.

## 2018-11-20 NOTE — Plan of Care (Signed)
°  Problem: Clinical Measurements: °Goal: Respiratory complications will improve °Outcome: Progressing °Goal: Cardiovascular complication will be avoided °Outcome: Progressing °  °Problem: Activity: °Goal: Risk for activity intolerance will decrease °Outcome: Progressing °  °

## 2018-11-22 ENCOUNTER — Encounter (HOSPITAL_COMMUNITY): Payer: Self-pay | Admitting: Vascular Surgery

## 2018-11-23 DIAGNOSIS — L89899 Pressure ulcer of other site, unspecified stage: Secondary | ICD-10-CM | POA: Diagnosis not present

## 2018-11-23 DIAGNOSIS — L89519 Pressure ulcer of right ankle, unspecified stage: Secondary | ICD-10-CM | POA: Diagnosis not present

## 2018-11-23 DIAGNOSIS — I7090 Unspecified atherosclerosis: Secondary | ICD-10-CM | POA: Diagnosis not present

## 2018-11-30 DIAGNOSIS — L89899 Pressure ulcer of other site, unspecified stage: Secondary | ICD-10-CM | POA: Diagnosis not present

## 2018-12-06 ENCOUNTER — Other Ambulatory Visit: Payer: Self-pay | Admitting: Nurse Practitioner

## 2018-12-14 DIAGNOSIS — L97319 Non-pressure chronic ulcer of right ankle with unspecified severity: Secondary | ICD-10-CM | POA: Diagnosis not present

## 2018-12-14 DIAGNOSIS — I739 Peripheral vascular disease, unspecified: Secondary | ICD-10-CM | POA: Diagnosis not present

## 2018-12-14 DIAGNOSIS — I872 Venous insufficiency (chronic) (peripheral): Secondary | ICD-10-CM | POA: Diagnosis not present

## 2018-12-14 DIAGNOSIS — L97519 Non-pressure chronic ulcer of other part of right foot with unspecified severity: Secondary | ICD-10-CM | POA: Diagnosis not present

## 2018-12-21 DIAGNOSIS — I739 Peripheral vascular disease, unspecified: Secondary | ICD-10-CM | POA: Diagnosis not present

## 2018-12-21 DIAGNOSIS — L89529 Pressure ulcer of left ankle, unspecified stage: Secondary | ICD-10-CM | POA: Diagnosis not present

## 2018-12-21 DIAGNOSIS — L89519 Pressure ulcer of right ankle, unspecified stage: Secondary | ICD-10-CM | POA: Diagnosis not present

## 2018-12-21 DIAGNOSIS — L89899 Pressure ulcer of other site, unspecified stage: Secondary | ICD-10-CM | POA: Diagnosis not present

## 2018-12-21 DIAGNOSIS — I7090 Unspecified atherosclerosis: Secondary | ICD-10-CM | POA: Diagnosis not present

## 2018-12-22 ENCOUNTER — Encounter: Payer: Self-pay | Admitting: Vascular Surgery

## 2018-12-22 ENCOUNTER — Other Ambulatory Visit: Payer: Self-pay

## 2018-12-22 ENCOUNTER — Ambulatory Visit (INDEPENDENT_AMBULATORY_CARE_PROVIDER_SITE_OTHER): Payer: PPO | Admitting: Vascular Surgery

## 2018-12-22 VITALS — BP 133/79 | HR 83 | Temp 97.4°F | Resp 20 | Ht 71.0 in | Wt 178.0 lb

## 2018-12-22 DIAGNOSIS — I70299 Other atherosclerosis of native arteries of extremities, unspecified extremity: Secondary | ICD-10-CM

## 2018-12-22 DIAGNOSIS — Z48812 Encounter for surgical aftercare following surgery on the circulatory system: Secondary | ICD-10-CM

## 2018-12-22 DIAGNOSIS — L97909 Non-pressure chronic ulcer of unspecified part of unspecified lower leg with unspecified severity: Secondary | ICD-10-CM

## 2018-12-22 NOTE — Progress Notes (Signed)
Patient name: Dustin Medina MRN: [id number] DOB: [birth year] Sex: male  REASON FOR VISIT:   Follow-up after angioplasty and stenting of the right superficial femoral artery  HPI:   Dustin Medina is a pleasant 68 y.o. male who would presented with extensive wounds of his right foot.  He had undergone previous common iliac artery angioplasty and stenting on the right.  He had progression of his wounds despite this and his only remaining option was to address a long segment stenosis in the right superficial femoral artery.  On 11/19/2018 the patient underwent angioplasty and stenting of the right superficial femoral artery.  He comes in for a follow-up visit.  His wounds are documented on the photos on 11/10/2018.  He had extensive wound especially between the third fourth and fifth toes of the right foot.  He also had an extensive wound on the right lateral malleolus.  His wounds are followed at the wound care center.  He does dressing changes himself at home.  Has no specific complaints today.  Current Outpatient Medications  Medication Sig Dispense Refill  . aspirin EC 81 MG EC tablet Take 1 tablet (81 mg total) by mouth daily. (Patient taking differently: Take 81 mg by mouth 3 (three) times a week. )    . atorvastatin (LIPITOR) 10 MG tablet Take 1 tablet (10 mg total) by mouth daily at 6 PM. 30 tablet 3  . clopidogrel (PLAVIX) 75 MG tablet Take 1 tablet (75 mg total) by mouth daily with breakfast. 30 tablet 6  . cyclobenzaprine (FLEXERIL) 10 MG tablet TAKE 1 TABLET (10 MG TOTAL) BY MOUTH 3 (THREE) TIMES DAILY AS NEEDED FOR MUSCLE SPASMS. (Patient taking differently: Take 10 mg by mouth 3 (three) times daily as needed for muscle spasms. ) 30 tablet 0  . ibuprofen (ADVIL) 200 MG tablet Take 200 mg by mouth every 6 (six) hours as needed for headache or moderate pain.    . Melatonin 5 MG CAPS Take 5 mg by mouth at bedtime as needed (sleep).    . meloxicam (MOBIC) 15 MG tablet TAKE 1 TABLET (15 MG  TOTAL) BY MOUTH DAILY AS NEEDED. 90 tablet 0  . Multiple Vitamin (MULITIVITAMIN WITH MINERALS) TABS Take 1 tablet by mouth every other day.     . Nerve Stimulator (PRO COMFORT TENS ELECTRODES) MISC Apply 2 patches topically daily as needed. 60 each 11  . Nerve Stimulator (PRO COMFORT TENS UNIT) DEVI Apply 1 Units topically daily as needed. 1 Device 0  . niacin (NIASPAN) 1000 MG CR tablet TAKE 1 TABLET (1,000 MG TOTAL) BY MOUTH AT BEDTIME. 90 tablet 0  . OVER THE COUNTER MEDICATION Take 1 tablet by mouth every other day. leg vein essentials otc supplement    . promethazine (PHENERGAN) 25 MG tablet Take 1 tablet (25 mg total) by mouth every 8 (eight) hours as needed for nausea or vomiting. 20 tablet 1  . silver sulfADIAZINE (SILVADENE) 1 % cream Apply 1 application topically daily as needed (wound care).     Marland Kitchen testosterone cypionate (DEPOTESTOSTERONE CYPIONATE) 200 MG/ML injection Inject 0.75 mLs (150 mg total) into the muscle every 14 (fourteen) days. 10 mL 5  . trimethoprim (TRIMPEX) 100 MG tablet TAKE 1 TABLET BY MOUTH EVERY DAY (Patient taking differently: Take 100 mg by mouth every other day. ) 30 tablet 2  . valsartan-hydrochlorothiazide (DIOVAN-HCT) 320-25 MG tablet TAKE 1 TABLET BY MOUTH EVERY DAY (Patient taking differently: Take 1 tablet by mouth daily. ) 90  tablet 0   No current facility-administered medications for this visit.     REVIEW OF SYSTEMS:  [X]  denotes positive finding, [ ]  denotes negative finding Vascular    Leg swelling    Cardiac    Chest pain or chest pressure:    Shortness of breath upon exertion:    Short of breath when lying flat:    Irregular heart rhythm:    Constitutional    Fever or chills:     PHYSICAL EXAM:   Vitals:   12/22/18 0822  BP: 133/79  Pulse: 83  Resp: 20  Temp: (!) 97.4 F (36.3 C)  SpO2: 99%  Weight: 178 lb (80.7 kg)  Height: 5\' 11"  (1.803 m)    GENERAL: The patient is a well-nourished male, in no acute distress. The vital signs  are documented above. CARDIOVASCULAR: There is a regular rate and rhythm. PULMONARY: There is good air exchange bilaterally without wheezing or rales. VASCULAR: He has a biphasic posterior tibial signal with a Doppler and a monophasic lateral tarsal and anterior tibial signal. EXTREMITIES: His lateral malleolar wound measures 2 cm in maximum diameter.  The wounds are documented below.  I think the wound between the toes has improved some.       DATA:   No new data  MEDICAL ISSUES:   STATUS POST RIGHT LOWER EXTREMITY REVASCULARIZATION: This patient is undergone angioplasty and stenting of the right common iliac artery and also the right superficial femoral artery.  She has three-vessel he has three-vessel runoff on the right.  At this point I explained that we have done everything we can with respect to his circulation and hopefully these wounds will heal with aggressive wound care by the wound care center.  He is on aspirin, Plavix, and a statin.  I have ordered ABIs and a duplex in 3 months and I will see him back at that time.  He knows to call sooner if he has problems.  [name] [facility] [phone]

## 2018-12-23 ENCOUNTER — Other Ambulatory Visit: Payer: Self-pay

## 2018-12-23 DIAGNOSIS — L97909 Non-pressure chronic ulcer of unspecified part of unspecified lower leg with unspecified severity: Secondary | ICD-10-CM

## 2018-12-23 DIAGNOSIS — I70299 Other atherosclerosis of native arteries of extremities, unspecified extremity: Secondary | ICD-10-CM

## 2018-12-28 DIAGNOSIS — R339 Retention of urine, unspecified: Secondary | ICD-10-CM | POA: Diagnosis not present

## 2018-12-28 DIAGNOSIS — R972 Elevated prostate specific antigen [PSA]: Secondary | ICD-10-CM | POA: Diagnosis not present

## 2018-12-28 DIAGNOSIS — N319 Neuromuscular dysfunction of bladder, unspecified: Secondary | ICD-10-CM | POA: Diagnosis not present

## 2018-12-28 DIAGNOSIS — N390 Urinary tract infection, site not specified: Secondary | ICD-10-CM | POA: Diagnosis not present

## 2018-12-29 ENCOUNTER — Other Ambulatory Visit: Payer: Self-pay | Admitting: Family

## 2018-12-31 DIAGNOSIS — L89899 Pressure ulcer of other site, unspecified stage: Secondary | ICD-10-CM | POA: Diagnosis not present

## 2019-01-04 DIAGNOSIS — N5201 Erectile dysfunction due to arterial insufficiency: Secondary | ICD-10-CM | POA: Diagnosis not present

## 2019-01-04 DIAGNOSIS — R972 Elevated prostate specific antigen [PSA]: Secondary | ICD-10-CM | POA: Diagnosis not present

## 2019-01-04 DIAGNOSIS — N3020 Other chronic cystitis without hematuria: Secondary | ICD-10-CM | POA: Diagnosis not present

## 2019-01-11 DIAGNOSIS — L89899 Pressure ulcer of other site, unspecified stage: Secondary | ICD-10-CM | POA: Diagnosis not present

## 2019-01-12 ENCOUNTER — Other Ambulatory Visit: Payer: Self-pay | Admitting: Family

## 2019-01-25 DIAGNOSIS — L89519 Pressure ulcer of right ankle, unspecified stage: Secondary | ICD-10-CM | POA: Diagnosis not present

## 2019-01-25 DIAGNOSIS — L89529 Pressure ulcer of left ankle, unspecified stage: Secondary | ICD-10-CM | POA: Diagnosis not present

## 2019-01-25 DIAGNOSIS — I739 Peripheral vascular disease, unspecified: Secondary | ICD-10-CM | POA: Diagnosis not present

## 2019-01-25 DIAGNOSIS — L89899 Pressure ulcer of other site, unspecified stage: Secondary | ICD-10-CM | POA: Diagnosis not present

## 2019-02-03 ENCOUNTER — Other Ambulatory Visit: Payer: Self-pay | Admitting: Nurse Practitioner

## 2019-02-08 DIAGNOSIS — L97519 Non-pressure chronic ulcer of other part of right foot with unspecified severity: Secondary | ICD-10-CM | POA: Diagnosis not present

## 2019-02-08 DIAGNOSIS — I739 Peripheral vascular disease, unspecified: Secondary | ICD-10-CM | POA: Diagnosis not present

## 2019-02-08 DIAGNOSIS — L97319 Non-pressure chronic ulcer of right ankle with unspecified severity: Secondary | ICD-10-CM | POA: Diagnosis not present

## 2019-02-08 DIAGNOSIS — L97529 Non-pressure chronic ulcer of other part of left foot with unspecified severity: Secondary | ICD-10-CM | POA: Diagnosis not present

## 2019-02-08 DIAGNOSIS — L97329 Non-pressure chronic ulcer of left ankle with unspecified severity: Secondary | ICD-10-CM | POA: Diagnosis not present

## 2019-02-15 DIAGNOSIS — N390 Urinary tract infection, site not specified: Secondary | ICD-10-CM | POA: Diagnosis not present

## 2019-02-15 DIAGNOSIS — N319 Neuromuscular dysfunction of bladder, unspecified: Secondary | ICD-10-CM | POA: Diagnosis not present

## 2019-02-22 DIAGNOSIS — I739 Peripheral vascular disease, unspecified: Secondary | ICD-10-CM | POA: Diagnosis not present

## 2019-02-22 DIAGNOSIS — L97329 Non-pressure chronic ulcer of left ankle with unspecified severity: Secondary | ICD-10-CM | POA: Diagnosis not present

## 2019-02-22 DIAGNOSIS — L97319 Non-pressure chronic ulcer of right ankle with unspecified severity: Secondary | ICD-10-CM | POA: Diagnosis not present

## 2019-02-22 DIAGNOSIS — L89529 Pressure ulcer of left ankle, unspecified stage: Secondary | ICD-10-CM | POA: Diagnosis not present

## 2019-02-22 DIAGNOSIS — L97529 Non-pressure chronic ulcer of other part of left foot with unspecified severity: Secondary | ICD-10-CM | POA: Diagnosis not present

## 2019-02-22 DIAGNOSIS — L97519 Non-pressure chronic ulcer of other part of right foot with unspecified severity: Secondary | ICD-10-CM | POA: Diagnosis not present

## 2019-02-22 DIAGNOSIS — L89519 Pressure ulcer of right ankle, unspecified stage: Secondary | ICD-10-CM | POA: Diagnosis not present

## 2019-02-22 DIAGNOSIS — L89899 Pressure ulcer of other site, unspecified stage: Secondary | ICD-10-CM | POA: Diagnosis not present

## 2019-02-28 ENCOUNTER — Other Ambulatory Visit: Payer: Self-pay | Admitting: Family

## 2019-02-28 ENCOUNTER — Other Ambulatory Visit: Payer: Self-pay | Admitting: Nurse Practitioner

## 2019-02-28 DIAGNOSIS — I10 Essential (primary) hypertension: Secondary | ICD-10-CM

## 2019-03-01 ENCOUNTER — Other Ambulatory Visit: Payer: Self-pay | Admitting: Nurse Practitioner

## 2019-03-11 DIAGNOSIS — L97519 Non-pressure chronic ulcer of other part of right foot with unspecified severity: Secondary | ICD-10-CM | POA: Diagnosis not present

## 2019-03-11 DIAGNOSIS — L97329 Non-pressure chronic ulcer of left ankle with unspecified severity: Secondary | ICD-10-CM | POA: Diagnosis not present

## 2019-03-11 DIAGNOSIS — I2510 Atherosclerotic heart disease of native coronary artery without angina pectoris: Secondary | ICD-10-CM | POA: Diagnosis not present

## 2019-03-11 DIAGNOSIS — L97529 Non-pressure chronic ulcer of other part of left foot with unspecified severity: Secondary | ICD-10-CM | POA: Diagnosis not present

## 2019-03-11 DIAGNOSIS — L97319 Non-pressure chronic ulcer of right ankle with unspecified severity: Secondary | ICD-10-CM | POA: Diagnosis not present

## 2019-03-12 DIAGNOSIS — Z8711 Personal history of peptic ulcer disease: Secondary | ICD-10-CM | POA: Diagnosis not present

## 2019-03-12 DIAGNOSIS — M545 Low back pain: Secondary | ICD-10-CM | POA: Diagnosis not present

## 2019-03-12 DIAGNOSIS — K922 Gastrointestinal hemorrhage, unspecified: Secondary | ICD-10-CM | POA: Diagnosis not present

## 2019-03-12 DIAGNOSIS — R4781 Slurred speech: Secondary | ICD-10-CM | POA: Diagnosis not present

## 2019-03-12 DIAGNOSIS — Z20822 Contact with and (suspected) exposure to covid-19: Secondary | ICD-10-CM | POA: Diagnosis not present

## 2019-03-12 DIAGNOSIS — K2210 Ulcer of esophagus without bleeding: Secondary | ICD-10-CM | POA: Diagnosis not present

## 2019-03-12 DIAGNOSIS — L97419 Non-pressure chronic ulcer of right heel and midfoot with unspecified severity: Secondary | ICD-10-CM | POA: Diagnosis not present

## 2019-03-12 DIAGNOSIS — Z87891 Personal history of nicotine dependence: Secondary | ICD-10-CM | POA: Diagnosis not present

## 2019-03-12 DIAGNOSIS — R531 Weakness: Secondary | ICD-10-CM | POA: Diagnosis not present

## 2019-03-12 DIAGNOSIS — K8080 Other cholelithiasis without obstruction: Secondary | ICD-10-CM | POA: Diagnosis not present

## 2019-03-12 DIAGNOSIS — R000 Tachycardia, unspecified: Secondary | ICD-10-CM | POA: Diagnosis not present

## 2019-03-12 DIAGNOSIS — I10 Essential (primary) hypertension: Secondary | ICD-10-CM | POA: Diagnosis not present

## 2019-03-12 DIAGNOSIS — N281 Cyst of kidney, acquired: Secondary | ICD-10-CM | POA: Diagnosis not present

## 2019-03-12 DIAGNOSIS — L97429 Non-pressure chronic ulcer of left heel and midfoot with unspecified severity: Secondary | ICD-10-CM | POA: Diagnosis not present

## 2019-03-12 DIAGNOSIS — I739 Peripheral vascular disease, unspecified: Secondary | ICD-10-CM | POA: Diagnosis not present

## 2019-03-12 DIAGNOSIS — L97519 Non-pressure chronic ulcer of other part of right foot with unspecified severity: Secondary | ICD-10-CM | POA: Diagnosis not present

## 2019-03-12 DIAGNOSIS — I4510 Unspecified right bundle-branch block: Secondary | ICD-10-CM | POA: Diagnosis not present

## 2019-03-12 DIAGNOSIS — J9811 Atelectasis: Secondary | ICD-10-CM | POA: Diagnosis not present

## 2019-03-12 DIAGNOSIS — D649 Anemia, unspecified: Secondary | ICD-10-CM | POA: Diagnosis not present

## 2019-03-12 DIAGNOSIS — R112 Nausea with vomiting, unspecified: Secondary | ICD-10-CM | POA: Diagnosis not present

## 2019-03-12 DIAGNOSIS — K2211 Ulcer of esophagus with bleeding: Secondary | ICD-10-CM | POA: Diagnosis not present

## 2019-03-12 DIAGNOSIS — L97529 Non-pressure chronic ulcer of other part of left foot with unspecified severity: Secondary | ICD-10-CM | POA: Diagnosis not present

## 2019-03-12 DIAGNOSIS — Z95820 Peripheral vascular angioplasty status with implants and grafts: Secondary | ICD-10-CM | POA: Diagnosis not present

## 2019-03-12 DIAGNOSIS — D62 Acute posthemorrhagic anemia: Secondary | ICD-10-CM | POA: Diagnosis not present

## 2019-03-12 DIAGNOSIS — R109 Unspecified abdominal pain: Secondary | ICD-10-CM | POA: Diagnosis not present

## 2019-03-12 DIAGNOSIS — Z7982 Long term (current) use of aspirin: Secondary | ICD-10-CM | POA: Diagnosis not present

## 2019-03-12 DIAGNOSIS — K219 Gastro-esophageal reflux disease without esophagitis: Secondary | ICD-10-CM | POA: Diagnosis not present

## 2019-03-12 DIAGNOSIS — G8220 Paraplegia, unspecified: Secondary | ICD-10-CM | POA: Diagnosis not present

## 2019-03-12 DIAGNOSIS — Z7902 Long term (current) use of antithrombotics/antiplatelets: Secondary | ICD-10-CM | POA: Diagnosis not present

## 2019-03-12 DIAGNOSIS — K920 Hematemesis: Secondary | ICD-10-CM | POA: Diagnosis not present

## 2019-03-12 DIAGNOSIS — R578 Other shock: Secondary | ICD-10-CM | POA: Diagnosis not present

## 2019-03-12 DIAGNOSIS — R918 Other nonspecific abnormal finding of lung field: Secondary | ICD-10-CM | POA: Diagnosis not present

## 2019-03-12 DIAGNOSIS — R29898 Other symptoms and signs involving the musculoskeletal system: Secondary | ICD-10-CM | POA: Diagnosis not present

## 2019-03-12 DIAGNOSIS — S81801D Unspecified open wound, right lower leg, subsequent encounter: Secondary | ICD-10-CM | POA: Diagnosis not present

## 2019-03-12 DIAGNOSIS — R4182 Altered mental status, unspecified: Secondary | ICD-10-CM | POA: Diagnosis not present

## 2019-03-12 DIAGNOSIS — K317 Polyp of stomach and duodenum: Secondary | ICD-10-CM | POA: Diagnosis not present

## 2019-03-12 DIAGNOSIS — K449 Diaphragmatic hernia without obstruction or gangrene: Secondary | ICD-10-CM | POA: Diagnosis not present

## 2019-03-12 DIAGNOSIS — T148XXS Other injury of unspecified body region, sequela: Secondary | ICD-10-CM | POA: Diagnosis not present

## 2019-03-12 DIAGNOSIS — K8020 Calculus of gallbladder without cholecystitis without obstruction: Secondary | ICD-10-CM | POA: Diagnosis not present

## 2019-03-12 DIAGNOSIS — K270 Acute peptic ulcer, site unspecified, with hemorrhage: Secondary | ICD-10-CM | POA: Diagnosis not present

## 2019-03-12 DIAGNOSIS — Z791 Long term (current) use of non-steroidal anti-inflammatories (NSAID): Secondary | ICD-10-CM | POA: Diagnosis not present

## 2019-03-18 DIAGNOSIS — N319 Neuromuscular dysfunction of bladder, unspecified: Secondary | ICD-10-CM | POA: Diagnosis not present

## 2019-03-18 DIAGNOSIS — R339 Retention of urine, unspecified: Secondary | ICD-10-CM | POA: Diagnosis not present

## 2019-03-18 DIAGNOSIS — N390 Urinary tract infection, site not specified: Secondary | ICD-10-CM | POA: Diagnosis not present

## 2019-03-21 ENCOUNTER — Telehealth: Payer: Self-pay | Admitting: Nurse Practitioner

## 2019-03-22 NOTE — Telephone Encounter (Signed)
Pt called and aware of hosp follow up

## 2019-03-23 ENCOUNTER — Encounter (HOSPITAL_COMMUNITY): Payer: PPO

## 2019-03-23 ENCOUNTER — Ambulatory Visit: Payer: PPO | Admitting: Vascular Surgery

## 2019-03-24 ENCOUNTER — Telehealth: Payer: Self-pay | Admitting: *Deleted

## 2019-03-24 NOTE — Telephone Encounter (Signed)
[facility] [address] 40 mg 1 tab BID MD at hospital sent Rx for name brand Insurance will not cover, but will cover generic pantoprazole Please send new Rx or do PA for name brand

## 2019-03-25 ENCOUNTER — Telehealth: Payer: Self-pay | Admitting: *Deleted

## 2019-03-25 DIAGNOSIS — I739 Peripheral vascular disease, unspecified: Secondary | ICD-10-CM | POA: Diagnosis not present

## 2019-03-25 DIAGNOSIS — L89899 Pressure ulcer of other site, unspecified stage: Secondary | ICD-10-CM | POA: Diagnosis not present

## 2019-03-25 DIAGNOSIS — I7090 Unspecified atherosclerosis: Secondary | ICD-10-CM | POA: Diagnosis not present

## 2019-03-25 NOTE — Telephone Encounter (Signed)
Dr Evette Doffing prescribed.

## 2019-03-25 NOTE — Telephone Encounter (Signed)
That was bak [id number]- he needs an appointment

## 2019-03-25 NOTE — Telephone Encounter (Signed)
Protonix is not on his list of meds- who prescribed

## 2019-03-25 NOTE — Telephone Encounter (Signed)
Patient has a hospital follow up appointment scheduled for February 11.

## 2019-03-25 NOTE — Telephone Encounter (Signed)
Please ok generic protonix 40 mg and [facility] this morning. Patient does not want to wait for approval for brand name.  Medicine was prescribed by hospital provider.

## 2019-03-25 NOTE — Telephone Encounter (Signed)
Patient has not been seen by me in awhile and needs followup appointmnet befroe can prescribe.

## 2019-03-25 NOTE — Telephone Encounter (Signed)
Appt made

## 2019-03-28 ENCOUNTER — Encounter: Payer: Self-pay | Admitting: Nurse Practitioner

## 2019-03-28 ENCOUNTER — Other Ambulatory Visit: Payer: Self-pay

## 2019-03-28 ENCOUNTER — Ambulatory Visit (INDEPENDENT_AMBULATORY_CARE_PROVIDER_SITE_OTHER): Payer: PPO | Admitting: Nurse Practitioner

## 2019-03-28 VITALS — BP 146/81 | HR 80 | Temp 97.8°F | Resp 20

## 2019-03-28 DIAGNOSIS — K2101 Gastro-esophageal reflux disease with esophagitis, with bleeding: Secondary | ICD-10-CM | POA: Diagnosis not present

## 2019-03-28 DIAGNOSIS — G8220 Paraplegia, unspecified: Secondary | ICD-10-CM | POA: Diagnosis not present

## 2019-03-28 MED ORDER — PANTOPRAZOLE SODIUM 40 MG PO TBEC: 40.0000 mg | DELAYED_RELEASE_TABLET | Freq: Every day | ORAL | 1 refills | Status: DC

## 2019-03-28 NOTE — Progress Notes (Signed)
   Subjective:    Patient ID: Dustin Medina, male    DOB: 08-18-1950, 69 y.o.   MRN: [id number]   Chief Complaint: Ulcer in esophagus per hospital   HPI Patient wa sin the hospital several weeks ago with bleeding gastric ulcer. He was given 3 units of blod during hospital stay. He was given protonix and needs refill on meds. The protonix really helps.   Review of Systems  Constitutional: Negative for diaphoresis.  Eyes: Negative for pain.  Respiratory: Negative for shortness of breath.   Cardiovascular: Negative for chest pain, palpitations and leg swelling.  Gastrointestinal: Negative for abdominal pain.  Endocrine: Negative for polydipsia.  Skin: Negative for rash.  Neurological: Negative for dizziness, weakness and headaches.  Hematological: Does not bruise/bleed easily.  All other systems reviewed and are negative.      Objective:   Physical Exam Vitals and nursing note reviewed.  Constitutional:      Appearance: Normal appearance.  Cardiovascular:     Rate and Rhythm: Normal rate.     Heart sounds: Normal heart sounds.  Pulmonary:     Breath sounds: Normal breath sounds.  Abdominal:     General: Abdomen is flat. Bowel sounds are normal. There is no distension.     Palpations: Abdomen is soft.     Tenderness: There is no abdominal tenderness.  Skin:    General: Skin is warm.  Neurological:     General: No focal deficit present.     Mental Status: He is alert.  Psychiatric:        Mood and Affect: Mood normal.   BP (!) 146/81   Pulse 80   Temp 97.8 F (36.6 C) (Temporal)   Resp 20   SpO2 100%          Assessment & Plan:  Dustin Medina in today with chief complaint of Ulcer in esophagus per hospital   1. Paraplegia ([place]) - For home use only DME Other see comment  2. Gastroesophageal reflux disease with esophagitis and hemorrhage Reviewed hospital records Avoid spicy foods Do not eat 2 hours prior to bedtime  - pantoprazole (PROTONIX) 40 MG  tablet; Take 1 tablet (40 mg total) by mouth daily.  Dispense: 90 tablet; Refill: 1    The above assessment and management plan was discussed with the patient. The patient verbalized understanding of and has agreed to the management plan. Patient is aware to call the clinic if symptoms persist or worsen. Patient is aware when to return to the clinic for a follow-up visit. Patient educated on when it is appropriate to go to the emergency department.   [name] Hassell Done, FNP

## 2019-03-28 NOTE — Patient Instructions (Signed)

## 2019-03-29 ENCOUNTER — Telehealth (HOSPITAL_COMMUNITY): Payer: Self-pay

## 2019-03-29 LAB — CBC WITH DIFFERENTIAL/PLATELET
Basophils Absolute: 0.1 10*3/uL (ref 0.0–0.2)
Basos: 1 %
EOS (ABSOLUTE): 0.1 10*3/uL (ref 0.0–0.4)
Eos: 2 %
Hematocrit: 32.3 % — ABNORMAL LOW (ref 37.5–51.0)
Hemoglobin: 10 g/dL — ABNORMAL LOW (ref 13.0–17.7)
Immature Grans (Abs): 0.1 10*3/uL (ref 0.0–0.1)
Immature Granulocytes: 1 %
Lymphocytes Absolute: 2.1 10*3/uL (ref 0.7–3.1)
Lymphs: 26 %
MCH: 24.7 pg — ABNORMAL LOW (ref 26.6–33.0)
MCHC: 31 g/dL — ABNORMAL LOW (ref 31.5–35.7)
MCV: 80 fL (ref 79–97)
Monocytes Absolute: 0.9 10*3/uL (ref 0.1–0.9)
Monocytes: 11 %
Neutrophils Absolute: 4.9 10*3/uL (ref 1.4–7.0)
Neutrophils: 59 %
Platelets: 516 10*3/uL — ABNORMAL HIGH (ref 150–450)
RBC: 4.05 x10E6/uL — ABNORMAL LOW (ref 4.14–5.80)
RDW: 18.2 % — ABNORMAL HIGH (ref 11.6–15.4)
WBC: 8.1 10*3/uL (ref 3.4–10.8)

## 2019-03-29 NOTE — Telephone Encounter (Signed)

## 2019-03-29 NOTE — Addendum Note (Signed)
Addended by: Chevis Pretty on: 03/29/2019 09:20 AM   Modules accepted: Orders

## 2019-03-30 ENCOUNTER — Ambulatory Visit (HOSPITAL_COMMUNITY)
Admission: RE | Admit: 2019-03-30 | Discharge: 2019-03-30 | Disposition: A | Discharge status: Home / Self Care | Payer: PPO | Source: Ambulatory Visit | Attending: Vascular Surgery | Admitting: Vascular Surgery

## 2019-03-30 ENCOUNTER — Encounter: Payer: Self-pay | Admitting: Vascular Surgery

## 2019-03-30 ENCOUNTER — Ambulatory Visit (INDEPENDENT_AMBULATORY_CARE_PROVIDER_SITE_OTHER): Payer: PPO | Admitting: Vascular Surgery

## 2019-03-30 ENCOUNTER — Other Ambulatory Visit: Payer: Self-pay

## 2019-03-30 ENCOUNTER — Ambulatory Visit (INDEPENDENT_AMBULATORY_CARE_PROVIDER_SITE_OTHER)
Admission: RE | Admit: 2019-03-30 | Discharge: 2019-03-30 | Disposition: A | Discharge status: Home / Self Care | Payer: PPO | Source: Ambulatory Visit | Attending: Vascular Surgery | Admitting: Vascular Surgery

## 2019-03-30 VITALS — BP 137/78 | HR 77 | Temp 97.9°F | Resp 20 | Ht 71.0 in | Wt 160.0 lb

## 2019-03-30 DIAGNOSIS — L97909 Non-pressure chronic ulcer of unspecified part of unspecified lower leg with unspecified severity: Secondary | ICD-10-CM

## 2019-03-30 DIAGNOSIS — I70299 Other atherosclerosis of native arteries of extremities, unspecified extremity: Secondary | ICD-10-CM | POA: Diagnosis not present

## 2019-03-30 DIAGNOSIS — Z48812 Encounter for surgical aftercare following surgery on the circulatory system: Secondary | ICD-10-CM | POA: Diagnosis not present

## 2019-03-30 DIAGNOSIS — I70291 Other atherosclerosis of native arteries of extremities, right leg: Secondary | ICD-10-CM | POA: Diagnosis not present
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
## 2019-03-31 ENCOUNTER — Other Ambulatory Visit: Payer: Self-pay | Admitting: *Deleted

## 2019-03-31 ENCOUNTER — Inpatient Hospital Stay: Payer: PPO | Admitting: Nurse Practitioner

## 2019-03-31 DIAGNOSIS — L97909 Non-pressure chronic ulcer of unspecified part of unspecified lower leg with unspecified severity: Secondary | ICD-10-CM

## 2019-03-31 DIAGNOSIS — I70299 Other atherosclerosis of native arteries of extremities, unspecified extremity: Secondary | ICD-10-CM

## 2019-04-11 ENCOUNTER — Other Ambulatory Visit: Payer: Self-pay | Admitting: Nurse Practitioner

## 2019-04-22 ENCOUNTER — Other Ambulatory Visit: Payer: Self-pay | Admitting: Nurse Practitioner

## 2019-04-22 DIAGNOSIS — I70235 Atherosclerosis of native arteries of right leg with ulceration of other part of foot: Secondary | ICD-10-CM | POA: Diagnosis not present

## 2019-04-22 DIAGNOSIS — I70243 Atherosclerosis of native arteries of left leg with ulceration of ankle: Secondary | ICD-10-CM | POA: Diagnosis not present

## 2019-04-22 DIAGNOSIS — I70245 Atherosclerosis of native arteries of left leg with ulceration of other part of foot: Secondary | ICD-10-CM | POA: Diagnosis not present

## 2019-04-22 DIAGNOSIS — G8220 Paraplegia, unspecified: Secondary | ICD-10-CM | POA: Diagnosis not present

## 2019-04-22 DIAGNOSIS — I739 Peripheral vascular disease, unspecified: Secondary | ICD-10-CM | POA: Diagnosis not present

## 2019-04-22 DIAGNOSIS — L89899 Pressure ulcer of other site, unspecified stage: Secondary | ICD-10-CM | POA: Diagnosis not present

## 2019-04-22 DIAGNOSIS — I7090 Unspecified atherosclerosis: Secondary | ICD-10-CM | POA: Diagnosis not present

## 2019-04-22 DIAGNOSIS — I70233 Atherosclerosis of native arteries of right leg with ulceration of ankle: Secondary | ICD-10-CM | POA: Diagnosis not present

## 2019-04-29 DIAGNOSIS — I7025 Atherosclerosis of native arteries of other extremities with ulceration: Secondary | ICD-10-CM | POA: Diagnosis not present

## 2019-04-29 DIAGNOSIS — L97519 Non-pressure chronic ulcer of other part of right foot with unspecified severity: Secondary | ICD-10-CM | POA: Diagnosis not present

## 2019-04-29 DIAGNOSIS — L97529 Non-pressure chronic ulcer of other part of left foot with unspecified severity: Secondary | ICD-10-CM | POA: Diagnosis not present

## 2019-05-13 DIAGNOSIS — L97519 Non-pressure chronic ulcer of other part of right foot with unspecified severity: Secondary | ICD-10-CM | POA: Diagnosis not present

## 2019-05-13 DIAGNOSIS — L89519 Pressure ulcer of right ankle, unspecified stage: Secondary | ICD-10-CM | POA: Diagnosis not present

## 2019-05-13 DIAGNOSIS — L89529 Pressure ulcer of left ankle, unspecified stage: Secondary | ICD-10-CM | POA: Diagnosis not present

## 2019-05-13 DIAGNOSIS — I739 Peripheral vascular disease, unspecified: Secondary | ICD-10-CM | POA: Diagnosis not present

## 2019-05-13 DIAGNOSIS — L89899 Pressure ulcer of other site, unspecified stage: Secondary | ICD-10-CM | POA: Diagnosis not present

## 2019-05-24 DIAGNOSIS — N319 Neuromuscular dysfunction of bladder, unspecified: Secondary | ICD-10-CM | POA: Diagnosis not present

## 2019-05-24 DIAGNOSIS — N390 Urinary tract infection, site not specified: Secondary | ICD-10-CM | POA: Diagnosis not present

## 2019-05-24 DIAGNOSIS — R339 Retention of urine, unspecified: Secondary | ICD-10-CM | POA: Diagnosis not present

## 2019-05-27 DIAGNOSIS — L97518 Non-pressure chronic ulcer of other part of right foot with other specified severity: Secondary | ICD-10-CM | POA: Diagnosis not present

## 2019-05-27 DIAGNOSIS — L97328 Non-pressure chronic ulcer of left ankle with other specified severity: Secondary | ICD-10-CM | POA: Diagnosis not present

## 2019-05-27 DIAGNOSIS — L97529 Non-pressure chronic ulcer of other part of left foot with unspecified severity: Secondary | ICD-10-CM | POA: Diagnosis not present

## 2019-05-27 DIAGNOSIS — L97519 Non-pressure chronic ulcer of other part of right foot with unspecified severity: Secondary | ICD-10-CM | POA: Diagnosis not present

## 2019-05-27 DIAGNOSIS — L97528 Non-pressure chronic ulcer of other part of left foot with other specified severity: Secondary | ICD-10-CM | POA: Diagnosis not present

## 2019-05-27 DIAGNOSIS — L97318 Non-pressure chronic ulcer of right ankle with other specified severity: Secondary | ICD-10-CM | POA: Diagnosis not present

## 2019-05-27 DIAGNOSIS — I739 Peripheral vascular disease, unspecified: Secondary | ICD-10-CM | POA: Diagnosis not present

## 2019-05-27 DIAGNOSIS — L97319 Non-pressure chronic ulcer of right ankle with unspecified severity: Secondary | ICD-10-CM | POA: Diagnosis not present

## 2019-05-27 DIAGNOSIS — L97329 Non-pressure chronic ulcer of left ankle with unspecified severity: Secondary | ICD-10-CM | POA: Diagnosis not present

## 2019-05-28 ENCOUNTER — Other Ambulatory Visit: Payer: Self-pay | Admitting: Nurse Practitioner

## 2019-05-28 ENCOUNTER — Other Ambulatory Visit: Payer: Self-pay | Admitting: Family

## 2019-05-28 DIAGNOSIS — I10 Essential (primary) hypertension: Secondary | ICD-10-CM

## 2019-05-29 ENCOUNTER — Other Ambulatory Visit: Payer: Self-pay | Admitting: Physician Assistant

## 2019-06-21 DIAGNOSIS — R972 Elevated prostate specific antigen [PSA]: Secondary | ICD-10-CM | POA: Diagnosis not present

## 2019-06-28 DIAGNOSIS — L97518 Non-pressure chronic ulcer of other part of right foot with other specified severity: Secondary | ICD-10-CM | POA: Diagnosis not present

## 2019-06-28 DIAGNOSIS — L97818 Non-pressure chronic ulcer of other part of right lower leg with other specified severity: Secondary | ICD-10-CM | POA: Diagnosis not present

## 2019-06-28 DIAGNOSIS — L97318 Non-pressure chronic ulcer of right ankle with other specified severity: Secondary | ICD-10-CM | POA: Diagnosis not present

## 2019-06-28 DIAGNOSIS — L97328 Non-pressure chronic ulcer of left ankle with other specified severity: Secondary | ICD-10-CM | POA: Diagnosis not present

## 2019-06-28 DIAGNOSIS — N312 Flaccid neuropathic bladder, not elsewhere classified: Secondary | ICD-10-CM | POA: Diagnosis not present

## 2019-06-28 DIAGNOSIS — S91002A Unspecified open wound, left ankle, initial encounter: Secondary | ICD-10-CM | POA: Diagnosis not present

## 2019-06-28 DIAGNOSIS — L97828 Non-pressure chronic ulcer of other part of left lower leg with other specified severity: Secondary | ICD-10-CM | POA: Diagnosis not present

## 2019-06-28 DIAGNOSIS — R972 Elevated prostate specific antigen [PSA]: Secondary | ICD-10-CM | POA: Diagnosis not present

## 2019-06-28 DIAGNOSIS — Z4800 Encounter for change or removal of nonsurgical wound dressing: Secondary | ICD-10-CM | POA: Diagnosis not present

## 2019-06-28 DIAGNOSIS — N5201 Erectile dysfunction due to arterial insufficiency: Secondary | ICD-10-CM | POA: Diagnosis not present

## 2019-07-12 DIAGNOSIS — I70239 Atherosclerosis of native arteries of right leg with ulceration of unspecified site: Secondary | ICD-10-CM | POA: Diagnosis not present

## 2019-07-12 DIAGNOSIS — T148XXA Other injury of unspecified body region, initial encounter: Secondary | ICD-10-CM | POA: Diagnosis not present

## 2019-07-12 DIAGNOSIS — I70249 Atherosclerosis of native arteries of left leg with ulceration of unspecified site: Secondary | ICD-10-CM | POA: Diagnosis not present

## 2019-07-13 DIAGNOSIS — N319 Neuromuscular dysfunction of bladder, unspecified: Secondary | ICD-10-CM | POA: Diagnosis not present

## 2019-07-13 DIAGNOSIS — N390 Urinary tract infection, site not specified: Secondary | ICD-10-CM | POA: Diagnosis not present

## 2019-07-13 DIAGNOSIS — R339 Retention of urine, unspecified: Secondary | ICD-10-CM | POA: Diagnosis not present

## 2019-07-18 ENCOUNTER — Other Ambulatory Visit: Payer: Self-pay | Admitting: Nurse Practitioner

## 2019-07-26 DIAGNOSIS — G8220 Paraplegia, unspecified: Secondary | ICD-10-CM | POA: Diagnosis not present

## 2019-07-26 DIAGNOSIS — K219 Gastro-esophageal reflux disease without esophagitis: Secondary | ICD-10-CM | POA: Diagnosis not present

## 2019-07-26 DIAGNOSIS — E7800 Pure hypercholesterolemia, unspecified: Secondary | ICD-10-CM | POA: Diagnosis not present

## 2019-07-26 DIAGNOSIS — L97529 Non-pressure chronic ulcer of other part of left foot with unspecified severity: Secondary | ICD-10-CM | POA: Diagnosis not present

## 2019-07-26 DIAGNOSIS — Z7902 Long term (current) use of antithrombotics/antiplatelets: Secondary | ICD-10-CM | POA: Diagnosis not present

## 2019-07-26 DIAGNOSIS — Z87891 Personal history of nicotine dependence: Secondary | ICD-10-CM | POA: Diagnosis not present

## 2019-07-26 DIAGNOSIS — I70243 Atherosclerosis of native arteries of left leg with ulceration of ankle: Secondary | ICD-10-CM | POA: Diagnosis not present

## 2019-07-26 DIAGNOSIS — L97519 Non-pressure chronic ulcer of other part of right foot with unspecified severity: Secondary | ICD-10-CM | POA: Diagnosis not present

## 2019-07-26 DIAGNOSIS — G8929 Other chronic pain: Secondary | ICD-10-CM | POA: Diagnosis not present

## 2019-07-26 DIAGNOSIS — Z79899 Other long term (current) drug therapy: Secondary | ICD-10-CM | POA: Diagnosis not present

## 2019-07-26 DIAGNOSIS — I10 Essential (primary) hypertension: Secondary | ICD-10-CM | POA: Diagnosis not present

## 2019-07-26 DIAGNOSIS — M549 Dorsalgia, unspecified: Secondary | ICD-10-CM | POA: Diagnosis not present

## 2019-07-26 DIAGNOSIS — L97329 Non-pressure chronic ulcer of left ankle with unspecified severity: Secondary | ICD-10-CM | POA: Diagnosis not present

## 2019-07-26 DIAGNOSIS — I739 Peripheral vascular disease, unspecified: Secondary | ICD-10-CM | POA: Diagnosis not present

## 2019-08-07 ENCOUNTER — Other Ambulatory Visit: Payer: Self-pay | Admitting: Nurse Practitioner

## 2019-08-09 DIAGNOSIS — I70243 Atherosclerosis of native arteries of left leg with ulceration of ankle: Secondary | ICD-10-CM | POA: Diagnosis not present

## 2019-08-10 ENCOUNTER — Other Ambulatory Visit: Payer: Self-pay | Admitting: Nurse Practitioner

## 2019-08-10 NOTE — Telephone Encounter (Signed)
08/08/19 refill failed

## 2019-08-16 ENCOUNTER — Telehealth: Payer: Self-pay | Admitting: *Deleted

## 2019-08-16 MED ORDER — TESTOSTERONE CYPIONATE 200 MG/ML IM SOLN: 150.0000 mg | INTRAMUSCULAR | 1 refills | Status: DC

## 2019-08-16 NOTE — Telephone Encounter (Signed)
[facility],  (documented under script that day,  on June 24).  Please resend.

## 2019-08-16 NOTE — Addendum Note (Signed)
Addended by: Chevis Pretty on: 08/16/2019 03:14 PM   Modules accepted: Orders

## 2019-09-07 DIAGNOSIS — N319 Neuromuscular dysfunction of bladder, unspecified: Secondary | ICD-10-CM | POA: Diagnosis not present

## 2019-09-07 DIAGNOSIS — R339 Retention of urine, unspecified: Secondary | ICD-10-CM | POA: Diagnosis not present

## 2019-09-07 DIAGNOSIS — N390 Urinary tract infection, site not specified: Secondary | ICD-10-CM | POA: Diagnosis not present

## 2019-09-13 DIAGNOSIS — L97329 Non-pressure chronic ulcer of left ankle with unspecified severity: Secondary | ICD-10-CM | POA: Diagnosis not present

## 2019-09-13 DIAGNOSIS — L97519 Non-pressure chronic ulcer of other part of right foot with unspecified severity: Secondary | ICD-10-CM | POA: Diagnosis not present

## 2019-09-13 DIAGNOSIS — Z7902 Long term (current) use of antithrombotics/antiplatelets: Secondary | ICD-10-CM | POA: Diagnosis not present

## 2019-09-13 DIAGNOSIS — I10 Essential (primary) hypertension: Secondary | ICD-10-CM | POA: Diagnosis not present

## 2019-09-13 DIAGNOSIS — G8220 Paraplegia, unspecified: Secondary | ICD-10-CM | POA: Diagnosis not present

## 2019-09-13 DIAGNOSIS — S91002D Unspecified open wound, left ankle, subsequent encounter: Secondary | ICD-10-CM | POA: Diagnosis not present

## 2019-09-13 DIAGNOSIS — G8929 Other chronic pain: Secondary | ICD-10-CM | POA: Diagnosis not present

## 2019-09-13 DIAGNOSIS — I739 Peripheral vascular disease, unspecified: Secondary | ICD-10-CM | POA: Diagnosis not present

## 2019-09-13 DIAGNOSIS — Z79899 Other long term (current) drug therapy: Secondary | ICD-10-CM | POA: Diagnosis not present

## 2019-09-13 DIAGNOSIS — E7800 Pure hypercholesterolemia, unspecified: Secondary | ICD-10-CM | POA: Diagnosis not present

## 2019-09-13 DIAGNOSIS — Z87891 Personal history of nicotine dependence: Secondary | ICD-10-CM | POA: Diagnosis not present

## 2019-09-26 ENCOUNTER — Telehealth (INDEPENDENT_AMBULATORY_CARE_PROVIDER_SITE_OTHER): Payer: PPO | Admitting: Family

## 2019-09-26 ENCOUNTER — Encounter: Payer: Self-pay | Admitting: Family

## 2019-09-26 DIAGNOSIS — G8220 Paraplegia, unspecified: Secondary | ICD-10-CM

## 2019-09-26 DIAGNOSIS — R5383 Other fatigue: Secondary | ICD-10-CM

## 2019-09-26 DIAGNOSIS — L989 Disorder of the skin and subcutaneous tissue, unspecified: Secondary | ICD-10-CM | POA: Diagnosis not present

## 2019-09-26 DIAGNOSIS — L89510 Pressure ulcer of right ankle, unstageable: Secondary | ICD-10-CM | POA: Diagnosis not present
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
## 2019-09-26 NOTE — Addendum Note (Signed)
Addended by: Earlene Plater on: 09/26/2019 11:07 AM   Modules accepted: Orders

## 2019-09-27 LAB — CMP14+EGFR
ALT: 14 IU/L (ref 0–44)
AST: 16 IU/L (ref 0–40)
Albumin/Globulin Ratio: 1.2 (ref 1.2–2.2)
Albumin: 3.5 g/dL — ABNORMAL LOW (ref 3.8–4.8)
Alkaline Phosphatase: 104 IU/L (ref 48–121)
BUN/Creatinine Ratio: 21 (ref 10–24)
BUN: 12 mg/dL (ref 8–27)
Bilirubin Total: 0.4 mg/dL (ref 0.0–1.2)
CO2: 23 mmol/L (ref 20–29)
Calcium: 8.8 mg/dL (ref 8.6–10.2)
Chloride: 105 mmol/L (ref 96–106)
Creatinine, Ser: 0.57 mg/dL — ABNORMAL LOW (ref 0.76–1.27)
GFR calc Af Amer: 122 mL/min/{1.73_m2} (ref >59)
GFR calc non Af Amer: 106 mL/min/{1.73_m2} (ref >59)
Globulin, Total: 3 g/dL (ref 1.5–4.5)
Glucose: 106 mg/dL — ABNORMAL HIGH (ref 65–99)
Potassium: 4.2 mmol/L (ref 3.5–5.2)
Sodium: 140 mmol/L (ref 134–144)
Total Protein: 6.5 g/dL (ref 6.0–8.5)

## 2019-09-27 LAB — ANEMIA PROFILE B
Basophils Absolute: 0.1 10*3/uL (ref 0.0–0.2)
Basos: 1 %
EOS (ABSOLUTE): 0.2 10*3/uL (ref 0.0–0.4)
Eos: 2 %
Ferritin: 36 ng/mL (ref 30–400)
Folate: 9 ng/mL (ref >3.0)
Hematocrit: 38.6 % (ref 37.5–51.0)
Hemoglobin: 12.2 g/dL — ABNORMAL LOW (ref 13.0–17.7)
Immature Grans (Abs): 0 10*3/uL (ref 0.0–0.1)
Immature Granulocytes: 0 %
Iron Saturation: 25 % (ref 15–55)
Iron: 67 ug/dL (ref 38–169)
Lymphocytes Absolute: 2.7 10*3/uL (ref 0.7–3.1)
Lymphs: 36 %
MCH: 25.6 pg — ABNORMAL LOW (ref 26.6–33.0)
MCHC: 31.6 g/dL (ref 31.5–35.7)
MCV: 81 fL (ref 79–97)
Monocytes Absolute: 0.5 10*3/uL (ref 0.1–0.9)
Monocytes: 7 %
Neutrophils Absolute: 3.9 10*3/uL (ref 1.4–7.0)
Neutrophils: 54 %
Platelets: 346 10*3/uL (ref 150–450)
RBC: 4.77 x10E6/uL (ref 4.14–5.80)
RDW: 18.3 % — ABNORMAL HIGH (ref 11.6–15.4)
Retic Ct Pct: 1.2 % (ref 0.6–2.6)
Total Iron Binding Capacity: 269 ug/dL (ref 250–450)
UIBC: 202 ug/dL (ref 111–343)
Vitamin B-12: 970 pg/mL (ref 232–1245)
WBC: 7.3 10*3/uL (ref 3.4–10.8)

## 2019-09-27 LAB — TSH: TSH: 1.27 u[IU]/mL (ref 0.450–4.500)

## 2019-10-04 DIAGNOSIS — L97929 Non-pressure chronic ulcer of unspecified part of left lower leg with unspecified severity: Secondary | ICD-10-CM | POA: Diagnosis not present

## 2019-10-04 DIAGNOSIS — I70201 Unspecified atherosclerosis of native arteries of extremities, right leg: Secondary | ICD-10-CM | POA: Diagnosis not present

## 2019-10-04 DIAGNOSIS — I70233 Atherosclerosis of native arteries of right leg with ulceration of ankle: Secondary | ICD-10-CM | POA: Diagnosis not present

## 2019-10-04 DIAGNOSIS — I70243 Atherosclerosis of native arteries of left leg with ulceration of ankle: Secondary | ICD-10-CM | POA: Diagnosis not present

## 2019-10-04 DIAGNOSIS — G8220 Paraplegia, unspecified: Secondary | ICD-10-CM | POA: Diagnosis not present

## 2019-10-04 DIAGNOSIS — I10 Essential (primary) hypertension: Secondary | ICD-10-CM | POA: Diagnosis not present

## 2019-10-04 DIAGNOSIS — L89159 Pressure ulcer of sacral region, unspecified stage: Secondary | ICD-10-CM | POA: Diagnosis not present

## 2019-10-04 DIAGNOSIS — Z7902 Long term (current) use of antithrombotics/antiplatelets: Secondary | ICD-10-CM | POA: Diagnosis not present

## 2019-10-04 DIAGNOSIS — L89152 Pressure ulcer of sacral region, stage 2: Secondary | ICD-10-CM | POA: Diagnosis not present

## 2019-10-04 DIAGNOSIS — E7800 Pure hypercholesterolemia, unspecified: Secondary | ICD-10-CM | POA: Diagnosis not present

## 2019-10-04 DIAGNOSIS — L89510 Pressure ulcer of right ankle, unstageable: Secondary | ICD-10-CM | POA: Diagnosis not present

## 2019-10-04 DIAGNOSIS — Z87891 Personal history of nicotine dependence: Secondary | ICD-10-CM | POA: Diagnosis not present

## 2019-10-04 DIAGNOSIS — Z79899 Other long term (current) drug therapy: Secondary | ICD-10-CM | POA: Diagnosis not present

## 2019-10-10 DIAGNOSIS — L918 Other hypertrophic disorders of the skin: Secondary | ICD-10-CM | POA: Diagnosis not present

## 2019-10-10 DIAGNOSIS — C44319 Basal cell carcinoma of skin of other parts of face: Secondary | ICD-10-CM | POA: Diagnosis not present

## 2019-10-10 DIAGNOSIS — D225 Melanocytic nevi of trunk: Secondary | ICD-10-CM | POA: Diagnosis not present

## 2019-10-18 ENCOUNTER — Encounter: Payer: Self-pay | Admitting: Family Medicine

## 2019-10-18 ENCOUNTER — Telehealth (INDEPENDENT_AMBULATORY_CARE_PROVIDER_SITE_OTHER): Payer: PPO | Admitting: Family Medicine

## 2019-10-18 DIAGNOSIS — R509 Fever, unspecified: Secondary | ICD-10-CM

## 2019-10-18 DIAGNOSIS — K2101 Gastro-esophageal reflux disease with esophagitis, with bleeding: Secondary | ICD-10-CM | POA: Diagnosis not present

## 2019-10-18 DIAGNOSIS — K529 Noninfective gastroenteritis and colitis, unspecified: Secondary | ICD-10-CM

## 2019-10-18 MED ORDER — PANTOPRAZOLE SODIUM 40 MG PO TBEC: 40.0000 mg | DELAYED_RELEASE_TABLET | Freq: Every day | ORAL | 1 refills | Status: DC

## 2019-10-18 MED ORDER — PROMETHAZINE HCL 25 MG PO TABS: 25.0000 mg | ORAL_TABLET | Freq: Three times a day (TID) | ORAL | 1 refills | Status: DC | PRN

## 2019-10-18 NOTE — Progress Notes (Signed)
Subjective:    Patient ID: Dustin Medina, male    DOB: [birth year], 69 y.o.   MRN: [id number]   HPI: Dustin Medina is a 69 y.o. male presenting because he  was  throwing up stuff last week. Thinks it was pinto beans. Denies hematemesis.  Hospitalized last March for ulcer and bleeding. He wants a refill. None were available.  Dustin Medina He is also out of refills of his pantoprazole.    Depression screen [place] 2/9 03/28/2019 08/06/2018 03/16/2018 07/16/2017 12/22/2016  Decreased Interest 0 0 0 0 0  Down, Depressed, Hopeless 0 0 0 0 0  PHQ - 2 Score 0 0 0 0 0  Altered sleeping - 0 - - -  Tired, decreased energy - 0 - - -  Change in appetite - 0 - - -  Feeling bad or failure about yourself  - 0 - - -  Trouble concentrating - 0 - - -  Moving slowly or fidgety/restless - 0 - - -  Suicidal thoughts - 0 - - -  PHQ-9 Score - 0 - - -  Some recent data might be hidden     Relevant past medical, surgical, family and social history reviewed and updated as indicated.  Interim medical history since our last visit reviewed. Allergies and medications reviewed and updated.  ROS:  Review of Systems  HENT: Negative.   Gastrointestinal: Negative for abdominal pain, blood in stool, diarrhea, nausea and rectal pain.     Social History   Tobacco Use  Smoking Status Current Every Day Smoker  . Packs/day: 0.10  . Years: 3.00  . Pack years: 0.30  . Types: Cigarettes  Smokeless Tobacco Never Used       Objective:     Wt Readings from Last 3 Encounters:  03/30/19 160 lb (72.6 kg)  12/22/18 178 lb (80.7 kg)  11/19/18 179 lb 10.8 oz (81.5 kg)     Exam deferred. Pt. [place] due to COVID 19. Phone visit performed.   Assessment & Plan:   1. Fever, unspecified   2. Noninfectious gastroenteritis   3. Gastroesophageal reflux disease with esophagitis and hemorrhage     Meds ordered this encounter  Medications  . promethazine (PHENERGAN) 25 MG tablet    Sig: Take 1 tablet (25 mg total) by mouth  every 8 (eight) hours as needed for nausea or vomiting.    Dispense:  20 tablet    Refill:  1  . pantoprazole (PROTONIX) 40 MG tablet    Sig: Take 1 tablet (40 mg total) by mouth daily.    Dispense:  90 tablet    Refill:  1    No orders of the defined types were placed in this encounter.     Diagnoses and all orders for this visit:  Fever, unspecified -     promethazine (PHENERGAN) 25 MG tablet; Take 1 tablet (25 mg total) by mouth every 8 (eight) hours as needed for nausea or vomiting.  Noninfectious gastroenteritis -     promethazine (PHENERGAN) 25 MG tablet; Take 1 tablet (25 mg total) by mouth every 8 (eight) hours as needed for nausea or vomiting.  Gastroesophageal reflux disease with esophagitis and hemorrhage -     pantoprazole (PROTONIX) 40 MG tablet; Take 1 tablet (40 mg total) by mouth daily.    Virtual Visit via telephone Note  I discussed the limitations, risks, security and privacy concerns of performing an evaluation and management service by telephone and the availability of  in person appointments. The patient was identified with two identifiers. Pt.expressed understanding and agreed to proceed. Pt. Is at home. Dr. Livia Snellen is in his office.  Follow Up Instructions:   I discussed the assessment and treatment plan with the patient. The patient was provided an opportunity to ask questions and all were answered. The patient agreed with the plan and demonstrated an understanding of the instructions.   The patient was advised to call back or seek an in-person evaluation if the symptoms worsen or if the condition fails to improve as anticipated.   Total minutes including chart review and phone contact time: 17   Follow up plan: Return if symptoms worsen or fail to improve.  Claretta Fraise, MD [place]

## 2019-10-24 IMAGING — MR MR PROSTATE WO/W CM
56 series · 56 of 56 positions shown · IV contrast (14ml multihance)
Comparison: 12/06/2015

CLINICAL DATA: Elevated PSA with benign biopsy [REDACTED].

EXAM:
MR PROSTATE WITHOUT AND WITH CONTRAST
TECHNIQUE: Multiplanar multisequence MRI images were obtained of the pelvis
centered about the prostate. Pre and post contrast images were
obtained.
CONTRAST:  14mL MULTIHANCE GADOBENATE DIMEGLUMINE 529 MG/ML IV SOLN

[Series 4: T1 · axial · 8.0mm · 1.06mm/px · 1 of 28 slices shown (1 of 2)]
[im 1/28]
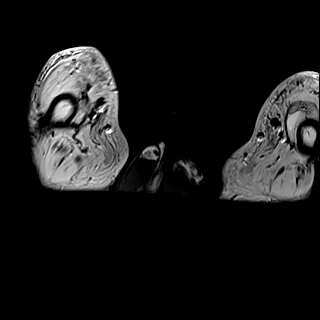

[Series 5: bSSFP fat-sat · axial · 8.0mm · 0.74mm/px · 1 of 28 slices shown]
[im 1/28]
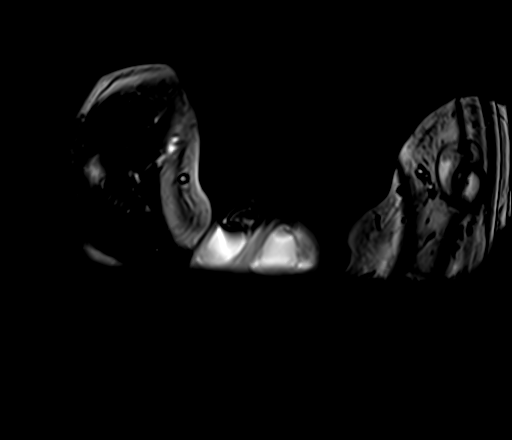

[Series 6: T2 · sagittal · 3.5mm · 0.56mm/px · 1 of 39 slices shown (1 of 4)]
[im 1/39]
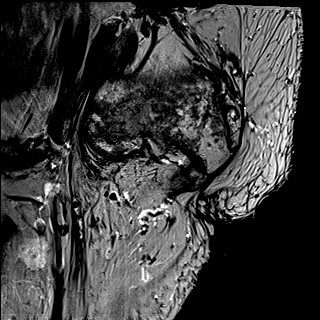

[Series 7: T1 · axial · 3.0mm · 0.31mm/px · 1 of 24 slices shown (2 of 2)]
[im 1/24]
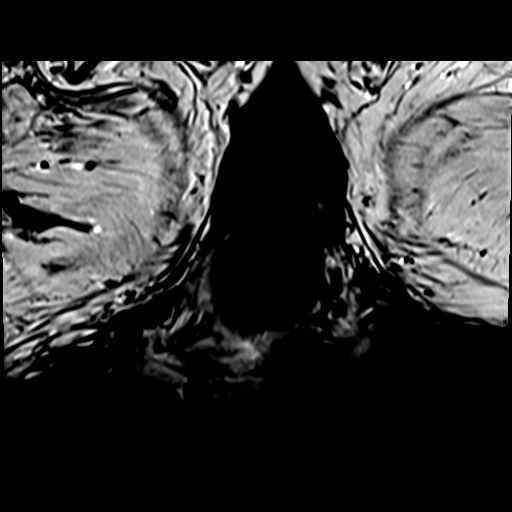

[Series 8: T2 · axial · 3.5mm · 0.56mm/px · 1 of 23 slices shown (2 of 4)]
[im 1/23]
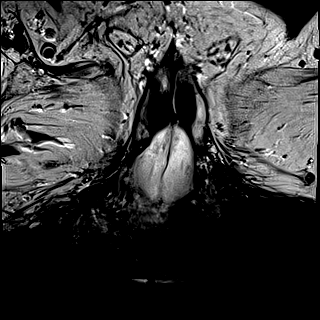

[Series 9: T2 · axial · 1.0mm · 1.04mm/px · 1 of 80 slices shown (3 of 4)]
[im 1/80]
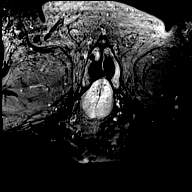

[Series 10: T2 · coronal · 3.5mm · 0.56mm/px · 1 of 23 slices shown (4 of 4)]
[im 1/23]
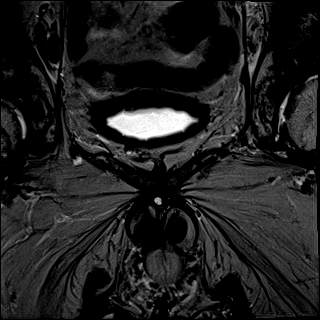

[Series 11: DWI · axial · 3.5mm · 1.56mm/px · 1 of 57 slices shown (1 of 2)]
[im 1/57]
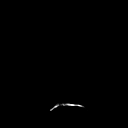

[Series 12: DWI · axial · 3.5mm · 1.56mm/px · 1 of 20 slices shown (2 of 2)]
[im 1/20]
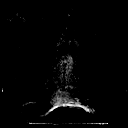

[Series 13: pre t1_twist_tra_dyn_ttc=4.8s · axial · non-contrast · 3.5mm · 0.83mm/px · 1 of 20 slices shown]
[im 1/20]
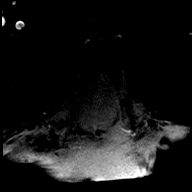

[Series 14: post t1_twist_tra_dyn-copy center · axial · 3.5mm · 0.83mm/px · 1 of 20 slices shown (1 of 24)]
[im 1/20]
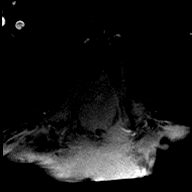

[Series 15: post t1_twist_tra_dyn-copy center · axial · 3.5mm · 0.83mm/px · 1 of 20 slices shown (2 of 24)]
[im 1/20]
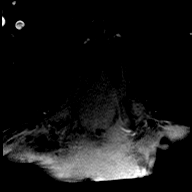

[Series 16: post t1_twist_tra_dyn-copy cent_sub_ttc=(id) · axial · 3.5mm · 0.83mm/px · 1 of 17 slices shown (1 of 22)]
[im 1/17]
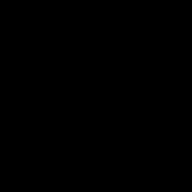

[Series 17: post t1_twist_tra_dyn-copy center · axial · 3.5mm · 0.83mm/px · 1 of 20 slices shown (3 of 24)]
[im 1/20]
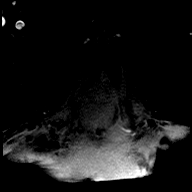

[Series 18: post t1_twist_tra_dyn-copy cent_sub_ttc=(id) · axial · 3.5mm · 0.83mm/px · 1 of 17 slices shown (2 of 22)]
[im 1/17]
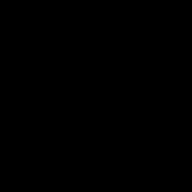

[Series 19: post t1_twist_tra_dyn-copy center · axial · 3.5mm · 0.83mm/px · 1 of 20 slices shown (4 of 24)]
[im 1/20]
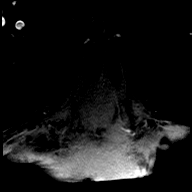

[Series 20: post t1_twist_tra_dyn-copy cent_sub_ttc=(id) · axial · 3.5mm · 0.83mm/px · 1 of 18 slices shown (3 of 22)]
[im 1/18]
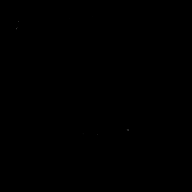

[Series 21: post t1_twist_tra_dyn-copy center · axial · 3.5mm · 0.83mm/px · 1 of 20 slices shown (5 of 24)]
[im 1/20]
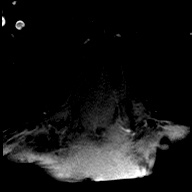

[Series 22: post t1_twist_tra_dyn-copy cent_sub_ttc=(id) · axial · 3.5mm · 0.83mm/px · 1 of 19 slices shown (4 of 22)]
[im 1/19]
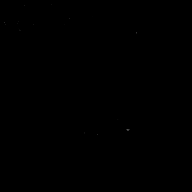

[Series 23: post t1_twist_tra_dyn-copy center · axial · 3.5mm · 0.83mm/px · 1 of 20 slices shown (6 of 24)]
[im 1/20]
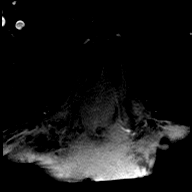

[Series 24: post t1_twist_tra_dyn-copy cent_sub_ttc=(id) · axial · 3.5mm · 0.83mm/px · 1 of 20 slices shown (5 of 22)]
[im 1/20]
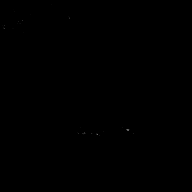

[Series 25: post t1_twist_tra_dyn-copy center · axial · 3.5mm · 0.83mm/px · 1 of 20 slices shown (7 of 24)]
[im 1/20]
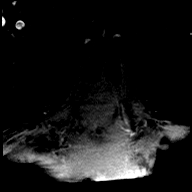

[Series 26: post t1_twist_tra_dyn-copy cent_sub_ttc=(id) · axial · 3.5mm · 0.83mm/px · 1 of 19 slices shown (6 of 22)]
[im 1/19]
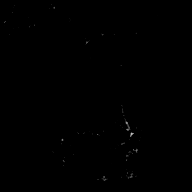

[Series 27: post t1_twist_tra_dyn-copy center · axial · 3.5mm · 0.83mm/px · 1 of 20 slices shown (8 of 24)]
[im 1/20]
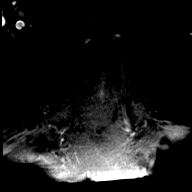

[Series 28: post t1_twist_tra_dyn-copy cent_sub_ttc=(id) · axial · 3.5mm · 0.83mm/px · 1 of 20 slices shown (7 of 22)]
[im 1/20]
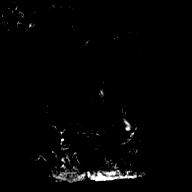

[Series 29: post t1_twist_tra_dyn-copy center · axial · 3.5mm · 0.83mm/px · 1 of 20 slices shown (9 of 24)]
[im 1/20]
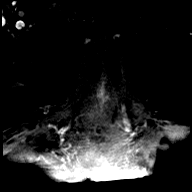

[Series 30: post t1_twist_tra_dyn-copy cent_sub_ttc=(id) · axial · 3.5mm · 0.83mm/px · 1 of 20 slices shown (8 of 22)]
[im 1/20]
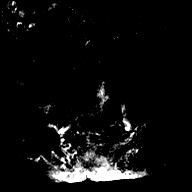

[Series 31: post t1_twist_tra_dyn-copy center · axial · 3.5mm · 0.83mm/px · 1 of 20 slices shown (10 of 24)]
[im 1/20]
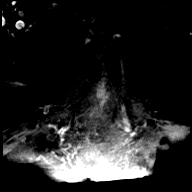

[Series 32: post t1_twist_tra_dyn-copy cent_sub_ttc=(id) · axial · 3.5mm · 0.83mm/px · 1 of 20 slices shown (9 of 22)]
[im 1/20]
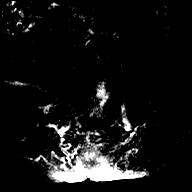

[Series 33: post t1_twist_tra_dyn-copy center · axial · 3.5mm · 0.83mm/px · 1 of 20 slices shown (11 of 24)]
[im 1/20]
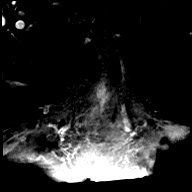

[Series 34: post t1_twist_tra_dyn-copy cent_sub_ttc=(id) · axial · 3.5mm · 0.83mm/px · 1 of 20 slices shown (10 of 22)]
[im 1/20]
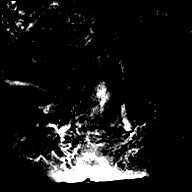

[Series 35: post t1_twist_tra_dyn-copy center · axial · 3.5mm · 0.83mm/px · 1 of 20 slices shown (12 of 24)]
[im 1/20]
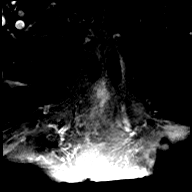

[Series 36: post t1_twist_tra_dyn-copy cent_sub_ttc=(id) · axial · 3.5mm · 0.83mm/px · 1 of 20 slices shown (11 of 22)]
[im 1/20]
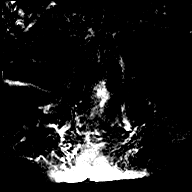

[Series 37: post t1_twist_tra_dyn-copy center · axial · 3.5mm · 0.83mm/px · 1 of 20 slices shown (13 of 24)]
[im 1/20]
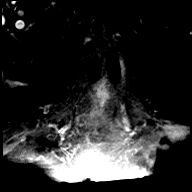

[Series 38: post t1_twist_tra_dyn-copy cent_sub_ttc=(id) · axial · 3.5mm · 0.83mm/px · 1 of 20 slices shown (12 of 22)]
[im 1/20]
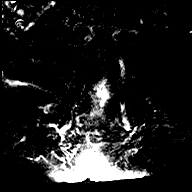

[Series 39: post t1_twist_tra_dyn-copy center · axial · 3.5mm · 0.83mm/px · 1 of 20 slices shown (14 of 24)]
[im 1/20]
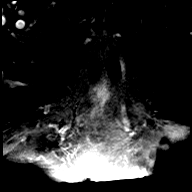

[Series 40: post t1_twist_tra_dyn-copy cent_sub_ttc=(id) · axial · 3.5mm · 0.83mm/px · 1 of 20 slices shown (13 of 22)]
[im 1/20]
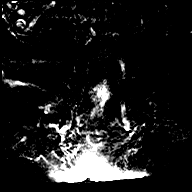

[Series 41: post t1_twist_tra_dyn-copy center · axial · 3.5mm · 0.83mm/px · 1 of 20 slices shown (15 of 24)]
[im 1/20]
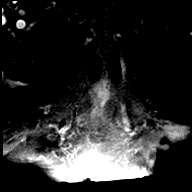

[Series 42: post t1_twist_tra_dyn-copy cent_sub_ttc=(id) · axial · 3.5mm · 0.83mm/px · 1 of 20 slices shown (14 of 22)]
[im 1/20]
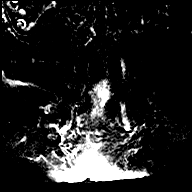

[Series 43: post t1_twist_tra_dyn-copy center · axial · 3.5mm · 0.83mm/px · 1 of 20 slices shown (16 of 24)]
[im 1/20]
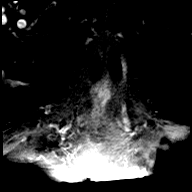

[Series 44: post t1_twist_tra_dyn-copy cent_sub_ttc=(id) · axial · 3.5mm · 0.83mm/px · 1 of 20 slices shown (15 of 22)]
[im 1/20]
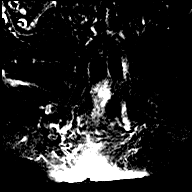

[Series 45: post t1_twist_tra_dyn-copy center · axial · 3.5mm · 0.83mm/px · 1 of 20 slices shown (17 of 24)]
[im 1/20]
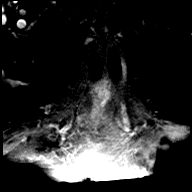

[Series 46: post t1_twist_tra_dyn-copy cent_sub_ttc=(id) · axial · 3.5mm · 0.83mm/px · 1 of 20 slices shown (16 of 22)]
[im 1/20]
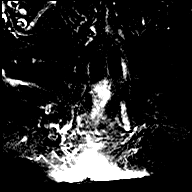

[Series 47: post t1_twist_tra_dyn-copy center · axial · 3.5mm · 0.83mm/px · 1 of 20 slices shown (18 of 24)]
[im 1/20]
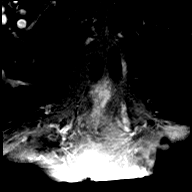

[Series 48: post t1_twist_tra_dyn-copy cent_sub_ttc=(id) · axial · 3.5mm · 0.83mm/px · 1 of 20 slices shown (17 of 22)]
[im 1/20]
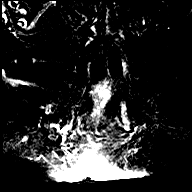

[Series 49: post t1_twist_tra_dyn-copy center · axial · 3.5mm · 0.83mm/px · 1 of 20 slices shown (19 of 24)]
[im 1/20]
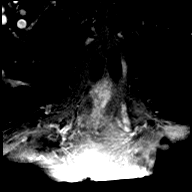

[Series 50: post t1_twist_tra_dyn-copy cent_sub_ttc=(id) · axial · 3.5mm · 0.83mm/px · 1 of 20 slices shown (18 of 22)]
[im 1/20]
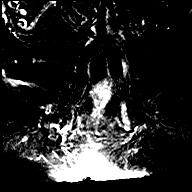

[Series 51: post t1_twist_tra_dyn-copy center · axial · 3.5mm · 0.83mm/px · 1 of 20 slices shown (20 of 24)]
[im 1/20]
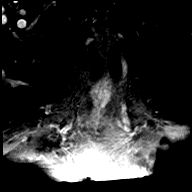

[Series 52: post t1_twist_tra_dyn-copy cent_sub_ttc=(id) · axial · 3.5mm · 0.83mm/px · 1 of 20 slices shown (19 of 22)]
[im 1/20]
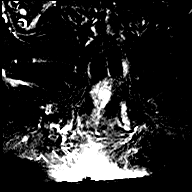

[Series 53: post t1_twist_tra_dyn-copy center · axial · 3.5mm · 0.83mm/px · 1 of 20 slices shown (21 of 24)]
[im 1/20]
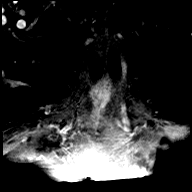

[Series 54: post t1_twist_tra_dyn-copy cent_sub_ttc=(id) · axial · 3.5mm · 0.83mm/px · 1 of 20 slices shown (20 of 22)]
[im 1/20]
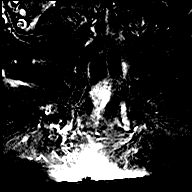

[Series 55: post t1_twist_tra_dyn-copy center · axial · 3.5mm · 0.83mm/px · 1 of 20 slices shown (22 of 24)]
[im 1/20]
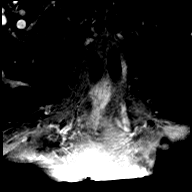

[Series 56: post t1_twist_tra_dyn-copy cent_sub_ttc=(id) · axial · 3.5mm · 0.83mm/px · 1 of 20 slices shown (21 of 22)]
[im 1/20]
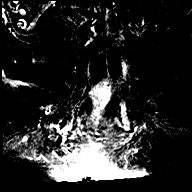

[Series 57: post t1_twist_tra_dyn-copy center · axial · 3.5mm · 0.83mm/px · 1 of 20 slices shown (23 of 24)]
[im 1/20]
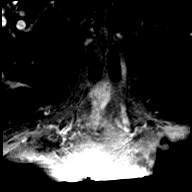

[Series 58: post t1_twist_tra_dyn-copy cent_sub_ttc=(id) · axial · 3.5mm · 0.83mm/px · 1 of 20 slices shown (22 of 22)]
[im 1/20]
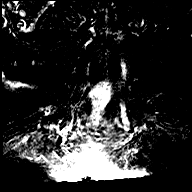

[Series 59: post t1_twist_tra_dyn-copy center · axial · 3.5mm · 0.83mm/px · 1 of 20 slices shown (24 of 24)]
[im 1/20]
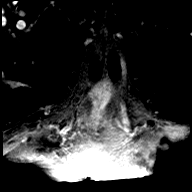

[56 of 56 positions shown; findings below may reference images not displayed]

FINDINGS: Prostate: Demonstrates relatively mild for age central gland
enlargement and heterogeneity, consistent with benign prostatic
hyperplasia.

No dominant central gland nodule.

Diffuse heterogeneously T2 hypointense signal throughout the
peripheral zone. No areas of restricted diffusion or masslike T2
hypointensity. Relatively diffuse, slightly apical predominant
amorphous early post-contrast enhancement throughout the peripheral
zone, including on series 28.

Volume: 4.9 x 3.8 x 4.3 cm= 41.6 cc

Transcapsular spread:  Absent

Seminal vesicle involvement: Absent

Neurovascular bundle involvement: Absent

Pelvic adenopathy: Present. Right external iliac node measures
cm on [DATE] versus 1.0 cm on the prior exam (when remeasured).

Index left external iliac node measures 1.6 x 2.6 cm on [DATE] today
versus 1.4 x 2.4 cm on the prior exam (when remeasured).

Bone metastasis: Absent

Other findings: No significant free fluid. Mild bladder wall
thickening is accentuated by underdistention and suggests a
component of outlet obstruction.
IMPRESSION: 1. No evidence of high-grade or macroscopic prostate carcinoma. T2
heterogeneous hypointensity and early post-contrast enhancement
throughout the peripheral zone can be seen with prostatitis.
2. Progressive pelvic adenopathy compared to the prior of
12/06/2015. Cannot exclude indolent lymphoproliferative process such
as lymphoma. Consider further evaluation with chest, abdomen and
pelvic CT versus PET.
3. Bladder wall thickening, suggesting a component of outlet
obstruction.

## 2019-10-25 DIAGNOSIS — R339 Retention of urine, unspecified: Secondary | ICD-10-CM | POA: Diagnosis not present

## 2019-10-25 DIAGNOSIS — N390 Urinary tract infection, site not specified: Secondary | ICD-10-CM | POA: Diagnosis not present

## 2019-10-25 DIAGNOSIS — N319 Neuromuscular dysfunction of bladder, unspecified: Secondary | ICD-10-CM | POA: Diagnosis not present

## 2019-10-26 ENCOUNTER — Other Ambulatory Visit: Payer: Self-pay

## 2019-10-26 ENCOUNTER — Ambulatory Visit (INDEPENDENT_AMBULATORY_CARE_PROVIDER_SITE_OTHER): Payer: PPO | Admitting: Nurse Practitioner

## 2019-10-26 ENCOUNTER — Encounter: Payer: Self-pay | Admitting: Nurse Practitioner

## 2019-10-26 VITALS — BP 152/96 | HR 75 | Temp 98.2°F | Resp 20

## 2019-10-26 DIAGNOSIS — F17200 Nicotine dependence, unspecified, uncomplicated: Secondary | ICD-10-CM | POA: Diagnosis not present

## 2019-10-26 DIAGNOSIS — G8220 Paraplegia, unspecified: Secondary | ICD-10-CM | POA: Diagnosis not present

## 2019-10-26 DIAGNOSIS — E785 Hyperlipidemia, unspecified: Secondary | ICD-10-CM

## 2019-10-26 DIAGNOSIS — I10 Essential (primary) hypertension: Secondary | ICD-10-CM | POA: Diagnosis not present

## 2019-10-26 DIAGNOSIS — L89510 Pressure ulcer of right ankle, unstageable: Secondary | ICD-10-CM | POA: Diagnosis not present

## 2019-10-26 DIAGNOSIS — K219 Gastro-esophageal reflux disease without esophagitis: Secondary | ICD-10-CM | POA: Diagnosis not present

## 2019-10-26 DIAGNOSIS — I739 Peripheral vascular disease, unspecified: Secondary | ICD-10-CM

## 2019-10-26 DIAGNOSIS — Z125 Encounter for screening for malignant neoplasm of prostate: Secondary | ICD-10-CM | POA: Diagnosis not present

## 2019-10-26 MED ORDER — NIACIN ER (ANTIHYPERLIPIDEMIC) 1000 MG PO TBCR: 1000.0000 mg | EXTENDED_RELEASE_TABLET | Freq: Every day | ORAL | 1 refills | Status: DC

## 2019-10-26 MED ORDER — LOSARTAN POTASSIUM 100 MG PO TABS: ORAL_TABLET | ORAL | 1 refills | Status: DC

## 2019-10-26 MED ORDER — HYDROCHLOROTHIAZIDE 25 MG PO TABS: 25.0000 mg | ORAL_TABLET | Freq: Every day | ORAL | 1 refills | Status: DC

## 2019-10-26 MED ORDER — ATORVASTATIN CALCIUM 10 MG PO TABS: 10.0000 mg | ORAL_TABLET | Freq: Every day | ORAL | 1 refills | Status: DC

## 2019-10-26 NOTE — Patient Instructions (Signed)
Preventing Pressure Injuries  A pressure injury, sometimes called a bedsore or a pressure ulcer, is an injury to the skin and underlying tissue caused by pressure. A pressure injury can happen when your skin presses against a surface, such as a mattress or wheelchair seat, for too long. The pressure on the blood vessels causes reduced blood flow to your skin. This can eventually cause the skin tissue to die and break down into a wound. Pressure injuries usually develop:  Over bony parts of the body, such as the tailbone, shoulders, elbows, hips, and heels.  Under medical devices, such as respiratory equipment, stockings, tubes, and splints. How can this condition affect me? Pressure injuries are caused by a lack of blood supply to an area of skin. These injuries begin as a reddened area on the skin and can become an open sore. They can result from intense pressure over a short period of time or from less pressure over a long period of time. Pressure injuries can vary in severity. They can cause pain, muscle damage, and infection. What can increase my risk? This condition is more likely to develop in people who:  Are in the hospital or an extended care facility.  Are bedridden or in a wheelchair.  Have an injury or disease that keeps them from: ? Moving normally. ? Feeling pain or pressure. ? Communicating if they feel pain or pressure.  Have a condition that: ? Makes them sleepy or less alert. ? Causes poor blood flow.  Need to wear a medical device.  Have poor control of their bladder or bowel functions (incontinence).  Have poor nutrition (malnutrition).  Have had this condition before.  Are of certain ethnicities. People of African American, Latino, or Hispanic descent are at higher risk compared to other ethnic groups. What actions can I take to prevent pressure injuries? Reducing and redistributing pressure  Do not lie or sit in one position for a long time. Move or change  position: ? Every hour when out of bed in a chair. ? Every two hours when in bed. ? As often as told by your health care provider.  Use pillows, wedges, or cushions to redistribute pressure. Ask your health care provider to recommend a mattress, cushions, or pads for you.  Use medical devices that do not rub your skin. Tell your health care provider if one of your medical devices is causing pain or irritation. Skin care If you are in the hospital, your health care providers:  Will inspect your skin, including areas under or around medical devices, at least twice a day.  May recommend that you use certain types of bedding to help prevent pressure injuries. These may include a pad, mattress, or chair cushion that is filled with gel, air, water, or foam.  Will evaluate your nutrition and consult a dietitian if needed.  Will inspect and change any wound dressings regularly.  May help you move into different positions every few hours.  Will adjust any medical devices and braces as needed to limit pressure on your skin.  Will keep your skin clean and dry.  May use gentle cleansers and skin protectants if you are incontinent.  Will moisturize any dry skin. In general, at home:  Keep your skin clean and dry. Gently pat your skin dry.  Do not rub or massage bony areas of your skin.  Moisturize dry skin.  Use gentle cleansers and skin protectants routinely if you are incontinent.  Check your skin at least once   a day for any changes in color and for any new blisters or sores. Make sure to check under and around any medical devices and between skin folds. Have a caregiver do this for you if you are not able.  Lifestyle  Be as active as you can every day. Ask your health care provider to suggest safe exercises or activities.  Do not abuse drugs or alcohol.  Do not use any products that contain nicotine or tobacco, such as cigarettes, e-cigarettes, and chewing tobacco. If you need  help quitting, ask your health care provider. General instructions   Take over-the-counter and prescription medicines only as told by your health care provider.  Work with your health care provider to manage any chronic health conditions.  Eat a healthy diet that includes protein, vitamins, and minerals. Ask your health care provider what types of food you should eat.  Drink enough fluid to keep your urine pale yellow.  Keep all follow-up visits as told by your health care provider. This is important. Contact a health care provider if you:  Feel or see any changes in your skin. Summary  A pressure injury, sometimes called a bedsore or a pressure ulcer, is an injury to the skin and underlying tissue caused by pressure.  Do not lie or sit in one position for a long time.  Check your skin at least once a day for any changes in color and for any new blisters or sores.  Make sure to check under and around any medical devices and between skin folds. Have a caregiver do this for you if you are not able.  Eat a healthy diet that includes protein, vitamins, and minerals. Ask your health care provider what types of food you should eat. This information is not intended to replace advice given to you by your health care provider. Make sure you discuss any questions you have with your health care provider. Document Revised: 05/28/2018 Document Reviewed: 10/27/2017 [employer] Patient Education  2020 [employer]  

## 2019-10-26 NOTE — Addendum Note (Signed)
Addended by: Chevis Pretty on: 10/26/2019 03:30 PM   Modules accepted: Orders

## 2019-10-26 NOTE — Progress Notes (Addendum)
Subjective:    Patient ID: Dustin Medina, male    DOB: May 30, 1950, 69 y.o.   MRN: [id number]   Chief Complaint: medical management of chronic issues     HPI:  1. Essential hypertension Does not check BP at home. Does have headaches sometimes when sitting too long and has to lie down and use the tens unit. This is not a new occurrence but it has gotten a little worse lately. Denies chest pain, sob, dizziness. Does watch sodium intake. BP Readings from Last 3 Encounters:  03/30/19 137/78  03/28/19 (!) 146/81  12/22/18 133/79    2. PVD (peripheral vascular disease) [facility]) Sees vascular surgeon. Next visit is later this month. Reports his peripheral blood flow has gotten better.  3. Gastroesophageal reflux disease, unspecified whether esophagitis present Takes protonix and is doing well. Says he does have reflux symptoms if he eats tomato products.  4. Paraplegic spinal paralysis ([place]) Pt is in a wheelchair. Is on plavix prophylactically.  5. Pressure ulcer of ankle, right, unstageable (H[facility]) Sees wound care for pressure ulcers on buttocks and outside of both ankles. The wounds on his ankles are healing. The buttocks area they are still working on it. He is not sure how much better it looks.  6. Hyperlipidemia with target LDL less than 100 Does watch fat and cholesterol in diet. Has been doing some exercise. Lab Results  Component Value Date   CHOL 160 08/02/2015   HDL 30 (L) 08/02/2015   LDLCALC 81 08/02/2015   TRIG 247 (H) 08/02/2015   CHOLHDL 5.3 (H) 08/02/2015    7. Tobacco smoker within last 12 months Pt has stopped smoking. Has been about a month and a half since he smoked.    Outpatient Encounter Medications as of 10/26/2019  Medication Sig  . atorvastatin (LIPITOR) 10 MG tablet Take 1 tablet (10 mg total) by mouth daily at 6 PM. (Patient not taking: Reported on 03/28/2019)  . clopidogrel (PLAVIX) 75 MG tablet TAKE 1 TABLET (75 MG TOTAL) BY MOUTH DAILY WITH BREAKFAST.   Marland Kitchen ibuprofen (ADVIL) 200 MG tablet Take 200 mg by mouth every 6 (six) hours as needed for headache or moderate pain.  Marland Kitchen losartan (COZAAR) 100 MG tablet TAKE ONE TABLET (100 MG DOSE) BY MOUTH DAILY  . Multiple Vitamin (MULITIVITAMIN WITH MINERALS) TABS Take 1 tablet by mouth every other day.   . Nerve Stimulator (PRO COMFORT TENS ELECTRODES) MISC Apply 2 patches topically daily as needed.  . Nerve Stimulator (PRO COMFORT TENS UNIT) DEVI Apply 1 Units topically daily as needed.  . niacin (NIASPAN) 1000 MG CR tablet TAKE 1 TABLET (1,000 MG TOTAL) BY MOUTH AT BEDTIME.  . pantoprazole (PROTONIX) 40 MG tablet Take 1 tablet (40 mg total) by mouth daily.  . promethazine (PHENERGAN) 25 MG tablet Take 1 tablet (25 mg total) by mouth every 8 (eight) hours as needed for nausea or vomiting.  . silver sulfADIAZINE (SILVADENE) 1 % cream Apply 1 application topically daily as needed (wound care).   Marland Kitchen testosterone cypionate (DEPOTESTOSTERONE CYPIONATE) 200 MG/ML injection Inject 0.75 mLs (150 mg total) into the muscle every 14 (fourteen) days.  Marland Kitchen trimethoprim (TRIMPEX) 100 MG tablet Take 1 tablet (100 mg total) by mouth every other day.   No facility-administered encounter medications on file as of 10/26/2019.    Past Surgical History:  Procedure Laterality Date  . ABDOMINAL AORTOGRAM W/LOWER EXTREMITY Bilateral 11/19/2018   Procedure: ABDOMINAL AORTOGRAM W/LOWER EXTREMITY;  Surgeon: Angelia Mould, MD;  Location: [place] [facility];  Service: Cardiovascular;  Laterality: Bilateral;  . [place]  . carpal tunnel Right 11/23/13  . coloncoscopy  2008   Dr. Laural Golden: few small diverticula at ascending colon and external hemorrhoids, otherwise normal  . COLONOSCOPY WITH PROPOFOL N/A 04/16/2017   Procedure: COLONOSCOPY WITH PROPOFOL;  Surgeon: Daneil Dolin, MD;  Location: [facility];  Service: Endoscopy;  Laterality: N/A;  11:15am  . dilation of esophagus    . ESOPHAGOGASTRODUODENOSCOPY N/A  07/26/2012   HCW:[id number] Schatzki's ring. Hiatal hernia, likely upper GI bleed secondary to MW tear  . HERNIA REPAIR  02/03/2001   paraplegia and right inguinal hernia  . left leg surgery due to staph infection  2005  . LIPOMA EXCISION  07/07/2011   Procedure: EXCISION LIPOMA;  Surgeon: Imogene Burn. Georgette Dover, MD;  Location: [facility];  Service: General;  Laterality: Right;  . LOWER EXTREMITY ANGIOGRAPHY N/A 07/16/2018   Procedure: LOWER EXTREMITY ANGIOGRAPHY;  Surgeon: Angelia Mould, MD;  Location: [place] [facility];  Service: Cardiovascular;  Laterality: N/A;  . MULTIPLE TOOTH EXTRACTIONS    . PERIPHERAL VASCULAR INTERVENTION  07/16/2018   Procedure: PERIPHERAL VASCULAR INTERVENTION;  Surgeon: Angelia Mould, MD;  Location: [place] [facility];  Service: Cardiovascular;;  right common iliac  . POLYPECTOMY  04/16/2017   Procedure: POLYPECTOMY;  Surgeon: Daneil Dolin, MD;  [facility];  Service: Endoscopy;;  colon  . SURGERY FOR DECUBITUS ULCER      Family History  Problem Relation Age of Onset  . COPD Father   . Heart disease Father   . Aneurysm Father   . Hyperlipidemia Mother   . Hypertension Mother   . Diabetes Sister   . Arrhythmia Daughter   . Stroke Sister   . Colon cancer Maternal Grandfather     New complaints: Lower back is hurting more. Says he feels like it is draining his energy and it makes it difficult for him to get things done.  Social history: Lives alone with his dog.   Controlled substance contract: N/A   Review of Systems  Constitutional: Negative.   HENT: Negative.   Eyes: Negative.   Respiratory: Negative.   Cardiovascular: Negative.   Gastrointestinal: Negative.   Musculoskeletal: Positive for back pain and gait problem (paraplegic).  Skin: Positive for wound (ulcers on buttocks and ankles).  Neurological: Positive for headaches.  Psychiatric/Behavioral: Negative.        Objective:   Physical Exam Vitals and nursing  note reviewed.  Constitutional:      Appearance: Normal appearance.  HENT:     Head: Normocephalic.     Right Ear: Tympanic membrane normal.     Left Ear: Tympanic membrane normal.     Nose: Nose normal.     Mouth/Throat:     Mouth: Mucous membranes are moist.     Pharynx: Oropharynx is clear.  Eyes:     Pupils: Pupils are equal, round, and reactive to light.  Cardiovascular:     Rate and Rhythm: Normal rate and regular rhythm.     Heart sounds: Normal heart sounds.  Pulmonary:     Effort: Pulmonary effort is normal.     Breath sounds: Normal breath sounds.  Abdominal:     General: Bowel sounds are normal.     Palpations: Abdomen is soft.  Musculoskeletal:     Cervical back: Normal range of motion.     Comments: Paralysis of lower extremities In wheel  chair Good equal upper body strength  Skin:    General: Skin is warm and dry.     Findings: Wound (ulcers on buttocks and bilateral ankles) present.  Neurological:     Mental Status: He is alert and oriented to person, place, and time.  Psychiatric:        Mood and Affect: Mood normal.    BP (!) 152/96 (BP Location: Right Arm)   Pulse 75   Temp 98.2 F (36.8 C) (Temporal)   Resp 20   SpO2 95%       Assessment & Plan:  LYSLE YERO comes in today with chief complaint of Medical Management of Chronic Issues   Diagnosis and orders addressed:  1. Essential hypertension Low sodium diet Added HCTZ to meds Keep diary of blood pressure at home. - losartan (COZAAR) 100 MG tablet; TAKE ONE TABLET (100 MG DOSE) BY MOUTH DAILY  Dispense: 90 tablet; Refill: 1  2. PVD (peripheral vascular disease) ([place]) Follow up with vascular surgeon as scheduled.  3. Gastroesophageal reflux disease, unspecified whether esophagitis present Avoid spicy foods Do not eat 2 hours prior to bedtime  4. Paraplegic spinal paralysis ([place])   5. Pressure ulcer of ankle, right, unstageable ([place]) Continue with wound care Change positions  frequently Suggested trying a boppy pillow to sit on.  6. Hyperlipidemia with target LDL less than 100 Low fat diet - niacin (NIASPAN) 1000 MG CR tablet; Take 1 tablet (1,000 mg total) by mouth at bedtime.  Dispense: 90 tablet; Refill: 1 - atorvastatin (LIPITOR) 10 MG tablet; Take 1 tablet (10 mg total) by mouth daily at 6 PM.  Dispense: 90 tablet; Refill: 1  7. Tobacco smoker within last 12 months Encourage to stop    Labs pending Health Maintenance reviewed Diet and exercise encouraged  Follow up plan: 6 months   [place], FNP

## 2019-10-27 LAB — CBC WITH DIFFERENTIAL/PLATELET
Basophils Absolute: 0.1 10*3/uL (ref 0.0–0.2)
Basos: 1 %
EOS (ABSOLUTE): 0.2 10*3/uL (ref 0.0–0.4)
Eos: 2 %
Hematocrit: 41.5 % (ref 37.5–51.0)
Hemoglobin: 13.4 g/dL (ref 13.0–17.7)
Immature Grans (Abs): 0 10*3/uL (ref 0.0–0.1)
Immature Granulocytes: 0 %
Lymphocytes Absolute: 3.9 10*3/uL — ABNORMAL HIGH (ref 0.7–3.1)
Lymphs: 33 %
MCH: 26.9 pg (ref 26.6–33.0)
MCHC: 32.3 g/dL (ref 31.5–35.7)
MCV: 83 fL (ref 79–97)
Monocytes Absolute: 1 10*3/uL — ABNORMAL HIGH (ref 0.1–0.9)
Monocytes: 8 %
Neutrophils Absolute: 6.6 10*3/uL (ref 1.4–7.0)
Neutrophils: 56 %
Platelets: 348 10*3/uL (ref 150–450)
RBC: 4.99 x10E6/uL (ref 4.14–5.80)
RDW: 17 % — ABNORMAL HIGH (ref 11.6–15.4)
WBC: 11.7 10*3/uL — ABNORMAL HIGH (ref 3.4–10.8)

## 2019-10-27 LAB — CMP14+EGFR
ALT: 14 IU/L (ref 0–44)
AST: 13 IU/L (ref 0–40)
Albumin/Globulin Ratio: 1.2 (ref 1.2–2.2)
Albumin: 3.8 g/dL (ref 3.8–4.8)
Alkaline Phosphatase: 103 IU/L (ref 48–121)
BUN/Creatinine Ratio: 19 (ref 10–24)
BUN: 13 mg/dL (ref 8–27)
Bilirubin Total: 0.3 mg/dL (ref 0.0–1.2)
CO2: 27 mmol/L (ref 20–29)
Calcium: 9 mg/dL (ref 8.6–10.2)
Chloride: 103 mmol/L (ref 96–106)
Creatinine, Ser: 0.69 mg/dL — ABNORMAL LOW (ref 0.76–1.27)
GFR calc Af Amer: 113 mL/min/{1.73_m2} (ref >59)
GFR calc non Af Amer: 98 mL/min/{1.73_m2} (ref >59)
Globulin, Total: 3.1 g/dL (ref 1.5–4.5)
Glucose: 84 mg/dL (ref 65–99)
Potassium: 4.5 mmol/L (ref 3.5–5.2)
Sodium: 140 mmol/L (ref 134–144)
Total Protein: 6.9 g/dL (ref 6.0–8.5)

## 2019-10-27 LAB — LIPID PANEL
Chol/HDL Ratio: 5.5 ratio — ABNORMAL HIGH (ref 0.0–5.0)
Cholesterol, Total: 182 mg/dL (ref 100–199)
HDL: 33 mg/dL — ABNORMAL LOW (ref >39)
LDL Chol Calc (NIH): 115 mg/dL — ABNORMAL HIGH (ref 0–99)
Triglycerides: 192 mg/dL — ABNORMAL HIGH (ref 0–149)
VLDL Cholesterol Cal: 34 mg/dL (ref 5–40)

## 2019-10-27 LAB — PSA, TOTAL AND FREE
PSA, Free Pct: 7.4 %
PSA, Free: 0.56 ng/mL
Prostate Specific Ag, Serum: 7.6 ng/mL — ABNORMAL HIGH (ref 0.0–4.0)

## 2019-11-01 ENCOUNTER — Encounter: Payer: Self-pay | Admitting: Physical Medicine and Rehabilitation

## 2019-11-01 DIAGNOSIS — L89512 Pressure ulcer of right ankle, stage 2: Secondary | ICD-10-CM | POA: Diagnosis not present

## 2019-11-01 DIAGNOSIS — Z87891 Personal history of nicotine dependence: Secondary | ICD-10-CM | POA: Diagnosis not present

## 2019-11-01 DIAGNOSIS — Z79899 Other long term (current) drug therapy: Secondary | ICD-10-CM | POA: Diagnosis not present

## 2019-11-01 DIAGNOSIS — Z7902 Long term (current) use of antithrombotics/antiplatelets: Secondary | ICD-10-CM | POA: Diagnosis not present

## 2019-11-01 DIAGNOSIS — G8220 Paraplegia, unspecified: Secondary | ICD-10-CM | POA: Diagnosis not present

## 2019-11-01 DIAGNOSIS — L89153 Pressure ulcer of sacral region, stage 3: Secondary | ICD-10-CM | POA: Diagnosis not present

## 2019-11-01 DIAGNOSIS — L97529 Non-pressure chronic ulcer of other part of left foot with unspecified severity: Secondary | ICD-10-CM | POA: Diagnosis not present

## 2019-11-01 DIAGNOSIS — L89510 Pressure ulcer of right ankle, unstageable: Secondary | ICD-10-CM | POA: Diagnosis not present

## 2019-11-01 DIAGNOSIS — L97519 Non-pressure chronic ulcer of other part of right foot with unspecified severity: Secondary | ICD-10-CM | POA: Diagnosis not present

## 2019-11-01 DIAGNOSIS — I10 Essential (primary) hypertension: Secondary | ICD-10-CM | POA: Diagnosis not present

## 2019-11-01 DIAGNOSIS — L89159 Pressure ulcer of sacral region, unspecified stage: Secondary | ICD-10-CM | POA: Diagnosis not present

## 2019-11-03 ENCOUNTER — Other Ambulatory Visit: Payer: Self-pay | Admitting: Nurse Practitioner

## 2019-11-03 NOTE — Telephone Encounter (Signed)
Not on med list

## 2019-11-09 ENCOUNTER — Ambulatory Visit (INDEPENDENT_AMBULATORY_CARE_PROVIDER_SITE_OTHER): Payer: PPO | Admitting: Vascular Surgery

## 2019-11-09 ENCOUNTER — Ambulatory Visit (INDEPENDENT_AMBULATORY_CARE_PROVIDER_SITE_OTHER)
Admission: RE | Admit: 2019-11-09 | Discharge: 2019-11-09 | Disposition: A | Discharge status: Home / Self Care | Payer: PPO | Source: Ambulatory Visit | Attending: Vascular Surgery | Admitting: Vascular Surgery

## 2019-11-09 ENCOUNTER — Other Ambulatory Visit: Payer: Self-pay

## 2019-11-09 ENCOUNTER — Ambulatory Visit (HOSPITAL_COMMUNITY)
Admission: RE | Admit: 2019-11-09 | Discharge: 2019-11-09 | Disposition: A | Discharge status: Home / Self Care | Payer: PPO | Source: Ambulatory Visit | Attending: Vascular Surgery | Admitting: Vascular Surgery

## 2019-11-09 ENCOUNTER — Encounter: Payer: Self-pay | Admitting: Vascular Surgery

## 2019-11-09 VITALS — BP 157/82 | HR 71 | Temp 97.9°F | Resp 20 | Ht 71.0 in

## 2019-11-09 DIAGNOSIS — L97909 Non-pressure chronic ulcer of unspecified part of unspecified lower leg with unspecified severity: Secondary | ICD-10-CM | POA: Insufficient documentation

## 2019-11-09 DIAGNOSIS — I70299 Other atherosclerosis of native arteries of extremities, unspecified extremity: Secondary | ICD-10-CM
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
## 2019-11-11 ENCOUNTER — Encounter: Payer: Self-pay | Admitting: Physical Medicine and Rehabilitation

## 2019-11-11 ENCOUNTER — Encounter: Payer: PPO | Attending: Physical Medicine and Rehabilitation | Admitting: Physical Medicine and Rehabilitation

## 2019-11-11 ENCOUNTER — Other Ambulatory Visit: Payer: Self-pay

## 2019-11-11 VITALS — BP 164/92 | HR 81 | Temp 98.8°F | Ht 71.0 in | Wt 160.0 lb

## 2019-11-11 DIAGNOSIS — G8220 Paraplegia, unspecified: Secondary | ICD-10-CM

## 2019-11-11 DIAGNOSIS — M25512 Pain in left shoulder: Secondary | ICD-10-CM | POA: Diagnosis not present

## 2019-11-11 DIAGNOSIS — L89153 Pressure ulcer of sacral region, stage 3: Secondary | ICD-10-CM | POA: Insufficient documentation

## 2019-11-11 DIAGNOSIS — L89510 Pressure ulcer of right ankle, unstageable: Secondary | ICD-10-CM | POA: Diagnosis not present

## 2019-11-11 DIAGNOSIS — L89150 Pressure ulcer of sacral region, unstageable: Secondary | ICD-10-CM

## 2019-11-11 DIAGNOSIS — S24104S Unspecified injury at T11-T12 level of thoracic spinal cord, sequela: Secondary | ICD-10-CM | POA: Diagnosis not present

## 2019-11-11 DIAGNOSIS — M25511 Pain in right shoulder: Secondary | ICD-10-CM | POA: Diagnosis not present

## 2019-11-11 MED ORDER — TIZANIDINE HCL 4 MG PO TABS: 2.0000 mg | ORAL_TABLET | Freq: Two times a day (BID) | ORAL | 5 refills | Status: DC
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
## 2019-11-15 DIAGNOSIS — L89153 Pressure ulcer of sacral region, stage 3: Secondary | ICD-10-CM | POA: Diagnosis not present

## 2019-11-15 DIAGNOSIS — L89512 Pressure ulcer of right ankle, stage 2: Secondary | ICD-10-CM | POA: Diagnosis not present

## 2019-11-17 ENCOUNTER — Telehealth: Payer: Self-pay | Admitting: *Deleted

## 2019-11-17 NOTE — Telephone Encounter (Signed)
Mr Frontera called to say the company for his cushion and wheelchair is out of network.  He says his insurance company says [employer] is in network.

## 2019-11-17 NOTE — Telephone Encounter (Signed)
I spoke with Dustin Medina and let him know that we have a carbon copy of the Rx for the chair and cushion, but can not read it to rewrite.  I have let him know that it will have to wait until Dr Dagoberto Ligas comes back into the office Monday 11/21/19 and she can re write the Rx for his wheel chair and cushion and we will fax it to [employer] in [place]. He would also like to get some work done on his braces.  He has new shoes, but needs t straps put in them so that he can wear with his braces. Please advise.

## 2019-12-08 DIAGNOSIS — N390 Urinary tract infection, site not specified: Secondary | ICD-10-CM | POA: Diagnosis not present

## 2019-12-08 DIAGNOSIS — R339 Retention of urine, unspecified: Secondary | ICD-10-CM | POA: Diagnosis not present

## 2019-12-08 DIAGNOSIS — N319 Neuromuscular dysfunction of bladder, unspecified: Secondary | ICD-10-CM | POA: Diagnosis not present

## 2019-12-13 DIAGNOSIS — L89512 Pressure ulcer of right ankle, stage 2: Secondary | ICD-10-CM | POA: Diagnosis not present

## 2019-12-13 DIAGNOSIS — Z87891 Personal history of nicotine dependence: Secondary | ICD-10-CM | POA: Diagnosis not present

## 2019-12-13 DIAGNOSIS — G8220 Paraplegia, unspecified: Secondary | ICD-10-CM | POA: Diagnosis not present

## 2019-12-13 DIAGNOSIS — I739 Peripheral vascular disease, unspecified: Secondary | ICD-10-CM | POA: Diagnosis not present

## 2019-12-13 DIAGNOSIS — I10 Essential (primary) hypertension: Secondary | ICD-10-CM | POA: Diagnosis not present

## 2019-12-13 DIAGNOSIS — L89510 Pressure ulcer of right ankle, unstageable: Secondary | ICD-10-CM | POA: Diagnosis not present

## 2019-12-13 DIAGNOSIS — L97519 Non-pressure chronic ulcer of other part of right foot with unspecified severity: Secondary | ICD-10-CM | POA: Diagnosis not present

## 2019-12-13 DIAGNOSIS — Z7902 Long term (current) use of antithrombotics/antiplatelets: Secondary | ICD-10-CM | POA: Diagnosis not present

## 2019-12-13 DIAGNOSIS — L97529 Non-pressure chronic ulcer of other part of left foot with unspecified severity: Secondary | ICD-10-CM | POA: Diagnosis not present

## 2019-12-16 ENCOUNTER — Ambulatory Visit: Payer: PPO | Admitting: Physical Medicine and Rehabilitation

## 2019-12-23 ENCOUNTER — Encounter: Payer: Self-pay | Admitting: Physical Medicine and Rehabilitation

## 2019-12-23 ENCOUNTER — Encounter: Payer: PPO | Attending: Physical Medicine and Rehabilitation | Admitting: Physical Medicine and Rehabilitation

## 2019-12-23 ENCOUNTER — Other Ambulatory Visit: Payer: Self-pay

## 2019-12-23 VITALS — BP 128/78 | HR 66 | Temp 97.6°F | Ht 71.0 in | Wt 155.0 lb

## 2019-12-23 DIAGNOSIS — L89150 Pressure ulcer of sacral region, unstageable: Secondary | ICD-10-CM | POA: Insufficient documentation

## 2019-12-23 DIAGNOSIS — G894 Chronic pain syndrome: Secondary | ICD-10-CM | POA: Diagnosis not present

## 2019-12-23 DIAGNOSIS — Z993 Dependence on wheelchair: Secondary | ICD-10-CM | POA: Insufficient documentation

## 2019-12-23 DIAGNOSIS — Z5181 Encounter for therapeutic drug level monitoring: Secondary | ICD-10-CM | POA: Insufficient documentation

## 2019-12-23 DIAGNOSIS — G8220 Paraplegia, unspecified: Secondary | ICD-10-CM | POA: Insufficient documentation

## 2019-12-23 DIAGNOSIS — L89510 Pressure ulcer of right ankle, unstageable: Secondary | ICD-10-CM | POA: Diagnosis not present

## 2019-12-23 DIAGNOSIS — Z79891 Long term (current) use of opiate analgesic: Secondary | ICD-10-CM | POA: Diagnosis not present

## 2019-12-23 MED ORDER — TRAMADOL HCL 50 MG PO TABS: 50.0000 mg | ORAL_TABLET | Freq: Four times a day (QID) | ORAL | 0 refills | Status: DC | PRN
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
## 2019-12-23 NOTE — Addendum Note (Signed)
Addended by: Jasmine December T on: 12/23/2019 11:41 AM   Modules accepted: Orders
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
## 2019-12-26 DIAGNOSIS — R972 Elevated prostate specific antigen [PSA]: Secondary | ICD-10-CM | POA: Diagnosis not present

## 2019-12-27 LAB — DRUG TOX MONITOR 1 W/CONF, ORAL FLD
Amphetamines: NEGATIVE ng/mL (ref <10)
Barbiturates: NEGATIVE ng/mL (ref <10)
Benzodiazepines: NEGATIVE ng/mL (ref <0.50)
Buprenorphine: NEGATIVE ng/mL (ref <0.10)
Cocaine: NEGATIVE ng/mL (ref <5.0)
Cotinine: 76.3 ng/mL — ABNORMAL HIGH (ref <5.0)
Fentanyl: NEGATIVE ng/mL (ref <0.10)
Heroin Metabolite: NEGATIVE ng/mL (ref <1.0)
MARIJUANA: NEGATIVE ng/mL (ref <2.5)
MDMA: NEGATIVE ng/mL (ref <10)
Meprobamate: NEGATIVE ng/mL (ref <2.5)
Methadone: NEGATIVE ng/mL (ref <5.0)
Nicotine Metabolite: POSITIVE ng/mL — AB (ref <5.0)
Opiates: NEGATIVE ng/mL (ref <2.5)
Phencyclidine: NEGATIVE ng/mL (ref <10)
Tapentadol: NEGATIVE ng/mL (ref <5.0)
Tramadol: NEGATIVE ng/mL (ref <5.0)
Zolpidem: NEGATIVE ng/mL (ref <5.0)

## 2019-12-27 LAB — DRUG TOX ALC METAB W/CON, ORAL FLD: Alcohol Metabolite: NEGATIVE ng/mL (ref <25)

## 2019-12-30 ENCOUNTER — Telehealth: Payer: Self-pay | Admitting: *Deleted

## 2019-12-30 NOTE — Telephone Encounter (Signed)
Oral swab drug screen was consistent for having no prescribed/unprescribed controlled medications.

## 2020-01-02 DIAGNOSIS — R972 Elevated prostate specific antigen [PSA]: Secondary | ICD-10-CM | POA: Diagnosis not present

## 2020-01-02 DIAGNOSIS — N3020 Other chronic cystitis without hematuria: Secondary | ICD-10-CM | POA: Diagnosis not present

## 2020-01-03 DIAGNOSIS — L89510 Pressure ulcer of right ankle, unstageable: Secondary | ICD-10-CM | POA: Diagnosis not present

## 2020-01-03 DIAGNOSIS — T148XXA Other injury of unspecified body region, initial encounter: Secondary | ICD-10-CM | POA: Diagnosis not present

## 2020-01-03 DIAGNOSIS — L97519 Non-pressure chronic ulcer of other part of right foot with unspecified severity: Secondary | ICD-10-CM | POA: Diagnosis not present

## 2020-01-03 DIAGNOSIS — L97529 Non-pressure chronic ulcer of other part of left foot with unspecified severity: Secondary | ICD-10-CM | POA: Diagnosis not present

## 2020-01-03 DIAGNOSIS — I739 Peripheral vascular disease, unspecified: Secondary | ICD-10-CM | POA: Diagnosis not present

## 2020-01-03 DIAGNOSIS — I10 Essential (primary) hypertension: Secondary | ICD-10-CM | POA: Diagnosis not present

## 2020-01-03 DIAGNOSIS — Z7902 Long term (current) use of antithrombotics/antiplatelets: Secondary | ICD-10-CM | POA: Diagnosis not present

## 2020-01-03 DIAGNOSIS — G8220 Paraplegia, unspecified: Secondary | ICD-10-CM | POA: Diagnosis not present

## 2020-01-03 DIAGNOSIS — K626 Ulcer of anus and rectum: Secondary | ICD-10-CM | POA: Diagnosis not present

## 2020-01-03 DIAGNOSIS — Z79899 Other long term (current) drug therapy: Secondary | ICD-10-CM | POA: Diagnosis not present

## 2020-01-03 DIAGNOSIS — Z87891 Personal history of nicotine dependence: Secondary | ICD-10-CM | POA: Diagnosis not present

## 2020-01-16 ENCOUNTER — Telehealth: Payer: Self-pay

## 2020-01-16 NOTE — Telephone Encounter (Signed)
PTN CALLED AND SAID HE IS HAVING DIFFICULTY GETTING CUSHION BEEN WAITING OVER 2 WEEKS WANTS TO KNOW IF YOU CAN HELP

## 2020-01-19 DIAGNOSIS — N390 Urinary tract infection, site not specified: Secondary | ICD-10-CM | POA: Diagnosis not present

## 2020-01-19 DIAGNOSIS — R339 Retention of urine, unspecified: Secondary | ICD-10-CM | POA: Diagnosis not present

## 2020-01-19 DIAGNOSIS — N319 Neuromuscular dysfunction of bladder, unspecified: Secondary | ICD-10-CM | POA: Diagnosis not present

## 2020-01-19 NOTE — Telephone Encounter (Signed)
I call [employer] and they noted they will call pt immediately- said they had his contact info (since it sounds like pt already had called [employer] and left messages- they were clear they were calling pt when we hung on the phone).  Please verify with pt someone called him.

## 2020-01-30 ENCOUNTER — Other Ambulatory Visit: Payer: Self-pay | Admitting: Physical Medicine and Rehabilitation

## 2020-01-31 DIAGNOSIS — L98491 Non-pressure chronic ulcer of skin of other sites limited to breakdown of skin: Secondary | ICD-10-CM | POA: Diagnosis not present

## 2020-01-31 DIAGNOSIS — Z7902 Long term (current) use of antithrombotics/antiplatelets: Secondary | ICD-10-CM | POA: Diagnosis not present

## 2020-01-31 DIAGNOSIS — L89512 Pressure ulcer of right ankle, stage 2: Secondary | ICD-10-CM | POA: Diagnosis not present

## 2020-01-31 DIAGNOSIS — L97521 Non-pressure chronic ulcer of other part of left foot limited to breakdown of skin: Secondary | ICD-10-CM | POA: Diagnosis not present

## 2020-01-31 DIAGNOSIS — I10 Essential (primary) hypertension: Secondary | ICD-10-CM | POA: Diagnosis not present

## 2020-01-31 DIAGNOSIS — Z79899 Other long term (current) drug therapy: Secondary | ICD-10-CM | POA: Diagnosis not present

## 2020-01-31 DIAGNOSIS — G8220 Paraplegia, unspecified: Secondary | ICD-10-CM | POA: Diagnosis not present

## 2020-01-31 DIAGNOSIS — I739 Peripheral vascular disease, unspecified: Secondary | ICD-10-CM | POA: Diagnosis not present

## 2020-01-31 DIAGNOSIS — Z87891 Personal history of nicotine dependence: Secondary | ICD-10-CM | POA: Diagnosis not present

## 2020-01-31 DIAGNOSIS — L97519 Non-pressure chronic ulcer of other part of right foot with unspecified severity: Secondary | ICD-10-CM | POA: Diagnosis not present

## 2020-01-31 DIAGNOSIS — L97529 Non-pressure chronic ulcer of other part of left foot with unspecified severity: Secondary | ICD-10-CM | POA: Diagnosis not present

## 2020-01-31 DIAGNOSIS — L89510 Pressure ulcer of right ankle, unstageable: Secondary | ICD-10-CM | POA: Diagnosis not present

## 2020-01-31 DIAGNOSIS — E7800 Pure hypercholesterolemia, unspecified: Secondary | ICD-10-CM | POA: Diagnosis not present

## 2020-01-31 DIAGNOSIS — L89159 Pressure ulcer of sacral region, unspecified stage: Secondary | ICD-10-CM | POA: Diagnosis not present

## 2020-01-31 DIAGNOSIS — L97511 Non-pressure chronic ulcer of other part of right foot limited to breakdown of skin: Secondary | ICD-10-CM | POA: Diagnosis not present

## 2020-01-31 DIAGNOSIS — L89153 Pressure ulcer of sacral region, stage 3: Secondary | ICD-10-CM | POA: Diagnosis not present

## 2020-02-06 ENCOUNTER — Ambulatory Visit (INDEPENDENT_AMBULATORY_CARE_PROVIDER_SITE_OTHER): Payer: PPO | Admitting: Nurse Practitioner

## 2020-02-06 ENCOUNTER — Encounter: Payer: Self-pay | Admitting: Nurse Practitioner

## 2020-02-06 ENCOUNTER — Other Ambulatory Visit: Payer: Self-pay

## 2020-02-06 VITALS — BP 160/85 | HR 75 | Temp 97.1°F | Resp 20

## 2020-02-06 DIAGNOSIS — L989 Disorder of the skin and subcutaneous tissue, unspecified: Secondary | ICD-10-CM | POA: Diagnosis not present

## 2020-02-06 DIAGNOSIS — F17200 Nicotine dependence, unspecified, uncomplicated: Secondary | ICD-10-CM

## 2020-02-06 DIAGNOSIS — R7989 Other specified abnormal findings of blood chemistry: Secondary | ICD-10-CM

## 2020-02-06 DIAGNOSIS — K219 Gastro-esophageal reflux disease without esophagitis: Secondary | ICD-10-CM | POA: Diagnosis not present

## 2020-02-06 DIAGNOSIS — G8220 Paraplegia, unspecified: Secondary | ICD-10-CM

## 2020-02-06 DIAGNOSIS — Z993 Dependence on wheelchair: Secondary | ICD-10-CM

## 2020-02-06 DIAGNOSIS — I10 Essential (primary) hypertension: Secondary | ICD-10-CM

## 2020-02-06 DIAGNOSIS — E785 Hyperlipidemia, unspecified: Secondary | ICD-10-CM

## 2020-02-06 DIAGNOSIS — K2101 Gastro-esophageal reflux disease with esophagitis, with bleeding: Secondary | ICD-10-CM | POA: Diagnosis not present

## 2020-02-06 MED ORDER — NIACIN ER (ANTIHYPERLIPIDEMIC) 1000 MG PO TBCR: 1000.0000 mg | EXTENDED_RELEASE_TABLET | Freq: Every day | ORAL | 1 refills | Status: DC

## 2020-02-06 MED ORDER — LOSARTAN POTASSIUM 100 MG PO TABS: ORAL_TABLET | ORAL | 1 refills | Status: DC

## 2020-02-06 MED ORDER — TESTOSTERONE CYPIONATE 200 MG/ML IM SOLN: 150.0000 mg | INTRAMUSCULAR | 1 refills | Status: DC

## 2020-02-06 MED ORDER — PANTOPRAZOLE SODIUM 40 MG PO TBEC: 40.0000 mg | DELAYED_RELEASE_TABLET | Freq: Every day | ORAL | 1 refills | Status: DC

## 2020-02-06 MED ORDER — HYDROCHLOROTHIAZIDE 25 MG PO TABS: 25.0000 mg | ORAL_TABLET | Freq: Every day | ORAL | 1 refills | Status: DC

## 2020-02-06 MED ORDER — ATORVASTATIN CALCIUM 10 MG PO TABS: 10.0000 mg | ORAL_TABLET | Freq: Every day | ORAL | 1 refills | Status: DC

## 2020-02-06 MED ORDER — CLOPIDOGREL BISULFATE 75 MG PO TABS: 75.0000 mg | ORAL_TABLET | Freq: Every day | ORAL | 2 refills | Status: DC

## 2020-02-06 NOTE — Progress Notes (Signed)
Subjective:    Patient ID: Dustin Medina, male    DOB: [birth year], 69 y.o.   MRN: [id number]   Chief Complaint: Medical Management of Chronic Issues    HPI:  1. Essential hypertension No c/o chest pain, sob or headche. Does not chck blood pressure at home. BP Readings from Last 3 Encounters:  02/06/20 (!) 160/85  12/23/19 128/78  11/11/19 (!) 164/92      2. Hyperlipidemia with target LDL less than 100 Does not watch diet and is not able to do any exercise Lab Results  Component Value Date   CHOL 182 10/26/2019   HDL 33 (L) 10/26/2019   LDLCALC 115 (H) 10/26/2019   TRIG 192 (H) 10/26/2019   CHOLHDL 5.5 (H) 10/26/2019      3. Gastroesophageal reflux disease, unspecified whether esophagitis present Takes protonix daily which works well for his stomach.  4. Wheelchair dependence Still has pressure ulcer on buttocks are. He I going to wound center for treatment.  5. Paraplegic spinal paralysis ([place]) Is trying to get a new cushion and a new chair. Pain management is helping him with this.  6. Tobacco smoker within last 12 months Still smoking over a pack a day  7.  Low testosterone  Is on testosterone    Outpatient Encounter Medications as of 02/06/2020  Medication Sig  . atorvastatin (LIPITOR) 10 MG tablet Take 1 tablet (10 mg total) by mouth daily at 6 PM.  . clopidogrel (PLAVIX) 75 MG tablet TAKE 1 TABLET (75 MG TOTAL) BY MOUTH DAILY WITH BREAKFAST.  . hydrochlorothiazide (HYDRODIURIL) 25 MG tablet Take 1 tablet (25 mg total) by mouth daily.  Marland Kitchen ibuprofen (ADVIL) 200 MG tablet Take 200 mg by mouth every 6 (six) hours as needed for headache or moderate pain.  Marland Kitchen losartan (COZAAR) 100 MG tablet TAKE ONE TABLET (100 MG DOSE) BY MOUTH DAILY  . Multiple Vitamin (MULITIVITAMIN WITH MINERALS) TABS Take 1 tablet by mouth every other day.   . Nerve Stimulator (PRO COMFORT TENS ELECTRODES) MISC Apply 2 patches topically daily as needed.  . Nerve Stimulator (PRO COMFORT  TENS UNIT) DEVI Apply 1 Units topically daily as needed.  . niacin (NIASPAN) 1000 MG CR tablet Take 1 tablet (1,000 mg total) by mouth at bedtime.  . pantoprazole (PROTONIX) 40 MG tablet Take 1 tablet (40 mg total) by mouth daily.  . promethazine (PHENERGAN) 25 MG tablet Take 1 tablet (25 mg total) by mouth every 8 (eight) hours as needed for nausea or vomiting.  . silver sulfADIAZINE (SILVADENE) 1 % cream Apply 1 application topically daily as needed (wound care).   Marland Kitchen testosterone cypionate (DEPOTESTOSTERONE CYPIONATE) 200 MG/ML injection Inject 0.75 mLs (150 mg total) into the muscle every 14 (fourteen) days.  Marland Kitchen tiZANidine (ZANAFLEX) 4 MG tablet Take 0.5-1 tablets (2-4 mg total) by mouth 2 (two) times daily. For muscle spasms  . traMADol (ULTRAM) 50 MG tablet TAKE 1 TABLET BY MOUTH EVERY 6 HOURS AS NEEDED.  Marland Kitchen trimethoprim (TRIMPEX) 100 MG tablet TAKE 1 TABLET BY MOUTH EVERY OTHER DAY     Past Surgical History:  Procedure Laterality Date  . ABDOMINAL AORTOGRAM W/LOWER EXTREMITY Bilateral 11/19/2018   Procedure: ABDOMINAL AORTOGRAM W/LOWER EXTREMITY;  Surgeon: Angelia Mould, MD;  Location: [facility];  Service: Cardiovascular;  Laterality: Bilateral;  . [place]  . carpal tunnel Right 11/23/13  . coloncoscopy  2008   Dr. Laural Golden: few small diverticula at ascending colon and external hemorrhoids,  otherwise normal  . COLONOSCOPY WITH PROPOFOL N/A 04/16/2017   Procedure: COLONOSCOPY WITH PROPOFOL;  Surgeon: Daneil Dolin, MD;  [facility];  Service: Endoscopy;  Laterality: N/A;  11:15am  . dilation of esophagus    . ESOPHAGOGASTRODUODENOSCOPY N/A 07/26/2012   [id number] Schatzki's ring. Hiatal hernia, likely upper GI bleed secondary to MW tear  . HERNIA REPAIR  02/03/2001   paraplegia and right inguinal hernia  . left leg surgery due to staph infection  2005  . LIPOMA EXCISION  07/07/2011   Procedure: EXCISION LIPOMA;  Surgeon: Imogene Burn. Georgette Dover, MD;   [facility];  Service: General;  Laterality: Right;  . LOWER EXTREMITY ANGIOGRAPHY N/A 07/16/2018   Procedure: LOWER EXTREMITY ANGIOGRAPHY;  Surgeon: Angelia Mould, MD;  Location: [facility];  Service: Cardiovascular;  Laterality: N/A;  . MULTIPLE TOOTH EXTRACTIONS    . PERIPHERAL VASCULAR INTERVENTION  07/16/2018   Procedure: PERIPHERAL VASCULAR INTERVENTION;  Surgeon: Angelia Mould, MD;  Location: [facility];  Service: Cardiovascular;;  right common iliac  . POLYPECTOMY  04/16/2017   Procedure: POLYPECTOMY;  Surgeon: Daneil Dolin, MD;  [facility];  Service: Endoscopy;;  colon  . SURGERY FOR DECUBITUS ULCER      Family History  Problem Relation Age of Onset  . COPD Father   . Heart disease Father   . Aneurysm Father   . Hyperlipidemia Mother   . Hypertension Mother   . Diabetes Sister   . Arrhythmia Daughter   . Stroke Sister   . Colon cancer Maternal Grandfather     New complaints: - says he has a bone in his neck protruding outward - had a bump removed from face that has come back.  Social history: Lives by hisself  Controlled substance contract: n/a    Review of Systems  Constitutional: Negative for diaphoresis.  Eyes: Negative for pain.  Respiratory: Negative for shortness of breath.   Cardiovascular: Negative for chest pain, palpitations and leg swelling.  Gastrointestinal: Negative for abdominal pain.  Endocrine: Negative for polydipsia.  Skin: Negative for rash.  Neurological: Negative for dizziness, weakness and headaches.  Hematological: Does not bruise/bleed easily.  All other systems reviewed and are negative.      Objective:   Physical Exam Vitals and nursing note reviewed.  Constitutional:      Appearance: Normal appearance. He is well-developed and well-nourished.  HENT:     Head: Normocephalic.     Nose: Nose normal.     Mouth/Throat:     Mouth: Oropharynx is clear and moist.  Eyes:      Extraocular Movements: EOM normal.     Pupils: Pupils are equal, round, and reactive to light.  Neck:     Thyroid: No thyroid mass or thyromegaly.     Vascular: No carotid bruit or JVD.     Trachea: Phonation normal.  Cardiovascular:     Rate and Rhythm: Normal rate and regular rhythm.  Pulmonary:     Effort: Pulmonary effort is normal. No respiratory distress.     Breath sounds: Normal breath sounds.  Abdominal:     General: Bowel sounds are normal. Aorta is normal.     Palpations: Abdomen is soft.     Tenderness: There is no abdominal tenderness.  Musculoskeletal:        General: Normal range of motion.     Cervical back: Normal range of motion and neck supple.     Comments: Proximal  end of collar bone protrudent on right- nontender.  Lymphadenopathy:     Cervical: No cervical adenopathy.  Skin:    General: Skin is warm and dry.     Comments: Blackened spot on left side of face  Neurological:     Mental Status: He is alert and oriented to person, place, and time.  Psychiatric:        Mood and Affect: Mood and affect normal.        Behavior: Behavior normal.        Thought Content: Thought content normal.        Judgment: Judgment normal.    BP (!) 160/85   Pulse 75   Temp (!) 97.1 F (36.2 C) (Temporal)   Resp 20   SpO2 97%         Assessment & Plan:  Dustin Medina comes in today with chief complaint of Medical Management of Chronic Issues   Diagnosis and orders addressed:  1. Essential hypertension Low sodium diet - hydrochlorothiazide (HYDRODIURIL) 25 MG tablet; Take 1 tablet (25 mg total) by mouth daily.  Dispense: 90 tablet; Refill: 1 - losartan (COZAAR) 100 MG tablet; TAKE ONE TABLET (100 MG DOSE) BY MOUTH DAILY  Dispense: 90 tablet; Refill: 1 - CBC with Differential/Platelet - CMP14+EGFR  2. Hyperlipidemia with target LDL less than 100 Low fat diet - atorvastatin (LIPITOR) 10 MG tablet; Take 1 tablet (10 mg total) by mouth daily at 6 PM.  Dispense:  90 tablet; Refill: 1 - niacin (NIASPAN) 1000 MG CR tablet; Take 1 tablet (1,000 mg total) by mouth at bedtime.  Dispense: 90 tablet; Refill: 1 - Lipid panel  3. Gastroesophageal reflux disease, unspecified whether esophagitis present Avoid spicy foods Do not eat 2 hours prior to bedtime  4. Wheelchair dependence Really encouraged to get a new wheel chair  5. Paraplegic spinal paralysis ([place]) Continue wound care - testosterone cypionate (DEPOTESTOSTERONE CYPIONATE) 200 MG/ML injection; Inject 0.75 mLs (150 mg total) into the muscle every 14 (fourteen) days.  Dispense: 6 mL; Refill: 1 - clopidogrel (PLAVIX) 75 MG tablet; Take 1 tablet (75 mg total) by mouth daily with breakfast.  Dispense: 90 tablet; Refill: 2  6. Tobacco smoker within last 12 months Smoking cessation encouraged  7. Low testosterone Continue testosterone  8. Gastroesophageal reflux disease with esophagitis and hemorrhage Avoid spicy foods Do not eat 2 hours prior to bedtime - pantoprazole (PROTONIX) 40 MG tablet; Take 1 tablet (40 mg total) by mouth daily.  Dispense: 90 tablet; Refill: 1  9. Facial lesion Do not pick or scratch - Ambulatory referral to Dermatology   Labs pending Health Maintenance reviewed Diet and exercise encouraged  Follow up plan: 6 months   [place], FNP

## 2020-02-07 LAB — CBC WITH DIFFERENTIAL/PLATELET
Basophils Absolute: 0.1 10*3/uL (ref 0.0–0.2)
Basos: 0 %
EOS (ABSOLUTE): 0.2 10*3/uL (ref 0.0–0.4)
Eos: 1 %
Hematocrit: 39.5 % (ref 37.5–51.0)
Hemoglobin: 13.1 g/dL (ref 13.0–17.7)
Immature Grans (Abs): 0.1 10*3/uL (ref 0.0–0.1)
Immature Granulocytes: 1 %
Lymphocytes Absolute: 4.1 10*3/uL — ABNORMAL HIGH (ref 0.7–3.1)
Lymphs: 25 %
MCH: 28.2 pg (ref 26.6–33.0)
MCHC: 33.2 g/dL (ref 31.5–35.7)
MCV: 85 fL (ref 79–97)
Monocytes Absolute: 1.3 10*3/uL — ABNORMAL HIGH (ref 0.1–0.9)
Monocytes: 8 %
Neutrophils Absolute: 10.8 10*3/uL — ABNORMAL HIGH (ref 1.4–7.0)
Neutrophils: 65 %
Platelets: 336 10*3/uL (ref 150–450)
RBC: 4.64 x10E6/uL (ref 4.14–5.80)
RDW: 14.5 % (ref 11.6–15.4)
WBC: 16.6 10*3/uL — ABNORMAL HIGH (ref 3.4–10.8)

## 2020-02-07 LAB — CMP14+EGFR
ALT: 11 IU/L (ref 0–44)
AST: 15 IU/L (ref 0–40)
Albumin/Globulin Ratio: 1.4 (ref 1.2–2.2)
Albumin: 3.8 g/dL (ref 3.8–4.8)
Alkaline Phosphatase: 98 IU/L (ref 44–121)
BUN/Creatinine Ratio: 25 — ABNORMAL HIGH (ref 10–24)
BUN: 16 mg/dL (ref 8–27)
Bilirubin Total: 0.6 mg/dL (ref 0.0–1.2)
CO2: 26 mmol/L (ref 20–29)
Calcium: 8.9 mg/dL (ref 8.6–10.2)
Chloride: 100 mmol/L (ref 96–106)
Creatinine, Ser: 0.65 mg/dL — ABNORMAL LOW (ref 0.76–1.27)
GFR calc Af Amer: 116 mL/min/{1.73_m2} (ref >59)
GFR calc non Af Amer: 100 mL/min/{1.73_m2} (ref >59)
Globulin, Total: 2.8 g/dL (ref 1.5–4.5)
Glucose: 92 mg/dL (ref 65–99)
Potassium: 4.2 mmol/L (ref 3.5–5.2)
Sodium: 141 mmol/L (ref 134–144)
Total Protein: 6.6 g/dL (ref 6.0–8.5)

## 2020-02-07 LAB — LIPID PANEL
Chol/HDL Ratio: 3.4 ratio (ref 0.0–5.0)
Cholesterol, Total: 144 mg/dL (ref 100–199)
HDL: 42 mg/dL (ref >39)
LDL Chol Calc (NIH): 71 mg/dL (ref 0–99)
Triglycerides: 186 mg/dL — ABNORMAL HIGH (ref 0–149)
VLDL Cholesterol Cal: 31 mg/dL (ref 5–40)

## 2020-02-20 ENCOUNTER — Encounter: Payer: PPO | Admitting: Physical Medicine and Rehabilitation

## 2020-03-01 DIAGNOSIS — L708 Other acne: Secondary | ICD-10-CM | POA: Diagnosis not present

## 2020-03-01 DIAGNOSIS — L820 Inflamed seborrheic keratosis: Secondary | ICD-10-CM | POA: Diagnosis not present

## 2020-03-11 ENCOUNTER — Other Ambulatory Visit: Payer: Self-pay | Admitting: Physical Medicine and Rehabilitation

## 2020-03-11 ENCOUNTER — Other Ambulatory Visit: Payer: Self-pay | Admitting: Nurse Practitioner

## 2020-03-13 DIAGNOSIS — L89153 Pressure ulcer of sacral region, stage 3: Secondary | ICD-10-CM | POA: Diagnosis not present

## 2020-03-13 DIAGNOSIS — L89152 Pressure ulcer of sacral region, stage 2: Secondary | ICD-10-CM | POA: Diagnosis not present

## 2020-03-13 DIAGNOSIS — G8929 Other chronic pain: Secondary | ICD-10-CM | POA: Diagnosis not present

## 2020-03-13 DIAGNOSIS — G8220 Paraplegia, unspecified: Secondary | ICD-10-CM | POA: Diagnosis not present

## 2020-03-13 DIAGNOSIS — L97519 Non-pressure chronic ulcer of other part of right foot with unspecified severity: Secondary | ICD-10-CM | POA: Diagnosis not present

## 2020-03-13 DIAGNOSIS — E7800 Pure hypercholesterolemia, unspecified: Secondary | ICD-10-CM | POA: Diagnosis not present

## 2020-03-13 DIAGNOSIS — L89510 Pressure ulcer of right ankle, unstageable: Secondary | ICD-10-CM | POA: Diagnosis not present

## 2020-03-13 DIAGNOSIS — I739 Peripheral vascular disease, unspecified: Secondary | ICD-10-CM | POA: Diagnosis not present

## 2020-03-13 DIAGNOSIS — L97529 Non-pressure chronic ulcer of other part of left foot with unspecified severity: Secondary | ICD-10-CM | POA: Diagnosis not present

## 2020-03-13 DIAGNOSIS — Z79899 Other long term (current) drug therapy: Secondary | ICD-10-CM | POA: Diagnosis not present

## 2020-03-13 DIAGNOSIS — M549 Dorsalgia, unspecified: Secondary | ICD-10-CM | POA: Diagnosis not present

## 2020-03-13 DIAGNOSIS — I10 Essential (primary) hypertension: Secondary | ICD-10-CM | POA: Diagnosis not present

## 2020-03-13 DIAGNOSIS — Z8614 Personal history of Methicillin resistant Staphylococcus aureus infection: Secondary | ICD-10-CM | POA: Diagnosis not present

## 2020-03-13 DIAGNOSIS — Z7902 Long term (current) use of antithrombotics/antiplatelets: Secondary | ICD-10-CM | POA: Diagnosis not present

## 2020-03-15 DIAGNOSIS — R339 Retention of urine, unspecified: Secondary | ICD-10-CM | POA: Diagnosis not present

## 2020-03-15 DIAGNOSIS — N319 Neuromuscular dysfunction of bladder, unspecified: Secondary | ICD-10-CM | POA: Diagnosis not present

## 2020-03-15 DIAGNOSIS — N390 Urinary tract infection, site not specified: Secondary | ICD-10-CM | POA: Diagnosis not present

## 2020-03-26 ENCOUNTER — Encounter: Payer: PPO | Admitting: Physical Medicine and Rehabilitation

## 2020-03-30 ENCOUNTER — Other Ambulatory Visit: Payer: Self-pay

## 2020-03-30 ENCOUNTER — Encounter: Payer: PPO | Attending: Physical Medicine and Rehabilitation | Admitting: Physical Medicine and Rehabilitation

## 2020-03-30 ENCOUNTER — Encounter: Payer: Self-pay | Admitting: Physical Medicine and Rehabilitation

## 2020-03-30 VITALS — BP 168/91 | HR 74 | Temp 98.0°F | Ht 71.0 in | Wt 157.2 lb

## 2020-03-30 DIAGNOSIS — R252 Cramp and spasm: Secondary | ICD-10-CM | POA: Diagnosis not present

## 2020-03-30 DIAGNOSIS — K642 Third degree hemorrhoids: Secondary | ICD-10-CM | POA: Diagnosis not present

## 2020-03-30 DIAGNOSIS — L89153 Pressure ulcer of sacral region, stage 3: Secondary | ICD-10-CM | POA: Diagnosis not present

## 2020-03-30 DIAGNOSIS — S24104S Unspecified injury at T11-T12 level of thoracic spinal cord, sequela: Secondary | ICD-10-CM | POA: Diagnosis not present

## 2020-03-30 DIAGNOSIS — M25511 Pain in right shoulder: Secondary | ICD-10-CM | POA: Insufficient documentation

## 2020-03-30 DIAGNOSIS — K592 Neurogenic bowel, not elsewhere classified: Secondary | ICD-10-CM | POA: Insufficient documentation

## 2020-03-30 DIAGNOSIS — M25512 Pain in left shoulder: Secondary | ICD-10-CM | POA: Diagnosis not present

## 2020-03-30 DIAGNOSIS — Z993 Dependence on wheelchair: Secondary | ICD-10-CM | POA: Diagnosis not present
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
## 2020-04-02 DIAGNOSIS — G8220 Paraplegia, unspecified: Secondary | ICD-10-CM | POA: Diagnosis not present

## 2020-04-02 DIAGNOSIS — I10 Essential (primary) hypertension: Secondary | ICD-10-CM | POA: Diagnosis not present

## 2020-04-02 DIAGNOSIS — T148XXS Other injury of unspecified body region, sequela: Secondary | ICD-10-CM | POA: Diagnosis not present

## 2020-04-02 DIAGNOSIS — I739 Peripheral vascular disease, unspecified: Secondary | ICD-10-CM | POA: Diagnosis not present

## 2020-04-02 DIAGNOSIS — Z95820 Peripheral vascular angioplasty status with implants and grafts: Secondary | ICD-10-CM | POA: Diagnosis not present

## 2020-04-02 DIAGNOSIS — Z993 Dependence on wheelchair: Secondary | ICD-10-CM | POA: Diagnosis not present

## 2020-04-02 DIAGNOSIS — L89514 Pressure ulcer of right ankle, stage 4: Secondary | ICD-10-CM | POA: Diagnosis not present

## 2020-04-03 ENCOUNTER — Encounter: Payer: Self-pay | Admitting: Gastroenterology

## 2020-04-04 ENCOUNTER — Telehealth: Payer: Self-pay | Admitting: Physical Medicine and Rehabilitation

## 2020-04-04 NOTE — Telephone Encounter (Signed)
Otila Kluver From [employer] contacted our office.  States they have called left messages, also been to patients home and left card sent no contact letter to try and fulfill request for new wheelchair. Patient has been unresponsive.  We verified phone number and address of patient to ensure they had correct information on file. He can call Otila Kluver at [phone] ext [phone] for more information.

## 2020-04-04 NOTE — Telephone Encounter (Signed)
I attempted to call pt as well as Tina at [employer]- I could reach neither-  I can continue to try and do so.  But, might take awhile.

## 2020-04-06 ENCOUNTER — Encounter: Payer: Self-pay | Admitting: Gastroenterology

## 2020-04-10 DIAGNOSIS — L97529 Non-pressure chronic ulcer of other part of left foot with unspecified severity: Secondary | ICD-10-CM | POA: Diagnosis not present

## 2020-04-10 DIAGNOSIS — K626 Ulcer of anus and rectum: Secondary | ICD-10-CM | POA: Diagnosis not present

## 2020-04-10 DIAGNOSIS — Z79899 Other long term (current) drug therapy: Secondary | ICD-10-CM | POA: Diagnosis not present

## 2020-04-10 DIAGNOSIS — I10 Essential (primary) hypertension: Secondary | ICD-10-CM | POA: Diagnosis not present

## 2020-04-10 DIAGNOSIS — F17200 Nicotine dependence, unspecified, uncomplicated: Secondary | ICD-10-CM | POA: Diagnosis not present

## 2020-04-10 DIAGNOSIS — I739 Peripheral vascular disease, unspecified: Secondary | ICD-10-CM | POA: Diagnosis not present

## 2020-04-10 DIAGNOSIS — L97519 Non-pressure chronic ulcer of other part of right foot with unspecified severity: Secondary | ICD-10-CM | POA: Diagnosis not present

## 2020-04-10 DIAGNOSIS — L89159 Pressure ulcer of sacral region, unspecified stage: Secondary | ICD-10-CM | POA: Diagnosis not present

## 2020-04-10 DIAGNOSIS — L89153 Pressure ulcer of sacral region, stage 3: Secondary | ICD-10-CM | POA: Diagnosis not present

## 2020-04-10 DIAGNOSIS — Z7902 Long term (current) use of antithrombotics/antiplatelets: Secondary | ICD-10-CM | POA: Diagnosis not present

## 2020-04-10 DIAGNOSIS — E7800 Pure hypercholesterolemia, unspecified: Secondary | ICD-10-CM | POA: Diagnosis not present

## 2020-04-10 NOTE — Telephone Encounter (Signed)
I contacted mr Gillis Ends and gave him Tina's number

## 2020-04-13 ENCOUNTER — Ambulatory Visit: Payer: PPO | Admitting: Physical Medicine and Rehabilitation

## 2020-04-24 ENCOUNTER — Ambulatory Visit: Payer: PPO | Admitting: Gastroenterology

## 2020-04-24 ENCOUNTER — Telehealth: Payer: Self-pay | Admitting: Physical Medicine and Rehabilitation

## 2020-04-24 NOTE — Telephone Encounter (Signed)
LVM for Dustin Medina. Dustin Medina From [employer] called states they are unable to get in touch with Dustin Medina.  If he would like a wheelchair evaluation he may contact Dustin Medina @[phone] 

## 2020-04-26 DIAGNOSIS — S24103D Unspecified injury at T7-T10 level of thoracic spinal cord, subsequent encounter: Secondary | ICD-10-CM | POA: Diagnosis not present

## 2020-04-26 DIAGNOSIS — G8220 Paraplegia, unspecified: Secondary | ICD-10-CM | POA: Diagnosis not present

## 2020-04-26 DIAGNOSIS — Z9181 History of falling: Secondary | ICD-10-CM | POA: Diagnosis not present

## 2020-05-08 DIAGNOSIS — G8220 Paraplegia, unspecified: Secondary | ICD-10-CM | POA: Diagnosis not present

## 2020-05-08 DIAGNOSIS — Z9181 History of falling: Secondary | ICD-10-CM | POA: Diagnosis not present

## 2020-05-08 DIAGNOSIS — S24103D Unspecified injury at T7-T10 level of thoracic spinal cord, subsequent encounter: Secondary | ICD-10-CM | POA: Diagnosis not present

## 2020-05-10 ENCOUNTER — Encounter: Payer: Self-pay | Admitting: Gastroenterology

## 2020-05-10 ENCOUNTER — Other Ambulatory Visit: Payer: Self-pay

## 2020-05-10 ENCOUNTER — Ambulatory Visit: Payer: PPO | Admitting: Gastroenterology

## 2020-05-10 VITALS — BP 158/78 | HR 104 | Ht 71.0 in | Wt 154.0 lb

## 2020-05-10 DIAGNOSIS — K5900 Constipation, unspecified: Secondary | ICD-10-CM

## 2020-05-10 DIAGNOSIS — Z86010 Personal history of colonic polyps: Secondary | ICD-10-CM

## 2020-05-10 DIAGNOSIS — K219 Gastro-esophageal reflux disease without esophagitis: Secondary | ICD-10-CM | POA: Diagnosis not present

## 2020-05-10 DIAGNOSIS — R58 Hemorrhage, not elsewhere classified: Secondary | ICD-10-CM | POA: Diagnosis not present

## 2020-05-10 DIAGNOSIS — L89159 Pressure ulcer of sacral region, unspecified stage: Secondary | ICD-10-CM | POA: Diagnosis not present

## 2020-05-10 DIAGNOSIS — K644 Residual hemorrhoidal skin tags: Secondary | ICD-10-CM | POA: Diagnosis not present

## 2020-05-10 NOTE — Patient Instructions (Signed)
If you are age 69 or older, your body mass index should be between 23-30. Your Body mass index is 21.48 kg/m. If this is out of the aforementioned range listed, please consider follow up with your Primary Care Provider.  If you are age 56 or younger, your body mass index should be between 19-25. Your Body mass index is 21.48 kg/m. If this is out of the aformentioned range listed, please consider follow up with your Primary Care Provider.   Take Miralax 1 capful mixed in 8 ounces of water every other day.  We are referring you to [facility] to see Dr Nadeen Landau. They will contact you to schedule your appointment. If you do not hear from them within 2 weeks please contact our office at [phone].  Due to recent changes in healthcare laws, you may see the results of your imaging and laboratory studies on MyChart before your provider has had a chance to review them.  We understand that in some cases there may be results that are confusing or concerning to you. Not all laboratory results come back in the same time frame and the provider may be waiting for multiple results in order to interpret others.  Please give [place] 48 hours in order for your provider to thoroughly review all the results before contacting the office for clarification of your results.   Thank you for choosing me and [facility].  Vito Cirigliano, D.O.
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
## 2020-05-14 DIAGNOSIS — N390 Urinary tract infection, site not specified: Secondary | ICD-10-CM | POA: Diagnosis not present

## 2020-05-14 DIAGNOSIS — N319 Neuromuscular dysfunction of bladder, unspecified: Secondary | ICD-10-CM | POA: Diagnosis not present

## 2020-05-20 ENCOUNTER — Other Ambulatory Visit: Payer: Self-pay | Admitting: Nurse Practitioner

## 2020-05-20 DIAGNOSIS — G8220 Paraplegia, unspecified: Secondary | ICD-10-CM

## 2020-05-22 DIAGNOSIS — E7800 Pure hypercholesterolemia, unspecified: Secondary | ICD-10-CM | POA: Diagnosis not present

## 2020-05-22 DIAGNOSIS — Z7902 Long term (current) use of antithrombotics/antiplatelets: Secondary | ICD-10-CM | POA: Diagnosis not present

## 2020-05-22 DIAGNOSIS — I739 Peripheral vascular disease, unspecified: Secondary | ICD-10-CM | POA: Diagnosis not present

## 2020-05-22 DIAGNOSIS — L89159 Pressure ulcer of sacral region, unspecified stage: Secondary | ICD-10-CM | POA: Diagnosis not present

## 2020-05-22 DIAGNOSIS — L89520 Pressure ulcer of left ankle, unstageable: Secondary | ICD-10-CM | POA: Diagnosis not present

## 2020-05-22 DIAGNOSIS — M549 Dorsalgia, unspecified: Secondary | ICD-10-CM | POA: Diagnosis not present

## 2020-05-22 DIAGNOSIS — I10 Essential (primary) hypertension: Secondary | ICD-10-CM | POA: Diagnosis not present

## 2020-05-22 DIAGNOSIS — L89153 Pressure ulcer of sacral region, stage 3: Secondary | ICD-10-CM | POA: Diagnosis not present

## 2020-05-22 DIAGNOSIS — L89523 Pressure ulcer of left ankle, stage 3: Secondary | ICD-10-CM | POA: Insufficient documentation

## 2020-05-22 DIAGNOSIS — Z4800 Encounter for change or removal of nonsurgical wound dressing: Secondary | ICD-10-CM | POA: Diagnosis not present

## 2020-05-22 DIAGNOSIS — L97519 Non-pressure chronic ulcer of other part of right foot with unspecified severity: Secondary | ICD-10-CM | POA: Diagnosis not present

## 2020-05-22 DIAGNOSIS — Z79899 Other long term (current) drug therapy: Secondary | ICD-10-CM | POA: Diagnosis not present

## 2020-05-22 DIAGNOSIS — G8929 Other chronic pain: Secondary | ICD-10-CM | POA: Diagnosis not present

## 2020-05-22 DIAGNOSIS — L97529 Non-pressure chronic ulcer of other part of left foot with unspecified severity: Secondary | ICD-10-CM | POA: Diagnosis not present

## 2020-05-23 DIAGNOSIS — Z9181 History of falling: Secondary | ICD-10-CM | POA: Diagnosis not present

## 2020-05-23 DIAGNOSIS — Z4789 Encounter for other orthopedic aftercare: Secondary | ICD-10-CM | POA: Diagnosis not present

## 2020-05-23 DIAGNOSIS — S24114D Complete lesion at T11-T12 level of thoracic spinal cord, subsequent encounter: Secondary | ICD-10-CM | POA: Diagnosis not present

## 2020-06-05 DIAGNOSIS — L89520 Pressure ulcer of left ankle, unstageable: Secondary | ICD-10-CM | POA: Diagnosis not present

## 2020-06-05 DIAGNOSIS — L89153 Pressure ulcer of sacral region, stage 3: Secondary | ICD-10-CM | POA: Diagnosis not present

## 2020-06-18 DIAGNOSIS — R339 Retention of urine, unspecified: Secondary | ICD-10-CM | POA: Diagnosis not present

## 2020-06-18 DIAGNOSIS — N319 Neuromuscular dysfunction of bladder, unspecified: Secondary | ICD-10-CM | POA: Diagnosis not present

## 2020-06-18 DIAGNOSIS — N390 Urinary tract infection, site not specified: Secondary | ICD-10-CM | POA: Diagnosis not present

## 2020-06-25 DIAGNOSIS — R972 Elevated prostate specific antigen [PSA]: Secondary | ICD-10-CM | POA: Diagnosis not present

## 2020-06-27 ENCOUNTER — Other Ambulatory Visit: Payer: Self-pay

## 2020-06-27 ENCOUNTER — Encounter: Payer: Self-pay | Admitting: Physical Medicine and Rehabilitation

## 2020-06-27 ENCOUNTER — Encounter: Payer: PPO | Attending: Physical Medicine and Rehabilitation | Admitting: Physical Medicine and Rehabilitation

## 2020-06-27 VITALS — BP 142/85 | HR 85 | Temp 98.0°F | Wt 181.2 lb

## 2020-06-27 DIAGNOSIS — Z5181 Encounter for therapeutic drug level monitoring: Secondary | ICD-10-CM | POA: Diagnosis not present

## 2020-06-27 DIAGNOSIS — G894 Chronic pain syndrome: Secondary | ICD-10-CM

## 2020-06-27 DIAGNOSIS — S24104S Unspecified injury at T11-T12 level of thoracic spinal cord, sequela: Secondary | ICD-10-CM | POA: Diagnosis not present

## 2020-06-27 DIAGNOSIS — M25511 Pain in right shoulder: Secondary | ICD-10-CM

## 2020-06-27 DIAGNOSIS — M25512 Pain in left shoulder: Secondary | ICD-10-CM

## 2020-06-27 DIAGNOSIS — Z993 Dependence on wheelchair: Secondary | ICD-10-CM

## 2020-06-27 DIAGNOSIS — Z79891 Long term (current) use of opiate analgesic: Secondary | ICD-10-CM | POA: Diagnosis not present

## 2020-06-27 DIAGNOSIS — G8929 Other chronic pain: Secondary | ICD-10-CM

## 2020-06-27 DIAGNOSIS — L89520 Pressure ulcer of left ankle, unstageable: Secondary | ICD-10-CM

## 2020-06-27 DIAGNOSIS — M545 Low back pain, unspecified: Secondary | ICD-10-CM | POA: Diagnosis not present

## 2020-06-27 MED ORDER — TRAMADOL HCL 50 MG PO TABS: 50.0000 mg | ORAL_TABLET | Freq: Four times a day (QID) | ORAL | 5 refills | Status: DC | PRN
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
## 2020-06-28 DIAGNOSIS — K649 Unspecified hemorrhoids: Secondary | ICD-10-CM | POA: Diagnosis not present

## 2020-07-01 LAB — DRUG TOX MONITOR 1 W/CONF, ORAL FLD
Amphetamines: NEGATIVE ng/mL
Barbiturates: NEGATIVE ng/mL
Benzodiazepines: NEGATIVE ng/mL
Buprenorphine: NEGATIVE ng/mL
Cocaine: NEGATIVE ng/mL
Cotinine: 52.2 ng/mL — ABNORMAL HIGH
Fentanyl: NEGATIVE ng/mL
Heroin Metabolite: NEGATIVE ng/mL
MARIJUANA: NEGATIVE ng/mL
MDMA: NEGATIVE ng/mL
Meprobamate: NEGATIVE ng/mL
Methadone: NEGATIVE ng/mL
Nicotine Metabolite: POSITIVE ng/mL — AB
Opiates: NEGATIVE ng/mL
Phencyclidine: NEGATIVE ng/mL
Tapentadol: NEGATIVE ng/mL
Tramadol: >500 ng/mL — ABNORMAL HIGH
Tramadol: POSITIVE ng/mL — AB
Zolpidem: NEGATIVE ng/mL

## 2020-07-01 LAB — DRUG TOX ALC METAB W/CON, ORAL FLD: Alcohol Metabolite: NEGATIVE ng/mL (ref <25)

## 2020-07-02 DIAGNOSIS — N312 Flaccid neuropathic bladder, not elsewhere classified: Secondary | ICD-10-CM | POA: Diagnosis not present

## 2020-07-02 DIAGNOSIS — R972 Elevated prostate specific antigen [PSA]: Secondary | ICD-10-CM | POA: Diagnosis not present

## 2020-07-02 DIAGNOSIS — N3020 Other chronic cystitis without hematuria: Secondary | ICD-10-CM | POA: Diagnosis not present

## 2020-07-03 DIAGNOSIS — Z7902 Long term (current) use of antithrombotics/antiplatelets: Secondary | ICD-10-CM | POA: Diagnosis not present

## 2020-07-03 DIAGNOSIS — L89520 Pressure ulcer of left ankle, unstageable: Secondary | ICD-10-CM | POA: Diagnosis not present

## 2020-07-03 DIAGNOSIS — Z79899 Other long term (current) drug therapy: Secondary | ICD-10-CM | POA: Diagnosis not present

## 2020-07-03 DIAGNOSIS — I739 Peripheral vascular disease, unspecified: Secondary | ICD-10-CM | POA: Diagnosis not present

## 2020-07-03 DIAGNOSIS — E7800 Pure hypercholesterolemia, unspecified: Secondary | ICD-10-CM | POA: Diagnosis not present

## 2020-07-03 DIAGNOSIS — L97519 Non-pressure chronic ulcer of other part of right foot with unspecified severity: Secondary | ICD-10-CM | POA: Diagnosis not present

## 2020-07-03 DIAGNOSIS — L89159 Pressure ulcer of sacral region, unspecified stage: Secondary | ICD-10-CM | POA: Diagnosis not present

## 2020-07-03 DIAGNOSIS — L89152 Pressure ulcer of sacral region, stage 2: Secondary | ICD-10-CM | POA: Diagnosis not present

## 2020-07-03 DIAGNOSIS — I10 Essential (primary) hypertension: Secondary | ICD-10-CM | POA: Diagnosis not present

## 2020-07-10 ENCOUNTER — Telehealth: Payer: Self-pay | Admitting: *Deleted

## 2020-07-10 NOTE — Telephone Encounter (Signed)
Oral swab drug screen was consistent for prescribed medications.  ?

## 2020-07-17 DIAGNOSIS — G8220 Paraplegia, unspecified: Secondary | ICD-10-CM | POA: Diagnosis not present

## 2020-07-17 DIAGNOSIS — S24103A Unspecified injury at T7-T10 level of thoracic spinal cord, initial encounter: Secondary | ICD-10-CM | POA: Diagnosis not present

## 2020-07-17 DIAGNOSIS — S24103D Unspecified injury at T7-T10 level of thoracic spinal cord, subsequent encounter: Secondary | ICD-10-CM | POA: Diagnosis not present

## 2020-07-17 DIAGNOSIS — Z461 Encounter for fitting and adjustment of hearing aid: Secondary | ICD-10-CM | POA: Diagnosis not present

## 2020-07-17 DIAGNOSIS — Z9181 History of falling: Secondary | ICD-10-CM | POA: Diagnosis not present

## 2020-07-19 DIAGNOSIS — Z9181 History of falling: Secondary | ICD-10-CM | POA: Diagnosis not present

## 2020-07-19 DIAGNOSIS — S24103D Unspecified injury at T7-T10 level of thoracic spinal cord, subsequent encounter: Secondary | ICD-10-CM | POA: Diagnosis not present

## 2020-07-19 DIAGNOSIS — Z4689 Encounter for fitting and adjustment of other specified devices: Secondary | ICD-10-CM | POA: Diagnosis not present

## 2020-07-19 DIAGNOSIS — G8220 Paraplegia, unspecified: Secondary | ICD-10-CM | POA: Diagnosis not present

## 2020-07-21 ENCOUNTER — Other Ambulatory Visit: Payer: Self-pay

## 2020-07-21 DIAGNOSIS — L97909 Non-pressure chronic ulcer of unspecified part of unspecified lower leg with unspecified severity: Secondary | ICD-10-CM

## 2020-07-21 DIAGNOSIS — I70299 Other atherosclerosis of native arteries of extremities, unspecified extremity: Secondary | ICD-10-CM

## 2020-07-23 ENCOUNTER — Encounter: Payer: Self-pay | Admitting: Nurse Practitioner

## 2020-07-23 ENCOUNTER — Other Ambulatory Visit: Payer: Self-pay

## 2020-07-23 ENCOUNTER — Ambulatory Visit (INDEPENDENT_AMBULATORY_CARE_PROVIDER_SITE_OTHER): Payer: PPO | Admitting: Nurse Practitioner

## 2020-07-23 VITALS — BP 149/84 | HR 81 | Temp 97.9°F | Resp 20

## 2020-07-23 DIAGNOSIS — G8220 Paraplegia, unspecified: Secondary | ICD-10-CM | POA: Diagnosis not present

## 2020-07-23 NOTE — Progress Notes (Signed)
   Subjective:    Patient ID: Dustin Medina, male    DOB: 1950-12-21, 71 y.o.   MRN: [id number]   Chief Complaint: Needs rx for boots for braces   HPI Patient comes in today need some orthotics. He had a t12 spinal cord injury in 2015. He is in wheel chair most of the time. He has boots that hhok to leg braces which allows him to be able  To stand and walk. The boots are no longer functional and he needs new ones. They are called long leg braces with boots.     Review of Systems  Constitutional: Negative for diaphoresis.  Eyes: Negative for pain.  Respiratory: Negative for shortness of breath.   Cardiovascular: Negative for chest pain, palpitations and leg swelling.  Gastrointestinal: Negative for abdominal pain.  Endocrine: Negative for polydipsia.  Skin: Negative for rash.  Neurological: Negative for dizziness, weakness and headaches.  Hematological: Does not bruise/bleed easily.  All other systems reviewed and are negative.      Objective:   Physical Exam Vitals and nursing note reviewed.  Constitutional:      Appearance: Normal appearance.  Cardiovascular:     Rate and Rhythm: Normal rate and regular rhythm.     Heart sounds: Normal heart sounds.  Pulmonary:     Breath sounds: Normal breath sounds.  Musculoskeletal:     Comments: In wheel chair with paralysis of lower ext.  Neurological:     General: No focal deficit present.     Mental Status: He is alert and oriented to person, place, and time.  Psychiatric:        Mood and Affect: Mood normal.        Behavior: Behavior normal.    BP (!) 149/84   Pulse 81   Temp 97.9 F (36.6 C) (Temporal)   Resp 20   SpO2 95%        Assessment & Plan:  Dustin Medina in today with chief complaint of Needs rx for boots for braces   1. Paraplegic spinal paralysis [facility]) prescritption written He will let me know if anything else is needed. - For home use only DME Other see comment    The above assessment and  management plan was discussed with the patient. The patient verbalized understanding of and has agreed to the management plan. Patient is aware to call the clinic if symptoms persist or worsen. Patient is aware when to return to the clinic for a follow-up visit. Patient educated on when it is appropriate to go to the emergency department.   [name] Hassell Done, FNP

## 2020-07-24 ENCOUNTER — Ambulatory Visit (INDEPENDENT_AMBULATORY_CARE_PROVIDER_SITE_OTHER): Payer: PPO

## 2020-07-24 DIAGNOSIS — Z0000 Encounter for general adult medical examination without abnormal findings: Secondary | ICD-10-CM | POA: Diagnosis not present

## 2020-07-24 NOTE — Patient Instructions (Signed)
  [place] Maintenance Summary and Written Plan of Care  Mr. Bulson ,  Thank you for allowing me to perform your Medicare Annual Wellness Visit and for your ongoing commitment to your health.   Health Maintenance & Immunization History Health Maintenance  Topic Date Due  . DEXA SCAN  07/07/2015  . COVID-19 Vaccine (3 - Booster for Janssen series) 08/08/2020 (Originally 04/10/2020)  . Zoster Vaccines- Shingrix (1 of 2) 10/23/2020 (Originally 06/28/2001)  . PNA vac Low Risk Adult (1 of 2 - PCV13) 02/05/2021 (Originally 06/28/2016)  . Pneumococcal Vaccine 33-64 Years old (1 of 4 - PCV13) 07/23/2021 (Originally 06/28/1956)  . INFLUENZA VACCINE  09/17/2020  . TETANUS/TDAP  04/22/2025  . COLONOSCOPY (Pts 45-79yrs Insurance coverage will need to be confirmed)  04/17/2027  . Hepatitis C Screening  Completed  . HPV VACCINES  Aged Out   Immunization History  Administered Date(s) Administered  . Influenza,inj,Quad PF,6+ Mos 11/17/2016  . Janssen (J&J) SARS-COV-2 Vaccination 07/19/2019  . Moderna Sars-Covid-2 Vaccination 02/14/2020  . Td 04/23/2015  . Tdap 04/23/2015    These are the patient goals that we discussed: Goals Addressed            This Visit's Progress   . Prevent falls          This is a list of Health Maintenance Items that are overdue or due now: Health Maintenance Due  Topic Date Due  . DEXA SCAN  07/07/2015     Orders/Referrals Placed Today: No orders of the defined types were placed in this encounter.  (Contact our referral department at [phone] if you have not spoken with someone about your referral appointment within the next 5 days)    Follow-up Plan  Scheduled with Chevis Pretty, FNP on 01/25/21 at 10:00am.
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
## 2020-07-31 DIAGNOSIS — I739 Peripheral vascular disease, unspecified: Secondary | ICD-10-CM | POA: Diagnosis not present

## 2020-07-31 DIAGNOSIS — L89159 Pressure ulcer of sacral region, unspecified stage: Secondary | ICD-10-CM | POA: Diagnosis not present

## 2020-07-31 DIAGNOSIS — M549 Dorsalgia, unspecified: Secondary | ICD-10-CM | POA: Diagnosis not present

## 2020-07-31 DIAGNOSIS — L89520 Pressure ulcer of left ankle, unstageable: Secondary | ICD-10-CM | POA: Diagnosis not present

## 2020-07-31 DIAGNOSIS — I10 Essential (primary) hypertension: Secondary | ICD-10-CM | POA: Diagnosis not present

## 2020-07-31 DIAGNOSIS — E7800 Pure hypercholesterolemia, unspecified: Secondary | ICD-10-CM | POA: Diagnosis not present

## 2020-07-31 DIAGNOSIS — Z79899 Other long term (current) drug therapy: Secondary | ICD-10-CM | POA: Diagnosis not present

## 2020-07-31 DIAGNOSIS — Z4800 Encounter for change or removal of nonsurgical wound dressing: Secondary | ICD-10-CM | POA: Diagnosis not present

## 2020-07-31 DIAGNOSIS — Z7902 Long term (current) use of antithrombotics/antiplatelets: Secondary | ICD-10-CM | POA: Diagnosis not present

## 2020-07-31 DIAGNOSIS — G8929 Other chronic pain: Secondary | ICD-10-CM | POA: Diagnosis not present

## 2020-08-07 ENCOUNTER — Ambulatory Visit (INDEPENDENT_AMBULATORY_CARE_PROVIDER_SITE_OTHER): Payer: PPO | Admitting: Nurse Practitioner

## 2020-08-07 ENCOUNTER — Encounter: Payer: Self-pay | Admitting: Nurse Practitioner

## 2020-08-07 ENCOUNTER — Other Ambulatory Visit: Payer: Self-pay

## 2020-08-07 VITALS — BP 162/99 | HR 81 | Temp 98.4°F | Resp 20

## 2020-08-07 DIAGNOSIS — E785 Hyperlipidemia, unspecified: Secondary | ICD-10-CM

## 2020-08-07 DIAGNOSIS — K219 Gastro-esophageal reflux disease without esophagitis: Secondary | ICD-10-CM | POA: Diagnosis not present

## 2020-08-07 DIAGNOSIS — I10 Essential (primary) hypertension: Secondary | ICD-10-CM

## 2020-08-07 DIAGNOSIS — R7989 Other specified abnormal findings of blood chemistry: Secondary | ICD-10-CM

## 2020-08-07 DIAGNOSIS — G8220 Paraplegia, unspecified: Secondary | ICD-10-CM

## 2020-08-07 DIAGNOSIS — K2101 Gastro-esophageal reflux disease with esophagitis, with bleeding: Secondary | ICD-10-CM | POA: Diagnosis not present

## 2020-08-07 DIAGNOSIS — F17200 Nicotine dependence, unspecified, uncomplicated: Secondary | ICD-10-CM

## 2020-08-07 DIAGNOSIS — I739 Peripheral vascular disease, unspecified: Secondary | ICD-10-CM

## 2020-08-07 MED ORDER — AMLODIPINE BESYLATE 10 MG PO TABS: 10.0000 mg | ORAL_TABLET | Freq: Every day | ORAL | 1 refills | Status: DC

## 2020-08-07 MED ORDER — PANTOPRAZOLE SODIUM 40 MG PO TBEC
40.0000 mg | DELAYED_RELEASE_TABLET | Freq: Every day | ORAL | 1 refills | Status: DC
Start: 2020-08-07 — End: 2021-02-07

## 2020-08-07 MED ORDER — LOSARTAN POTASSIUM 100 MG PO TABS: ORAL_TABLET | ORAL | 1 refills | Status: DC

## 2020-08-07 MED ORDER — HYDROCHLOROTHIAZIDE 25 MG PO TABS: 25.0000 mg | ORAL_TABLET | Freq: Every day | ORAL | 1 refills | Status: DC

## 2020-08-07 MED ORDER — TESTOSTERONE CYPIONATE 200 MG/ML IM SOLN: 150.0000 mg | INTRAMUSCULAR | 1 refills | Status: DC

## 2020-08-07 MED ORDER — CLOPIDOGREL BISULFATE 75 MG PO TABS: 75.0000 mg | ORAL_TABLET | Freq: Every day | ORAL | 1 refills | Status: DC

## 2020-08-07 MED ORDER — ATORVASTATIN CALCIUM 10 MG PO TABS
10.0000 mg | ORAL_TABLET | Freq: Every day | ORAL | 1 refills | Status: DC
Start: 2020-08-07 — End: 2021-02-07

## 2020-08-07 NOTE — Progress Notes (Signed)
Subjective:    Patient ID: Dustin Medina, male    DOB: Nov 14, 1950, 70 y.o.   MRN: [id number]   Chief Complaint: medical management of chronic issues     HPI:  1. Essential hypertension No c/o chest pain, sob or headache. He does not check his blood pressure at home. BP Readings from Last 3 Encounters:  07/23/20 (!) 149/84  06/27/20 (!) 142/85  05/10/20 (!) 158/78     2. PVD (peripheral vascular disease) ([place]) Toes stay cold  3. Gastroesophageal reflux disease, unspecified whether esophagitis present Is on protonix daily and is doing well.  4. Paraplegic spinal paralysis ([place]) Is in wheel chair most of the time. Has braces so he can stand, but does not use them often  5. Hyperlipidemia with target LDL less than 100 Does not watch diet but does do some exercises at times. Lab Results  Component Value Date   CHOL 144 02/06/2020   HDL 42 02/06/2020   LDLCALC 71 02/06/2020   TRIG 186 (H) 02/06/2020   CHOLHDL 3.4 02/06/2020     6. Low testosterone Gets shots monthly  7. Tobacco smoker within last 12 months Stop smoking a week ago.    Outpatient Encounter Medications as of 08/07/2020  Medication Sig   atorvastatin (LIPITOR) 10 MG tablet Take 1 tablet (10 mg total) by mouth daily at 6 PM.   clopidogrel (PLAVIX) 75 MG tablet Take 1 tablet (75 mg total) by mouth daily with breakfast.   hydrochlorothiazide (HYDRODIURIL) 25 MG tablet Take 1 tablet (25 mg total) by mouth daily.   ibuprofen (ADVIL) 200 MG tablet Take 200 mg by mouth every 6 (six) hours as needed for headache or moderate pain.   losartan (COZAAR) 100 MG tablet TAKE ONE TABLET (100 MG DOSE) BY MOUTH DAILY   meloxicam (MOBIC) 15 MG tablet Take 1 tablet by mouth daily as needed. Take daily as needed in apheresis   Multiple Vitamin (MULITIVITAMIN WITH MINERALS) TABS Take 1 tablet by mouth every other day.    Nerve Stimulator (PRO COMFORT TENS ELECTRODES) MISC Apply 2 patches topically daily as needed.   Nerve  Stimulator (PRO COMFORT TENS UNIT) DEVI Apply 1 Units topically daily as needed.   niacin (NIASPAN) 1000 MG CR tablet Take 1 tablet (1,000 mg total) by mouth at bedtime.   pantoprazole (PROTONIX) 40 MG tablet Take 1 tablet (40 mg total) by mouth daily. (Patient taking differently: Take 40 mg by mouth once a week.)   promethazine (PHENERGAN) 25 MG tablet Take 1 tablet (25 mg total) by mouth every 8 (eight) hours as needed for nausea or vomiting.   silver sulfADIAZINE (SILVADENE) 1 % cream Apply 1 application topically daily as needed (wound care).    testosterone cypionate (DEPOTESTOSTERONE CYPIONATE) 200 MG/ML injection INJECT 0.75 MLS (150 MG TOTAL) INTO THE MUSCLE EVERY 14 (FOURTEEN) DAYS.   tiZANidine (ZANAFLEX) 4 MG tablet Take 0.5-1 tablets (2-4 mg total) by mouth 2 (two) times daily. For muscle spasms   traMADol (ULTRAM) 50 MG tablet Take 1 tablet (50 mg total) by mouth every 6 (six) hours as needed.   trimethoprim (TRIMPEX) 100 MG tablet TAKE 1 TABLET BY MOUTH EVERY OTHER DAY (Patient taking differently: Take 100 mg by mouth.)   No facility-administered encounter medications on file as of 08/07/2020.    Past Surgical History:  Procedure Laterality Date   ABDOMINAL AORTOGRAM W/LOWER EXTREMITY Bilateral 11/19/2018   Procedure: ABDOMINAL AORTOGRAM W/LOWER EXTREMITY;  Surgeon: Angelia Mould, MD;  Location:  [facility];  Service: Cardiovascular;  Laterality: Bilateral;   BACK SURGERY  1969   carpal tunnel Right 11/23/13   coloncoscopy  2008   Dr. Laural Golden: few small diverticula at ascending colon and external hemorrhoids, otherwise normal   COLONOSCOPY WITH PROPOFOL N/A 04/16/2017   Procedure: COLONOSCOPY WITH PROPOFOL;  Surgeon: Daneil Dolin, MD;  [facility];  Service: Endoscopy;  Laterality: N/A;  11:15am   dilation of esophagus     ESOPHAGOGASTRODUODENOSCOPY N/A 07/26/2012   [id number] Schatzki's ring. Hiatal hernia, likely upper GI bleed secondary to MW  tear   HERNIA REPAIR  02/03/2001   paraplegia and right inguinal hernia   left leg surgery due to staph infection  2005   LIPOMA EXCISION  07/07/2011   Procedure: EXCISION LIPOMA;  Surgeon: Imogene Burn. Georgette Dover, MD;  Location: [facility];  Service: General;  Laterality: Right;   LOWER EXTREMITY ANGIOGRAPHY N/A 07/16/2018   Procedure: LOWER EXTREMITY ANGIOGRAPHY;  Surgeon: Angelia Mould, MD;  Location: [facility];  Service: Cardiovascular;  Laterality: N/A;   MULTIPLE TOOTH EXTRACTIONS     PERIPHERAL VASCULAR INTERVENTION  07/16/2018   Procedure: PERIPHERAL VASCULAR INTERVENTION;  Surgeon: Angelia Mould, MD;  Location: [facility];  Service: Cardiovascular;;  right common iliac   POLYPECTOMY  04/16/2017   Procedure: POLYPECTOMY;  Surgeon: Daneil Dolin, MD;  [facility];  Service: Endoscopy;;  colon   SURGERY FOR DECUBITUS ULCER      Family History  Problem Relation Age of Onset   COPD Father    Heart disease Father    Aneurysm Father    Hyperlipidemia Mother    Hypertension Mother    Diabetes Sister    Arrhythmia Daughter    Stroke Sister    Colon cancer Maternal Grandfather    Esophageal cancer Neg Hx     New complaints: None today  Social history: Lives by hisself  Controlled substance contract: n/a     Review of Systems  Constitutional:  Negative for diaphoresis.  Eyes:  Negative for pain.  Respiratory:  Negative for shortness of breath.   Cardiovascular:  Negative for chest pain, palpitations and leg swelling.  Gastrointestinal:  Negative for abdominal pain.  Endocrine: Negative for polydipsia.  Skin:  Negative for rash.  Neurological:  Negative for dizziness, weakness and headaches.  Hematological:  Does not bruise/bleed easily.  All other systems reviewed and are negative.     Objective:   Physical Exam Vitals and nursing note reviewed.  Constitutional:      Appearance: Normal appearance. He is well-developed.  HENT:      Head: Normocephalic.     Nose: Nose normal.  Eyes:     Pupils: Pupils are equal, round, and reactive to light.  Neck:     Thyroid: No thyroid mass or thyromegaly.     Vascular: No carotid bruit or JVD.     Trachea: Phonation normal.  Cardiovascular:     Rate and Rhythm: Normal rate and regular rhythm.  Pulmonary:     Effort: Pulmonary effort is normal. No respiratory distress.     Breath sounds: Normal breath sounds.  Abdominal:     General: Bowel sounds are normal.     Palpations: Abdomen is soft.     Tenderness: There is no abdominal tenderness.  Musculoskeletal:        General: Normal range of motion.     Cervical back: Normal range of motion and neck  supple.     Comments:  Wheel chair- unable to get on exam table.  Lymphadenopathy:     Cervical: No cervical adenopathy.  Skin:    General: Skin is warm and dry.  Neurological:     Mental Status: He is alert and oriented to person, place, and time.  Psychiatric:        Behavior: Behavior normal.        Thought Content: Thought content normal.        Judgment: Judgment normal.    BP (!) 162/99   Pulse 81   Temp 98.4 F (36.9 C) (Temporal)   Resp 20   SpO2 96%         Assessment & Plan:  JORDANI NUNN comes in today with chief complaint of Medical Management of Chronic Issues   Diagnosis and orders addressed:  1. Essential hypertension Low sodium diet - CBC with Differential/Platelet - CMP14+EGFR - hydrochlorothiazide (HYDRODIURIL) 25 MG tablet; Take 1 tablet (25 mg total) by mouth daily.  Dispense: 90 tablet; Refill: 1 - losartan (COZAAR) 100 MG tablet; TAKE ONE TABLET (100 MG DOSE) BY MOUTH DAILY  Dispense: 90 tablet; Refill: 1  2. PVD (peripheral vascular disease) ([place])  3. Gastroesophageal reflux disease, unspecified whether esophagitis present Avoid spicy foods Do not eat 2 hours prior to bedtime  4. Paraplegic spinal paralysis ([place]) Use braces to stand to relieve pressure off of sacral area -  clopidogrel (PLAVIX) 75 MG tablet; Take 1 tablet (75 mg total) by mouth daily with breakfast.  Dispense: 90 tablet; Refill: 1  5. Hyperlipidemia with target LDL less than 100 Low fat diet - Lipid panel - atorvastatin (LIPITOR) 10 MG tablet; Take 1 tablet (10 mg total) by mouth daily at 6 PM.  Dispense: 90 tablet; Refill: 1  6. Low testosterone Continue testosterone  7. Tobacco smoker within last 12 months Do not pick up cigarettes again  8. Gastroesophageal reflux disease with esophagitis and hemorrhage Avoid spicy foods Do not eat 2 hours prior to bedtime  - pantoprazole (PROTONIX) 40 MG tablet; Take 1 tablet (40 mg total) by mouth daily.  Dispense: 90 tablet; Refill: 1   Labs pending Health Maintenance reviewed Diet and exercise encouraged  Follow up plan: 6 months   [name] Hassell Done, FNP

## 2020-08-07 NOTE — Patient Instructions (Signed)

## 2020-08-08 LAB — CMP14+EGFR
ALT: 11 IU/L (ref 0–44)
AST: 15 IU/L (ref 0–40)
Albumin/Globulin Ratio: 1.2 (ref 1.2–2.2)
Albumin: 3.7 g/dL — ABNORMAL LOW (ref 3.8–4.8)
Alkaline Phosphatase: 93 IU/L (ref 44–121)
BUN/Creatinine Ratio: 14 (ref 10–24)
BUN: 8 mg/dL (ref 8–27)
Bilirubin Total: 0.3 mg/dL (ref 0.0–1.2)
CO2: 24 mmol/L (ref 20–29)
Calcium: 8.9 mg/dL (ref 8.6–10.2)
Chloride: 105 mmol/L (ref 96–106)
Creatinine, Ser: 0.57 mg/dL — ABNORMAL LOW (ref 0.76–1.27)
Globulin, Total: 3.1 g/dL (ref 1.5–4.5)
Glucose: 100 mg/dL — ABNORMAL HIGH (ref 65–99)
Potassium: 4.5 mmol/L (ref 3.5–5.2)
Sodium: 142 mmol/L (ref 134–144)
Total Protein: 6.8 g/dL (ref 6.0–8.5)
eGFR: 106 mL/min/{1.73_m2} (ref >59)

## 2020-08-08 LAB — LIPID PANEL
Chol/HDL Ratio: 4.9 ratio (ref 0.0–5.0)
Cholesterol, Total: 168 mg/dL (ref 100–199)
HDL: 34 mg/dL — ABNORMAL LOW (ref >39)
LDL Chol Calc (NIH): 111 mg/dL — ABNORMAL HIGH (ref 0–99)
Triglycerides: 127 mg/dL (ref 0–149)
VLDL Cholesterol Cal: 23 mg/dL (ref 5–40)

## 2020-08-08 LAB — CBC WITH DIFFERENTIAL/PLATELET
Basophils Absolute: 0.1 10*3/uL (ref 0.0–0.2)
Basos: 1 %
EOS (ABSOLUTE): 0.2 10*3/uL (ref 0.0–0.4)
Eos: 1 %
Hematocrit: 41.8 % (ref 37.5–51.0)
Hemoglobin: 13.6 g/dL (ref 13.0–17.7)
Immature Grans (Abs): 0 10*3/uL (ref 0.0–0.1)
Immature Granulocytes: 0 %
Lymphocytes Absolute: 4.4 10*3/uL — ABNORMAL HIGH (ref 0.7–3.1)
Lymphs: 34 %
MCH: 28 pg (ref 26.6–33.0)
MCHC: 32.5 g/dL (ref 31.5–35.7)
MCV: 86 fL (ref 79–97)
Monocytes Absolute: 1 10*3/uL — ABNORMAL HIGH (ref 0.1–0.9)
Monocytes: 7 %
Neutrophils Absolute: 7.2 10*3/uL — ABNORMAL HIGH (ref 1.4–7.0)
Neutrophils: 57 %
Platelets: 365 10*3/uL (ref 150–450)
RBC: 4.85 x10E6/uL (ref 4.14–5.80)
RDW: 14.7 % (ref 11.6–15.4)
WBC: 12.9 10*3/uL — ABNORMAL HIGH (ref 3.4–10.8)

## 2020-08-15 ENCOUNTER — Other Ambulatory Visit: Payer: Self-pay | Admitting: Nurse Practitioner

## 2020-08-15 DIAGNOSIS — N319 Neuromuscular dysfunction of bladder, unspecified: Secondary | ICD-10-CM | POA: Diagnosis not present

## 2020-08-15 DIAGNOSIS — N390 Urinary tract infection, site not specified: Secondary | ICD-10-CM | POA: Diagnosis not present

## 2020-08-15 DIAGNOSIS — G8220 Paraplegia, unspecified: Secondary | ICD-10-CM

## 2020-08-15 DIAGNOSIS — R339 Retention of urine, unspecified: Secondary | ICD-10-CM | POA: Diagnosis not present

## 2020-08-16 DIAGNOSIS — G8220 Paraplegia, unspecified: Secondary | ICD-10-CM | POA: Diagnosis not present

## 2020-08-16 DIAGNOSIS — S24103A Unspecified injury at T7-T10 level of thoracic spinal cord, initial encounter: Secondary | ICD-10-CM | POA: Diagnosis not present

## 2020-08-28 DIAGNOSIS — Z7982 Long term (current) use of aspirin: Secondary | ICD-10-CM | POA: Diagnosis not present

## 2020-08-28 DIAGNOSIS — I739 Peripheral vascular disease, unspecified: Secondary | ICD-10-CM | POA: Diagnosis not present

## 2020-08-28 DIAGNOSIS — Z720 Tobacco use: Secondary | ICD-10-CM | POA: Diagnosis not present

## 2020-08-28 DIAGNOSIS — I10 Essential (primary) hypertension: Secondary | ICD-10-CM | POA: Diagnosis not present

## 2020-08-28 DIAGNOSIS — G8220 Paraplegia, unspecified: Secondary | ICD-10-CM | POA: Diagnosis not present

## 2020-08-28 DIAGNOSIS — K592 Neurogenic bowel, not elsewhere classified: Secondary | ICD-10-CM | POA: Diagnosis not present

## 2020-08-28 DIAGNOSIS — E7800 Pure hypercholesterolemia, unspecified: Secondary | ICD-10-CM | POA: Diagnosis not present

## 2020-08-28 DIAGNOSIS — S24104S Unspecified injury at T11-T12 level of thoracic spinal cord, sequela: Secondary | ICD-10-CM | POA: Diagnosis not present

## 2020-08-28 DIAGNOSIS — L89520 Pressure ulcer of left ankle, unstageable: Secondary | ICD-10-CM | POA: Diagnosis not present

## 2020-08-28 DIAGNOSIS — L89512 Pressure ulcer of right ankle, stage 2: Secondary | ICD-10-CM | POA: Diagnosis not present

## 2020-08-28 DIAGNOSIS — K219 Gastro-esophageal reflux disease without esophagitis: Secondary | ICD-10-CM | POA: Diagnosis not present

## 2020-08-28 DIAGNOSIS — Z7902 Long term (current) use of antithrombotics/antiplatelets: Secondary | ICD-10-CM | POA: Diagnosis not present

## 2020-08-28 DIAGNOSIS — L89152 Pressure ulcer of sacral region, stage 2: Secondary | ICD-10-CM | POA: Diagnosis not present

## 2020-08-29 ENCOUNTER — Other Ambulatory Visit: Payer: Self-pay | Admitting: Nurse Practitioner

## 2020-09-15 DIAGNOSIS — S24103A Unspecified injury at T7-T10 level of thoracic spinal cord, initial encounter: Secondary | ICD-10-CM | POA: Diagnosis not present

## 2020-09-15 DIAGNOSIS — G8220 Paraplegia, unspecified: Secondary | ICD-10-CM | POA: Diagnosis not present

## 2020-09-26 ENCOUNTER — Encounter: Payer: Self-pay | Admitting: Physical Medicine and Rehabilitation

## 2020-09-26 ENCOUNTER — Encounter: Payer: PPO | Attending: Physical Medicine and Rehabilitation | Admitting: Physical Medicine and Rehabilitation

## 2020-09-26 ENCOUNTER — Other Ambulatory Visit: Payer: Self-pay

## 2020-09-26 VITALS — BP 154/78 | HR 80 | Temp 98.4°F | Ht 71.0 in | Wt 155.0 lb

## 2020-09-26 DIAGNOSIS — L89153 Pressure ulcer of sacral region, stage 3: Secondary | ICD-10-CM | POA: Insufficient documentation

## 2020-09-26 DIAGNOSIS — G8929 Other chronic pain: Secondary | ICD-10-CM | POA: Diagnosis not present

## 2020-09-26 DIAGNOSIS — M545 Low back pain, unspecified: Secondary | ICD-10-CM | POA: Insufficient documentation

## 2020-09-26 DIAGNOSIS — G8220 Paraplegia, unspecified: Secondary | ICD-10-CM | POA: Diagnosis not present

## 2020-09-26 DIAGNOSIS — Z993 Dependence on wheelchair: Secondary | ICD-10-CM | POA: Diagnosis not present

## 2020-09-26 MED ORDER — TRAMADOL HCL 50 MG PO TABS: 50.0000 mg | ORAL_TABLET | Freq: Four times a day (QID) | ORAL | 5 refills | Status: DC | PRN
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
## 2020-09-28 DIAGNOSIS — T148XXS Other injury of unspecified body region, sequela: Secondary | ICD-10-CM | POA: Diagnosis not present

## 2020-09-28 DIAGNOSIS — K273 Acute peptic ulcer, site unspecified, without hemorrhage or perforation: Secondary | ICD-10-CM | POA: Diagnosis not present

## 2020-09-28 DIAGNOSIS — G6189 Other inflammatory polyneuropathies: Secondary | ICD-10-CM | POA: Diagnosis not present

## 2020-09-28 DIAGNOSIS — Z95820 Peripheral vascular angioplasty status with implants and grafts: Secondary | ICD-10-CM | POA: Diagnosis not present

## 2020-09-28 DIAGNOSIS — E785 Hyperlipidemia, unspecified: Secondary | ICD-10-CM | POA: Diagnosis not present

## 2020-09-28 DIAGNOSIS — G8220 Paraplegia, unspecified: Secondary | ICD-10-CM | POA: Diagnosis not present

## 2020-09-28 DIAGNOSIS — Z993 Dependence on wheelchair: Secondary | ICD-10-CM | POA: Diagnosis not present

## 2020-09-28 DIAGNOSIS — I739 Peripheral vascular disease, unspecified: Secondary | ICD-10-CM | POA: Diagnosis not present

## 2020-10-04 DIAGNOSIS — N319 Neuromuscular dysfunction of bladder, unspecified: Secondary | ICD-10-CM | POA: Diagnosis not present

## 2020-10-04 DIAGNOSIS — N390 Urinary tract infection, site not specified: Secondary | ICD-10-CM | POA: Diagnosis not present

## 2020-10-04 DIAGNOSIS — R339 Retention of urine, unspecified: Secondary | ICD-10-CM | POA: Diagnosis not present

## 2020-10-09 DIAGNOSIS — L89520 Pressure ulcer of left ankle, unstageable: Secondary | ICD-10-CM | POA: Diagnosis not present

## 2020-10-16 DIAGNOSIS — G8220 Paraplegia, unspecified: Secondary | ICD-10-CM | POA: Diagnosis not present

## 2020-10-16 DIAGNOSIS — S24103A Unspecified injury at T7-T10 level of thoracic spinal cord, initial encounter: Secondary | ICD-10-CM | POA: Diagnosis not present

## 2020-10-18 DIAGNOSIS — G8220 Paraplegia, unspecified: Secondary | ICD-10-CM | POA: Diagnosis not present

## 2020-10-27 ENCOUNTER — Other Ambulatory Visit: Payer: Self-pay | Admitting: Nurse Practitioner

## 2020-10-27 DIAGNOSIS — E785 Hyperlipidemia, unspecified: Secondary | ICD-10-CM

## 2020-11-06 DIAGNOSIS — L97329 Non-pressure chronic ulcer of left ankle with unspecified severity: Secondary | ICD-10-CM | POA: Diagnosis not present

## 2020-11-06 DIAGNOSIS — Z79899 Other long term (current) drug therapy: Secondary | ICD-10-CM | POA: Diagnosis not present

## 2020-11-06 DIAGNOSIS — L89512 Pressure ulcer of right ankle, stage 2: Secondary | ICD-10-CM | POA: Diagnosis not present

## 2020-11-06 DIAGNOSIS — I10 Essential (primary) hypertension: Secondary | ICD-10-CM | POA: Diagnosis not present

## 2020-11-06 DIAGNOSIS — K219 Gastro-esophageal reflux disease without esophagitis: Secondary | ICD-10-CM | POA: Diagnosis not present

## 2020-11-06 DIAGNOSIS — E7800 Pure hypercholesterolemia, unspecified: Secondary | ICD-10-CM | POA: Diagnosis not present

## 2020-11-06 DIAGNOSIS — L89152 Pressure ulcer of sacral region, stage 2: Secondary | ICD-10-CM | POA: Diagnosis not present

## 2020-11-06 DIAGNOSIS — N319 Neuromuscular dysfunction of bladder, unspecified: Secondary | ICD-10-CM | POA: Diagnosis not present

## 2020-11-06 DIAGNOSIS — L97511 Non-pressure chronic ulcer of other part of right foot limited to breakdown of skin: Secondary | ICD-10-CM | POA: Diagnosis not present

## 2020-11-06 DIAGNOSIS — I739 Peripheral vascular disease, unspecified: Secondary | ICD-10-CM | POA: Diagnosis not present

## 2020-11-06 DIAGNOSIS — K592 Neurogenic bowel, not elsewhere classified: Secondary | ICD-10-CM | POA: Diagnosis not present

## 2020-11-06 DIAGNOSIS — G8220 Paraplegia, unspecified: Secondary | ICD-10-CM | POA: Diagnosis not present

## 2020-11-06 DIAGNOSIS — S24101S Unspecified injury at T1 level of thoracic spinal cord, sequela: Secondary | ICD-10-CM | POA: Diagnosis not present

## 2020-11-06 DIAGNOSIS — Z7902 Long term (current) use of antithrombotics/antiplatelets: Secondary | ICD-10-CM | POA: Diagnosis not present

## 2020-11-06 DIAGNOSIS — L89520 Pressure ulcer of left ankle, unstageable: Secondary | ICD-10-CM | POA: Diagnosis not present

## 2020-11-14 ENCOUNTER — Encounter: Payer: Self-pay | Admitting: Family Medicine

## 2020-11-14 ENCOUNTER — Ambulatory Visit (INDEPENDENT_AMBULATORY_CARE_PROVIDER_SITE_OTHER): Payer: PPO | Admitting: Family Medicine

## 2020-11-14 ENCOUNTER — Other Ambulatory Visit: Payer: Self-pay

## 2020-11-14 VITALS — BP 141/70 | HR 77 | Temp 98.3°F

## 2020-11-14 DIAGNOSIS — J029 Acute pharyngitis, unspecified: Secondary | ICD-10-CM | POA: Diagnosis not present

## 2020-11-14 DIAGNOSIS — R5383 Other fatigue: Secondary | ICD-10-CM

## 2020-11-14 DIAGNOSIS — N3000 Acute cystitis without hematuria: Secondary | ICD-10-CM | POA: Diagnosis not present

## 2020-11-14 DIAGNOSIS — R8290 Unspecified abnormal findings in urine: Secondary | ICD-10-CM | POA: Diagnosis not present

## 2020-11-14 LAB — URINALYSIS, ROUTINE W REFLEX MICROSCOPIC
Bilirubin, UA: NEGATIVE
Glucose, UA: NEGATIVE
Nitrite, UA: POSITIVE — AB
Protein,UA: NEGATIVE
Specific Gravity, UA: 1.02 (ref 1.005–1.030)
Urobilinogen, Ur: 1 mg/dL (ref 0.2–1.0)
pH, UA: 6 (ref 5.0–7.5)

## 2020-11-14 LAB — MICROSCOPIC EXAMINATION: Renal Epithel, UA: NONE SEEN /hpf

## 2020-11-14 MED ORDER — CEPHALEXIN 500 MG PO CAPS: 500.0000 mg | ORAL_CAPSULE | Freq: Two times a day (BID) | ORAL | 0 refills | Status: DC

## 2020-11-14 NOTE — Patient Instructions (Signed)
Urinary Tract Infection, Adult A urinary tract infection (UTI) is an infection of any part of the urinary tract. The urinary tract includes the kidneys, ureters, bladder, and urethra. These organs make, store, and get rid of urine in the body. An upper UTI affects the ureters and kidneys. A lower UTI affects the bladder and urethra. What are the causes? Most urinary tract infections are caused by bacteria in your genital area around your urethra, where urine leaves your body. These bacteria grow and cause inflammation of your urinary tract. What increases the risk? You are more likely to develop this condition if: You have a urinary catheter that stays in place. You are not able to control when you urinate or have a bowel movement (incontinence). You are male and you: Use a spermicide or diaphragm for birth control. Have low estrogen levels. Are pregnant. You have certain genes that increase your risk. You are sexually active. You take antibiotic medicines. You have a condition that causes your flow of urine to slow down, such as: An enlarged prostate, if you are male. Blockage in your urethra. A kidney stone. A nerve condition that affects your bladder control (neurogenic bladder). Not getting enough to drink, or not urinating often. You have certain medical conditions, such as: Diabetes. A weak disease-fighting system (immunesystem). Sickle cell disease. Gout. Spinal cord injury. What are the signs or symptoms? Symptoms of this condition include: Needing to urinate right away (urgency). Frequent urination. This may include small amounts of urine each time you urinate. Pain or burning with urination. Blood in the urine. Urine that smells bad or unusual. Trouble urinating. Cloudy urine. Vaginal discharge, if you are male. Pain in the abdomen or the lower back. You may also have: Vomiting or a decreased appetite. Confusion. Irritability or tiredness. A fever or  chills. Diarrhea. The first symptom in older adults may be confusion. In some cases, they may not have any symptoms until the infection has worsened. How is this diagnosed? This condition is diagnosed based on your medical history and a physical exam. You may also have other tests, including: Urine tests. Blood tests. Tests for STIs (sexually transmitted infections). If you have had more than one UTI, a cystoscopy or imaging studies may be done to determine the cause of the infections. How is this treated? Treatment for this condition includes: Antibiotic medicine. Over-the-counter medicines to treat discomfort. Drinking enough water to stay hydrated. If you have frequent infections or have other conditions such as a kidney stone, you may need to see a health care provider who specializes in the urinary tract (urologist). In rare cases, urinary tract infections can cause sepsis. Sepsis is a life-threatening condition that occurs when the body responds to an infection. Sepsis is treated in the hospital with IV antibiotics, fluids, and other medicines. Follow these instructions at home: Medicines Take over-the-counter and prescription medicines only as told by your health care provider. If you were prescribed an antibiotic medicine, take it as told by your health care provider. Do not stop using the antibiotic even if you start to feel better. General instructions Make sure you: Empty your bladder often and completely. Do not hold urine for long periods of time. Empty your bladder after sex. Wipe from front to back after urinating or having a bowel movement if you are male. Use each tissue only one time when you wipe. Drink enough fluid to keep your urine pale yellow. Keep all follow-up visits. This is important. Contact a health care provider   if: Your symptoms do not get better after 1-2 days. Your symptoms go away and then return. Get help right away if: You have severe pain in your  back or your lower abdomen. You have a fever or chills. You have nausea or vomiting. Summary A urinary tract infection (UTI) is an infection of any part of the urinary tract, which includes the kidneys, ureters, bladder, and urethra. Most urinary tract infections are caused by bacteria in your genital area. Treatment for this condition often includes antibiotic medicines. If you were prescribed an antibiotic medicine, take it as told by your health care provider. Do not stop using the antibiotic even if you start to feel better. Keep all follow-up visits. This is important. This information is not intended to replace advice given to you by your health care provider. Make sure you discuss any questions you have with your health care provider. Document Revised: 09/16/2019 Document Reviewed: 09/16/2019 Elsevier Patient Education  2022 [employer]  
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
## 2020-11-15 ENCOUNTER — Encounter (HOSPITAL_COMMUNITY): Payer: PPO

## 2020-11-15 ENCOUNTER — Other Ambulatory Visit: Payer: Self-pay | Admitting: Family Medicine

## 2020-11-15 DIAGNOSIS — U071 COVID-19: Secondary | ICD-10-CM

## 2020-11-15 LAB — NOVEL CORONAVIRUS, NAA: SARS-CoV-2, NAA: DETECTED — AB

## 2020-11-15 LAB — SARS-COV-2, NAA 2 DAY TAT

## 2020-11-15 MED ORDER — MOLNUPIRAVIR EUA 200MG CAPSULE: 4.0000 | ORAL_CAPSULE | Freq: Two times a day (BID) | ORAL | 0 refills | Status: AC

## 2020-11-16 DIAGNOSIS — S24103A Unspecified injury at T7-T10 level of thoracic spinal cord, initial encounter: Secondary | ICD-10-CM | POA: Diagnosis not present

## 2020-11-16 DIAGNOSIS — G8220 Paraplegia, unspecified: Secondary | ICD-10-CM | POA: Diagnosis not present

## 2020-11-23 LAB — URINE CULTURE

## 2020-11-29 DIAGNOSIS — R339 Retention of urine, unspecified: Secondary | ICD-10-CM | POA: Diagnosis not present

## 2020-11-29 DIAGNOSIS — N319 Neuromuscular dysfunction of bladder, unspecified: Secondary | ICD-10-CM | POA: Diagnosis not present

## 2020-11-29 DIAGNOSIS — N390 Urinary tract infection, site not specified: Secondary | ICD-10-CM | POA: Diagnosis not present

## 2020-11-30 ENCOUNTER — Encounter (HOSPITAL_COMMUNITY): Payer: PPO

## 2020-12-04 ENCOUNTER — Other Ambulatory Visit: Payer: Self-pay | Admitting: Nurse Practitioner

## 2020-12-04 DIAGNOSIS — E785 Hyperlipidemia, unspecified: Secondary | ICD-10-CM

## 2020-12-11 DIAGNOSIS — L89512 Pressure ulcer of right ankle, stage 2: Secondary | ICD-10-CM | POA: Diagnosis not present

## 2020-12-11 DIAGNOSIS — L89212 Pressure ulcer of right hip, stage 2: Secondary | ICD-10-CM | POA: Diagnosis not present

## 2020-12-13 ENCOUNTER — Ambulatory Visit: Payer: PPO | Admitting: Vascular Surgery

## 2020-12-16 DIAGNOSIS — S24103A Unspecified injury at T7-T10 level of thoracic spinal cord, initial encounter: Secondary | ICD-10-CM | POA: Diagnosis not present

## 2020-12-16 DIAGNOSIS — G8220 Paraplegia, unspecified: Secondary | ICD-10-CM | POA: Diagnosis not present

## 2020-12-25 DIAGNOSIS — R972 Elevated prostate specific antigen [PSA]: Secondary | ICD-10-CM | POA: Diagnosis not present

## 2021-01-03 ENCOUNTER — Ambulatory Visit (INDEPENDENT_AMBULATORY_CARE_PROVIDER_SITE_OTHER)
Admission: RE | Admit: 2021-01-03 | Discharge: 2021-01-03 | Disposition: A | Discharge status: Home / Self Care | Payer: PPO | Source: Ambulatory Visit | Attending: Vascular Surgery | Admitting: Vascular Surgery

## 2021-01-03 ENCOUNTER — Other Ambulatory Visit: Payer: Self-pay

## 2021-01-03 ENCOUNTER — Ambulatory Visit: Payer: PPO | Admitting: Vascular Surgery

## 2021-01-03 ENCOUNTER — Encounter: Payer: Self-pay | Admitting: Vascular Surgery

## 2021-01-03 ENCOUNTER — Ambulatory Visit (HOSPITAL_COMMUNITY)
Admission: RE | Admit: 2021-01-03 | Discharge: 2021-01-03 | Disposition: A | Discharge status: Home / Self Care | Payer: PPO | Source: Ambulatory Visit | Attending: Vascular Surgery | Admitting: Vascular Surgery

## 2021-01-03 VITALS — BP 156/81 | HR 82 | Temp 97.9°F | Resp 20 | Ht 71.0 in

## 2021-01-03 DIAGNOSIS — L97909 Non-pressure chronic ulcer of unspecified part of unspecified lower leg with unspecified severity: Secondary | ICD-10-CM

## 2021-01-03 DIAGNOSIS — I739 Peripheral vascular disease, unspecified: Secondary | ICD-10-CM

## 2021-01-03 DIAGNOSIS — I70299 Other atherosclerosis of native arteries of extremities, unspecified extremity: Secondary | ICD-10-CM

## 2021-01-03 DIAGNOSIS — I70291 Other atherosclerosis of native arteries of extremities, right leg: Secondary | ICD-10-CM | POA: Diagnosis not present

## 2021-01-03 MED ORDER — CLOPIDOGREL BISULFATE 75 MG PO TABS: 75.0000 mg | ORAL_TABLET | Freq: Every day | ORAL | 11 refills | Status: DC
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
## 2021-01-15 DIAGNOSIS — N312 Flaccid neuropathic bladder, not elsewhere classified: Secondary | ICD-10-CM | POA: Diagnosis not present

## 2021-01-15 DIAGNOSIS — R972 Elevated prostate specific antigen [PSA]: Secondary | ICD-10-CM | POA: Diagnosis not present

## 2021-01-15 DIAGNOSIS — N319 Neuromuscular dysfunction of bladder, unspecified: Secondary | ICD-10-CM | POA: Diagnosis not present

## 2021-01-16 DIAGNOSIS — S24103A Unspecified injury at T7-T10 level of thoracic spinal cord, initial encounter: Secondary | ICD-10-CM | POA: Diagnosis not present

## 2021-01-16 DIAGNOSIS — G8220 Paraplegia, unspecified: Secondary | ICD-10-CM | POA: Diagnosis not present

## 2021-01-17 ENCOUNTER — Telehealth: Payer: Self-pay

## 2021-01-17 NOTE — Telephone Encounter (Signed)
Spoke with patient to update on arrival time for angiogram procedure of 0530 AM on tomorrow at [facility]. Pt verbalized understanding.

## 2021-01-18 ENCOUNTER — Encounter (HOSPITAL_COMMUNITY)
Admission: RE | Disposition: A | Discharge status: Home / Self Care | Payer: Self-pay | Source: Home / Self Care | Attending: Vascular Surgery

## 2021-01-18 ENCOUNTER — Ambulatory Visit (HOSPITAL_COMMUNITY)
Admission: RE | Admit: 2021-01-18 | Discharge: 2021-01-18 | Disposition: A | Discharge status: Home / Self Care | Payer: PPO | Attending: Vascular Surgery | Admitting: Vascular Surgery

## 2021-01-18 ENCOUNTER — Encounter (HOSPITAL_COMMUNITY): Payer: Self-pay | Admitting: Vascular Surgery

## 2021-01-18 ENCOUNTER — Other Ambulatory Visit: Payer: Self-pay

## 2021-01-18 DIAGNOSIS — Z7982 Long term (current) use of aspirin: Secondary | ICD-10-CM | POA: Diagnosis not present

## 2021-01-18 DIAGNOSIS — G8220 Paraplegia, unspecified: Secondary | ICD-10-CM | POA: Diagnosis not present

## 2021-01-18 DIAGNOSIS — Z79899 Other long term (current) drug therapy: Secondary | ICD-10-CM | POA: Diagnosis not present

## 2021-01-18 DIAGNOSIS — I739 Peripheral vascular disease, unspecified: Secondary | ICD-10-CM | POA: Diagnosis present

## 2021-01-18 DIAGNOSIS — I70244 Atherosclerosis of native arteries of left leg with ulceration of heel and midfoot: Secondary | ICD-10-CM | POA: Diagnosis not present

## 2021-01-18 DIAGNOSIS — I70243 Atherosclerosis of native arteries of left leg with ulceration of ankle: Secondary | ICD-10-CM | POA: Insufficient documentation

## 2021-01-18 DIAGNOSIS — L97529 Non-pressure chronic ulcer of other part of left foot with unspecified severity: Secondary | ICD-10-CM | POA: Diagnosis not present

## 2021-01-18 DIAGNOSIS — Z7902 Long term (current) use of antithrombotics/antiplatelets: Secondary | ICD-10-CM | POA: Insufficient documentation

## 2021-01-18 HISTORY — PX: PERIPHERAL VASCULAR INTERVENTION: CATH118257

## 2021-01-18 HISTORY — PX: ABDOMINAL AORTOGRAM W/LOWER EXTREMITY: CATH118223

## 2021-01-18 LAB — POCT I-STAT, CHEM 8
BUN: 13 mg/dL (ref 8–23)
BUN: 9 mg/dL (ref 8–23)
Calcium, Ion: 1.08 mmol/L — ABNORMAL LOW (ref 1.15–1.40)
Calcium, Ion: 1.09 mmol/L — ABNORMAL LOW (ref 1.15–1.40)
Chloride: 104 mmol/L (ref 98–111)
Chloride: 104 mmol/L (ref 98–111)
Creatinine, Ser: 0.5 mg/dL — ABNORMAL LOW (ref 0.61–1.24)
Creatinine, Ser: 0.5 mg/dL — ABNORMAL LOW (ref 0.61–1.24)
Glucose, Bld: 98 mg/dL (ref 70–99)
Glucose, Bld: 99 mg/dL (ref 70–99)
HCT: 40 % (ref 39.0–52.0)
HCT: 43 % (ref 39.0–52.0)
Hemoglobin: 13.6 g/dL (ref 13.0–17.0)
Hemoglobin: 14.6 g/dL (ref 13.0–17.0)
Potassium: 3.4 mmol/L — ABNORMAL LOW (ref 3.5–5.1)
Potassium: 8.4 mmol/L (ref 3.5–5.1)
Sodium: 136 mmol/L (ref 135–145)
Sodium: 138 mmol/L (ref 135–145)
TCO2: 27 mmol/L (ref 22–32)
TCO2: 31 mmol/L (ref 22–32)

## 2021-01-18 LAB — POCT ACTIVATED CLOTTING TIME
Activated Clotting Time: 269 seconds
Activated Clotting Time: 263 seconds
Activated Clotting Time: 287 seconds
Activated Clotting Time: 197 seconds
Activated Clotting Time: 161 seconds

## 2021-01-18 SURGERY — ABDOMINAL AORTOGRAM W/LOWER EXTREMITY
Anesthesia: LOCAL | Laterality: Left

## 2021-01-18 MED ORDER — SODIUM CHLORIDE 0.9% FLUSH: 3.0000 mL | Freq: Two times a day (BID) | INTRAVENOUS | Status: DC

## 2021-01-18 MED ORDER — MIDAZOLAM HCL 2 MG/2ML IJ SOLN
INTRAMUSCULAR | Status: AC
  Filled 2021-01-18: qty 2

## 2021-01-18 MED ORDER — HEPARIN SODIUM (PORCINE) 1000 UNIT/ML IJ SOLN
INTRAMUSCULAR | Status: AC
  Filled 2021-01-18: qty 10

## 2021-01-18 MED ORDER — LIDOCAINE HCL (PF) 1 % IJ SOLN
INTRAMUSCULAR | Status: AC
  Filled 2021-01-18: qty 30

## 2021-01-18 MED ORDER — FENTANYL CITRATE (PF) 100 MCG/2ML IJ SOLN
INTRAMUSCULAR | Status: DC | PRN
  Administered 2021-01-18: 50 ug via INTRAVENOUS

## 2021-01-18 MED ORDER — HEPARIN SODIUM (PORCINE) 1000 UNIT/ML IJ SOLN
INTRAMUSCULAR | Status: DC | PRN
  Administered 2021-01-18: 2000 [IU] via INTRAVENOUS
  Administered 2021-01-18: 7000 [IU] via INTRAVENOUS

## 2021-01-18 MED ORDER — LIDOCAINE HCL (PF) 1 % IJ SOLN
INTRAMUSCULAR | Status: DC | PRN
  Administered 2021-01-18: 12 mL

## 2021-01-18 MED ORDER — ONDANSETRON HCL 4 MG/2ML IJ SOLN: 4.0000 mg | Freq: Four times a day (QID) | INTRAMUSCULAR | Status: DC | PRN

## 2021-01-18 MED ORDER — HEPARIN (PORCINE) IN NACL 1000-0.9 UT/500ML-% IV SOLN
INTRAVENOUS | Status: DC | PRN
  Administered 2021-01-18 (×2): 500 mL

## 2021-01-18 MED ORDER — ACETAMINOPHEN 325 MG PO TABS: 650.0000 mg | ORAL_TABLET | ORAL | Status: DC | PRN

## 2021-01-18 MED ORDER — HYDRALAZINE HCL 20 MG/ML IJ SOLN: 5.0000 mg | INTRAMUSCULAR | Status: DC | PRN

## 2021-01-18 MED ORDER — SODIUM CHLORIDE 0.9% FLUSH: 3.0000 mL | INTRAVENOUS | Status: DC | PRN

## 2021-01-18 MED ORDER — FENTANYL CITRATE (PF) 100 MCG/2ML IJ SOLN
INTRAMUSCULAR | Status: AC
  Filled 2021-01-18: qty 2

## 2021-01-18 MED ORDER — MIDAZOLAM HCL 2 MG/2ML IJ SOLN
INTRAMUSCULAR | Status: DC | PRN
  Administered 2021-01-18 (×2): 1 mg via INTRAVENOUS

## 2021-01-18 MED ORDER — LABETALOL HCL 5 MG/ML IV SOLN: 10.0000 mg | INTRAVENOUS | Status: DC | PRN

## 2021-01-18 MED ORDER — SODIUM CHLORIDE 0.9 % IV SOLN: INTRAVENOUS | Status: DC

## 2021-01-18 MED ORDER — SODIUM CHLORIDE 0.9 % IV SOLN: 250.0000 mL | INTRAVENOUS | Status: DC | PRN

## 2021-01-18 MED ORDER — SODIUM CHLORIDE 0.9 % WEIGHT BASED INFUSION: 1.0000 mL/kg/h | INTRAVENOUS | Status: DC

## 2021-01-18 MED ORDER — HEPARIN (PORCINE) IN NACL 1000-0.9 UT/500ML-% IV SOLN
INTRAVENOUS | Status: AC
  Filled 2021-01-18: qty 1000

## 2021-01-18 MED ORDER — IODIXANOL 320 MG/ML IV SOLN
INTRAVENOUS | Status: DC | PRN
  Administered 2021-01-18: 140 mL

## 2021-01-18 SURGICAL SUPPLY — 27 items
BALLN MUSTANG 5X150X135 (BALLOONS) ×2
BALLN STERLING SL 4X100X150 (BALLOONS) ×2
BALLOON MUSTANG 5X150X135 (BALLOONS) ×1 IMPLANT
BALLOON STERLING SL 4X100X150 (BALLOONS) ×1 IMPLANT
CATH ANGIO 5F PIGTAIL 65CM (CATHETERS) ×2 IMPLANT
CATH CROSS OVER TEMPO 5F (CATHETERS) ×2 IMPLANT
CATH STRAIGHT 5FR 65CM (CATHETERS) ×2 IMPLANT
CATH TEMPO AQUA 5F 100CM (CATHETERS) ×2 IMPLANT
GLIDEWIRE NITREX 0.018X80X5 (WIRE) ×4
GUIDEWIRE NITREX 0.018X80X5 (WIRE) ×2 IMPLANT
KIT ENCORE 26 ADVANTAGE (KITS) ×2 IMPLANT
KIT MICROPUNCTURE NIT STIFF (SHEATH) ×2 IMPLANT
KIT PV (KITS) ×2 IMPLANT
SHEATH PINNACLE 5F 10CM (SHEATH) ×2 IMPLANT
SHEATH PINNACLE ST 6F 45CM (SHEATH) ×2 IMPLANT
SHEATH PROBE COVER 6X72 (BAG) ×2 IMPLANT
STENT ELUVIA 6X150X130 (Permanent Stent) ×2 IMPLANT
STENT OMNILINK ELITE 7X29X80 (Permanent Stent) ×2 IMPLANT
SYR MEDRAD MARK V 150ML (SYRINGE) ×2 IMPLANT
TAPE VIPERTRACK RADIOPAQ (MISCELLANEOUS) ×1 IMPLANT
TAPE VIPERTRACK RADIOPAQUE (MISCELLANEOUS) ×2
TRANSDUCER W/STOPCOCK (MISCELLANEOUS) ×2 IMPLANT
TRAY PV CATH (CUSTOM PROCEDURE TRAY) ×2 IMPLANT
TUBING CIL FLEX 10 FLL-RA (TUBING) ×2 IMPLANT
WIRE G V18X300CM (WIRE) ×2 IMPLANT
WIRE HITORQ VERSACORE ST 145CM (WIRE) ×2 IMPLANT
WIRE VERSACORE LOC 115CM (WIRE) ×2 IMPLANT

## 2021-01-18 NOTE — Op Note (Signed)
PATIENT: Dustin Medina      MRN: [id number] DOB: [birth year]    DATE OF PROCEDURE: 01/18/2021  INDICATIONS:    [name] is a 70 y.o. male who presented with a large wound on the lateral malleolus measuring 3 cm x 1.5 cm.  Had evidence of multilevel arterial occlusive disease.  This was clearly a limb threatening situation.  Of note he is paraplegic.  He had a previous situation on the right side and the wound on the right leg healed after endovascular revascularization.  He is on aspirin, Plavix, and a statin.  PROCEDURE:    Conscious sedation Ultrasound-guided access to the right common femoral artery Aortogram with bilateral iliac arteriogram Selective catheterization of the left superficial femoral artery (third order catheterization) Left lower extremity arteriogram Angioplasty and stenting of the left superficial femoral artery  Angioplasty and stenting of the left common iliac artery  SURGEON: Judeth Cornfield. Scot Dock, MD, FACS  ANESTHESIA: Local with sedation  EBL: Minimal  TECHNIQUE: The patient was brought to the peripheral vascular lab and was sedated. The period of conscious sedation was 111 minutes.  During that time period, I was present face-to-face 100% of the time.  The patient was administered 1 mg of Versed and 50 mcg of fentanyl initially.. The patient's heart rate, blood pressure, and oxygen saturation were monitored by the nurse continuously during the procedure.  Both groins were prepped and draped in the usual sterile fashion.  Under ultrasound guidance, after the skin was anesthetized, I cannulated the right common femoral artery with a micropuncture needle and a micropuncture sheath was introduced over a wire.  This was exchanged for a 5 [place] sheath over a Bentson wire.  By ultrasound the femoral artery was patent. A real-time image was obtained and sent to the server.  The pigtail catheter was positioned at the L1 vertebral body and flush aortogram  obtained.  I then exchanged the pigtail catheter for a crossover catheter which was positioned in the left common iliac artery.  I then advanced the wire into the external iliac artery and exchanged the crossover catheter for a straight catheter.  I was able to get the versa core wire down into the SFA and then advance the catheter over the wire for selective left lower extremity runoff.  The patient had multilevel disease which I felt could be addressed with angioplasty and stenting.  I exchanged the 5 French sheath in the right groin for a 6 French 45 cm destination sheath which was positioned into the proximal superficial femoral artery.  The patient was heparinized and ACT was monitored throughout the procedure.  I was able to cross the lesion in the SFA which was a long segment stenosis producing a 80 to 90% stenosis.  I initially dilated this with a 5 mm x 10 cm balloon.  There was some dissection present and I did not think that this would resolve with a drug-coated balloon and therefore elected to stent the segment.  I selected a 6 x 150 Luvenia self-expanding stent.  This was positioned across the stenosis and deployed without difficulty.  Postdilatation was done with a 5 mm balloon.  Completion film showed an excellent result.  In addition after SFA angioplasty and stenting I did shoot the runoff again which showed no evidence of embolic disease.  And then went on to address the common iliac artery stenosis.  The sheath was retracted to the proximal common iliac artery and an additional image was obtained.  Upon better visualization it appeared that the external iliac artery stenosis with was not significant.  In addition there was disease in both hypogastric arteries and I felt the safest option was to address the common iliac artery stenosis which was significant (70% stenosis).  I selected a 7 x 20 5 Omnilink balloon expandable stent which was positioned across the stenosis and deployed without  difficulty.  Completion film showed no residual stenosis.  I then retracted the sheath into the right external iliac artery.  The patient was transferred to the holding area for removal of the sheath.  No immediate complications were noted.  FINDINGS:   There is no significant disease of the infrarenal aorta.  Renal arteries are patent without significant renal artery stenosis identified. On the right side the common and external iliac arteries are patent where he had a previous stent.  The hypogastric artery is diseased but patent. On the left side, which is the side of concern, the common iliac artery has a 70% stenosis over a length of about 2 cm and this was successfully addressed with angioplasty and stenting as described above. Below that the hypogastric artery is patent although disease.  The external iliac artery has mild stenosis proximally. The common femoral and deep femoral artery are patent.  The superficial femoral artery is patent proximally.  There was moderate diffuse disease throughout the mid segment of the SFA down to the adductor canal producing up to an 80% stenosis.  This was successfully addressed with angioplasty and stenting as described above. There was three-vessel runoff on the left via the anterior tibial, posterior tibial, and peroneal arteries.  TASC Classification  Largest Sheath Size: 6 [place]  Target vessel: Left common iliac artery and left superficial femoral artery  % Stenosis: COMMON ILIAC ARTERY:  Pre 70%. Post 0% LEFT SUPERFICIAL FEMORAL ARTERY: Pre-80%, post 0%.  Lesion length: Common iliac artery 2 cm, superficial femoral artery 10 cm  Calcification: Yes  Most impactful devices used (Up to 3): Common iliac artery Omnilink balloon expandable stent (7 x 25), superficial femoral artery Aluvea 6 x 150 self-expanding stent  Outflow: Disease present or not distal to the lesion treated and the  Flow in the distal vessel:  no  CLINICAL NOTE: The  patient will return to the office in 6 weeks to manage the wound on the left lateral malleolus.  He had a good result on the opposite side although this wound is fairly extensive and certainly he is still at risk for limb loss.  [name], MD, FACS [facility]  DATE OF DICTATION:   01/18/2021

## 2021-01-18 NOTE — Progress Notes (Signed)
Pt sat up in bed and pivoted to wheelchair without difficulty or bleeding.   Discharged home with brother who will drive and stay with pt x 24 hrs
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
## 2021-01-18 NOTE — Progress Notes (Signed)
58fr sheath removed from right groin area using manual pressure at 1229 hrs.  Hemostasis achieved at 1250 hrs. Tegaderm and gauze dressing applied to right groin area.  Distal pulses intact.  [employer]

## 2021-01-22 DIAGNOSIS — L89520 Pressure ulcer of left ankle, unstageable: Secondary | ICD-10-CM | POA: Diagnosis not present

## 2021-01-22 DIAGNOSIS — I10 Essential (primary) hypertension: Secondary | ICD-10-CM | POA: Diagnosis not present

## 2021-01-22 DIAGNOSIS — L89212 Pressure ulcer of right hip, stage 2: Secondary | ICD-10-CM | POA: Diagnosis not present

## 2021-01-22 DIAGNOSIS — E7800 Pure hypercholesterolemia, unspecified: Secondary | ICD-10-CM | POA: Diagnosis not present

## 2021-01-22 DIAGNOSIS — I739 Peripheral vascular disease, unspecified: Secondary | ICD-10-CM | POA: Diagnosis not present

## 2021-01-22 DIAGNOSIS — L89152 Pressure ulcer of sacral region, stage 2: Secondary | ICD-10-CM | POA: Diagnosis not present

## 2021-01-22 DIAGNOSIS — Z7902 Long term (current) use of antithrombotics/antiplatelets: Secondary | ICD-10-CM | POA: Diagnosis not present

## 2021-01-22 DIAGNOSIS — Z79899 Other long term (current) drug therapy: Secondary | ICD-10-CM | POA: Diagnosis not present

## 2021-01-23 DIAGNOSIS — L97519 Non-pressure chronic ulcer of other part of right foot with unspecified severity: Secondary | ICD-10-CM | POA: Diagnosis not present

## 2021-01-23 DIAGNOSIS — R339 Retention of urine, unspecified: Secondary | ICD-10-CM | POA: Diagnosis not present

## 2021-01-23 DIAGNOSIS — L89523 Pressure ulcer of left ankle, stage 3: Secondary | ICD-10-CM | POA: Diagnosis not present

## 2021-01-23 DIAGNOSIS — E7800 Pure hypercholesterolemia, unspecified: Secondary | ICD-10-CM | POA: Diagnosis not present

## 2021-01-23 DIAGNOSIS — N319 Neuromuscular dysfunction of bladder, unspecified: Secondary | ICD-10-CM | POA: Diagnosis not present

## 2021-01-23 DIAGNOSIS — L89224 Pressure ulcer of left hip, stage 4: Secondary | ICD-10-CM | POA: Diagnosis not present

## 2021-01-23 DIAGNOSIS — I739 Peripheral vascular disease, unspecified: Secondary | ICD-10-CM | POA: Diagnosis not present

## 2021-01-23 DIAGNOSIS — L97529 Non-pressure chronic ulcer of other part of left foot with unspecified severity: Secondary | ICD-10-CM | POA: Diagnosis not present

## 2021-01-23 DIAGNOSIS — M549 Dorsalgia, unspecified: Secondary | ICD-10-CM | POA: Diagnosis not present

## 2021-01-23 DIAGNOSIS — G8929 Other chronic pain: Secondary | ICD-10-CM | POA: Diagnosis not present

## 2021-01-23 DIAGNOSIS — L89214 Pressure ulcer of right hip, stage 4: Secondary | ICD-10-CM | POA: Diagnosis not present

## 2021-01-23 DIAGNOSIS — L89524 Pressure ulcer of left ankle, stage 4: Secondary | ICD-10-CM | POA: Diagnosis not present

## 2021-01-23 DIAGNOSIS — N390 Urinary tract infection, site not specified: Secondary | ICD-10-CM | POA: Diagnosis not present

## 2021-01-23 DIAGNOSIS — I10 Essential (primary) hypertension: Secondary | ICD-10-CM | POA: Diagnosis not present

## 2021-01-25 ENCOUNTER — Ambulatory Visit: Payer: PPO | Admitting: Nurse Practitioner

## 2021-01-25 ENCOUNTER — Encounter: Payer: PPO | Admitting: Physical Medicine and Rehabilitation

## 2021-01-28 ENCOUNTER — Other Ambulatory Visit: Payer: Self-pay | Admitting: Nurse Practitioner

## 2021-01-29 DIAGNOSIS — L89212 Pressure ulcer of right hip, stage 2: Secondary | ICD-10-CM | POA: Diagnosis not present

## 2021-01-29 DIAGNOSIS — L89152 Pressure ulcer of sacral region, stage 2: Secondary | ICD-10-CM | POA: Diagnosis not present

## 2021-01-29 DIAGNOSIS — L89520 Pressure ulcer of left ankle, unstageable: Secondary | ICD-10-CM | POA: Diagnosis not present

## 2021-01-30 DIAGNOSIS — L89512 Pressure ulcer of right ankle, stage 2: Secondary | ICD-10-CM | POA: Diagnosis not present

## 2021-01-31 ENCOUNTER — Other Ambulatory Visit: Payer: Self-pay | Admitting: Nurse Practitioner

## 2021-01-31 DIAGNOSIS — G8220 Paraplegia, unspecified: Secondary | ICD-10-CM

## 2021-02-07 ENCOUNTER — Encounter: Payer: Self-pay | Admitting: Nurse Practitioner

## 2021-02-07 ENCOUNTER — Ambulatory Visit (INDEPENDENT_AMBULATORY_CARE_PROVIDER_SITE_OTHER): Payer: PPO | Admitting: Nurse Practitioner

## 2021-02-07 VITALS — BP 158/92 | HR 82 | Temp 97.1°F | Resp 20

## 2021-02-07 DIAGNOSIS — R7989 Other specified abnormal findings of blood chemistry: Secondary | ICD-10-CM | POA: Diagnosis not present

## 2021-02-07 DIAGNOSIS — I10 Essential (primary) hypertension: Secondary | ICD-10-CM

## 2021-02-07 DIAGNOSIS — G8220 Paraplegia, unspecified: Secondary | ICD-10-CM | POA: Diagnosis not present

## 2021-02-07 DIAGNOSIS — K219 Gastro-esophageal reflux disease without esophagitis: Secondary | ICD-10-CM | POA: Diagnosis not present

## 2021-02-07 DIAGNOSIS — Z993 Dependence on wheelchair: Secondary | ICD-10-CM

## 2021-02-07 DIAGNOSIS — F17200 Nicotine dependence, unspecified, uncomplicated: Secondary | ICD-10-CM

## 2021-02-07 DIAGNOSIS — K2101 Gastro-esophageal reflux disease with esophagitis, with bleeding: Secondary | ICD-10-CM | POA: Diagnosis not present

## 2021-02-07 DIAGNOSIS — I739 Peripheral vascular disease, unspecified: Secondary | ICD-10-CM | POA: Diagnosis not present

## 2021-02-07 DIAGNOSIS — E785 Hyperlipidemia, unspecified: Secondary | ICD-10-CM | POA: Diagnosis not present

## 2021-02-07 MED ORDER — PANTOPRAZOLE SODIUM 40 MG PO TBEC: 40.0000 mg | DELAYED_RELEASE_TABLET | Freq: Every day | ORAL | 1 refills | Status: DC

## 2021-02-07 MED ORDER — CLOPIDOGREL BISULFATE 75 MG PO TABS
75.0000 mg | ORAL_TABLET | Freq: Every day | ORAL | 11 refills | Status: DC
Start: 2021-02-07 — End: 2021-08-13

## 2021-02-07 MED ORDER — ATORVASTATIN CALCIUM 10 MG PO TABS: 10.0000 mg | ORAL_TABLET | Freq: Every day | ORAL | 1 refills | Status: DC

## 2021-02-07 MED ORDER — AMLODIPINE BESYLATE 10 MG PO TABS: 10.0000 mg | ORAL_TABLET | Freq: Every day | ORAL | 1 refills | Status: DC

## 2021-02-07 MED ORDER — HYDROCHLOROTHIAZIDE 25 MG PO TABS: 25.0000 mg | ORAL_TABLET | Freq: Every day | ORAL | 1 refills | Status: DC

## 2021-02-07 MED ORDER — LOSARTAN POTASSIUM 100 MG PO TABS: ORAL_TABLET | ORAL | 1 refills | Status: DC

## 2021-02-07 NOTE — Progress Notes (Signed)
Subjective:    Patient ID: Dustin Medina, male    DOB: 1950/08/23, 70 y.o.   MRN: [id number]   Chief Complaint: No chief complaint on file.    HPI:  Dustin Medina is a 70 y.o. who identifies as a male who was assigned male at birth.   Social history: Lives with: by himself Work history: on disability   Comes in today for follow up of the following chronic medical issues:  1. Essential hypertension No c/o chest pain, sob oir headache. Does not check blood pressure at home BP Readings from Last 3 Encounters:  01/18/21 (!) 147/89  01/03/21 (!) 156/81  11/14/20 (!) 141/70     2. PVD (peripheral vascular disease) ([place]) Has very poor circulation in lower ext. Not able to prop legs up when sitting. He has pressure ulcers on ankles. Had recent ABDOMINAL AORTOGRAM W/LOWER EXTREMITY.  They put 2 stents in left like and debrided pressure ulcers.  3. Gastroesophageal reflux disease, unspecified whether esophagitis present Is on protonix daily and is doing well.  4. Paraplegic spinal paralysis ([place]) Has spasms at times. Has full upper body use  5. Wheelchair dependence He is dependent on wheel chair to get around. He is able to move hisself to and from wheel chair  6. Hyperlipidemia with target LDL less than 100 Does not watch diet and does no dedicated exercise. Lab Results  Component Value Date   CHOL 168 08/07/2020   HDL 34 (L) 08/07/2020   LDLCALC 111 (H) 08/07/2020   TRIG 127 08/07/2020   CHOLHDL 4.9 08/07/2020     7. Low testosterone Lab Results  Component Value Date   TESTOSTERONE 286.7 (L) 06/20/2013     8. Tobacco smoker within last 12 months Quit smoking several months ago.   New complaints: None today  No Known Allergies Outpatient Encounter Medications as of 02/07/2021  Medication Sig   amLODipine (NORVASC) 10 MG tablet TAKE 1 TABLET BY MOUTH EVERY DAY   atorvastatin (LIPITOR) 10 MG tablet Take 1 tablet (10 mg total) by mouth daily at 6 PM.    clopidogrel (PLAVIX) 75 MG tablet Take 1 tablet (75 mg total) by mouth daily.   cyclobenzaprine (FLEXERIL) 10 MG tablet Take 10 mg by mouth daily as needed for muscle spasms.   hydrochlorothiazide (HYDRODIURIL) 25 MG tablet Take 1 tablet (25 mg total) by mouth daily.   ibuprofen (ADVIL) 200 MG tablet Take 200 mg by mouth every 6 (six) hours as needed for headache or moderate pain.   losartan (COZAAR) 100 MG tablet TAKE ONE TABLET (100 MG DOSE) BY MOUTH DAILY   meloxicam (MOBIC) 15 MG tablet Take 1 tablet by mouth daily.   Multiple Vitamin (MULITIVITAMIN WITH MINERALS) TABS Take 1 tablet by mouth every other day.    Nerve Stimulator (PRO COMFORT TENS ELECTRODES) MISC Apply 2 patches topically daily as needed.   Nerve Stimulator (PRO COMFORT TENS UNIT) DEVI Apply 1 Units topically daily as needed.   niacin (NIASPAN) 1000 MG CR tablet TAKE 1 TABLET (1,000 MG TOTAL) BY MOUTH AT BEDTIME.   pantoprazole (PROTONIX) 40 MG tablet Take 1 tablet (40 mg total) by mouth daily.   promethazine (PHENERGAN) 25 MG tablet Take 1 tablet (25 mg total) by mouth every 8 (eight) hours as needed for nausea or vomiting.   silver sulfADIAZINE (SILVADENE) 1 % cream Apply 1 application topically daily as needed (wound care).    testosterone cypionate (DEPOTESTOSTERONE CYPIONATE) 200 MG/ML injection INJECT 0.75 MLS (  150 MG TOTAL) INTO THE MUSCLE EVERY 14 (FOURTEEN) DAYS.   tiZANidine (ZANAFLEX) 4 MG tablet Take 0.5-1 tablets (2-4 mg total) by mouth 2 (two) times daily. For muscle spasms   traMADol (ULTRAM) 50 MG tablet Take 1 tablet (50 mg total) by mouth every 6 (six) hours as needed. (Patient taking differently: Take 50 mg by mouth in the morning, at noon, in the evening, and at bedtime.)   trimethoprim (TRIMPEX) 100 MG tablet Take 1 tablet (100 mg total) by mouth daily.   No facility-administered encounter medications on file as of 02/07/2021.    Past Surgical History:  Procedure Laterality Date   ABDOMINAL AORTOGRAM  W/LOWER EXTREMITY Bilateral 11/19/2018   Procedure: ABDOMINAL AORTOGRAM W/LOWER EXTREMITY;  Surgeon: Angelia Mould, MD;  Location: [facility];  Service: Cardiovascular;  Laterality: Bilateral;   ABDOMINAL AORTOGRAM W/LOWER EXTREMITY Left 01/18/2021   Procedure: ABDOMINAL AORTOGRAM W/LOWER EXTREMITY;  Surgeon: Angelia Mould, MD;  Location: [facility];  Service: Cardiovascular;  Laterality: Left;   BACK SURGERY  1969   carpal tunnel Right 11/23/13   coloncoscopy  2008   Dr. Laural Golden: few small diverticula at ascending colon and external hemorrhoids, otherwise normal   COLONOSCOPY WITH PROPOFOL N/A 04/16/2017   Procedure: COLONOSCOPY WITH PROPOFOL;  Surgeon: Daneil Dolin, MD;  Location: [facility];  Service: Endoscopy;  Laterality: N/A;  11:15am   dilation of esophagus     ESOPHAGOGASTRODUODENOSCOPY N/A 07/26/2012   [id number] Schatzki's ring. Hiatal hernia, likely upper GI bleed secondary to MW tear   HERNIA REPAIR  02/03/2001   paraplegia and right inguinal hernia   left leg surgery due to staph infection  2005   LIPOMA EXCISION  07/07/2011   Procedure: EXCISION LIPOMA;  Surgeon: Imogene Burn. Georgette Dover, MD;  Location: [facility];  Service: General;  Laterality: Right;   LOWER EXTREMITY ANGIOGRAPHY N/A 07/16/2018   Procedure: LOWER EXTREMITY ANGIOGRAPHY;  Surgeon: Angelia Mould, MD;  Location: [facility];  Service: Cardiovascular;  Laterality: N/A;   MULTIPLE TOOTH EXTRACTIONS     PERIPHERAL VASCULAR INTERVENTION  07/16/2018   Procedure: PERIPHERAL VASCULAR INTERVENTION;  Surgeon: Angelia Mould, MD;  Location: [facility];  Service: Cardiovascular;;  right common iliac   PERIPHERAL VASCULAR INTERVENTION Left 01/18/2021   Procedure: PERIPHERAL VASCULAR INTERVENTION;  Surgeon: Angelia Mould, MD;  Location: [facility];  Service: Cardiovascular;  Laterality: Left;  SFA / common iliac   POLYPECTOMY  04/16/2017   Procedure:  POLYPECTOMY;  Surgeon: Daneil Dolin, MD;  Location: [facility];  Service: Endoscopy;;  colon   SURGERY FOR DECUBITUS ULCER      Family History  Problem Relation Age of Onset   COPD Father    Heart disease Father    Aneurysm Father    Hyperlipidemia Mother    Hypertension Mother    Diabetes Sister    Arrhythmia Daughter    Stroke Sister    Colon cancer Maternal Grandfather    Esophageal cancer Neg Hx       Controlled substance contract: n/a     Review of Systems  Constitutional:  Negative for diaphoresis.  Eyes:  Negative for pain.  Respiratory:  Negative for shortness of breath.   Cardiovascular:  Negative for chest pain, palpitations and leg swelling.  Gastrointestinal:  Negative for abdominal pain.  Endocrine: Negative for polydipsia.  Skin:  Negative for rash.  Neurological:  Negative for dizziness, weakness and headaches.  Hematological:  Does not bruise/bleed easily.  All other systems reviewed and are negative.     Objective:   Physical Exam Vitals and nursing note reviewed.  Constitutional:      Appearance: Normal appearance. He is well-developed.  HENT:     Head: Normocephalic.     Nose: Nose normal.     Mouth/Throat:     Mouth: Mucous membranes are moist.     Pharynx: Oropharynx is clear.  Eyes:     Pupils: Pupils are equal, round, and reactive to light.  Neck:     Thyroid: No thyroid mass or thyromegaly.     Vascular: No carotid bruit or JVD.     Trachea: Phonation normal.  Cardiovascular:     Rate and Rhythm: Normal rate and regular rhythm.  Pulmonary:     Effort: Pulmonary effort is normal. No respiratory distress.     Breath sounds: Normal breath sounds.  Abdominal:     General: Bowel sounds are normal.     Palpations: Abdomen is soft.     Tenderness: There is no abdominal tenderness.  Musculoskeletal:        General: Normal range of motion.     Cervical back: Normal range of motion and neck supple.     Comments:  Wheel chair    Lymphadenopathy:     Cervical: No cervical adenopathy.  Skin:    General: Skin is warm and dry.  Neurological:     Mental Status: He is alert and oriented to person, place, and time.  Psychiatric:        Behavior: Behavior normal.        Thought Content: Thought content normal.        Judgment: Judgment normal.    BP (!) 158/92    Pulse 82    Temp (!) 97.1 F (36.2 C) (Temporal)    Resp 20        Assessment & Plan:  Dustin Medina comes in today with chief complaint of Medical Management of Chronic Issues   Diagnosis and orders addressed:  1. Essential hypertension Low sodium diet - hydrochlorothiazide (HYDRODIURIL) 25 MG tablet; Take 1 tablet (25 mg total) by mouth daily.  Dispense: 90 tablet; Refill: 1 - losartan (COZAAR) 100 MG tablet; TAKE ONE TABLET (100 MG DOSE) BY MOUTH DAILY  Dispense: 90 tablet; Refill: 1 - amLODipine (NORVASC) 10 MG tablet; Take 1 tablet (10 mg total) by mouth daily.  Dispense: 90 tablet; Refill: 1 - CBC with Differential/Platelet - CMP14+EGFR  2. PVD (peripheral vascular disease) ([place]) Keep follow up with peripheral vascular surgeon - clopidogrel (PLAVIX) 75 MG tablet; Take 1 tablet (75 mg total) by mouth daily.  Dispense: 30 tablet; Refill: 11  3. Gastroesophageal reflux disease, unspecified whether esophagitis present Avoid spicy foods Do not eat 2 hours prior to bedtime - pantoprazole (PROTONIX) 40 MG tablet; Take 1 tablet (40 mg total) by mouth daily.  Dispense: 90 tablet; Refill: 1  4. Paraplegic spinal paralysis ([place]) 5. Wheelchair dependence Change positions to help with pressure ulcers  6. Hyperlipidemia with target LDL less than 100 Low fat diet - atorvastatin (LIPITOR) 10 MG tablet; Take 1 tablet (10 mg total) by mouth daily at 6 PM.  Dispense: 90 tablet; Refill: 1 - Lipid panel  7. Low testosterone  8. Tobacco smoker within last 12 months Do not start smoking again    Labs pending Health Maintenance reviewed Diet and  exercise encouraged  Follow up plan: 6 months  [name] Hassell Done, FNP

## 2021-02-07 NOTE — Patient Instructions (Signed)
Pressure Injury °A pressure injury is damage to the skin and underlying tissue that results from pressure being applied to an area of the body. It often affects people who must spend a long time in a bed or chair because of a medical condition. °Pressure injuries usually occur: °Over bony parts of the body, such as the tailbone, shoulders, elbows, hips, heels, spine, ankles, and back of the head. °Under medical devices that make contact with the body, such as respiratory equipment, stockings, tubes, and splints. °Pressure injuries start as reddened areas on the skin and can lead to pain and an open wound. °What are the causes? °This condition is caused by frequent or constant pressure to an area of the body. Decreased blood flow to the skin can eventually cause the skin tissue to die and break down, causing a wound. °What increases the risk? °You are more likely to develop this condition if you: °Are in the hospital or an extended care facility. °Are bedridden or in a wheelchair. °Have an injury or disease that keeps you from: °Moving normally. °Feeling pain or pressure. °Have a condition that: °Makes you sleepy or less alert. °Causes poor blood flow. °Need to wear a medical device. °Have poor control of your bladder or bowel functions (incontinence). °Have poor nutrition (malnutrition). °If you are at risk for pressure injuries, your health care provider may recommend certain types of mattresses, mattress covers, pillows, cushions, or boots to help prevent them. These may include products filled with air, foam, gel, or sand. °What are the signs or symptoms? °Symptoms of this condition depend on the severity of the injury. Symptoms may include: °Red or dark areas of the skin. °Pain, warmth, or a change of skin texture. °Blisters. °An open wound. °How is this diagnosed? °This condition is diagnosed with a medical history and physical exam. You may also have tests, such as: °Blood tests. °Imaging tests. °Blood flow  tests. °Your pressure injury will be staged based on its severity. Staging is based on: °The depth of the tissue injury, including whether there is exposure of muscle, bone, or tendon. °The cause of the pressure injury. °How is this treated? °This condition may be treated by: °Relieving or redistributing pressure on your skin. This includes: °Frequently changing your position. °Avoiding positions that caused the wound or that can make the wound worse. °Using specific bed mattresses, chair cushions, or protective boots. °Moving medical devices from an area of pressure, or placing padding between the skin and the device. °Using foams, creams, or powders to prevent rubbing (friction) on the skin. °Keeping your skin clean and dry. This may include using a skin cleanser or skin barrier as told by your health care provider. °Cleaning your injury and removing any dead tissue from the wound (debridement). °Placing a bandage (dressing) over your injury. °Using medicines for pain or to prevent or treat infection. °Surgery may be needed if other treatments are not working or if your injury is very deep. °Follow these instructions at home: °Wound care °Follow instructions from your health care provider about how to take care of your wound. Make sure you: °Wash your hands with soap and water before and after you change your bandage (dressing). If soap and water are not available, use hand sanitizer. °Change your dressing as told by your health care provider.  °Check your wound every day for signs of infection. Have a caregiver do this for you if you are not able. Check for: °Redness, swelling, or increased pain. °More fluid   or blood. °Warmth. °Pus or a bad smell. °Skin care °Keep your skin clean and dry. Gently pat your skin dry. °Do not rub or massage your skin. °You or a caregiver should check your skin every day for any changes in color or any new blisters or sores (ulcers). °Medicines °Take over-the-counter and prescription  medicines only as told by your health care provider. °If you were prescribed an antibiotic medicine, take or apply it as told by your health care provider. Do not stop using the antibiotic even if your condition improves. °Reducing and redistributing pressure °Do not lie or sit in one position for a long time. Move or change position every 1-2 hours, or as told by your health care provider. °Use pillows or cushions to reduce pressure. Ask your health care provider to recommend cushions or pads for you. °General instructions ° °Eat a healthy diet that includes lots of protein. °Drink enough fluid to keep your urine pale yellow. °Be as active as you can every day. Ask your health care provider to suggest safe exercises or activities. °Do not abuse drugs or alcohol. °Do not use any products that contain nicotine or tobacco, such as cigarettes, e-cigarettes, and chewing tobacco. If you need help quitting, ask your health care provider. °Keep all follow-up visits as told by your health care provider. This is important. °Contact a health care provider if: °You have: °A fever or chills. °Pain that is not helped by medicine. °Any changes in skin color. °New blisters or sores. °Pus or a bad smell coming from your wound. °Redness, swelling, or pain around your wound. °More fluid or blood coming from your wound. °Your wound does not improve after 1-2 weeks of treatment. °Summary °A pressure injury is damage to the skin and underlying tissue that results from pressure being applied to an area of the body. °Do not lie or sit in one position for a long time. Your health care provider may advise you to move or change position every 1-2 hours. °Follow instructions from your health care provider about how to take care of your wound. °Keep all follow-up visits as told by your health care provider. This is important. °This information is not intended to replace advice given to you by your health care provider. Make sure you discuss  any questions you have with your health care provider. °Document Revised: 09/02/2017 Document Reviewed: 09/02/2017 °Elsevier Patient Education © 2022 [employer] ° °

## 2021-02-08 LAB — CMP14+EGFR
ALT: 16 IU/L (ref 0–44)
AST: 20 IU/L (ref 0–40)
Albumin/Globulin Ratio: 1.3 (ref 1.2–2.2)
Albumin: 3.8 g/dL (ref 3.8–4.8)
Alkaline Phosphatase: 124 IU/L — ABNORMAL HIGH (ref 44–121)
BUN/Creatinine Ratio: 38 — ABNORMAL HIGH (ref 10–24)
BUN: 17 mg/dL (ref 8–27)
Bilirubin Total: 0.3 mg/dL (ref 0.0–1.2)
CO2: 26 mmol/L (ref 20–29)
Calcium: 8.9 mg/dL (ref 8.6–10.2)
Chloride: 107 mmol/L — ABNORMAL HIGH (ref 96–106)
Creatinine, Ser: 0.45 mg/dL — ABNORMAL LOW (ref 0.76–1.27)
Globulin, Total: 2.9 g/dL (ref 1.5–4.5)
Glucose: 72 mg/dL (ref 70–99)
Potassium: 5 mmol/L (ref 3.5–5.2)
Sodium: 143 mmol/L (ref 134–144)
Total Protein: 6.7 g/dL (ref 6.0–8.5)
eGFR: 114 mL/min/{1.73_m2} (ref >59)

## 2021-02-08 LAB — CBC WITH DIFFERENTIAL/PLATELET
Basophils Absolute: 0.1 10*3/uL (ref 0.0–0.2)
Basos: 1 %
EOS (ABSOLUTE): 0.2 10*3/uL (ref 0.0–0.4)
Eos: 2 %
Hematocrit: 37.2 % — ABNORMAL LOW (ref 37.5–51.0)
Hemoglobin: 12 g/dL — ABNORMAL LOW (ref 13.0–17.7)
Immature Grans (Abs): 0.1 10*3/uL (ref 0.0–0.1)
Immature Granulocytes: 1 %
Lymphocytes Absolute: 3.6 10*3/uL — ABNORMAL HIGH (ref 0.7–3.1)
Lymphs: 39 %
MCH: 27.6 pg (ref 26.6–33.0)
MCHC: 32.3 g/dL (ref 31.5–35.7)
MCV: 86 fL (ref 79–97)
Monocytes Absolute: 1 10*3/uL — ABNORMAL HIGH (ref 0.1–0.9)
Monocytes: 10 %
Neutrophils Absolute: 4.4 10*3/uL (ref 1.4–7.0)
Neutrophils: 47 %
Platelets: 476 10*3/uL — ABNORMAL HIGH (ref 150–450)
RBC: 4.35 x10E6/uL (ref 4.14–5.80)
RDW: 14.4 % (ref 11.6–15.4)
WBC: 9.3 10*3/uL (ref 3.4–10.8)

## 2021-02-08 LAB — LIPID PANEL
Chol/HDL Ratio: 4.1 ratio (ref 0.0–5.0)
Cholesterol, Total: 158 mg/dL (ref 100–199)
HDL: 39 mg/dL — ABNORMAL LOW (ref >39)
LDL Chol Calc (NIH): 97 mg/dL (ref 0–99)
Triglycerides: 122 mg/dL (ref 0–149)
VLDL Cholesterol Cal: 22 mg/dL (ref 5–40)

## 2021-02-19 DIAGNOSIS — L89523 Pressure ulcer of left ankle, stage 3: Secondary | ICD-10-CM | POA: Diagnosis not present

## 2021-02-19 DIAGNOSIS — L89520 Pressure ulcer of left ankle, unstageable: Secondary | ICD-10-CM | POA: Diagnosis not present

## 2021-02-19 DIAGNOSIS — L89214 Pressure ulcer of right hip, stage 4: Secondary | ICD-10-CM | POA: Diagnosis not present

## 2021-02-19 DIAGNOSIS — I739 Peripheral vascular disease, unspecified: Secondary | ICD-10-CM | POA: Diagnosis not present

## 2021-02-19 DIAGNOSIS — I10 Essential (primary) hypertension: Secondary | ICD-10-CM | POA: Diagnosis not present

## 2021-02-19 DIAGNOSIS — E7800 Pure hypercholesterolemia, unspecified: Secondary | ICD-10-CM | POA: Diagnosis not present

## 2021-02-19 DIAGNOSIS — L89212 Pressure ulcer of right hip, stage 2: Secondary | ICD-10-CM | POA: Diagnosis not present

## 2021-02-19 DIAGNOSIS — L89512 Pressure ulcer of right ankle, stage 2: Secondary | ICD-10-CM | POA: Diagnosis not present

## 2021-02-19 DIAGNOSIS — L89152 Pressure ulcer of sacral region, stage 2: Secondary | ICD-10-CM | POA: Diagnosis not present

## 2021-02-19 DIAGNOSIS — Z4800 Encounter for change or removal of nonsurgical wound dressing: Secondary | ICD-10-CM | POA: Diagnosis not present

## 2021-03-04 ENCOUNTER — Encounter: Payer: Medicare HMO | Attending: Physical Medicine and Rehabilitation | Admitting: Physical Medicine and Rehabilitation

## 2021-03-04 ENCOUNTER — Encounter: Payer: Self-pay | Admitting: Physical Medicine and Rehabilitation

## 2021-03-04 ENCOUNTER — Other Ambulatory Visit: Payer: Self-pay

## 2021-03-04 VITALS — BP 152/91 | HR 85 | Temp 97.8°F

## 2021-03-04 DIAGNOSIS — G8220 Paraplegia, unspecified: Secondary | ICD-10-CM

## 2021-03-04 DIAGNOSIS — Z993 Dependence on wheelchair: Secondary | ICD-10-CM

## 2021-03-04 DIAGNOSIS — R252 Cramp and spasm: Secondary | ICD-10-CM

## 2021-03-04 DIAGNOSIS — L89213 Pressure ulcer of right hip, stage 3: Secondary | ICD-10-CM | POA: Diagnosis not present

## 2021-03-04 DIAGNOSIS — S24104S Unspecified injury at T11-T12 level of thoracic spinal cord, sequela: Secondary | ICD-10-CM

## 2021-03-04 DIAGNOSIS — L89153 Pressure ulcer of sacral region, stage 3: Secondary | ICD-10-CM | POA: Diagnosis not present

## 2021-03-04 MED ORDER — TRAMADOL HCL 50 MG PO TABS: 50.0000 mg | ORAL_TABLET | Freq: Four times a day (QID) | ORAL | 5 refills | Status: DC | PRN
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
## 2021-03-05 DIAGNOSIS — I10 Essential (primary) hypertension: Secondary | ICD-10-CM | POA: Diagnosis not present

## 2021-03-05 DIAGNOSIS — I739 Peripheral vascular disease, unspecified: Secondary | ICD-10-CM | POA: Diagnosis not present

## 2021-03-05 DIAGNOSIS — E7800 Pure hypercholesterolemia, unspecified: Secondary | ICD-10-CM | POA: Diagnosis not present

## 2021-03-05 DIAGNOSIS — L89512 Pressure ulcer of right ankle, stage 2: Secondary | ICD-10-CM | POA: Diagnosis not present

## 2021-03-05 DIAGNOSIS — L89214 Pressure ulcer of right hip, stage 4: Secondary | ICD-10-CM | POA: Diagnosis not present

## 2021-03-05 DIAGNOSIS — L89212 Pressure ulcer of right hip, stage 2: Secondary | ICD-10-CM | POA: Diagnosis not present

## 2021-03-05 DIAGNOSIS — L89523 Pressure ulcer of left ankle, stage 3: Secondary | ICD-10-CM | POA: Diagnosis not present

## 2021-03-05 DIAGNOSIS — L89520 Pressure ulcer of left ankle, unstageable: Secondary | ICD-10-CM | POA: Diagnosis not present

## 2021-03-05 DIAGNOSIS — Z4800 Encounter for change or removal of nonsurgical wound dressing: Secondary | ICD-10-CM | POA: Diagnosis not present

## 2021-03-19 DIAGNOSIS — L89159 Pressure ulcer of sacral region, unspecified stage: Secondary | ICD-10-CM | POA: Diagnosis not present

## 2021-03-19 DIAGNOSIS — Z792 Long term (current) use of antibiotics: Secondary | ICD-10-CM | POA: Diagnosis not present

## 2021-03-19 DIAGNOSIS — L89523 Pressure ulcer of left ankle, stage 3: Secondary | ICD-10-CM | POA: Diagnosis not present

## 2021-03-19 DIAGNOSIS — Z7902 Long term (current) use of antithrombotics/antiplatelets: Secondary | ICD-10-CM | POA: Diagnosis not present

## 2021-03-19 DIAGNOSIS — E7800 Pure hypercholesterolemia, unspecified: Secondary | ICD-10-CM | POA: Diagnosis not present

## 2021-03-19 DIAGNOSIS — Z79899 Other long term (current) drug therapy: Secondary | ICD-10-CM | POA: Diagnosis not present

## 2021-03-19 DIAGNOSIS — L89214 Pressure ulcer of right hip, stage 4: Secondary | ICD-10-CM | POA: Diagnosis not present

## 2021-03-19 DIAGNOSIS — I10 Essential (primary) hypertension: Secondary | ICD-10-CM | POA: Diagnosis not present

## 2021-03-19 DIAGNOSIS — I739 Peripheral vascular disease, unspecified: Secondary | ICD-10-CM | POA: Diagnosis not present

## 2021-03-29 ENCOUNTER — Ambulatory Visit: Payer: PPO | Admitting: Physical Medicine and Rehabilitation

## 2021-04-01 DIAGNOSIS — R339 Retention of urine, unspecified: Secondary | ICD-10-CM | POA: Diagnosis not present

## 2021-04-01 DIAGNOSIS — N319 Neuromuscular dysfunction of bladder, unspecified: Secondary | ICD-10-CM | POA: Diagnosis not present

## 2021-04-01 DIAGNOSIS — N390 Urinary tract infection, site not specified: Secondary | ICD-10-CM | POA: Diagnosis not present

## 2021-04-02 ENCOUNTER — Other Ambulatory Visit: Payer: Self-pay | Admitting: Nurse Practitioner

## 2021-04-02 DIAGNOSIS — L89159 Pressure ulcer of sacral region, unspecified stage: Secondary | ICD-10-CM | POA: Diagnosis not present

## 2021-04-02 DIAGNOSIS — Z7902 Long term (current) use of antithrombotics/antiplatelets: Secondary | ICD-10-CM | POA: Diagnosis not present

## 2021-04-02 DIAGNOSIS — I739 Peripheral vascular disease, unspecified: Secondary | ICD-10-CM | POA: Diagnosis not present

## 2021-04-02 DIAGNOSIS — Z79899 Other long term (current) drug therapy: Secondary | ICD-10-CM | POA: Diagnosis not present

## 2021-04-02 DIAGNOSIS — E7800 Pure hypercholesterolemia, unspecified: Secondary | ICD-10-CM | POA: Diagnosis not present

## 2021-04-02 DIAGNOSIS — L89523 Pressure ulcer of left ankle, stage 3: Secondary | ICD-10-CM | POA: Diagnosis not present

## 2021-04-02 DIAGNOSIS — I10 Essential (primary) hypertension: Secondary | ICD-10-CM | POA: Diagnosis not present

## 2021-04-02 DIAGNOSIS — Z792 Long term (current) use of antibiotics: Secondary | ICD-10-CM | POA: Diagnosis not present

## 2021-04-02 DIAGNOSIS — L89214 Pressure ulcer of right hip, stage 4: Secondary | ICD-10-CM | POA: Diagnosis not present

## 2021-04-02 DIAGNOSIS — E785 Hyperlipidemia, unspecified: Secondary | ICD-10-CM

## 2021-04-08 DIAGNOSIS — Z01818 Encounter for other preprocedural examination: Secondary | ICD-10-CM | POA: Diagnosis not present

## 2021-04-09 DIAGNOSIS — H52209 Unspecified astigmatism, unspecified eye: Secondary | ICD-10-CM | POA: Diagnosis not present

## 2021-04-09 DIAGNOSIS — H5203 Hypermetropia, bilateral: Secondary | ICD-10-CM | POA: Diagnosis not present

## 2021-04-09 DIAGNOSIS — H524 Presbyopia: Secondary | ICD-10-CM | POA: Diagnosis not present

## 2021-04-23 DIAGNOSIS — I739 Peripheral vascular disease, unspecified: Secondary | ICD-10-CM | POA: Diagnosis not present

## 2021-04-23 DIAGNOSIS — L97519 Non-pressure chronic ulcer of other part of right foot with unspecified severity: Secondary | ICD-10-CM | POA: Diagnosis not present

## 2021-04-23 DIAGNOSIS — E7800 Pure hypercholesterolemia, unspecified: Secondary | ICD-10-CM | POA: Diagnosis not present

## 2021-04-23 DIAGNOSIS — I10 Essential (primary) hypertension: Secondary | ICD-10-CM | POA: Diagnosis not present

## 2021-04-23 DIAGNOSIS — G8220 Paraplegia, unspecified: Secondary | ICD-10-CM | POA: Diagnosis not present

## 2021-04-23 DIAGNOSIS — L89523 Pressure ulcer of left ankle, stage 3: Secondary | ICD-10-CM | POA: Diagnosis not present

## 2021-04-23 DIAGNOSIS — Z87891 Personal history of nicotine dependence: Secondary | ICD-10-CM | POA: Diagnosis not present

## 2021-04-23 DIAGNOSIS — Z79899 Other long term (current) drug therapy: Secondary | ICD-10-CM | POA: Diagnosis not present

## 2021-04-23 DIAGNOSIS — Z7902 Long term (current) use of antithrombotics/antiplatelets: Secondary | ICD-10-CM | POA: Diagnosis not present

## 2021-04-23 DIAGNOSIS — G8929 Other chronic pain: Secondary | ICD-10-CM | POA: Diagnosis not present

## 2021-04-23 DIAGNOSIS — L97329 Non-pressure chronic ulcer of left ankle with unspecified severity: Secondary | ICD-10-CM | POA: Diagnosis not present

## 2021-04-23 DIAGNOSIS — L89214 Pressure ulcer of right hip, stage 4: Secondary | ICD-10-CM | POA: Diagnosis not present

## 2021-05-01 DIAGNOSIS — R351 Nocturia: Secondary | ICD-10-CM | POA: Diagnosis not present

## 2021-05-01 DIAGNOSIS — R739 Hyperglycemia, unspecified: Secondary | ICD-10-CM | POA: Diagnosis not present

## 2021-05-01 DIAGNOSIS — Z79899 Other long term (current) drug therapy: Secondary | ICD-10-CM | POA: Diagnosis not present

## 2021-05-01 DIAGNOSIS — D649 Anemia, unspecified: Secondary | ICD-10-CM | POA: Diagnosis not present

## 2021-05-07 DIAGNOSIS — I10 Essential (primary) hypertension: Secondary | ICD-10-CM | POA: Diagnosis not present

## 2021-05-07 DIAGNOSIS — G8929 Other chronic pain: Secondary | ICD-10-CM | POA: Diagnosis not present

## 2021-05-07 DIAGNOSIS — Z87891 Personal history of nicotine dependence: Secondary | ICD-10-CM | POA: Diagnosis not present

## 2021-05-07 DIAGNOSIS — Z7902 Long term (current) use of antithrombotics/antiplatelets: Secondary | ICD-10-CM | POA: Diagnosis not present

## 2021-05-07 DIAGNOSIS — L89214 Pressure ulcer of right hip, stage 4: Secondary | ICD-10-CM | POA: Diagnosis not present

## 2021-05-07 DIAGNOSIS — L97329 Non-pressure chronic ulcer of left ankle with unspecified severity: Secondary | ICD-10-CM | POA: Diagnosis not present

## 2021-05-07 DIAGNOSIS — I739 Peripheral vascular disease, unspecified: Secondary | ICD-10-CM | POA: Diagnosis not present

## 2021-05-07 DIAGNOSIS — E7800 Pure hypercholesterolemia, unspecified: Secondary | ICD-10-CM | POA: Diagnosis not present

## 2021-05-07 DIAGNOSIS — L97519 Non-pressure chronic ulcer of other part of right foot with unspecified severity: Secondary | ICD-10-CM | POA: Diagnosis not present

## 2021-05-07 DIAGNOSIS — Z79899 Other long term (current) drug therapy: Secondary | ICD-10-CM | POA: Diagnosis not present

## 2021-05-07 DIAGNOSIS — G8220 Paraplegia, unspecified: Secondary | ICD-10-CM | POA: Diagnosis not present

## 2021-05-18 ENCOUNTER — Other Ambulatory Visit: Payer: Self-pay | Admitting: Nurse Practitioner

## 2021-05-20 DIAGNOSIS — I10 Essential (primary) hypertension: Secondary | ICD-10-CM | POA: Diagnosis not present

## 2021-05-20 DIAGNOSIS — L89529 Pressure ulcer of left ankle, unspecified stage: Secondary | ICD-10-CM | POA: Diagnosis not present

## 2021-05-20 DIAGNOSIS — R69 Illness, unspecified: Secondary | ICD-10-CM | POA: Diagnosis not present

## 2021-05-20 DIAGNOSIS — G8220 Paraplegia, unspecified: Secondary | ICD-10-CM | POA: Diagnosis not present

## 2021-05-20 DIAGNOSIS — L89219 Pressure ulcer of right hip, unspecified stage: Secondary | ICD-10-CM | POA: Diagnosis not present

## 2021-05-20 DIAGNOSIS — Z7902 Long term (current) use of antithrombotics/antiplatelets: Secondary | ICD-10-CM | POA: Diagnosis not present

## 2021-05-20 DIAGNOSIS — Z792 Long term (current) use of antibiotics: Secondary | ICD-10-CM | POA: Diagnosis not present

## 2021-05-20 DIAGNOSIS — E7800 Pure hypercholesterolemia, unspecified: Secondary | ICD-10-CM | POA: Diagnosis not present

## 2021-05-20 DIAGNOSIS — L97529 Non-pressure chronic ulcer of other part of left foot with unspecified severity: Secondary | ICD-10-CM | POA: Diagnosis not present

## 2021-05-20 DIAGNOSIS — L89214 Pressure ulcer of right hip, stage 4: Secondary | ICD-10-CM | POA: Diagnosis not present

## 2021-05-20 DIAGNOSIS — L97519 Non-pressure chronic ulcer of other part of right foot with unspecified severity: Secondary | ICD-10-CM | POA: Diagnosis not present

## 2021-05-20 DIAGNOSIS — Z79899 Other long term (current) drug therapy: Secondary | ICD-10-CM | POA: Diagnosis not present

## 2021-05-20 DIAGNOSIS — S91002A Unspecified open wound, left ankle, initial encounter: Secondary | ICD-10-CM | POA: Diagnosis not present

## 2021-05-20 DIAGNOSIS — S71001A Unspecified open wound, right hip, initial encounter: Secondary | ICD-10-CM | POA: Diagnosis not present

## 2021-05-24 DIAGNOSIS — L89523 Pressure ulcer of left ankle, stage 3: Secondary | ICD-10-CM | POA: Diagnosis not present

## 2021-05-24 DIAGNOSIS — E7800 Pure hypercholesterolemia, unspecified: Secondary | ICD-10-CM | POA: Diagnosis not present

## 2021-05-24 DIAGNOSIS — R69 Illness, unspecified: Secondary | ICD-10-CM | POA: Diagnosis not present

## 2021-05-24 DIAGNOSIS — Z945 Skin transplant status: Secondary | ICD-10-CM | POA: Diagnosis not present

## 2021-05-24 DIAGNOSIS — I739 Peripheral vascular disease, unspecified: Secondary | ICD-10-CM | POA: Diagnosis not present

## 2021-05-24 DIAGNOSIS — L97529 Non-pressure chronic ulcer of other part of left foot with unspecified severity: Secondary | ICD-10-CM | POA: Diagnosis not present

## 2021-05-24 DIAGNOSIS — L97519 Non-pressure chronic ulcer of other part of right foot with unspecified severity: Secondary | ICD-10-CM | POA: Diagnosis not present

## 2021-05-24 DIAGNOSIS — I10 Essential (primary) hypertension: Secondary | ICD-10-CM | POA: Diagnosis not present

## 2021-05-24 DIAGNOSIS — L89143 Pressure ulcer of left lower back, stage 3: Secondary | ICD-10-CM | POA: Diagnosis not present

## 2021-05-24 DIAGNOSIS — G8929 Other chronic pain: Secondary | ICD-10-CM | POA: Diagnosis not present

## 2021-05-24 DIAGNOSIS — L89214 Pressure ulcer of right hip, stage 4: Secondary | ICD-10-CM | POA: Diagnosis not present

## 2021-05-24 DIAGNOSIS — L89151 Pressure ulcer of sacral region, stage 1: Secondary | ICD-10-CM | POA: Diagnosis not present

## 2021-05-24 DIAGNOSIS — Z7902 Long term (current) use of antithrombotics/antiplatelets: Secondary | ICD-10-CM | POA: Diagnosis not present

## 2021-05-28 DIAGNOSIS — G8929 Other chronic pain: Secondary | ICD-10-CM | POA: Diagnosis not present

## 2021-05-28 DIAGNOSIS — L97519 Non-pressure chronic ulcer of other part of right foot with unspecified severity: Secondary | ICD-10-CM | POA: Diagnosis not present

## 2021-05-28 DIAGNOSIS — L97529 Non-pressure chronic ulcer of other part of left foot with unspecified severity: Secondary | ICD-10-CM | POA: Diagnosis not present

## 2021-05-28 DIAGNOSIS — L89214 Pressure ulcer of right hip, stage 4: Secondary | ICD-10-CM | POA: Diagnosis not present

## 2021-05-28 DIAGNOSIS — Z7902 Long term (current) use of antithrombotics/antiplatelets: Secondary | ICD-10-CM | POA: Diagnosis not present

## 2021-05-28 DIAGNOSIS — E7800 Pure hypercholesterolemia, unspecified: Secondary | ICD-10-CM | POA: Diagnosis not present

## 2021-05-28 DIAGNOSIS — I10 Essential (primary) hypertension: Secondary | ICD-10-CM | POA: Diagnosis not present

## 2021-05-28 DIAGNOSIS — L89143 Pressure ulcer of left lower back, stage 3: Secondary | ICD-10-CM | POA: Diagnosis not present

## 2021-05-28 DIAGNOSIS — R69 Illness, unspecified: Secondary | ICD-10-CM | POA: Diagnosis not present

## 2021-05-28 DIAGNOSIS — I739 Peripheral vascular disease, unspecified: Secondary | ICD-10-CM | POA: Diagnosis not present

## 2021-05-28 DIAGNOSIS — Z945 Skin transplant status: Secondary | ICD-10-CM | POA: Diagnosis not present

## 2021-05-28 DIAGNOSIS — L89151 Pressure ulcer of sacral region, stage 1: Secondary | ICD-10-CM | POA: Diagnosis not present

## 2021-05-28 DIAGNOSIS — L89523 Pressure ulcer of left ankle, stage 3: Secondary | ICD-10-CM | POA: Diagnosis not present

## 2021-06-04 DIAGNOSIS — E7800 Pure hypercholesterolemia, unspecified: Secondary | ICD-10-CM | POA: Diagnosis not present

## 2021-06-04 DIAGNOSIS — Z7902 Long term (current) use of antithrombotics/antiplatelets: Secondary | ICD-10-CM | POA: Diagnosis not present

## 2021-06-04 DIAGNOSIS — R69 Illness, unspecified: Secondary | ICD-10-CM | POA: Diagnosis not present

## 2021-06-04 DIAGNOSIS — G8929 Other chronic pain: Secondary | ICD-10-CM | POA: Diagnosis not present

## 2021-06-04 DIAGNOSIS — L89523 Pressure ulcer of left ankle, stage 3: Secondary | ICD-10-CM | POA: Diagnosis not present

## 2021-06-04 DIAGNOSIS — I739 Peripheral vascular disease, unspecified: Secondary | ICD-10-CM | POA: Diagnosis not present

## 2021-06-04 DIAGNOSIS — I10 Essential (primary) hypertension: Secondary | ICD-10-CM | POA: Diagnosis not present

## 2021-06-04 DIAGNOSIS — L89143 Pressure ulcer of left lower back, stage 3: Secondary | ICD-10-CM | POA: Diagnosis not present

## 2021-06-04 DIAGNOSIS — L89214 Pressure ulcer of right hip, stage 4: Secondary | ICD-10-CM | POA: Diagnosis not present

## 2021-06-04 DIAGNOSIS — L97519 Non-pressure chronic ulcer of other part of right foot with unspecified severity: Secondary | ICD-10-CM | POA: Diagnosis not present

## 2021-06-04 DIAGNOSIS — L89151 Pressure ulcer of sacral region, stage 1: Secondary | ICD-10-CM | POA: Diagnosis not present

## 2021-06-04 DIAGNOSIS — Z945 Skin transplant status: Secondary | ICD-10-CM | POA: Diagnosis not present

## 2021-06-04 DIAGNOSIS — L97529 Non-pressure chronic ulcer of other part of left foot with unspecified severity: Secondary | ICD-10-CM | POA: Diagnosis not present

## 2021-06-28 ENCOUNTER — Other Ambulatory Visit: Payer: Self-pay | Admitting: Nurse Practitioner

## 2021-06-28 DIAGNOSIS — L89159 Pressure ulcer of sacral region, unspecified stage: Secondary | ICD-10-CM | POA: Diagnosis not present

## 2021-06-28 DIAGNOSIS — L89523 Pressure ulcer of left ankle, stage 3: Secondary | ICD-10-CM | POA: Diagnosis not present

## 2021-06-28 DIAGNOSIS — I10 Essential (primary) hypertension: Secondary | ICD-10-CM

## 2021-06-28 DIAGNOSIS — I739 Peripheral vascular disease, unspecified: Secondary | ICD-10-CM | POA: Diagnosis not present

## 2021-06-28 DIAGNOSIS — L97529 Non-pressure chronic ulcer of other part of left foot with unspecified severity: Secondary | ICD-10-CM | POA: Diagnosis not present

## 2021-06-28 DIAGNOSIS — L97519 Non-pressure chronic ulcer of other part of right foot with unspecified severity: Secondary | ICD-10-CM | POA: Diagnosis not present

## 2021-06-28 DIAGNOSIS — Z792 Long term (current) use of antibiotics: Secondary | ICD-10-CM | POA: Diagnosis not present

## 2021-06-28 DIAGNOSIS — Z7902 Long term (current) use of antithrombotics/antiplatelets: Secondary | ICD-10-CM | POA: Diagnosis not present

## 2021-06-28 DIAGNOSIS — L89214 Pressure ulcer of right hip, stage 4: Secondary | ICD-10-CM | POA: Diagnosis not present

## 2021-06-28 DIAGNOSIS — R69 Illness, unspecified: Secondary | ICD-10-CM | POA: Diagnosis not present

## 2021-07-03 ENCOUNTER — Ambulatory Visit: Payer: Medicare HMO | Admitting: Physical Medicine and Rehabilitation

## 2021-07-08 ENCOUNTER — Encounter: Payer: Medicare HMO | Attending: Physical Medicine and Rehabilitation | Admitting: Physical Medicine and Rehabilitation

## 2021-07-08 ENCOUNTER — Encounter: Payer: Self-pay | Admitting: Physical Medicine and Rehabilitation

## 2021-07-08 VITALS — BP 152/72 | HR 78 | Ht 71.0 in

## 2021-07-08 DIAGNOSIS — L89213 Pressure ulcer of right hip, stage 3: Secondary | ICD-10-CM | POA: Insufficient documentation

## 2021-07-08 DIAGNOSIS — L89153 Pressure ulcer of sacral region, stage 3: Secondary | ICD-10-CM | POA: Diagnosis not present

## 2021-07-08 DIAGNOSIS — Z993 Dependence on wheelchair: Secondary | ICD-10-CM | POA: Insufficient documentation

## 2021-07-08 DIAGNOSIS — S24104S Unspecified injury at T11-T12 level of thoracic spinal cord, sequela: Secondary | ICD-10-CM | POA: Insufficient documentation

## 2021-07-08 DIAGNOSIS — R252 Cramp and spasm: Secondary | ICD-10-CM | POA: Insufficient documentation

## 2021-07-08 MED ORDER — TRAMADOL HCL 50 MG PO TABS: 50.0000 mg | ORAL_TABLET | Freq: Four times a day (QID) | ORAL | 5 refills | Status: DC | PRN
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
## 2021-07-11 DIAGNOSIS — N319 Neuromuscular dysfunction of bladder, unspecified: Secondary | ICD-10-CM | POA: Diagnosis not present

## 2021-07-11 DIAGNOSIS — N390 Urinary tract infection, site not specified: Secondary | ICD-10-CM | POA: Diagnosis not present

## 2021-07-19 DIAGNOSIS — L89512 Pressure ulcer of right ankle, stage 2: Secondary | ICD-10-CM | POA: Diagnosis not present

## 2021-07-19 DIAGNOSIS — K219 Gastro-esophageal reflux disease without esophagitis: Secondary | ICD-10-CM | POA: Diagnosis not present

## 2021-07-19 DIAGNOSIS — I739 Peripheral vascular disease, unspecified: Secondary | ICD-10-CM | POA: Diagnosis not present

## 2021-07-19 DIAGNOSIS — L89214 Pressure ulcer of right hip, stage 4: Secondary | ICD-10-CM | POA: Diagnosis not present

## 2021-07-19 DIAGNOSIS — G8929 Other chronic pain: Secondary | ICD-10-CM | POA: Diagnosis not present

## 2021-07-19 DIAGNOSIS — I10 Essential (primary) hypertension: Secondary | ICD-10-CM | POA: Diagnosis not present

## 2021-07-19 DIAGNOSIS — G8220 Paraplegia, unspecified: Secondary | ICD-10-CM | POA: Diagnosis not present

## 2021-07-19 DIAGNOSIS — M549 Dorsalgia, unspecified: Secondary | ICD-10-CM | POA: Diagnosis not present

## 2021-07-19 DIAGNOSIS — E7800 Pure hypercholesterolemia, unspecified: Secondary | ICD-10-CM | POA: Diagnosis not present

## 2021-07-19 DIAGNOSIS — Z79899 Other long term (current) drug therapy: Secondary | ICD-10-CM | POA: Diagnosis not present

## 2021-07-19 DIAGNOSIS — R69 Illness, unspecified: Secondary | ICD-10-CM | POA: Diagnosis not present

## 2021-07-19 DIAGNOSIS — L89523 Pressure ulcer of left ankle, stage 3: Secondary | ICD-10-CM | POA: Diagnosis not present

## 2021-07-19 DIAGNOSIS — Z7902 Long term (current) use of antithrombotics/antiplatelets: Secondary | ICD-10-CM | POA: Diagnosis not present

## 2021-07-31 ENCOUNTER — Telehealth: Payer: Self-pay | Admitting: Nurse Practitioner

## 2021-07-31 NOTE — Telephone Encounter (Signed)
Left message for patient to call back and schedule Medicare Annual Wellness Visit (AWV) to be completed by video or phone.   Last AWV: 07/24/2020  Please schedule at anytime with Wylie     45 minute appointment  Any questions, please contact me at [phone]

## 2021-08-03 ENCOUNTER — Other Ambulatory Visit: Payer: Self-pay | Admitting: Nurse Practitioner

## 2021-08-03 DIAGNOSIS — G8220 Paraplegia, unspecified: Secondary | ICD-10-CM

## 2021-08-06 ENCOUNTER — Ambulatory Visit (INDEPENDENT_AMBULATORY_CARE_PROVIDER_SITE_OTHER): Payer: Medicare HMO

## 2021-08-06 VITALS — Wt 155.0 lb

## 2021-08-06 DIAGNOSIS — Z0000 Encounter for general adult medical examination without abnormal findings: Secondary | ICD-10-CM | POA: Diagnosis not present

## 2021-08-06 NOTE — Patient Instructions (Signed)
Dustin Medina , Thank you for taking time to come for your Medicare Wellness Visit. I appreciate your ongoing commitment to your health goals. Please review the following plan we discussed and let me know if I can assist you in the future.   Screening recommendations/referrals: Colonoscopy: Done 04/16/2017 - Repeat in 5 years Recommended yearly ophthalmology/optometry visit for glaucoma screening and checkup Recommended yearly dental visit for hygiene and checkup  Vaccinations: Influenza vaccine: Declined - recommended every year in fall Pneumococcal vaccine: Due - consider Prevnar-20 at next visit (once per lifetime) Tdap vaccine: Done 04/23/2015 - Repeat in 10 years Shingles vaccine: Due - Shingrix is 2 doses 2-6 months apart and over 90% effective     Covid-19: Done Janssen 07/19/2019 & Moderna 02/14/2020  Advanced directives: Advance directive discussed with you today. Even though you declined this today, please call our office should you change your mind, and we can give you the proper paperwork for you to fill out.   Conditions/risks identified: Aim for 4-6 glasses of water daily, plenty of protein in your diet, and try to stretch every hour for 5-10 minutes at a time. Consider joining [employer] or trying Silver Sneakers to get more activity.  Next appointment: Follow up in one year for your annual wellness visit.   Preventive Care 34 Years and Older, Male  Preventive care refers to lifestyle choices and visits with your health care provider that can promote health and wellness. What does preventive care include? A yearly physical exam. This is also called an annual well check. Dental exams once or twice a year. Routine eye exams. Ask your health care provider how often you should have your eyes checked. Personal lifestyle choices, including: Daily care of your teeth and gums. Regular physical activity. Eating a healthy diet. Avoiding tobacco and drug use. Limiting alcohol use. Practicing  safe sex. Taking low doses of aspirin every day. Taking vitamin and mineral supplements as recommended by your health care provider. What happens during an annual well check? The services and screenings done by your health care provider during your annual well check will depend on your age, overall health, lifestyle risk factors, and family history of disease. Counseling  Your health care provider may ask you questions about your: Alcohol use. Tobacco use. Drug use. Emotional well-being. Home and relationship well-being. Sexual activity. Eating habits. History of falls. Memory and ability to understand (cognition). Work and work Statistician. Screening  You may have the following tests or measurements: Height, weight, and BMI. Blood pressure. Lipid and cholesterol levels. These may be checked every 5 years, or more frequently if you are over 23 years old. Skin check. Lung cancer screening. You may have this screening every year starting at age 42 if you have a 30-pack-year history of smoking and currently smoke or have quit within the past 15 years. Fecal occult blood test (FOBT) of the stool. You may have this test every year starting at age 8. Flexible sigmoidoscopy or colonoscopy. You may have a sigmoidoscopy every 5 years or a colonoscopy every 10 years starting at age 76. Prostate cancer screening. Recommendations will vary depending on your family history and other risks. Hepatitis C blood test. Hepatitis B blood test. Sexually transmitted disease (STD) testing. Diabetes screening. This is done by checking your blood sugar (glucose) after you have not eaten for a while (fasting). You may have this done every 1-3 years. Abdominal aortic aneurysm (AAA) screening. You may need this if you are a current or former  smoker. Osteoporosis. You may be screened starting at age 59 if you are at high risk. Talk with your health care provider about your test results, treatment options, and  if necessary, the need for more tests. Vaccines  Your health care provider may recommend certain vaccines, such as: Influenza vaccine. This is recommended every year. Tetanus, diphtheria, and acellular pertussis (Tdap, Td) vaccine. You may need a Td booster every 10 years. Zoster vaccine. You may need this after age 56. Pneumococcal 13-valent conjugate (PCV13) vaccine. One dose is recommended after age 18. Pneumococcal polysaccharide (PPSV23) vaccine. One dose is recommended after age 53. Talk to your health care provider about which screenings and vaccines you need and how often you need them. This information is not intended to replace advice given to you by your health care provider. Make sure you discuss any questions you have with your health care provider. Document Released: 03/02/2015 Document Revised: 10/24/2015 Document Reviewed: 12/05/2014 Elsevier Interactive Patient Education  2017 [place] Prevention in the Home Falls can cause injuries. They can happen to people of all ages. There are many things you can do to make your home safe and to help prevent falls. What can I do on the outside of my home? Regularly fix the edges of walkways and driveways and fix any cracks. Remove anything that might make you trip as you walk through a door, such as a raised step or threshold. Trim any bushes or trees on the path to your home. Use bright outdoor lighting. Clear any walking paths of anything that might make someone trip, such as rocks or tools. Regularly check to see if handrails are loose or broken. Make sure that both sides of any steps have handrails. Any raised decks and porches should have guardrails on the edges. Have any leaves, snow, or ice cleared regularly. Use sand or salt on walking paths during winter. Clean up any spills in your garage right away. This includes oil or grease spills. What can I do in the bathroom? Use night lights. Install grab bars by the  toilet and in the tub and shower. Do not use towel bars as grab bars. Use non-skid mats or decals in the tub or shower. If you need to sit down in the shower, use a plastic, non-slip stool. Keep the floor dry. Clean up any water that spills on the floor as soon as it happens. Remove soap buildup in the tub or shower regularly. Attach bath mats securely with double-sided non-slip rug tape. Do not have throw rugs and other things on the floor that can make you trip. What can I do in the bedroom? Use night lights. Make sure that you have a light by your bed that is easy to reach. Do not use any sheets or blankets that are too big for your bed. They should not hang down onto the floor. Have a firm chair that has side arms. You can use this for support while you get dressed. Do not have throw rugs and other things on the floor that can make you trip. What can I do in the kitchen? Clean up any spills right away. Avoid walking on wet floors. Keep items that you use a lot in easy-to-reach places. If you need to reach something above you, use a strong step stool that has a grab bar. Keep electrical cords out of the way. Do not use floor polish or wax that makes floors slippery. If you must use wax, use non-skid  floor wax. Do not have throw rugs and other things on the floor that can make you trip. What can I do with my stairs? Do not leave any items on the stairs. Make sure that there are handrails on both sides of the stairs and use them. Fix handrails that are broken or loose. Make sure that handrails are as long as the stairways. Check any carpeting to make sure that it is firmly attached to the stairs. Fix any carpet that is loose or worn. Avoid having throw rugs at the top or bottom of the stairs. If you do have throw rugs, attach them to the floor with carpet tape. Make sure that you have a light switch at the top of the stairs and the bottom of the stairs. If you do not have them, ask someone  to add them for you. What else can I do to help prevent falls? Wear shoes that: Do not have high heels. Have rubber bottoms. Are comfortable and fit you well. Are closed at the toe. Do not wear sandals. If you use a stepladder: Make sure that it is fully opened. Do not climb a closed stepladder. Make sure that both sides of the stepladder are locked into place. Ask someone to hold it for you, if possible. Clearly mark and make sure that you can see: Any grab bars or handrails. First and last steps. Where the edge of each step is. Use tools that help you move around (mobility aids) if they are needed. These include: Canes. Walkers. Scooters. Crutches. Turn on the lights when you go into a dark area. Replace any light bulbs as soon as they burn out. Set up your furniture so you have a clear path. Avoid moving your furniture around. If any of your floors are uneven, fix them. If there are any pets around you, be aware of where they are. Review your medicines with your doctor. Some medicines can make you feel dizzy. This can increase your chance of falling. Ask your doctor what other things that you can do to help prevent falls. This information is not intended to replace advice given to you by your health care provider. Make sure you discuss any questions you have with your health care provider. Document Released: 11/30/2008 Document Revised: 07/12/2015 Document Reviewed: 03/10/2014 Elsevier Interactive Patient Education  2017 [employer].
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
## 2021-08-09 DIAGNOSIS — R972 Elevated prostate specific antigen [PSA]: Secondary | ICD-10-CM | POA: Diagnosis not present

## 2021-08-09 DIAGNOSIS — N312 Flaccid neuropathic bladder, not elsewhere classified: Secondary | ICD-10-CM | POA: Diagnosis not present

## 2021-08-13 ENCOUNTER — Ambulatory Visit (INDEPENDENT_AMBULATORY_CARE_PROVIDER_SITE_OTHER): Payer: Medicare HMO | Admitting: Nurse Practitioner

## 2021-08-13 ENCOUNTER — Other Ambulatory Visit: Payer: Self-pay | Admitting: Nurse Practitioner

## 2021-08-13 ENCOUNTER — Encounter: Payer: Self-pay | Admitting: Nurse Practitioner

## 2021-08-13 VITALS — BP 145/80 | HR 98 | Temp 98.4°F | Resp 20

## 2021-08-13 DIAGNOSIS — I739 Peripheral vascular disease, unspecified: Secondary | ICD-10-CM | POA: Diagnosis not present

## 2021-08-13 DIAGNOSIS — N319 Neuromuscular dysfunction of bladder, unspecified: Secondary | ICD-10-CM

## 2021-08-13 DIAGNOSIS — Z993 Dependence on wheelchair: Secondary | ICD-10-CM

## 2021-08-13 DIAGNOSIS — I10 Essential (primary) hypertension: Secondary | ICD-10-CM | POA: Diagnosis not present

## 2021-08-13 DIAGNOSIS — M545 Low back pain, unspecified: Secondary | ICD-10-CM | POA: Diagnosis not present

## 2021-08-13 DIAGNOSIS — K592 Neurogenic bowel, not elsewhere classified: Secondary | ICD-10-CM

## 2021-08-13 DIAGNOSIS — S24104S Unspecified injury at T11-T12 level of thoracic spinal cord, sequela: Secondary | ICD-10-CM

## 2021-08-13 DIAGNOSIS — R252 Cramp and spasm: Secondary | ICD-10-CM

## 2021-08-13 DIAGNOSIS — G8220 Paraplegia, unspecified: Secondary | ICD-10-CM | POA: Diagnosis not present

## 2021-08-13 DIAGNOSIS — R7989 Other specified abnormal findings of blood chemistry: Secondary | ICD-10-CM | POA: Diagnosis not present

## 2021-08-13 DIAGNOSIS — G8929 Other chronic pain: Secondary | ICD-10-CM

## 2021-08-13 DIAGNOSIS — E785 Hyperlipidemia, unspecified: Secondary | ICD-10-CM

## 2021-08-13 DIAGNOSIS — Z87891 Personal history of nicotine dependence: Secondary | ICD-10-CM

## 2021-08-13 DIAGNOSIS — K219 Gastro-esophageal reflux disease without esophagitis: Secondary | ICD-10-CM

## 2021-08-13 DIAGNOSIS — K2101 Gastro-esophageal reflux disease with esophagitis, with bleeding: Secondary | ICD-10-CM

## 2021-08-13 MED ORDER — NIACIN ER (ANTIHYPERLIPIDEMIC) 1000 MG PO TBCR: 1000.0000 mg | EXTENDED_RELEASE_TABLET | Freq: Every day | ORAL | 1 refills | Status: DC

## 2021-08-13 MED ORDER — PANTOPRAZOLE SODIUM 40 MG PO TBEC: 40.0000 mg | DELAYED_RELEASE_TABLET | Freq: Every day | ORAL | 1 refills | Status: DC

## 2021-08-13 MED ORDER — AMLODIPINE BESYLATE 10 MG PO TABS: 10.0000 mg | ORAL_TABLET | Freq: Every day | ORAL | 1 refills | Status: DC

## 2021-08-13 MED ORDER — TESTOSTERONE CYPIONATE 200 MG/ML IM SOLN: 150.0000 mg | INTRAMUSCULAR | 0 refills | Status: DC

## 2021-08-13 MED ORDER — HYDROCHLOROTHIAZIDE 25 MG PO TABS: 25.0000 mg | ORAL_TABLET | Freq: Every day | ORAL | 1 refills | Status: DC

## 2021-08-13 MED ORDER — CLOPIDOGREL BISULFATE 75 MG PO TABS: 75.0000 mg | ORAL_TABLET | Freq: Every day | ORAL | 11 refills | Status: DC

## 2021-08-13 MED ORDER — LOSARTAN POTASSIUM 100 MG PO TABS: 100.0000 mg | ORAL_TABLET | Freq: Every day | ORAL | 1 refills | Status: DC

## 2021-08-13 MED ORDER — ATORVASTATIN CALCIUM 10 MG PO TABS: 10.0000 mg | ORAL_TABLET | Freq: Every day | ORAL | 1 refills | Status: DC

## 2021-08-13 NOTE — Progress Notes (Signed)
Subjective:    Patient ID: Dustin Medina, male    DOB: 02/17/1951, 71 y.o.   MRN: [id number]   Chief Complaint: medical management of chronic issues     HPI:  Dustin Medina is a 71 y.o. who identifies as a male who was assigned male at birth.   Social history: Lives with: by himself Work history: not able to work due to spinal cord injury   Comes in today for follow up of the following chronic medical issues:  1. Essential hypertension No c/o chest pain, sob or headache. Does check blood pressure at home. Runs 140 -150 systolic BP Readings from Last 3 Encounters:  08/13/21 (!) 145/80  07/08/21 (!) 152/72  03/04/21 (!) 152/91      2. Hyperlipidemia with target LDL less than 100 Does not watch diet. He says he exercises several times a week. Lab Results  Component Value Date   CHOL 158 02/07/2021   HDL 39 (L) 02/07/2021   LDLCALC 97 02/07/2021   TRIG 122 02/07/2021   CHOLHDL 4.1 02/07/2021     3. PVD (peripheral vascular disease) (HCC) Poor circulation of bil lower ext.  4. Gastroesophageal reflux disease, unspecified whether esophagitis present Is on protonix daily and works well to keep symptoms under control  5. Neurogenic bowel No issues  6. Paraplegic spinal paralysis (HCC) 7. T12 spinal cord injury, sequela (HCC) 8. Wheelchair dependence Gets  pressure ulcers frequently that have to be treated by wound clinic. He still has sore on buttocks and left ankle. Sees wound management pm Friday  this week.  9. Spasticity Uses flexeril when is bad other wise is tolerable.  10. Tobacco smoker within last 12 months Stopped smoking in February an dis doing well.  11. Low testosterone Is on testosterone replacements. Levels need to be repeat, but it is hard for him to get here before 10AM Lab Results  Component Value Date   TESTOSTERONE 286.7 (L) 06/20/2013     12. Chronic bilateral low back pain without sciatica Form sitting in wheelchair so much. He  is able to stand for short periods of time, but does not do this very often.  13. Neurogenic bladder No issues   New complaints: None today  No Known Allergies Outpatient Encounter Medications as of 08/13/2021  Medication Sig   amLODipine (NORVASC) 10 MG tablet Take 1 tablet (10 mg total) by mouth daily.   atorvastatin (LIPITOR) 10 MG tablet Take 1 tablet (10 mg total) by mouth daily at 6 PM.   clopidogrel (PLAVIX) 75 MG tablet Take 1 tablet (75 mg total) by mouth daily.   cyclobenzaprine (FLEXERIL) 10 MG tablet Take 10 mg by mouth daily as needed for muscle spasms. (Patient not taking: Reported on 08/06/2021)   hydrochlorothiazide (HYDRODIURIL) 25 MG tablet Take 1 tablet (25 mg total) by mouth daily.   ibuprofen (ADVIL) 200 MG tablet Take 200 mg by mouth every 6 (six) hours as needed for headache or moderate pain.   losartan (COZAAR) 100 MG tablet TAKE ONE TABLET (100 MG DOSE) BY MOUTH DAILY.   meloxicam (MOBIC) 15 MG tablet Take 1 tablet by mouth daily.   Multiple Vitamin (MULITIVITAMIN WITH MINERALS) TABS Take 1 tablet by mouth every other day.    Nerve Stimulator (PRO COMFORT TENS ELECTRODES) MISC Apply 2 patches topically daily as needed.   Nerve Stimulator (PRO COMFORT TENS UNIT) DEVI Apply 1 Units topically daily as needed.   niacin (NIASPAN) 1000 MG CR tablet TAKE 1  TABLET (1,000 MG TOTAL) BY MOUTH AT BEDTIME.   pantoprazole (PROTONIX) 40 MG tablet Take 1 tablet (40 mg total) by mouth daily.   promethazine (PHENERGAN) 25 MG tablet Take 1 tablet (25 mg total) by mouth every 8 (eight) hours as needed for nausea or vomiting.   silver sulfADIAZINE (SILVADENE) 1 % cream Apply 1 application topically daily as needed (wound care).    testosterone cypionate (DEPOTESTOSTERONE CYPIONATE) 200 MG/ML injection INJECT 0.75 MLS (150 MG TOTAL) INTO THE MUSCLE EVERY 14 (FOURTEEN) DAYS.   tiZANidine (ZANAFLEX) 4 MG tablet Take 0.5-1 tablets (2-4 mg total) by mouth 2 (two) times daily. For muscle  spasms   traMADol (ULTRAM) 50 MG tablet Take 1 tablet (50 mg total) by mouth every 6 (six) hours as needed.   trimethoprim (TRIMPEX) 100 MG tablet TAKE 1 TABLET BY MOUTH EVERY DAY   vitamin C (ASCORBIC ACID) 500 MG tablet Take 500 mg by mouth daily.   zinc gluconate 50 MG tablet Take 50 mg by mouth daily.   No facility-administered encounter medications on file as of 08/13/2021.    Past Surgical History:  Procedure Laterality Date   ABDOMINAL AORTOGRAM W/LOWER EXTREMITY Bilateral 11/19/2018   Procedure: ABDOMINAL AORTOGRAM W/LOWER EXTREMITY;  Surgeon: Chuck Hint, MD;  Location: [facility] INVASIVE CV LAB;  Service: Cardiovascular;  Laterality: Bilateral;   ABDOMINAL AORTOGRAM W/LOWER EXTREMITY Left 01/18/2021   Procedure: ABDOMINAL AORTOGRAM W/LOWER EXTREMITY;  Surgeon: Chuck Hint, MD;  Location: [facility] INVASIVE CV LAB;  Service: Cardiovascular;  Laterality: Left;   BACK SURGERY  1969   carpal tunnel Right 11/23/13   coloncoscopy  2008   Dr. Karilyn Cota: few small diverticula at ascending colon and external hemorrhoids, otherwise normal   COLONOSCOPY WITH PROPOFOL N/A 04/16/2017   Procedure: COLONOSCOPY WITH PROPOFOL;  Surgeon: Corbin Ade, MD;  Location: [facility];  Service: Endoscopy;  Laterality: N/A;  11:15am   dilation of esophagus     ESOPHAGOGASTRODUODENOSCOPY N/A 07/26/2012   [id number] Schatzki's ring. Hiatal hernia, likely upper GI bleed secondary to MW tear   HERNIA REPAIR  02/03/2001   paraplegia and right inguinal hernia   left leg surgery due to staph infection  2005   LIPOMA EXCISION  07/07/2011   Procedure: EXCISION LIPOMA;  Surgeon: Wilmon Arms. [name], MD;  Location: [facility];  Service: General;  Laterality: Right;   LOWER EXTREMITY ANGIOGRAPHY N/A 07/16/2018   Procedure: LOWER EXTREMITY ANGIOGRAPHY;  Surgeon: Chuck Hint, MD;  Location: [facility] INVASIVE CV LAB;  Service: Cardiovascular;  Laterality: N/A;   MULTIPLE TOOTH EXTRACTIONS     PERIPHERAL  VASCULAR INTERVENTION  07/16/2018   Procedure: PERIPHERAL VASCULAR INTERVENTION;  Surgeon: Chuck Hint, MD;  Location: [facility] INVASIVE CV LAB;  Service: Cardiovascular;;  right common iliac   PERIPHERAL VASCULAR INTERVENTION Left 01/18/2021   Procedure: PERIPHERAL VASCULAR INTERVENTION;  Surgeon: Chuck Hint, MD;  Location: [facility] INVASIVE CV LAB;  Service: Cardiovascular;  Laterality: Left;  SFA / common iliac   POLYPECTOMY  04/16/2017   Procedure: POLYPECTOMY;  Surgeon: Corbin Ade, MD;  Location: [facility];  Service: Endoscopy;;  colon   SURGERY FOR DECUBITUS ULCER      Family History  Problem Relation Age of Onset   COPD Father    Heart disease Father    Aneurysm Father    Hyperlipidemia Mother    Hypertension Mother    Diabetes Sister    Arrhythmia Daughter    Stroke Sister    Colon cancer  Maternal Grandfather    Esophageal cancer Neg Hx       Controlled substance contract: n/a     Review of Systems  Constitutional:  Negative for diaphoresis.  Eyes:  Negative for pain.  Respiratory:  Negative for shortness of breath.   Cardiovascular:  Negative for chest pain, palpitations and leg swelling.  Gastrointestinal:  Negative for abdominal pain.  Endocrine: Negative for polydipsia.  Skin:  Positive for wound (buttocks and left ankle). Negative for rash.  Neurological:  Negative for dizziness, weakness and headaches.  Hematological:  Does not bruise/bleed easily.  All other systems reviewed and are negative.      Objective:   Physical Exam Vitals and nursing note reviewed.  Constitutional:      Appearance: Normal appearance. He is well-developed.  HENT:     Head: Normocephalic.     Nose: Nose normal.     Mouth/Throat:     Mouth: Mucous membranes are moist.     Pharynx: Oropharynx is clear.  Eyes:     Pupils: Pupils are equal, round, and reactive to light.  Neck:     Thyroid: No thyroid mass or thyromegaly.     Vascular: No carotid bruit or  JVD.     Trachea: Phonation normal.  Cardiovascular:     Rate and Rhythm: Normal rate and regular rhythm.  Pulmonary:     Effort: Pulmonary effort is normal. No respiratory distress.     Breath sounds: Normal breath sounds.  Abdominal:     General: Bowel sounds are normal.     Palpations: Abdomen is soft.     Tenderness: There is no abdominal tenderness.  Musculoskeletal:        General: Normal range of motion.     Cervical back: Normal range of motion and neck supple.     Comments: In wheel chair  Lymphadenopathy:     Cervical: No cervical adenopathy.  Skin:    General: Skin is warm and dry.     Comments: Wounds not looked  because they are dressed and he has appointment on monday  Neurological:     Mental Status: He is alert and oriented to person, place, and time.  Psychiatric:        Behavior: Behavior normal.        Thought Content: Thought content normal.        Judgment: Judgment normal.    BP (!) 145/80   Pulse 98   Temp 98.4 F (36.9 C) (Oral)   Resp 20   SpO2 95%         Assessment & Plan:  TOMAR CLIPPARD comes in today with chief complaint of Medical Management of Chronic Issues   Diagnosis and orders addressed:  1. Essential hypertension Low sodium diet - losartan (COZAAR) 100 MG tablet; Take 1 tablet (100 mg total) by mouth daily.  Dispense: 90 tablet; Refill: 1 - hydrochlorothiazide (HYDRODIURIL) 25 MG tablet; Take 1 tablet (25 mg total) by mouth daily.  Dispense: 90 tablet; Refill: 1 - amLODipine (NORVASC) 10 MG tablet; Take 1 tablet (10 mg total) by mouth daily.  Dispense: 90 tablet; Refill: 1  2. Hyperlipidemia with target LDL less than 100 Low fat diet - niacin (NIASPAN) 1000 MG CR tablet; Take 1 tablet (1,000 mg total) by mouth at bedtime.  Dispense: 90 tablet; Refill: 1 - atorvastatin (LIPITOR) 10 MG tablet; Take 1 tablet (10 mg total) by mouth daily at 6 PM.  Dispense: 90 tablet; Refill: 1  3.  PVD (peripheral vascular disease) (HCC) -  clopidogrel (PLAVIX) 75 MG tablet; Take 1 tablet (75 mg total) by mouth daily.  Dispense: 30 tablet; Refill: 11  4. Gastroesophageal reflux disease, unspecified whether esophagitis present Avoid spicy foods Do not eat 2 hours prior to bedtime  5. Neurogenic bowel Report any issues  6. Paraplegic spinal paralysis (HCC) - testosterone cypionate (DEPOTESTOSTERONE CYPIONATE) 200 MG/ML injection; Inject 0.75 mLs (150 mg total) into the muscle every 14 (fourteen) days.  Dispense: 6 mL; Refill: 0  7. T12 spinal cord injury, sequela (HCC)  8. Wheelchair dependence Change positions as much as possible Daily standing  9. Spasticity   10. Former smoker Do not start smoking again  11. Low testosterone Continue testostrone  12. Chronic bilateral low back pain without sciatica  13. Neurogenic bladder Report any issues  14. Gastroesophageal reflux disease with esophagitis and hemorrhage Avoid spicy foods Do not eat 2 hours prior to bedtime  - pantoprazole (PROTONIX) 40 MG tablet; Take 1 tablet (40 mg total) by mouth daily.  Dispense: 90 tablet; Refill: 1   Labs pending Health Maintenance reviewed Diet and exercise encouraged  Follow up plan: 6 months   [name] Daphine Deutscher, FNP

## 2021-08-14 LAB — CBC WITH DIFFERENTIAL/PLATELET
Basophils Absolute: 0.1 10*3/uL (ref 0.0–0.2)
Basos: 1 %
EOS (ABSOLUTE): 0.2 10*3/uL (ref 0.0–0.4)
Eos: 2 %
Hematocrit: 43.8 % (ref 37.5–51.0)
Hemoglobin: 13.9 g/dL (ref 13.0–17.7)
Immature Grans (Abs): 0.1 10*3/uL (ref 0.0–0.1)
Immature Granulocytes: 1 %
Lymphocytes Absolute: 3.9 10*3/uL — ABNORMAL HIGH (ref 0.7–3.1)
Lymphs: 31 %
MCH: 26.6 pg (ref 26.6–33.0)
MCHC: 31.7 g/dL (ref 31.5–35.7)
MCV: 84 fL (ref 79–97)
Monocytes Absolute: 1.3 10*3/uL — ABNORMAL HIGH (ref 0.1–0.9)
Monocytes: 10 %
Neutrophils Absolute: 7.1 10*3/uL — ABNORMAL HIGH (ref 1.4–7.0)
Neutrophils: 55 %
Platelets: 518 10*3/uL — ABNORMAL HIGH (ref 150–450)
RBC: 5.23 x10E6/uL (ref 4.14–5.80)
RDW: 15.7 % — ABNORMAL HIGH (ref 11.6–15.4)
WBC: 12.7 10*3/uL — ABNORMAL HIGH (ref 3.4–10.8)

## 2021-08-14 LAB — CMP14+EGFR
ALT: 13 IU/L (ref 0–44)
AST: 13 IU/L (ref 0–40)
Albumin/Globulin Ratio: 1.3 (ref 1.2–2.2)
Albumin: 3.9 g/dL (ref 3.8–4.8)
Alkaline Phosphatase: 113 IU/L (ref 44–121)
BUN/Creatinine Ratio: 18 (ref 10–24)
BUN: 16 mg/dL (ref 8–27)
Bilirubin Total: 0.3 mg/dL (ref 0.0–1.2)
CO2: 25 mmol/L (ref 20–29)
Calcium: 9 mg/dL (ref 8.6–10.2)
Chloride: 103 mmol/L (ref 96–106)
Creatinine, Ser: 0.87 mg/dL (ref 0.76–1.27)
Globulin, Total: 2.9 g/dL (ref 1.5–4.5)
Glucose: 116 mg/dL — ABNORMAL HIGH (ref 70–99)
Potassium: 5 mmol/L (ref 3.5–5.2)
Sodium: 141 mmol/L (ref 134–144)
Total Protein: 6.8 g/dL (ref 6.0–8.5)
eGFR: 93 mL/min/1.73

## 2021-08-14 LAB — LIPID PANEL
Chol/HDL Ratio: 5.1 ratio — ABNORMAL HIGH (ref 0.0–5.0)
Cholesterol, Total: 180 mg/dL (ref 100–199)
HDL: 35 mg/dL — ABNORMAL LOW (ref >39)
LDL Chol Calc (NIH): 122 mg/dL — ABNORMAL HIGH (ref 0–99)
Triglycerides: 127 mg/dL (ref 0–149)
VLDL Cholesterol Cal: 23 mg/dL (ref 5–40)

## 2021-08-15 NOTE — Addendum Note (Signed)
Addended by: Chevis Pretty on: 08/15/2021 03:06 PM   Modules accepted: Orders

## 2021-08-16 DIAGNOSIS — L89214 Pressure ulcer of right hip, stage 4: Secondary | ICD-10-CM | POA: Diagnosis not present

## 2021-08-16 DIAGNOSIS — R69 Illness, unspecified: Secondary | ICD-10-CM | POA: Diagnosis not present

## 2021-08-16 DIAGNOSIS — L97529 Non-pressure chronic ulcer of other part of left foot with unspecified severity: Secondary | ICD-10-CM | POA: Diagnosis not present

## 2021-08-16 DIAGNOSIS — E7800 Pure hypercholesterolemia, unspecified: Secondary | ICD-10-CM | POA: Diagnosis not present

## 2021-08-16 DIAGNOSIS — L97519 Non-pressure chronic ulcer of other part of right foot with unspecified severity: Secondary | ICD-10-CM | POA: Diagnosis not present

## 2021-08-16 DIAGNOSIS — Z7902 Long term (current) use of antithrombotics/antiplatelets: Secondary | ICD-10-CM | POA: Diagnosis not present

## 2021-08-16 DIAGNOSIS — Z79899 Other long term (current) drug therapy: Secondary | ICD-10-CM | POA: Diagnosis not present

## 2021-08-16 DIAGNOSIS — I10 Essential (primary) hypertension: Secondary | ICD-10-CM | POA: Diagnosis not present

## 2021-08-30 DIAGNOSIS — N319 Neuromuscular dysfunction of bladder, unspecified: Secondary | ICD-10-CM | POA: Diagnosis not present

## 2021-08-30 DIAGNOSIS — N390 Urinary tract infection, site not specified: Secondary | ICD-10-CM | POA: Diagnosis not present

## 2021-08-30 DIAGNOSIS — R339 Retention of urine, unspecified: Secondary | ICD-10-CM | POA: Diagnosis not present
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
## 2021-09-02 ENCOUNTER — Encounter (HOSPITAL_COMMUNITY): Payer: Self-pay | Admitting: Hematology

## 2021-09-02 ENCOUNTER — Inpatient Hospital Stay (HOSPITAL_COMMUNITY): Payer: Medicare HMO | Attending: Hematology | Admitting: Hematology

## 2021-09-02 ENCOUNTER — Inpatient Hospital Stay (HOSPITAL_COMMUNITY): Payer: Medicare HMO

## 2021-09-02 VITALS — BP 141/74 | HR 79 | Temp 98.1°F | Resp 18 | Ht 71.0 in | Wt 155.0 lb

## 2021-09-02 DIAGNOSIS — Z800 Family history of malignant neoplasm of digestive organs: Secondary | ICD-10-CM | POA: Diagnosis not present

## 2021-09-02 DIAGNOSIS — I739 Peripheral vascular disease, unspecified: Secondary | ICD-10-CM | POA: Insufficient documentation

## 2021-09-02 DIAGNOSIS — D72829 Elevated white blood cell count, unspecified: Secondary | ICD-10-CM | POA: Insufficient documentation

## 2021-09-02 DIAGNOSIS — Z86718 Personal history of other venous thrombosis and embolism: Secondary | ICD-10-CM | POA: Diagnosis not present

## 2021-09-02 DIAGNOSIS — R7989 Other specified abnormal findings of blood chemistry: Secondary | ICD-10-CM | POA: Diagnosis not present

## 2021-09-02 DIAGNOSIS — L8990 Pressure ulcer of unspecified site, unspecified stage: Secondary | ICD-10-CM | POA: Insufficient documentation

## 2021-09-02 DIAGNOSIS — D75838 Other thrombocytosis: Secondary | ICD-10-CM | POA: Insufficient documentation

## 2021-09-02 DIAGNOSIS — N319 Neuromuscular dysfunction of bladder, unspecified: Secondary | ICD-10-CM | POA: Diagnosis not present

## 2021-09-02 DIAGNOSIS — G8220 Paraplegia, unspecified: Secondary | ICD-10-CM | POA: Diagnosis not present

## 2021-09-02 DIAGNOSIS — K219 Gastro-esophageal reflux disease without esophagitis: Secondary | ICD-10-CM | POA: Insufficient documentation

## 2021-09-02 DIAGNOSIS — Z993 Dependence on wheelchair: Secondary | ICD-10-CM | POA: Insufficient documentation

## 2021-09-02 DIAGNOSIS — Z8614 Personal history of Methicillin resistant Staphylococcus aureus infection: Secondary | ICD-10-CM | POA: Insufficient documentation

## 2021-09-02 DIAGNOSIS — Z79899 Other long term (current) drug therapy: Secondary | ICD-10-CM | POA: Insufficient documentation

## 2021-09-02 DIAGNOSIS — I10 Essential (primary) hypertension: Secondary | ICD-10-CM | POA: Insufficient documentation

## 2021-09-02 DIAGNOSIS — D75839 Thrombocytosis, unspecified: Secondary | ICD-10-CM | POA: Diagnosis not present

## 2021-09-02 DIAGNOSIS — E785 Hyperlipidemia, unspecified: Secondary | ICD-10-CM | POA: Diagnosis not present

## 2021-09-02 DIAGNOSIS — Z7902 Long term (current) use of antithrombotics/antiplatelets: Secondary | ICD-10-CM | POA: Diagnosis not present

## 2021-09-02 DIAGNOSIS — D72828 Other elevated white blood cell count: Secondary | ICD-10-CM

## 2021-09-02 DIAGNOSIS — Z87891 Personal history of nicotine dependence: Secondary | ICD-10-CM | POA: Diagnosis not present

## 2021-09-02 DIAGNOSIS — Z602 Problems related to living alone: Secondary | ICD-10-CM | POA: Diagnosis not present

## 2021-09-02 DIAGNOSIS — Z87440 Personal history of urinary (tract) infections: Secondary | ICD-10-CM | POA: Diagnosis not present

## 2021-09-02 LAB — LACTATE DEHYDROGENASE: LDH: 133 U/L (ref 98–192)

## 2021-09-02 LAB — CBC WITH DIFFERENTIAL/PLATELET
Abs Immature Granulocytes: 0.05 10*3/uL (ref 0.00–0.07)
Basophils Absolute: 0.1 10*3/uL (ref 0.0–0.1)
Basophils Relative: 1 %
Eosinophils Absolute: 0.1 10*3/uL (ref 0.0–0.5)
Eosinophils Relative: 1 %
HCT: 42.8 % (ref 39.0–52.0)
Hemoglobin: 13.9 g/dL (ref 13.0–17.0)
Immature Granulocytes: 1 %
Lymphocytes Relative: 25 %
Lymphs Abs: 2.4 10*3/uL (ref 0.7–4.0)
MCH: 27.5 pg (ref 26.0–34.0)
MCHC: 32.5 g/dL (ref 30.0–36.0)
MCV: 84.6 fL (ref 80.0–100.0)
Monocytes Absolute: 1.1 10*3/uL — ABNORMAL HIGH (ref 0.1–1.0)
Monocytes Relative: 11 %
Neutro Abs: 6 10*3/uL (ref 1.7–7.7)
Neutrophils Relative %: 61 %
Platelets: 360 10*3/uL (ref 150–400)
RBC: 5.06 MIL/uL (ref 4.22–5.81)
RDW: 16.4 % — ABNORMAL HIGH (ref 11.5–15.5)
WBC: 9.8 10*3/uL (ref 4.0–10.5)
nRBC: 0 % (ref 0.0–0.2)

## 2021-09-02 LAB — FERRITIN: Ferritin: 74 ng/mL (ref 24–336)

## 2021-09-02 LAB — IRON AND TIBC
Iron: 22 ug/dL — ABNORMAL LOW (ref 45–182)
Saturation Ratios: 7 % — ABNORMAL LOW (ref 17.9–39.5)
TIBC: 296 ug/dL (ref 250–450)
UIBC: 274 ug/dL

## 2021-09-02 LAB — SEDIMENTATION RATE: Sed Rate: 48 mm/hr — ABNORMAL HIGH (ref 0–16)

## 2021-09-02 LAB — C-REACTIVE PROTEIN: CRP: 8.1 mg/dL — ABNORMAL HIGH (ref <1.0)

## 2021-09-02 NOTE — Patient Instructions (Signed)
[place] at [facility] Discharge Instructions  You were seen today by Dr. Delton Coombes and Tarri Abernethy PA-C for your elevated platelets and elevated white blood cells.  As we discussed, your elevated blood counts are most likely reactive from your chronic decubitus ulcers.  However, we will check some labs today to rule out other causes.  We will see you for follow-up visit in about 3 weeks to discuss these results and next steps.    Thank you for choosing [place] at [facility] to provide your oncology and hematology care.  To afford each patient quality time with our provider, please arrive at least 15 minutes before your scheduled appointment time.   If you have a lab appointment with the [place] please come in thru the Main Entrance and check in at the main information desk.  You need to re-schedule your appointment should you arrive 10 or more minutes late.  We strive to give you quality time with our providers, and arriving late affects you and other patients whose appointments are after yours.  Also, if you no show three or more times for appointments you may be dismissed from the clinic at the providers discretion.     Again, thank you for choosing [facility].  Our hope is that these requests will decrease the amount of time that you wait before being seen by our physicians.       _____________________________________________________________  Should you have questions after your visit to [facility], please contact our office at [phone] and follow the prompts.  Our office hours are 8:00 a.m. and 4:30 p.m. Monday - Friday.  Please note that voicemails left after 4:00 p.m. may not be returned until the following business day.  We are closed weekends and major holidays.  You do have access to a nurse 24-7, just call the main number to the clinic [phone] and do not press any options, hold on the  line and a nurse will answer the phone.    For prescription refill requests, have your pharmacy contact our office and allow 72 hours.    Due to Covid, you will need to wear a mask upon entering the hospital. If you do not have a mask, a mask will be given to you at the Main Entrance upon arrival. For doctor visits, patients may have 1 support person age 69 or older with them. For treatment visits, patients can not have anyone with them due to social distancing guidelines and our immunocompromised population.

## 2021-09-03 LAB — RHEUMATOID FACTOR: Rheumatoid fact SerPl-aCnc: 11.6 IU/mL (ref <14.0)

## 2021-09-03 LAB — ANA: Anti Nuclear Antibody (ANA): NEGATIVE

## 2021-09-05 LAB — BCR-ABL1 FISH
Cells Analyzed: 200
Cells Counted: 200

## 2021-09-10 LAB — CALR +MPL + E12-E15  (REFLEX)

## 2021-09-10 LAB — JAK2 V617F RFX CALR/MPL/E12-15

## 2021-09-13 DIAGNOSIS — Z79899 Other long term (current) drug therapy: Secondary | ICD-10-CM | POA: Diagnosis not present

## 2021-09-13 DIAGNOSIS — L89510 Pressure ulcer of right ankle, unstageable: Secondary | ICD-10-CM | POA: Diagnosis not present

## 2021-09-13 DIAGNOSIS — L89523 Pressure ulcer of left ankle, stage 3: Secondary | ICD-10-CM | POA: Diagnosis not present

## 2021-09-13 DIAGNOSIS — Z7902 Long term (current) use of antithrombotics/antiplatelets: Secondary | ICD-10-CM | POA: Diagnosis not present

## 2021-09-13 DIAGNOSIS — L89214 Pressure ulcer of right hip, stage 4: Secondary | ICD-10-CM | POA: Diagnosis not present

## 2021-09-13 DIAGNOSIS — L89150 Pressure ulcer of sacral region, unstageable: Secondary | ICD-10-CM | POA: Diagnosis not present

## 2021-09-20 ENCOUNTER — Ambulatory Visit (INDEPENDENT_AMBULATORY_CARE_PROVIDER_SITE_OTHER): Payer: Medicare HMO | Admitting: Family Medicine

## 2021-09-20 ENCOUNTER — Encounter: Payer: Self-pay | Admitting: Family Medicine

## 2021-09-20 VITALS — BP 120/68 | HR 80 | Temp 98.3°F | Ht 71.0 in | Wt 155.0 lb

## 2021-09-20 DIAGNOSIS — G8220 Paraplegia, unspecified: Secondary | ICD-10-CM | POA: Diagnosis not present

## 2021-09-20 DIAGNOSIS — Z993 Dependence on wheelchair: Secondary | ICD-10-CM

## 2021-09-20 DIAGNOSIS — L89150 Pressure ulcer of sacral region, unstageable: Secondary | ICD-10-CM

## 2021-09-20 MED ORDER — SILVER SULFADIAZINE 1 % EX CREA: 1.0000 | TOPICAL_CREAM | Freq: Every day | CUTANEOUS | 0 refills | Status: AC | PRN

## 2021-09-20 NOTE — Progress Notes (Signed)
   Acute Office Visit  Subjective:     Patient ID: Dustin Medina, male    DOB: 1950-05-23, 71 y.o.   MRN: [id number]  Chief Complaint  Patient presents with   Ulcer    HPI Patient is in today for a pressure ulcer to him sacrum. This is an unstageable ulcer. He is seen by wound care and last had a visit with them last week. He has been changing his position frequently. He has been keeping the area dressed. Denies breakdown, exudate, fever, or chills. He would like a refill on silvadene for this area. He reports that wound care is aware of silvadene use.   ROS As per HPI.      Objective:    BP 120/68   Pulse 80   Temp 98.3 F (36.8 C) (Temporal)   Ht '5\' 11"'$  (1.803 m)   Wt 155 lb (70.3 kg) Comment: per pt-wheelchair bound  SpO2 95%   BMI 21.62 kg/m    Physical Exam Vitals and nursing note reviewed.  Constitutional:      General: He is not in acute distress.    Appearance: He is not ill-appearing, toxic-appearing or diaphoretic.  Cardiovascular:     Rate and Rhythm: Normal rate and regular rhythm.     Heart sounds: Normal heart sounds. No murmur heard. Pulmonary:     Effort: Pulmonary effort is normal. No respiratory distress.     Breath sounds: Normal breath sounds.  Musculoskeletal:     Right lower leg: No edema.     Left lower leg: No edema.  Skin:    General: Skin is warm and dry.  Neurological:     Mental Status: He is alert and oriented to person, place, and time.  Psychiatric:        Mood and Affect: Mood normal.        Behavior: Behavior normal.     No results found for any visits on 09/20/21.      Assessment & Plan:   Dustin Medina was seen today for ulcer.  Diagnoses and all orders for this visit:  Pressure injury of sacral region, unstageable ([place]) Unable to visualize today due to paraplegia/wheelchair dependent. Reporting no signs of infection or skin breakdown. Managed by wound care. Refill provided.  -     silver sulfADIAZINE (SILVADENE) 1 %  cream; Apply 1 Application topically daily as needed (wound care).  Paraplegic spinal paralysis [facility]) Wheelchair dependence  Return to office for new or worsening symptoms, or if symptoms persist.   The patient indicates understanding of these issues and agrees with the plan.   Gwenlyn Perking, FNP
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
## 2021-09-26 ENCOUNTER — Inpatient Hospital Stay: Payer: Medicare HMO | Attending: Physician Assistant | Admitting: Physician Assistant

## 2021-09-26 VITALS — BP 138/70 | HR 85 | Temp 98.7°F | Resp 18

## 2021-09-26 DIAGNOSIS — D72828 Other elevated white blood cell count: Secondary | ICD-10-CM | POA: Diagnosis not present

## 2021-09-26 DIAGNOSIS — Z86718 Personal history of other venous thrombosis and embolism: Secondary | ICD-10-CM | POA: Diagnosis not present

## 2021-09-26 DIAGNOSIS — Z86711 Personal history of pulmonary embolism: Secondary | ICD-10-CM | POA: Diagnosis not present

## 2021-09-26 DIAGNOSIS — D75839 Thrombocytosis, unspecified: Secondary | ICD-10-CM | POA: Diagnosis not present

## 2021-09-26 DIAGNOSIS — Z87891 Personal history of nicotine dependence: Secondary | ICD-10-CM | POA: Diagnosis not present

## 2021-09-26 DIAGNOSIS — D72829 Elevated white blood cell count, unspecified: Secondary | ICD-10-CM | POA: Diagnosis not present

## 2021-09-26 DIAGNOSIS — Z79899 Other long term (current) drug therapy: Secondary | ICD-10-CM | POA: Diagnosis not present

## 2021-09-26 NOTE — Patient Instructions (Signed)
[place] [facility] **   You were seen today by Tarri Abernethy PA-C for your elevated white blood cells and platelets.  Your most recent labs showed that your platelets and white blood cells have returned to normal.  Based on the labs that we checked, your white blood cell count does not show any signs of blood cancer.  We suspect that your white blood cells are elevated due to your underlying inflammation from your chronic health conditions, including your pressure ulcers.  You do not need any further testing or appointments at the hematology clinic [facility]).  However, you should continue to follow-up with your primary care doctor and should have your blood counts checked at least once per year.  Your primary care doctor can refer you back to see [place] in the future if needed if you have persistently elevated white blood cells > 20 (other than during active infections), or if you have any elevation in blood cells with unexplained fever, chills, night sweats, or weight loss.    ** Thank you for trusting me with your healthcare!  I strive to provide all of my patients with quality care at each visit.  If you receive a survey for this visit, I would be so grateful to you for taking the time to provide feedback.  Thank you in advance!  ~ Quavion Boule                   Dr. Derek Jack   &   Tarri Abernethy, PA-C   - - - - - - - - - - - - - - - - - -    Thank you for choosing [place] at [facility] to provide your oncology and hematology care.  To afford each patient quality time with our provider, please arrive at least 15 minutes before your scheduled appointment time.   If you have a lab appointment with the [facility] please come in thru the Main Entrance and check in at the main information desk.  You need to re-schedule your appointment should you arrive 10 or more minutes  late.  We strive to give you quality time with our providers, and arriving late affects you and other patients whose appointments are after yours.  Also, if you no show three or more times for appointments you may be dismissed from the clinic at the providers discretion.     Again, thank you for choosing [facility].  Our hope is that these requests will decrease the amount of time that you wait before being seen by our physicians.       _____________________________________________________________  Should you have questions after your visit to [facility], please contact our office at [phone] and follow the prompts.  Our office hours are 8:00 a.m. and 4:30 p.m. Monday - Friday.  Please note that voicemails left after 4:00 p.m. may not be returned until the following business day.  We are closed weekends and major holidays.  You do have access to a nurse 24-7, just call the main number to the clinic [phone] and do not press any options, hold on the line and a nurse will answer the phone.    For prescription refill requests, have your pharmacy contact our office and allow 72 hours.

## 2021-10-03 ENCOUNTER — Encounter: Payer: Self-pay | Admitting: Family Medicine

## 2021-10-03 ENCOUNTER — Ambulatory Visit (INDEPENDENT_AMBULATORY_CARE_PROVIDER_SITE_OTHER): Payer: Medicare HMO | Admitting: Family Medicine

## 2021-10-03 VITALS — BP 106/63 | HR 103 | Temp 98.4°F | Ht 71.0 in

## 2021-10-03 DIAGNOSIS — N3000 Acute cystitis without hematuria: Secondary | ICD-10-CM

## 2021-10-03 DIAGNOSIS — R8290 Unspecified abnormal findings in urine: Secondary | ICD-10-CM | POA: Diagnosis not present

## 2021-10-03 LAB — URINALYSIS, ROUTINE W REFLEX MICROSCOPIC
Bilirubin, UA: NEGATIVE
Glucose, UA: NEGATIVE
Ketones, UA: NEGATIVE
Nitrite, UA: POSITIVE — AB
Specific Gravity, UA: 1.02 (ref 1.005–1.030)
Urobilinogen, Ur: 1 mg/dL (ref 0.2–1.0)
pH, UA: 7.5 (ref 5.0–7.5)

## 2021-10-03 LAB — MICROSCOPIC EXAMINATION
Renal Epithel, UA: NONE SEEN /hpf
WBC, UA: >30 /hpf — AB (ref 0–5)

## 2021-10-03 MED ORDER — CEPHALEXIN 500 MG PO CAPS: 500.0000 mg | ORAL_CAPSULE | Freq: Two times a day (BID) | ORAL | 0 refills | Status: DC

## 2021-10-03 NOTE — Patient Instructions (Signed)
Urinary Tract Infection, Adult  A urinary tract infection (UTI) is an infection of any part of the urinary tract. The urinary tract includes the kidneys, ureters, bladder, and urethra. These organs make, store, and get rid of urine in the body. An upper UTI affects the ureters and kidneys. A lower UTI affects the bladder and urethra. What are the causes? Most urinary tract infections are caused by bacteria in your genital area around your urethra, where urine leaves your body. These bacteria grow and cause inflammation of your urinary tract. What increases the risk? You are more likely to develop this condition if: You have a urinary catheter that stays in place. You are not able to control when you urinate or have a bowel movement (incontinence). You are male and you: Use a spermicide or diaphragm for birth control. Have low estrogen levels. Are pregnant. You have certain genes that increase your risk. You are sexually active. You take antibiotic medicines. You have a condition that causes your flow of urine to slow down, such as: An enlarged prostate, if you are male. Blockage in your urethra. A kidney stone. A nerve condition that affects your bladder control (neurogenic bladder). Not getting enough to drink, or not urinating often. You have certain medical conditions, such as: Diabetes. A weak disease-fighting system (immunesystem). Sickle cell disease. Gout. Spinal cord injury. What are the signs or symptoms? Symptoms of this condition include: Needing to urinate right away (urgency). Frequent urination. This may include small amounts of urine each time you urinate. Pain or burning with urination. Blood in the urine. Urine that smells bad or unusual. Trouble urinating. Cloudy urine. Vaginal discharge, if you are male. Pain in the abdomen or the lower back. You may also have: Vomiting or a decreased appetite. Confusion. Irritability or tiredness. A fever or  chills. Diarrhea. The first symptom in older adults may be confusion. In some cases, they may not have any symptoms until the infection has worsened. How is this diagnosed? This condition is diagnosed based on your medical history and a physical exam. You may also have other tests, including: Urine tests. Blood tests. Tests for STIs (sexually transmitted infections). If you have had more than one UTI, a cystoscopy or imaging studies may be done to determine the cause of the infections. How is this treated? Treatment for this condition includes: Antibiotic medicine. Over-the-counter medicines to treat discomfort. Drinking enough water to stay hydrated. If you have frequent infections or have other conditions such as a kidney stone, you may need to see a health care provider who specializes in the urinary tract (urologist). In rare cases, urinary tract infections can cause sepsis. Sepsis is a life-threatening condition that occurs when the body responds to an infection. Sepsis is treated in the hospital with IV antibiotics, fluids, and other medicines. Follow these instructions at home:  Medicines Take over-the-counter and prescription medicines only as told by your health care provider. If you were prescribed an antibiotic medicine, take it as told by your health care provider. Do not stop using the antibiotic even if you start to feel better. General instructions Make sure you: Empty your bladder often and completely. Do not hold urine for long periods of time. Empty your bladder after sex. Wipe from front to back after urinating or having a bowel movement if you are male. Use each tissue only one time when you wipe. Drink enough fluid to keep your urine pale yellow. Keep all follow-up visits. This is important. Contact a health   care provider if: Your symptoms do not get better after 1-2 days. Your symptoms go away and then return. Get help right away if: You have severe pain in  your back or your lower abdomen. You have a fever or chills. You have nausea or vomiting. Summary A urinary tract infection (UTI) is an infection of any part of the urinary tract, which includes the kidneys, ureters, bladder, and urethra. Most urinary tract infections are caused by bacteria in your genital area. Treatment for this condition often includes antibiotic medicines. If you were prescribed an antibiotic medicine, take it as told by your health care provider. Do not stop using the antibiotic even if you start to feel better. Keep all follow-up visits. This is important. This information is not intended to replace advice given to you by your health care provider. Make sure you discuss any questions you have with your health care provider. Document Revised: 09/16/2019 Document Reviewed: 09/16/2019 Elsevier Patient Education  2023 Elsevier Inc.  

## 2021-10-03 NOTE — Progress Notes (Signed)
   Acute Office Visit  Subjective:     Patient ID: Dustin Medina, male    DOB: 1950-05-19, 71 y.o.   MRN: [id number]  Chief Complaint  Patient presents with   cloudy urine    Urinary Tract Infection  This is a new problem. Episode onset: 2-3 days. The patient is experiencing no pain. There has been no fever. Pertinent negatives include no chills, discharge, flank pain, hematuria, nausea or vomiting. Associated symptoms comments: Cloudy, odorous urine. He has tried increased fluids for the symptoms. The treatment provided no relief. His past medical history is significant for catheterization (chronically in and out caths due to paraplegia) and recurrent UTIs.     Review of Systems  Constitutional:  Negative for chills.  Gastrointestinal:  Negative for nausea and vomiting.  Genitourinary:  Negative for flank pain and hematuria.      Objective:    BP 106/63   Pulse (!) 103   Temp 98.4 F (36.9 C) (Temporal)   Ht '5\' 11"'$  (1.803 m)   SpO2 96%   BMI 21.62 kg/m    Physical Exam Vitals and nursing note reviewed.  Constitutional:      General: He is not in acute distress.    Appearance: He is not ill-appearing, toxic-appearing or diaphoretic.  Cardiovascular:     Rate and Rhythm: Normal rate and regular rhythm.     Heart sounds: Normal heart sounds. No murmur heard. Abdominal:     General: Bowel sounds are normal. There is no distension.     Palpations: Abdomen is soft.     Tenderness: There is no abdominal tenderness. There is no guarding or rebound.  Skin:    General: Skin is warm and dry.  Neurological:     Mental Status: He is alert.     Gait: Gait abnormal (arrives in wheelchair).  Psychiatric:        Mood and Affect: Mood normal.        Behavior: Behavior normal.     No results found for any visits on 10/03/21.      Assessment & Plan:   Dustin Medina was seen today for cloudy urine.  Diagnoses and all orders for this visit:  Acute cystitis without hematuria UA  positive for UTI. Keflex ordered. Urine culture pending.  -     Urinalysis, Routine w reflex microscopic -     Urine Culture -     cephALEXin (KEFLEX) 500 MG capsule; Take 1 capsule (500 mg total) by mouth 2 (two) times daily.   Return to office for new or worsening symptoms, or if symptoms persist.   The patient indicates understanding of these issues and agrees with the plan.  Gwenlyn Perking, FNP

## 2021-10-09 LAB — URINE CULTURE

## 2021-10-15 ENCOUNTER — Telehealth: Payer: Self-pay | Admitting: Family Medicine

## 2021-10-15 ENCOUNTER — Encounter: Payer: Self-pay | Admitting: Nurse Practitioner

## 2021-10-15 ENCOUNTER — Other Ambulatory Visit: Payer: Self-pay

## 2021-10-15 ENCOUNTER — Ambulatory Visit: Payer: Medicare HMO | Admitting: Nurse Practitioner

## 2021-10-15 DIAGNOSIS — Z87440 Personal history of urinary (tract) infections: Secondary | ICD-10-CM

## 2021-10-15 LAB — URINALYSIS, COMPLETE
Bilirubin, UA: NEGATIVE
Glucose, UA: NEGATIVE
Nitrite, UA: NEGATIVE
RBC, UA: NEGATIVE
Specific Gravity, UA: 1.02 (ref 1.005–1.030)
Urobilinogen, Ur: 1 mg/dL (ref 0.2–1.0)
pH, UA: 6 (ref 5.0–7.5)

## 2021-10-15 LAB — MICROSCOPIC EXAMINATION
RBC, Urine: NONE SEEN /hpf (ref 0–2)
Renal Epithel, UA: NONE SEEN /hpf

## 2021-10-15 NOTE — Telephone Encounter (Signed)
Advised patient to just drop off a urine and we would recheck. Future orders placed

## 2021-10-15 NOTE — Telephone Encounter (Signed)
Patient was scheduled for appointment this morning and was a no show

## 2021-10-15 NOTE — Telephone Encounter (Signed)
Pt had a visit with Dustin Medina on 10/03/21 and was prescribed an antibiotic. Pt says the medicine helped and he isnt having anymore symptoms but was urine rechecked to make sure infection is all gone.   Can [name] order this or does pt have to have another appt?

## 2021-10-16 DIAGNOSIS — K5900 Constipation, unspecified: Secondary | ICD-10-CM | POA: Diagnosis not present

## 2021-10-16 DIAGNOSIS — E785 Hyperlipidemia, unspecified: Secondary | ICD-10-CM | POA: Diagnosis not present

## 2021-10-16 DIAGNOSIS — N390 Urinary tract infection, site not specified: Secondary | ICD-10-CM | POA: Diagnosis not present

## 2021-10-16 DIAGNOSIS — K219 Gastro-esophageal reflux disease without esophagitis: Secondary | ICD-10-CM | POA: Diagnosis not present

## 2021-10-16 DIAGNOSIS — Z7902 Long term (current) use of antithrombotics/antiplatelets: Secondary | ICD-10-CM | POA: Diagnosis not present

## 2021-10-16 DIAGNOSIS — G8220 Paraplegia, unspecified: Secondary | ICD-10-CM | POA: Diagnosis not present

## 2021-10-16 DIAGNOSIS — Z87891 Personal history of nicotine dependence: Secondary | ICD-10-CM | POA: Diagnosis not present

## 2021-10-16 DIAGNOSIS — G629 Polyneuropathy, unspecified: Secondary | ICD-10-CM | POA: Diagnosis not present

## 2021-10-16 DIAGNOSIS — M7918 Myalgia, other site: Secondary | ICD-10-CM | POA: Diagnosis not present

## 2021-10-16 DIAGNOSIS — Z85828 Personal history of other malignant neoplasm of skin: Secondary | ICD-10-CM | POA: Diagnosis not present

## 2021-10-16 DIAGNOSIS — I10 Essential (primary) hypertension: Secondary | ICD-10-CM | POA: Diagnosis not present

## 2021-10-16 DIAGNOSIS — Z7982 Long term (current) use of aspirin: Secondary | ICD-10-CM | POA: Diagnosis not present

## 2021-10-25 DIAGNOSIS — R69 Illness, unspecified: Secondary | ICD-10-CM | POA: Diagnosis not present

## 2021-10-25 DIAGNOSIS — L89159 Pressure ulcer of sacral region, unspecified stage: Secondary | ICD-10-CM | POA: Diagnosis not present

## 2021-10-25 DIAGNOSIS — L89523 Pressure ulcer of left ankle, stage 3: Secondary | ICD-10-CM | POA: Diagnosis not present

## 2021-10-25 DIAGNOSIS — L97529 Non-pressure chronic ulcer of other part of left foot with unspecified severity: Secondary | ICD-10-CM | POA: Diagnosis not present

## 2021-10-25 DIAGNOSIS — I10 Essential (primary) hypertension: Secondary | ICD-10-CM | POA: Diagnosis not present

## 2021-10-25 DIAGNOSIS — L97519 Non-pressure chronic ulcer of other part of right foot with unspecified severity: Secondary | ICD-10-CM | POA: Diagnosis not present

## 2021-10-25 DIAGNOSIS — E7800 Pure hypercholesterolemia, unspecified: Secondary | ICD-10-CM | POA: Diagnosis not present

## 2021-10-25 DIAGNOSIS — Z792 Long term (current) use of antibiotics: Secondary | ICD-10-CM | POA: Diagnosis not present

## 2021-10-25 DIAGNOSIS — Z7902 Long term (current) use of antithrombotics/antiplatelets: Secondary | ICD-10-CM | POA: Diagnosis not present

## 2021-10-25 DIAGNOSIS — L89214 Pressure ulcer of right hip, stage 4: Secondary | ICD-10-CM | POA: Diagnosis not present

## 2021-10-28 DIAGNOSIS — G5601 Carpal tunnel syndrome, right upper limb: Secondary | ICD-10-CM | POA: Diagnosis not present

## 2021-10-28 DIAGNOSIS — E7800 Pure hypercholesterolemia, unspecified: Secondary | ICD-10-CM | POA: Diagnosis not present

## 2021-10-28 DIAGNOSIS — Z993 Dependence on wheelchair: Secondary | ICD-10-CM | POA: Diagnosis not present

## 2021-10-28 DIAGNOSIS — K219 Gastro-esophageal reflux disease without esophagitis: Secondary | ICD-10-CM | POA: Diagnosis not present

## 2021-10-28 DIAGNOSIS — I739 Peripheral vascular disease, unspecified: Secondary | ICD-10-CM | POA: Diagnosis not present

## 2021-10-28 DIAGNOSIS — M654 Radial styloid tenosynovitis [de Quervain]: Secondary | ICD-10-CM | POA: Diagnosis not present

## 2021-10-28 DIAGNOSIS — L89523 Pressure ulcer of left ankle, stage 3: Secondary | ICD-10-CM | POA: Diagnosis not present

## 2021-10-28 DIAGNOSIS — R339 Retention of urine, unspecified: Secondary | ICD-10-CM | POA: Diagnosis not present

## 2021-10-28 DIAGNOSIS — L89213 Pressure ulcer of right hip, stage 3: Secondary | ICD-10-CM | POA: Diagnosis not present

## 2021-10-28 DIAGNOSIS — G8929 Other chronic pain: Secondary | ICD-10-CM | POA: Diagnosis not present

## 2021-10-28 DIAGNOSIS — I10 Essential (primary) hypertension: Secondary | ICD-10-CM | POA: Diagnosis not present

## 2021-10-28 DIAGNOSIS — N319 Neuromuscular dysfunction of bladder, unspecified: Secondary | ICD-10-CM | POA: Diagnosis not present

## 2021-10-28 DIAGNOSIS — L89154 Pressure ulcer of sacral region, stage 4: Secondary | ICD-10-CM | POA: Diagnosis not present

## 2021-10-28 DIAGNOSIS — K222 Esophageal obstruction: Secondary | ICD-10-CM | POA: Diagnosis not present

## 2021-10-28 DIAGNOSIS — Z9181 History of falling: Secondary | ICD-10-CM | POA: Diagnosis not present

## 2021-10-28 DIAGNOSIS — K592 Neurogenic bowel, not elsewhere classified: Secondary | ICD-10-CM | POA: Diagnosis not present

## 2021-10-28 DIAGNOSIS — M549 Dorsalgia, unspecified: Secondary | ICD-10-CM | POA: Diagnosis not present

## 2021-10-28 DIAGNOSIS — Z7902 Long term (current) use of antithrombotics/antiplatelets: Secondary | ICD-10-CM | POA: Diagnosis not present

## 2021-11-01 DIAGNOSIS — G8929 Other chronic pain: Secondary | ICD-10-CM | POA: Diagnosis not present

## 2021-11-01 DIAGNOSIS — E7800 Pure hypercholesterolemia, unspecified: Secondary | ICD-10-CM | POA: Diagnosis not present

## 2021-11-01 DIAGNOSIS — I739 Peripheral vascular disease, unspecified: Secondary | ICD-10-CM | POA: Diagnosis not present

## 2021-11-01 DIAGNOSIS — Z993 Dependence on wheelchair: Secondary | ICD-10-CM | POA: Diagnosis not present

## 2021-11-01 DIAGNOSIS — Z7902 Long term (current) use of antithrombotics/antiplatelets: Secondary | ICD-10-CM | POA: Diagnosis not present

## 2021-11-01 DIAGNOSIS — K222 Esophageal obstruction: Secondary | ICD-10-CM | POA: Diagnosis not present

## 2021-11-01 DIAGNOSIS — L89213 Pressure ulcer of right hip, stage 3: Secondary | ICD-10-CM | POA: Diagnosis not present

## 2021-11-01 DIAGNOSIS — L89523 Pressure ulcer of left ankle, stage 3: Secondary | ICD-10-CM | POA: Diagnosis not present

## 2021-11-01 DIAGNOSIS — N319 Neuromuscular dysfunction of bladder, unspecified: Secondary | ICD-10-CM | POA: Diagnosis not present

## 2021-11-01 DIAGNOSIS — R339 Retention of urine, unspecified: Secondary | ICD-10-CM | POA: Diagnosis not present

## 2021-11-01 DIAGNOSIS — G5601 Carpal tunnel syndrome, right upper limb: Secondary | ICD-10-CM | POA: Diagnosis not present

## 2021-11-01 DIAGNOSIS — K219 Gastro-esophageal reflux disease without esophagitis: Secondary | ICD-10-CM | POA: Diagnosis not present

## 2021-11-01 DIAGNOSIS — K592 Neurogenic bowel, not elsewhere classified: Secondary | ICD-10-CM | POA: Diagnosis not present

## 2021-11-01 DIAGNOSIS — L89154 Pressure ulcer of sacral region, stage 4: Secondary | ICD-10-CM | POA: Diagnosis not present

## 2021-11-01 DIAGNOSIS — M549 Dorsalgia, unspecified: Secondary | ICD-10-CM | POA: Diagnosis not present

## 2021-11-01 DIAGNOSIS — I10 Essential (primary) hypertension: Secondary | ICD-10-CM | POA: Diagnosis not present

## 2021-11-01 DIAGNOSIS — Z9181 History of falling: Secondary | ICD-10-CM | POA: Diagnosis not present

## 2021-11-01 DIAGNOSIS — M654 Radial styloid tenosynovitis [de Quervain]: Secondary | ICD-10-CM | POA: Diagnosis not present

## 2021-11-04 DIAGNOSIS — K219 Gastro-esophageal reflux disease without esophagitis: Secondary | ICD-10-CM | POA: Diagnosis not present

## 2021-11-04 DIAGNOSIS — E7800 Pure hypercholesterolemia, unspecified: Secondary | ICD-10-CM | POA: Diagnosis not present

## 2021-11-04 DIAGNOSIS — K222 Esophageal obstruction: Secondary | ICD-10-CM | POA: Diagnosis not present

## 2021-11-04 DIAGNOSIS — M654 Radial styloid tenosynovitis [de Quervain]: Secondary | ICD-10-CM | POA: Diagnosis not present

## 2021-11-04 DIAGNOSIS — G8929 Other chronic pain: Secondary | ICD-10-CM | POA: Diagnosis not present

## 2021-11-04 DIAGNOSIS — N319 Neuromuscular dysfunction of bladder, unspecified: Secondary | ICD-10-CM | POA: Diagnosis not present

## 2021-11-04 DIAGNOSIS — G5601 Carpal tunnel syndrome, right upper limb: Secondary | ICD-10-CM | POA: Diagnosis not present

## 2021-11-04 DIAGNOSIS — R339 Retention of urine, unspecified: Secondary | ICD-10-CM | POA: Diagnosis not present

## 2021-11-04 DIAGNOSIS — I10 Essential (primary) hypertension: Secondary | ICD-10-CM | POA: Diagnosis not present

## 2021-11-04 DIAGNOSIS — L89154 Pressure ulcer of sacral region, stage 4: Secondary | ICD-10-CM | POA: Diagnosis not present

## 2021-11-04 DIAGNOSIS — Z9181 History of falling: Secondary | ICD-10-CM | POA: Diagnosis not present

## 2021-11-04 DIAGNOSIS — L89213 Pressure ulcer of right hip, stage 3: Secondary | ICD-10-CM | POA: Diagnosis not present

## 2021-11-04 DIAGNOSIS — I739 Peripheral vascular disease, unspecified: Secondary | ICD-10-CM | POA: Diagnosis not present

## 2021-11-04 DIAGNOSIS — Z993 Dependence on wheelchair: Secondary | ICD-10-CM | POA: Diagnosis not present

## 2021-11-04 DIAGNOSIS — L89523 Pressure ulcer of left ankle, stage 3: Secondary | ICD-10-CM | POA: Diagnosis not present

## 2021-11-04 DIAGNOSIS — K592 Neurogenic bowel, not elsewhere classified: Secondary | ICD-10-CM | POA: Diagnosis not present

## 2021-11-04 DIAGNOSIS — M549 Dorsalgia, unspecified: Secondary | ICD-10-CM | POA: Diagnosis not present

## 2021-11-04 DIAGNOSIS — Z7902 Long term (current) use of antithrombotics/antiplatelets: Secondary | ICD-10-CM | POA: Diagnosis not present

## 2021-11-06 ENCOUNTER — Other Ambulatory Visit: Payer: Self-pay | Admitting: Nurse Practitioner

## 2021-11-06 DIAGNOSIS — Z7902 Long term (current) use of antithrombotics/antiplatelets: Secondary | ICD-10-CM | POA: Diagnosis not present

## 2021-11-06 DIAGNOSIS — K219 Gastro-esophageal reflux disease without esophagitis: Secondary | ICD-10-CM | POA: Diagnosis not present

## 2021-11-06 DIAGNOSIS — K592 Neurogenic bowel, not elsewhere classified: Secondary | ICD-10-CM | POA: Diagnosis not present

## 2021-11-06 DIAGNOSIS — L89154 Pressure ulcer of sacral region, stage 4: Secondary | ICD-10-CM | POA: Diagnosis not present

## 2021-11-06 DIAGNOSIS — I10 Essential (primary) hypertension: Secondary | ICD-10-CM | POA: Diagnosis not present

## 2021-11-06 DIAGNOSIS — Z9181 History of falling: Secondary | ICD-10-CM | POA: Diagnosis not present

## 2021-11-06 DIAGNOSIS — L89523 Pressure ulcer of left ankle, stage 3: Secondary | ICD-10-CM | POA: Diagnosis not present

## 2021-11-06 DIAGNOSIS — L89213 Pressure ulcer of right hip, stage 3: Secondary | ICD-10-CM | POA: Diagnosis not present

## 2021-11-06 DIAGNOSIS — Z993 Dependence on wheelchair: Secondary | ICD-10-CM | POA: Diagnosis not present

## 2021-11-06 DIAGNOSIS — N319 Neuromuscular dysfunction of bladder, unspecified: Secondary | ICD-10-CM | POA: Diagnosis not present

## 2021-11-06 DIAGNOSIS — G5601 Carpal tunnel syndrome, right upper limb: Secondary | ICD-10-CM | POA: Diagnosis not present

## 2021-11-06 DIAGNOSIS — G8220 Paraplegia, unspecified: Secondary | ICD-10-CM

## 2021-11-06 DIAGNOSIS — G8929 Other chronic pain: Secondary | ICD-10-CM | POA: Diagnosis not present

## 2021-11-06 DIAGNOSIS — R339 Retention of urine, unspecified: Secondary | ICD-10-CM | POA: Diagnosis not present

## 2021-11-06 DIAGNOSIS — I739 Peripheral vascular disease, unspecified: Secondary | ICD-10-CM | POA: Diagnosis not present

## 2021-11-06 DIAGNOSIS — M654 Radial styloid tenosynovitis [de Quervain]: Secondary | ICD-10-CM | POA: Diagnosis not present

## 2021-11-06 DIAGNOSIS — M549 Dorsalgia, unspecified: Secondary | ICD-10-CM | POA: Diagnosis not present

## 2021-11-06 DIAGNOSIS — E7800 Pure hypercholesterolemia, unspecified: Secondary | ICD-10-CM | POA: Diagnosis not present

## 2021-11-06 DIAGNOSIS — K222 Esophageal obstruction: Secondary | ICD-10-CM | POA: Diagnosis not present

## 2021-11-08 ENCOUNTER — Other Ambulatory Visit: Payer: Self-pay | Admitting: Nurse Practitioner

## 2021-11-08 DIAGNOSIS — R69 Illness, unspecified: Secondary | ICD-10-CM | POA: Diagnosis not present

## 2021-11-08 DIAGNOSIS — Z792 Long term (current) use of antibiotics: Secondary | ICD-10-CM | POA: Diagnosis not present

## 2021-11-08 DIAGNOSIS — L97529 Non-pressure chronic ulcer of other part of left foot with unspecified severity: Secondary | ICD-10-CM | POA: Diagnosis not present

## 2021-11-08 DIAGNOSIS — L89523 Pressure ulcer of left ankle, stage 3: Secondary | ICD-10-CM | POA: Diagnosis not present

## 2021-11-08 DIAGNOSIS — L89214 Pressure ulcer of right hip, stage 4: Secondary | ICD-10-CM | POA: Diagnosis not present

## 2021-11-08 DIAGNOSIS — E7800 Pure hypercholesterolemia, unspecified: Secondary | ICD-10-CM | POA: Diagnosis not present

## 2021-11-08 DIAGNOSIS — Z7902 Long term (current) use of antithrombotics/antiplatelets: Secondary | ICD-10-CM | POA: Diagnosis not present

## 2021-11-08 DIAGNOSIS — L97519 Non-pressure chronic ulcer of other part of right foot with unspecified severity: Secondary | ICD-10-CM | POA: Diagnosis not present

## 2021-11-08 DIAGNOSIS — I10 Essential (primary) hypertension: Secondary | ICD-10-CM | POA: Diagnosis not present

## 2021-11-08 DIAGNOSIS — L89159 Pressure ulcer of sacral region, unspecified stage: Secondary | ICD-10-CM | POA: Diagnosis not present

## 2021-11-11 ENCOUNTER — Encounter: Payer: Medicare HMO | Attending: Physical Medicine and Rehabilitation | Admitting: Physical Medicine and Rehabilitation

## 2021-11-11 ENCOUNTER — Encounter: Payer: Self-pay | Admitting: Physical Medicine and Rehabilitation

## 2021-11-11 VITALS — BP 151/85 | HR 75 | Ht 71.0 in | Wt 155.0 lb

## 2021-11-11 DIAGNOSIS — G894 Chronic pain syndrome: Secondary | ICD-10-CM | POA: Diagnosis not present

## 2021-11-11 DIAGNOSIS — G5602 Carpal tunnel syndrome, left upper limb: Secondary | ICD-10-CM | POA: Diagnosis not present

## 2021-11-11 DIAGNOSIS — Z9181 History of falling: Secondary | ICD-10-CM | POA: Diagnosis not present

## 2021-11-11 DIAGNOSIS — Z5181 Encounter for therapeutic drug level monitoring: Secondary | ICD-10-CM | POA: Diagnosis not present

## 2021-11-11 DIAGNOSIS — G8220 Paraplegia, unspecified: Secondary | ICD-10-CM | POA: Diagnosis not present

## 2021-11-11 DIAGNOSIS — L89154 Pressure ulcer of sacral region, stage 4: Secondary | ICD-10-CM | POA: Diagnosis not present

## 2021-11-11 DIAGNOSIS — K592 Neurogenic bowel, not elsewhere classified: Secondary | ICD-10-CM | POA: Diagnosis not present

## 2021-11-11 DIAGNOSIS — I10 Essential (primary) hypertension: Secondary | ICD-10-CM | POA: Diagnosis not present

## 2021-11-11 DIAGNOSIS — Z993 Dependence on wheelchair: Secondary | ICD-10-CM | POA: Diagnosis not present

## 2021-11-11 DIAGNOSIS — K219 Gastro-esophageal reflux disease without esophagitis: Secondary | ICD-10-CM | POA: Diagnosis not present

## 2021-11-11 DIAGNOSIS — G5601 Carpal tunnel syndrome, right upper limb: Secondary | ICD-10-CM | POA: Diagnosis not present

## 2021-11-11 DIAGNOSIS — R339 Retention of urine, unspecified: Secondary | ICD-10-CM | POA: Diagnosis not present

## 2021-11-11 DIAGNOSIS — Z79891 Long term (current) use of opiate analgesic: Secondary | ICD-10-CM | POA: Diagnosis not present

## 2021-11-11 DIAGNOSIS — L89213 Pressure ulcer of right hip, stage 3: Secondary | ICD-10-CM | POA: Diagnosis not present

## 2021-11-11 DIAGNOSIS — L89523 Pressure ulcer of left ankle, stage 3: Secondary | ICD-10-CM | POA: Diagnosis not present

## 2021-11-11 DIAGNOSIS — N319 Neuromuscular dysfunction of bladder, unspecified: Secondary | ICD-10-CM | POA: Diagnosis not present

## 2021-11-11 DIAGNOSIS — M654 Radial styloid tenosynovitis [de Quervain]: Secondary | ICD-10-CM | POA: Diagnosis not present

## 2021-11-11 DIAGNOSIS — I739 Peripheral vascular disease, unspecified: Secondary | ICD-10-CM | POA: Diagnosis not present

## 2021-11-11 DIAGNOSIS — K222 Esophageal obstruction: Secondary | ICD-10-CM | POA: Diagnosis not present

## 2021-11-11 DIAGNOSIS — G8929 Other chronic pain: Secondary | ICD-10-CM | POA: Diagnosis not present

## 2021-11-11 DIAGNOSIS — Z7902 Long term (current) use of antithrombotics/antiplatelets: Secondary | ICD-10-CM | POA: Diagnosis not present

## 2021-11-11 DIAGNOSIS — M549 Dorsalgia, unspecified: Secondary | ICD-10-CM | POA: Diagnosis not present

## 2021-11-11 DIAGNOSIS — E7800 Pure hypercholesterolemia, unspecified: Secondary | ICD-10-CM | POA: Diagnosis not present

## 2021-11-11 MED ORDER — TRAMADOL HCL 50 MG PO TABS: 50.0000 mg | ORAL_TABLET | Freq: Four times a day (QID) | ORAL | 5 refills | Status: DC | PRN
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
## 2021-11-12 DIAGNOSIS — L89154 Pressure ulcer of sacral region, stage 4: Secondary | ICD-10-CM | POA: Diagnosis not present

## 2021-11-12 DIAGNOSIS — L89214 Pressure ulcer of right hip, stage 4: Secondary | ICD-10-CM | POA: Diagnosis not present

## 2021-11-12 DIAGNOSIS — L89510 Pressure ulcer of right ankle, unstageable: Secondary | ICD-10-CM | POA: Diagnosis not present

## 2021-11-12 DIAGNOSIS — L89523 Pressure ulcer of left ankle, stage 3: Secondary | ICD-10-CM | POA: Diagnosis not present

## 2021-11-15 DIAGNOSIS — Z7902 Long term (current) use of antithrombotics/antiplatelets: Secondary | ICD-10-CM | POA: Diagnosis not present

## 2021-11-15 DIAGNOSIS — I739 Peripheral vascular disease, unspecified: Secondary | ICD-10-CM | POA: Diagnosis not present

## 2021-11-15 DIAGNOSIS — K592 Neurogenic bowel, not elsewhere classified: Secondary | ICD-10-CM | POA: Diagnosis not present

## 2021-11-15 DIAGNOSIS — Z9181 History of falling: Secondary | ICD-10-CM | POA: Diagnosis not present

## 2021-11-15 DIAGNOSIS — G5601 Carpal tunnel syndrome, right upper limb: Secondary | ICD-10-CM | POA: Diagnosis not present

## 2021-11-15 DIAGNOSIS — M654 Radial styloid tenosynovitis [de Quervain]: Secondary | ICD-10-CM | POA: Diagnosis not present

## 2021-11-15 DIAGNOSIS — R339 Retention of urine, unspecified: Secondary | ICD-10-CM | POA: Diagnosis not present

## 2021-11-15 DIAGNOSIS — L89154 Pressure ulcer of sacral region, stage 4: Secondary | ICD-10-CM | POA: Diagnosis not present

## 2021-11-15 DIAGNOSIS — N319 Neuromuscular dysfunction of bladder, unspecified: Secondary | ICD-10-CM | POA: Diagnosis not present

## 2021-11-15 DIAGNOSIS — K219 Gastro-esophageal reflux disease without esophagitis: Secondary | ICD-10-CM | POA: Diagnosis not present

## 2021-11-15 DIAGNOSIS — K222 Esophageal obstruction: Secondary | ICD-10-CM | POA: Diagnosis not present

## 2021-11-15 DIAGNOSIS — E7800 Pure hypercholesterolemia, unspecified: Secondary | ICD-10-CM | POA: Diagnosis not present

## 2021-11-15 DIAGNOSIS — Z993 Dependence on wheelchair: Secondary | ICD-10-CM | POA: Diagnosis not present

## 2021-11-15 DIAGNOSIS — M549 Dorsalgia, unspecified: Secondary | ICD-10-CM | POA: Diagnosis not present

## 2021-11-15 DIAGNOSIS — I10 Essential (primary) hypertension: Secondary | ICD-10-CM | POA: Diagnosis not present

## 2021-11-15 DIAGNOSIS — G8929 Other chronic pain: Secondary | ICD-10-CM | POA: Diagnosis not present

## 2021-11-15 DIAGNOSIS — L89213 Pressure ulcer of right hip, stage 3: Secondary | ICD-10-CM | POA: Diagnosis not present

## 2021-11-15 DIAGNOSIS — L89523 Pressure ulcer of left ankle, stage 3: Secondary | ICD-10-CM | POA: Diagnosis not present

## 2021-11-15 LAB — DRUG TOX MONITOR 1 W/CONF, ORAL FLD
Amphetamines: NEGATIVE ng/mL (ref <10)
Barbiturates: NEGATIVE ng/mL (ref <10)
Benzodiazepines: NEGATIVE ng/mL (ref <0.50)
Buprenorphine: NEGATIVE ng/mL (ref <0.10)
Cocaine: NEGATIVE ng/mL (ref <5.0)
Cotinine: 52.2 ng/mL — ABNORMAL HIGH (ref <5.0)
Fentanyl: NEGATIVE ng/mL (ref <0.10)
Heroin Metabolite: NEGATIVE ng/mL (ref <1.0)
MARIJUANA: NEGATIVE ng/mL (ref <2.5)
MDMA: NEGATIVE ng/mL (ref <10)
Meprobamate: NEGATIVE ng/mL (ref <2.5)
Methadone: NEGATIVE ng/mL (ref <5.0)
Nicotine Metabolite: POSITIVE ng/mL — AB (ref <5.0)
Opiates: NEGATIVE ng/mL (ref <2.5)
Phencyclidine: NEGATIVE ng/mL (ref <10)
Tapentadol: NEGATIVE ng/mL (ref <5.0)
Tramadol: NEGATIVE ng/mL (ref <5.0)
Zolpidem: NEGATIVE ng/mL (ref <5.0)

## 2021-11-15 LAB — DRUG TOX ALC METAB W/CON, ORAL FLD: Alcohol Metabolite: NEGATIVE ng/mL (ref <25)

## 2021-11-18 DIAGNOSIS — Z9181 History of falling: Secondary | ICD-10-CM | POA: Diagnosis not present

## 2021-11-18 DIAGNOSIS — G5601 Carpal tunnel syndrome, right upper limb: Secondary | ICD-10-CM | POA: Diagnosis not present

## 2021-11-18 DIAGNOSIS — Z993 Dependence on wheelchair: Secondary | ICD-10-CM | POA: Diagnosis not present

## 2021-11-18 DIAGNOSIS — R339 Retention of urine, unspecified: Secondary | ICD-10-CM | POA: Diagnosis not present

## 2021-11-18 DIAGNOSIS — L89154 Pressure ulcer of sacral region, stage 4: Secondary | ICD-10-CM | POA: Diagnosis not present

## 2021-11-18 DIAGNOSIS — K219 Gastro-esophageal reflux disease without esophagitis: Secondary | ICD-10-CM | POA: Diagnosis not present

## 2021-11-18 DIAGNOSIS — I10 Essential (primary) hypertension: Secondary | ICD-10-CM | POA: Diagnosis not present

## 2021-11-18 DIAGNOSIS — M549 Dorsalgia, unspecified: Secondary | ICD-10-CM | POA: Diagnosis not present

## 2021-11-18 DIAGNOSIS — G8929 Other chronic pain: Secondary | ICD-10-CM | POA: Diagnosis not present

## 2021-11-18 DIAGNOSIS — K222 Esophageal obstruction: Secondary | ICD-10-CM | POA: Diagnosis not present

## 2021-11-18 DIAGNOSIS — K592 Neurogenic bowel, not elsewhere classified: Secondary | ICD-10-CM | POA: Diagnosis not present

## 2021-11-18 DIAGNOSIS — M654 Radial styloid tenosynovitis [de Quervain]: Secondary | ICD-10-CM | POA: Diagnosis not present

## 2021-11-18 DIAGNOSIS — N319 Neuromuscular dysfunction of bladder, unspecified: Secondary | ICD-10-CM | POA: Diagnosis not present

## 2021-11-18 DIAGNOSIS — L89213 Pressure ulcer of right hip, stage 3: Secondary | ICD-10-CM | POA: Diagnosis not present

## 2021-11-18 DIAGNOSIS — Z7902 Long term (current) use of antithrombotics/antiplatelets: Secondary | ICD-10-CM | POA: Diagnosis not present

## 2021-11-18 DIAGNOSIS — E7800 Pure hypercholesterolemia, unspecified: Secondary | ICD-10-CM | POA: Diagnosis not present

## 2021-11-18 DIAGNOSIS — L89523 Pressure ulcer of left ankle, stage 3: Secondary | ICD-10-CM | POA: Diagnosis not present

## 2021-11-18 DIAGNOSIS — I739 Peripheral vascular disease, unspecified: Secondary | ICD-10-CM | POA: Diagnosis not present

## 2021-11-19 DIAGNOSIS — N319 Neuromuscular dysfunction of bladder, unspecified: Secondary | ICD-10-CM | POA: Diagnosis not present

## 2021-11-19 DIAGNOSIS — N390 Urinary tract infection, site not specified: Secondary | ICD-10-CM | POA: Diagnosis not present

## 2021-11-20 ENCOUNTER — Telehealth: Payer: Self-pay | Admitting: *Deleted

## 2021-11-20 NOTE — Telephone Encounter (Signed)
Oral swab drug screen was consistent. Tramadol last taken a month ago per pt.

## 2021-11-21 DIAGNOSIS — R339 Retention of urine, unspecified: Secondary | ICD-10-CM | POA: Diagnosis not present

## 2021-11-21 DIAGNOSIS — I10 Essential (primary) hypertension: Secondary | ICD-10-CM | POA: Diagnosis not present

## 2021-11-21 DIAGNOSIS — N319 Neuromuscular dysfunction of bladder, unspecified: Secondary | ICD-10-CM | POA: Diagnosis not present

## 2021-11-21 DIAGNOSIS — K592 Neurogenic bowel, not elsewhere classified: Secondary | ICD-10-CM | POA: Diagnosis not present

## 2021-11-21 DIAGNOSIS — Z9181 History of falling: Secondary | ICD-10-CM | POA: Diagnosis not present

## 2021-11-21 DIAGNOSIS — Z7902 Long term (current) use of antithrombotics/antiplatelets: Secondary | ICD-10-CM | POA: Diagnosis not present

## 2021-11-21 DIAGNOSIS — G5601 Carpal tunnel syndrome, right upper limb: Secondary | ICD-10-CM | POA: Diagnosis not present

## 2021-11-21 DIAGNOSIS — L89213 Pressure ulcer of right hip, stage 3: Secondary | ICD-10-CM | POA: Diagnosis not present

## 2021-11-21 DIAGNOSIS — L89523 Pressure ulcer of left ankle, stage 3: Secondary | ICD-10-CM | POA: Diagnosis not present

## 2021-11-21 DIAGNOSIS — L89154 Pressure ulcer of sacral region, stage 4: Secondary | ICD-10-CM | POA: Diagnosis not present

## 2021-11-21 DIAGNOSIS — M654 Radial styloid tenosynovitis [de Quervain]: Secondary | ICD-10-CM | POA: Diagnosis not present

## 2021-11-21 DIAGNOSIS — E7800 Pure hypercholesterolemia, unspecified: Secondary | ICD-10-CM | POA: Diagnosis not present

## 2021-11-21 DIAGNOSIS — K222 Esophageal obstruction: Secondary | ICD-10-CM | POA: Diagnosis not present

## 2021-11-21 DIAGNOSIS — M549 Dorsalgia, unspecified: Secondary | ICD-10-CM | POA: Diagnosis not present

## 2021-11-21 DIAGNOSIS — I739 Peripheral vascular disease, unspecified: Secondary | ICD-10-CM | POA: Diagnosis not present

## 2021-11-21 DIAGNOSIS — K219 Gastro-esophageal reflux disease without esophagitis: Secondary | ICD-10-CM | POA: Diagnosis not present

## 2021-11-21 DIAGNOSIS — G8929 Other chronic pain: Secondary | ICD-10-CM | POA: Diagnosis not present

## 2021-11-21 DIAGNOSIS — Z993 Dependence on wheelchair: Secondary | ICD-10-CM | POA: Diagnosis not present

## 2021-11-25 DIAGNOSIS — I10 Essential (primary) hypertension: Secondary | ICD-10-CM | POA: Diagnosis not present

## 2021-11-25 DIAGNOSIS — G8929 Other chronic pain: Secondary | ICD-10-CM | POA: Diagnosis not present

## 2021-11-25 DIAGNOSIS — L89523 Pressure ulcer of left ankle, stage 3: Secondary | ICD-10-CM | POA: Diagnosis not present

## 2021-11-25 DIAGNOSIS — L89213 Pressure ulcer of right hip, stage 3: Secondary | ICD-10-CM | POA: Diagnosis not present

## 2021-11-25 DIAGNOSIS — Z993 Dependence on wheelchair: Secondary | ICD-10-CM | POA: Diagnosis not present

## 2021-11-25 DIAGNOSIS — R339 Retention of urine, unspecified: Secondary | ICD-10-CM | POA: Diagnosis not present

## 2021-11-25 DIAGNOSIS — M549 Dorsalgia, unspecified: Secondary | ICD-10-CM | POA: Diagnosis not present

## 2021-11-25 DIAGNOSIS — K219 Gastro-esophageal reflux disease without esophagitis: Secondary | ICD-10-CM | POA: Diagnosis not present

## 2021-11-25 DIAGNOSIS — L89154 Pressure ulcer of sacral region, stage 4: Secondary | ICD-10-CM | POA: Diagnosis not present

## 2021-11-25 DIAGNOSIS — Z9181 History of falling: Secondary | ICD-10-CM | POA: Diagnosis not present

## 2021-11-25 DIAGNOSIS — G5601 Carpal tunnel syndrome, right upper limb: Secondary | ICD-10-CM | POA: Diagnosis not present

## 2021-11-25 DIAGNOSIS — E7800 Pure hypercholesterolemia, unspecified: Secondary | ICD-10-CM | POA: Diagnosis not present

## 2021-11-25 DIAGNOSIS — K222 Esophageal obstruction: Secondary | ICD-10-CM | POA: Diagnosis not present

## 2021-11-25 DIAGNOSIS — K592 Neurogenic bowel, not elsewhere classified: Secondary | ICD-10-CM | POA: Diagnosis not present

## 2021-11-25 DIAGNOSIS — M654 Radial styloid tenosynovitis [de Quervain]: Secondary | ICD-10-CM | POA: Diagnosis not present

## 2021-11-25 DIAGNOSIS — N319 Neuromuscular dysfunction of bladder, unspecified: Secondary | ICD-10-CM | POA: Diagnosis not present

## 2021-11-25 DIAGNOSIS — I739 Peripheral vascular disease, unspecified: Secondary | ICD-10-CM | POA: Diagnosis not present

## 2021-11-25 DIAGNOSIS — Z7902 Long term (current) use of antithrombotics/antiplatelets: Secondary | ICD-10-CM | POA: Diagnosis not present

## 2021-11-26 DIAGNOSIS — L89510 Pressure ulcer of right ankle, unstageable: Secondary | ICD-10-CM | POA: Diagnosis not present

## 2021-11-26 DIAGNOSIS — L89523 Pressure ulcer of left ankle, stage 3: Secondary | ICD-10-CM | POA: Diagnosis not present

## 2021-11-26 DIAGNOSIS — L89214 Pressure ulcer of right hip, stage 4: Secondary | ICD-10-CM | POA: Diagnosis not present

## 2021-11-26 DIAGNOSIS — L89154 Pressure ulcer of sacral region, stage 4: Secondary | ICD-10-CM | POA: Diagnosis not present

## 2021-11-27 DIAGNOSIS — L89154 Pressure ulcer of sacral region, stage 4: Secondary | ICD-10-CM | POA: Diagnosis not present

## 2021-11-29 DIAGNOSIS — G8929 Other chronic pain: Secondary | ICD-10-CM | POA: Diagnosis not present

## 2021-11-29 DIAGNOSIS — L89154 Pressure ulcer of sacral region, stage 4: Secondary | ICD-10-CM | POA: Diagnosis not present

## 2021-11-29 DIAGNOSIS — Z9181 History of falling: Secondary | ICD-10-CM | POA: Diagnosis not present

## 2021-11-29 DIAGNOSIS — N319 Neuromuscular dysfunction of bladder, unspecified: Secondary | ICD-10-CM | POA: Diagnosis not present

## 2021-11-29 DIAGNOSIS — M549 Dorsalgia, unspecified: Secondary | ICD-10-CM | POA: Diagnosis not present

## 2021-11-29 DIAGNOSIS — K592 Neurogenic bowel, not elsewhere classified: Secondary | ICD-10-CM | POA: Diagnosis not present

## 2021-11-29 DIAGNOSIS — R339 Retention of urine, unspecified: Secondary | ICD-10-CM | POA: Diagnosis not present

## 2021-11-29 DIAGNOSIS — G5601 Carpal tunnel syndrome, right upper limb: Secondary | ICD-10-CM | POA: Diagnosis not present

## 2021-11-29 DIAGNOSIS — I10 Essential (primary) hypertension: Secondary | ICD-10-CM | POA: Diagnosis not present

## 2021-11-29 DIAGNOSIS — I739 Peripheral vascular disease, unspecified: Secondary | ICD-10-CM | POA: Diagnosis not present

## 2021-11-29 DIAGNOSIS — M654 Radial styloid tenosynovitis [de Quervain]: Secondary | ICD-10-CM | POA: Diagnosis not present

## 2021-11-29 DIAGNOSIS — E7800 Pure hypercholesterolemia, unspecified: Secondary | ICD-10-CM | POA: Diagnosis not present

## 2021-11-29 DIAGNOSIS — Z7902 Long term (current) use of antithrombotics/antiplatelets: Secondary | ICD-10-CM | POA: Diagnosis not present

## 2021-11-29 DIAGNOSIS — K219 Gastro-esophageal reflux disease without esophagitis: Secondary | ICD-10-CM | POA: Diagnosis not present

## 2021-11-29 DIAGNOSIS — L89213 Pressure ulcer of right hip, stage 3: Secondary | ICD-10-CM | POA: Diagnosis not present

## 2021-11-29 DIAGNOSIS — L89523 Pressure ulcer of left ankle, stage 3: Secondary | ICD-10-CM | POA: Diagnosis not present

## 2021-11-29 DIAGNOSIS — Z993 Dependence on wheelchair: Secondary | ICD-10-CM | POA: Diagnosis not present

## 2021-11-29 DIAGNOSIS — K222 Esophageal obstruction: Secondary | ICD-10-CM | POA: Diagnosis not present

## 2021-12-03 DIAGNOSIS — L89213 Pressure ulcer of right hip, stage 3: Secondary | ICD-10-CM | POA: Diagnosis not present

## 2021-12-03 DIAGNOSIS — K592 Neurogenic bowel, not elsewhere classified: Secondary | ICD-10-CM | POA: Diagnosis not present

## 2021-12-03 DIAGNOSIS — M549 Dorsalgia, unspecified: Secondary | ICD-10-CM | POA: Diagnosis not present

## 2021-12-03 DIAGNOSIS — L89154 Pressure ulcer of sacral region, stage 4: Secondary | ICD-10-CM | POA: Diagnosis not present

## 2021-12-03 DIAGNOSIS — I739 Peripheral vascular disease, unspecified: Secondary | ICD-10-CM | POA: Diagnosis not present

## 2021-12-03 DIAGNOSIS — G8929 Other chronic pain: Secondary | ICD-10-CM | POA: Diagnosis not present

## 2021-12-03 DIAGNOSIS — L89523 Pressure ulcer of left ankle, stage 3: Secondary | ICD-10-CM | POA: Diagnosis not present

## 2021-12-03 DIAGNOSIS — G5601 Carpal tunnel syndrome, right upper limb: Secondary | ICD-10-CM | POA: Diagnosis not present

## 2021-12-03 DIAGNOSIS — I10 Essential (primary) hypertension: Secondary | ICD-10-CM | POA: Diagnosis not present

## 2021-12-03 DIAGNOSIS — M654 Radial styloid tenosynovitis [de Quervain]: Secondary | ICD-10-CM | POA: Diagnosis not present

## 2021-12-03 DIAGNOSIS — R339 Retention of urine, unspecified: Secondary | ICD-10-CM | POA: Diagnosis not present

## 2021-12-03 DIAGNOSIS — E7800 Pure hypercholesterolemia, unspecified: Secondary | ICD-10-CM | POA: Diagnosis not present

## 2021-12-03 DIAGNOSIS — Z993 Dependence on wheelchair: Secondary | ICD-10-CM | POA: Diagnosis not present

## 2021-12-03 DIAGNOSIS — K222 Esophageal obstruction: Secondary | ICD-10-CM | POA: Diagnosis not present

## 2021-12-03 DIAGNOSIS — K219 Gastro-esophageal reflux disease without esophagitis: Secondary | ICD-10-CM | POA: Diagnosis not present

## 2021-12-03 DIAGNOSIS — Z9181 History of falling: Secondary | ICD-10-CM | POA: Diagnosis not present

## 2021-12-03 DIAGNOSIS — N319 Neuromuscular dysfunction of bladder, unspecified: Secondary | ICD-10-CM | POA: Diagnosis not present

## 2021-12-03 DIAGNOSIS — Z7902 Long term (current) use of antithrombotics/antiplatelets: Secondary | ICD-10-CM | POA: Diagnosis not present

## 2021-12-06 DIAGNOSIS — G5601 Carpal tunnel syndrome, right upper limb: Secondary | ICD-10-CM | POA: Diagnosis not present

## 2021-12-06 DIAGNOSIS — K222 Esophageal obstruction: Secondary | ICD-10-CM | POA: Diagnosis not present

## 2021-12-06 DIAGNOSIS — Z7902 Long term (current) use of antithrombotics/antiplatelets: Secondary | ICD-10-CM | POA: Diagnosis not present

## 2021-12-06 DIAGNOSIS — K592 Neurogenic bowel, not elsewhere classified: Secondary | ICD-10-CM | POA: Diagnosis not present

## 2021-12-06 DIAGNOSIS — L89523 Pressure ulcer of left ankle, stage 3: Secondary | ICD-10-CM | POA: Diagnosis not present

## 2021-12-06 DIAGNOSIS — L89213 Pressure ulcer of right hip, stage 3: Secondary | ICD-10-CM | POA: Diagnosis not present

## 2021-12-06 DIAGNOSIS — M549 Dorsalgia, unspecified: Secondary | ICD-10-CM | POA: Diagnosis not present

## 2021-12-06 DIAGNOSIS — I739 Peripheral vascular disease, unspecified: Secondary | ICD-10-CM | POA: Diagnosis not present

## 2021-12-06 DIAGNOSIS — E7800 Pure hypercholesterolemia, unspecified: Secondary | ICD-10-CM | POA: Diagnosis not present

## 2021-12-06 DIAGNOSIS — M654 Radial styloid tenosynovitis [de Quervain]: Secondary | ICD-10-CM | POA: Diagnosis not present

## 2021-12-06 DIAGNOSIS — Z9181 History of falling: Secondary | ICD-10-CM | POA: Diagnosis not present

## 2021-12-06 DIAGNOSIS — Z993 Dependence on wheelchair: Secondary | ICD-10-CM | POA: Diagnosis not present

## 2021-12-06 DIAGNOSIS — I10 Essential (primary) hypertension: Secondary | ICD-10-CM | POA: Diagnosis not present

## 2021-12-06 DIAGNOSIS — R339 Retention of urine, unspecified: Secondary | ICD-10-CM | POA: Diagnosis not present

## 2021-12-06 DIAGNOSIS — G8929 Other chronic pain: Secondary | ICD-10-CM | POA: Diagnosis not present

## 2021-12-06 DIAGNOSIS — K219 Gastro-esophageal reflux disease without esophagitis: Secondary | ICD-10-CM | POA: Diagnosis not present

## 2021-12-06 DIAGNOSIS — L89154 Pressure ulcer of sacral region, stage 4: Secondary | ICD-10-CM | POA: Diagnosis not present

## 2021-12-06 DIAGNOSIS — N319 Neuromuscular dysfunction of bladder, unspecified: Secondary | ICD-10-CM | POA: Diagnosis not present

## 2021-12-09 DIAGNOSIS — I739 Peripheral vascular disease, unspecified: Secondary | ICD-10-CM | POA: Diagnosis not present

## 2021-12-09 DIAGNOSIS — M549 Dorsalgia, unspecified: Secondary | ICD-10-CM | POA: Diagnosis not present

## 2021-12-09 DIAGNOSIS — K222 Esophageal obstruction: Secondary | ICD-10-CM | POA: Diagnosis not present

## 2021-12-09 DIAGNOSIS — M654 Radial styloid tenosynovitis [de Quervain]: Secondary | ICD-10-CM | POA: Diagnosis not present

## 2021-12-09 DIAGNOSIS — Z7902 Long term (current) use of antithrombotics/antiplatelets: Secondary | ICD-10-CM | POA: Diagnosis not present

## 2021-12-09 DIAGNOSIS — E7800 Pure hypercholesterolemia, unspecified: Secondary | ICD-10-CM | POA: Diagnosis not present

## 2021-12-09 DIAGNOSIS — K219 Gastro-esophageal reflux disease without esophagitis: Secondary | ICD-10-CM | POA: Diagnosis not present

## 2021-12-09 DIAGNOSIS — L89213 Pressure ulcer of right hip, stage 3: Secondary | ICD-10-CM | POA: Diagnosis not present

## 2021-12-09 DIAGNOSIS — L89523 Pressure ulcer of left ankle, stage 3: Secondary | ICD-10-CM | POA: Diagnosis not present

## 2021-12-09 DIAGNOSIS — Z993 Dependence on wheelchair: Secondary | ICD-10-CM | POA: Diagnosis not present

## 2021-12-09 DIAGNOSIS — Z9181 History of falling: Secondary | ICD-10-CM | POA: Diagnosis not present

## 2021-12-09 DIAGNOSIS — K592 Neurogenic bowel, not elsewhere classified: Secondary | ICD-10-CM | POA: Diagnosis not present

## 2021-12-09 DIAGNOSIS — R339 Retention of urine, unspecified: Secondary | ICD-10-CM | POA: Diagnosis not present

## 2021-12-09 DIAGNOSIS — I10 Essential (primary) hypertension: Secondary | ICD-10-CM | POA: Diagnosis not present

## 2021-12-09 DIAGNOSIS — L89154 Pressure ulcer of sacral region, stage 4: Secondary | ICD-10-CM | POA: Diagnosis not present

## 2021-12-09 DIAGNOSIS — G5601 Carpal tunnel syndrome, right upper limb: Secondary | ICD-10-CM | POA: Diagnosis not present

## 2021-12-09 DIAGNOSIS — N319 Neuromuscular dysfunction of bladder, unspecified: Secondary | ICD-10-CM | POA: Diagnosis not present

## 2021-12-09 DIAGNOSIS — G8929 Other chronic pain: Secondary | ICD-10-CM | POA: Diagnosis not present

## 2021-12-10 DIAGNOSIS — L89214 Pressure ulcer of right hip, stage 4: Secondary | ICD-10-CM | POA: Diagnosis not present

## 2021-12-10 DIAGNOSIS — L89154 Pressure ulcer of sacral region, stage 4: Secondary | ICD-10-CM | POA: Diagnosis not present

## 2021-12-10 DIAGNOSIS — L89510 Pressure ulcer of right ankle, unstageable: Secondary | ICD-10-CM | POA: Diagnosis not present

## 2021-12-10 DIAGNOSIS — L89523 Pressure ulcer of left ankle, stage 3: Secondary | ICD-10-CM | POA: Diagnosis not present

## 2021-12-16 DIAGNOSIS — Z7902 Long term (current) use of antithrombotics/antiplatelets: Secondary | ICD-10-CM | POA: Diagnosis not present

## 2021-12-16 DIAGNOSIS — N319 Neuromuscular dysfunction of bladder, unspecified: Secondary | ICD-10-CM | POA: Diagnosis not present

## 2021-12-16 DIAGNOSIS — K219 Gastro-esophageal reflux disease without esophagitis: Secondary | ICD-10-CM | POA: Diagnosis not present

## 2021-12-16 DIAGNOSIS — L89213 Pressure ulcer of right hip, stage 3: Secondary | ICD-10-CM | POA: Diagnosis not present

## 2021-12-16 DIAGNOSIS — M654 Radial styloid tenosynovitis [de Quervain]: Secondary | ICD-10-CM | POA: Diagnosis not present

## 2021-12-16 DIAGNOSIS — L89523 Pressure ulcer of left ankle, stage 3: Secondary | ICD-10-CM | POA: Diagnosis not present

## 2021-12-16 DIAGNOSIS — I739 Peripheral vascular disease, unspecified: Secondary | ICD-10-CM | POA: Diagnosis not present

## 2021-12-16 DIAGNOSIS — I10 Essential (primary) hypertension: Secondary | ICD-10-CM | POA: Diagnosis not present

## 2021-12-16 DIAGNOSIS — R339 Retention of urine, unspecified: Secondary | ICD-10-CM | POA: Diagnosis not present

## 2021-12-16 DIAGNOSIS — Z9181 History of falling: Secondary | ICD-10-CM | POA: Diagnosis not present

## 2021-12-16 DIAGNOSIS — G5601 Carpal tunnel syndrome, right upper limb: Secondary | ICD-10-CM | POA: Diagnosis not present

## 2021-12-16 DIAGNOSIS — K592 Neurogenic bowel, not elsewhere classified: Secondary | ICD-10-CM | POA: Diagnosis not present

## 2021-12-16 DIAGNOSIS — L89154 Pressure ulcer of sacral region, stage 4: Secondary | ICD-10-CM | POA: Diagnosis not present

## 2021-12-16 DIAGNOSIS — E7800 Pure hypercholesterolemia, unspecified: Secondary | ICD-10-CM | POA: Diagnosis not present

## 2021-12-16 DIAGNOSIS — Z993 Dependence on wheelchair: Secondary | ICD-10-CM | POA: Diagnosis not present

## 2021-12-16 DIAGNOSIS — M549 Dorsalgia, unspecified: Secondary | ICD-10-CM | POA: Diagnosis not present

## 2021-12-16 DIAGNOSIS — G8929 Other chronic pain: Secondary | ICD-10-CM | POA: Diagnosis not present

## 2021-12-16 DIAGNOSIS — K222 Esophageal obstruction: Secondary | ICD-10-CM | POA: Diagnosis not present

## 2021-12-17 DIAGNOSIS — I739 Peripheral vascular disease, unspecified: Secondary | ICD-10-CM | POA: Diagnosis not present

## 2021-12-17 DIAGNOSIS — L97519 Non-pressure chronic ulcer of other part of right foot with unspecified severity: Secondary | ICD-10-CM | POA: Diagnosis not present

## 2021-12-17 DIAGNOSIS — I10 Essential (primary) hypertension: Secondary | ICD-10-CM | POA: Diagnosis not present

## 2021-12-17 DIAGNOSIS — L89214 Pressure ulcer of right hip, stage 4: Secondary | ICD-10-CM | POA: Diagnosis not present

## 2021-12-17 DIAGNOSIS — E7800 Pure hypercholesterolemia, unspecified: Secondary | ICD-10-CM | POA: Diagnosis not present

## 2021-12-17 DIAGNOSIS — G8220 Paraplegia, unspecified: Secondary | ICD-10-CM | POA: Diagnosis not present

## 2021-12-17 DIAGNOSIS — G8929 Other chronic pain: Secondary | ICD-10-CM | POA: Diagnosis not present

## 2021-12-17 DIAGNOSIS — L89523 Pressure ulcer of left ankle, stage 3: Secondary | ICD-10-CM | POA: Diagnosis not present

## 2021-12-17 DIAGNOSIS — F17210 Nicotine dependence, cigarettes, uncomplicated: Secondary | ICD-10-CM | POA: Diagnosis not present

## 2021-12-17 DIAGNOSIS — Z79899 Other long term (current) drug therapy: Secondary | ICD-10-CM | POA: Diagnosis not present

## 2021-12-17 DIAGNOSIS — L89159 Pressure ulcer of sacral region, unspecified stage: Secondary | ICD-10-CM | POA: Diagnosis not present

## 2021-12-17 DIAGNOSIS — M549 Dorsalgia, unspecified: Secondary | ICD-10-CM | POA: Diagnosis not present

## 2021-12-17 DIAGNOSIS — Z7902 Long term (current) use of antithrombotics/antiplatelets: Secondary | ICD-10-CM | POA: Diagnosis not present

## 2021-12-17 DIAGNOSIS — L89154 Pressure ulcer of sacral region, stage 4: Secondary | ICD-10-CM | POA: Diagnosis not present

## 2021-12-17 DIAGNOSIS — L97529 Non-pressure chronic ulcer of other part of left foot with unspecified severity: Secondary | ICD-10-CM | POA: Diagnosis not present

## 2021-12-19 DIAGNOSIS — K219 Gastro-esophageal reflux disease without esophagitis: Secondary | ICD-10-CM | POA: Diagnosis not present

## 2021-12-19 DIAGNOSIS — L89213 Pressure ulcer of right hip, stage 3: Secondary | ICD-10-CM | POA: Diagnosis not present

## 2021-12-19 DIAGNOSIS — Z7902 Long term (current) use of antithrombotics/antiplatelets: Secondary | ICD-10-CM | POA: Diagnosis not present

## 2021-12-19 DIAGNOSIS — Z993 Dependence on wheelchair: Secondary | ICD-10-CM | POA: Diagnosis not present

## 2021-12-19 DIAGNOSIS — E7800 Pure hypercholesterolemia, unspecified: Secondary | ICD-10-CM | POA: Diagnosis not present

## 2021-12-19 DIAGNOSIS — I10 Essential (primary) hypertension: Secondary | ICD-10-CM | POA: Diagnosis not present

## 2021-12-19 DIAGNOSIS — I739 Peripheral vascular disease, unspecified: Secondary | ICD-10-CM | POA: Diagnosis not present

## 2021-12-19 DIAGNOSIS — M654 Radial styloid tenosynovitis [de Quervain]: Secondary | ICD-10-CM | POA: Diagnosis not present

## 2021-12-19 DIAGNOSIS — Z9181 History of falling: Secondary | ICD-10-CM | POA: Diagnosis not present

## 2021-12-19 DIAGNOSIS — N319 Neuromuscular dysfunction of bladder, unspecified: Secondary | ICD-10-CM | POA: Diagnosis not present

## 2021-12-19 DIAGNOSIS — K222 Esophageal obstruction: Secondary | ICD-10-CM | POA: Diagnosis not present

## 2021-12-19 DIAGNOSIS — L89523 Pressure ulcer of left ankle, stage 3: Secondary | ICD-10-CM | POA: Diagnosis not present

## 2021-12-19 DIAGNOSIS — L89154 Pressure ulcer of sacral region, stage 4: Secondary | ICD-10-CM | POA: Diagnosis not present

## 2021-12-19 DIAGNOSIS — K592 Neurogenic bowel, not elsewhere classified: Secondary | ICD-10-CM | POA: Diagnosis not present

## 2021-12-19 DIAGNOSIS — R339 Retention of urine, unspecified: Secondary | ICD-10-CM | POA: Diagnosis not present

## 2021-12-19 DIAGNOSIS — M549 Dorsalgia, unspecified: Secondary | ICD-10-CM | POA: Diagnosis not present

## 2021-12-19 DIAGNOSIS — G8929 Other chronic pain: Secondary | ICD-10-CM | POA: Diagnosis not present

## 2021-12-19 DIAGNOSIS — G5601 Carpal tunnel syndrome, right upper limb: Secondary | ICD-10-CM | POA: Diagnosis not present

## 2021-12-24 DIAGNOSIS — K222 Esophageal obstruction: Secondary | ICD-10-CM | POA: Diagnosis not present

## 2021-12-24 DIAGNOSIS — K219 Gastro-esophageal reflux disease without esophagitis: Secondary | ICD-10-CM | POA: Diagnosis not present

## 2021-12-24 DIAGNOSIS — M549 Dorsalgia, unspecified: Secondary | ICD-10-CM | POA: Diagnosis not present

## 2021-12-24 DIAGNOSIS — M654 Radial styloid tenosynovitis [de Quervain]: Secondary | ICD-10-CM | POA: Diagnosis not present

## 2021-12-24 DIAGNOSIS — N319 Neuromuscular dysfunction of bladder, unspecified: Secondary | ICD-10-CM | POA: Diagnosis not present

## 2021-12-24 DIAGNOSIS — Z9181 History of falling: Secondary | ICD-10-CM | POA: Diagnosis not present

## 2021-12-24 DIAGNOSIS — Z993 Dependence on wheelchair: Secondary | ICD-10-CM | POA: Diagnosis not present

## 2021-12-24 DIAGNOSIS — L89154 Pressure ulcer of sacral region, stage 4: Secondary | ICD-10-CM | POA: Diagnosis not present

## 2021-12-24 DIAGNOSIS — G8929 Other chronic pain: Secondary | ICD-10-CM | POA: Diagnosis not present

## 2021-12-24 DIAGNOSIS — I10 Essential (primary) hypertension: Secondary | ICD-10-CM | POA: Diagnosis not present

## 2021-12-24 DIAGNOSIS — L89213 Pressure ulcer of right hip, stage 3: Secondary | ICD-10-CM | POA: Diagnosis not present

## 2021-12-24 DIAGNOSIS — G5601 Carpal tunnel syndrome, right upper limb: Secondary | ICD-10-CM | POA: Diagnosis not present

## 2021-12-24 DIAGNOSIS — Z7902 Long term (current) use of antithrombotics/antiplatelets: Secondary | ICD-10-CM | POA: Diagnosis not present

## 2021-12-24 DIAGNOSIS — K592 Neurogenic bowel, not elsewhere classified: Secondary | ICD-10-CM | POA: Diagnosis not present

## 2021-12-24 DIAGNOSIS — R339 Retention of urine, unspecified: Secondary | ICD-10-CM | POA: Diagnosis not present

## 2021-12-24 DIAGNOSIS — I739 Peripheral vascular disease, unspecified: Secondary | ICD-10-CM | POA: Diagnosis not present

## 2021-12-24 DIAGNOSIS — E7800 Pure hypercholesterolemia, unspecified: Secondary | ICD-10-CM | POA: Diagnosis not present

## 2021-12-24 DIAGNOSIS — L89523 Pressure ulcer of left ankle, stage 3: Secondary | ICD-10-CM | POA: Diagnosis not present

## 2021-12-27 DIAGNOSIS — E7800 Pure hypercholesterolemia, unspecified: Secondary | ICD-10-CM | POA: Diagnosis not present

## 2021-12-27 DIAGNOSIS — M654 Radial styloid tenosynovitis [de Quervain]: Secondary | ICD-10-CM | POA: Diagnosis not present

## 2021-12-27 DIAGNOSIS — Z9181 History of falling: Secondary | ICD-10-CM | POA: Diagnosis not present

## 2021-12-27 DIAGNOSIS — Z993 Dependence on wheelchair: Secondary | ICD-10-CM | POA: Diagnosis not present

## 2021-12-27 DIAGNOSIS — L89154 Pressure ulcer of sacral region, stage 4: Secondary | ICD-10-CM | POA: Diagnosis not present

## 2021-12-27 DIAGNOSIS — L89523 Pressure ulcer of left ankle, stage 3: Secondary | ICD-10-CM | POA: Diagnosis not present

## 2021-12-27 DIAGNOSIS — G8929 Other chronic pain: Secondary | ICD-10-CM | POA: Diagnosis not present

## 2021-12-27 DIAGNOSIS — K592 Neurogenic bowel, not elsewhere classified: Secondary | ICD-10-CM | POA: Diagnosis not present

## 2021-12-27 DIAGNOSIS — G5601 Carpal tunnel syndrome, right upper limb: Secondary | ICD-10-CM | POA: Diagnosis not present

## 2021-12-27 DIAGNOSIS — L89213 Pressure ulcer of right hip, stage 3: Secondary | ICD-10-CM | POA: Diagnosis not present

## 2021-12-27 DIAGNOSIS — Z7902 Long term (current) use of antithrombotics/antiplatelets: Secondary | ICD-10-CM | POA: Diagnosis not present

## 2021-12-27 DIAGNOSIS — M549 Dorsalgia, unspecified: Secondary | ICD-10-CM | POA: Diagnosis not present

## 2021-12-27 DIAGNOSIS — N319 Neuromuscular dysfunction of bladder, unspecified: Secondary | ICD-10-CM | POA: Diagnosis not present

## 2021-12-27 DIAGNOSIS — I739 Peripheral vascular disease, unspecified: Secondary | ICD-10-CM | POA: Diagnosis not present

## 2021-12-27 DIAGNOSIS — K222 Esophageal obstruction: Secondary | ICD-10-CM | POA: Diagnosis not present

## 2021-12-27 DIAGNOSIS — I10 Essential (primary) hypertension: Secondary | ICD-10-CM | POA: Diagnosis not present

## 2021-12-27 DIAGNOSIS — R339 Retention of urine, unspecified: Secondary | ICD-10-CM | POA: Diagnosis not present

## 2021-12-27 DIAGNOSIS — K219 Gastro-esophageal reflux disease without esophagitis: Secondary | ICD-10-CM | POA: Diagnosis not present

## 2021-12-28 DIAGNOSIS — L89154 Pressure ulcer of sacral region, stage 4: Secondary | ICD-10-CM | POA: Diagnosis not present

## 2021-12-30 DIAGNOSIS — K592 Neurogenic bowel, not elsewhere classified: Secondary | ICD-10-CM | POA: Diagnosis not present

## 2021-12-30 DIAGNOSIS — E7800 Pure hypercholesterolemia, unspecified: Secondary | ICD-10-CM | POA: Diagnosis not present

## 2021-12-30 DIAGNOSIS — L89154 Pressure ulcer of sacral region, stage 4: Secondary | ICD-10-CM | POA: Diagnosis not present

## 2021-12-30 DIAGNOSIS — Z9181 History of falling: Secondary | ICD-10-CM | POA: Diagnosis not present

## 2021-12-30 DIAGNOSIS — Z7902 Long term (current) use of antithrombotics/antiplatelets: Secondary | ICD-10-CM | POA: Diagnosis not present

## 2021-12-30 DIAGNOSIS — L89213 Pressure ulcer of right hip, stage 3: Secondary | ICD-10-CM | POA: Diagnosis not present

## 2021-12-30 DIAGNOSIS — M549 Dorsalgia, unspecified: Secondary | ICD-10-CM | POA: Diagnosis not present

## 2021-12-30 DIAGNOSIS — L89523 Pressure ulcer of left ankle, stage 3: Secondary | ICD-10-CM | POA: Diagnosis not present

## 2021-12-30 DIAGNOSIS — G8929 Other chronic pain: Secondary | ICD-10-CM | POA: Diagnosis not present

## 2021-12-30 DIAGNOSIS — I739 Peripheral vascular disease, unspecified: Secondary | ICD-10-CM | POA: Diagnosis not present

## 2021-12-30 DIAGNOSIS — G5601 Carpal tunnel syndrome, right upper limb: Secondary | ICD-10-CM | POA: Diagnosis not present

## 2021-12-30 DIAGNOSIS — K219 Gastro-esophageal reflux disease without esophagitis: Secondary | ICD-10-CM | POA: Diagnosis not present

## 2021-12-30 DIAGNOSIS — K222 Esophageal obstruction: Secondary | ICD-10-CM | POA: Diagnosis not present

## 2021-12-30 DIAGNOSIS — R339 Retention of urine, unspecified: Secondary | ICD-10-CM | POA: Diagnosis not present

## 2021-12-30 DIAGNOSIS — I10 Essential (primary) hypertension: Secondary | ICD-10-CM | POA: Diagnosis not present

## 2021-12-30 DIAGNOSIS — Z993 Dependence on wheelchair: Secondary | ICD-10-CM | POA: Diagnosis not present

## 2021-12-30 DIAGNOSIS — N319 Neuromuscular dysfunction of bladder, unspecified: Secondary | ICD-10-CM | POA: Diagnosis not present

## 2021-12-30 DIAGNOSIS — M654 Radial styloid tenosynovitis [de Quervain]: Secondary | ICD-10-CM | POA: Diagnosis not present

## 2021-12-31 DIAGNOSIS — L97529 Non-pressure chronic ulcer of other part of left foot with unspecified severity: Secondary | ICD-10-CM | POA: Diagnosis not present

## 2021-12-31 DIAGNOSIS — E7800 Pure hypercholesterolemia, unspecified: Secondary | ICD-10-CM | POA: Diagnosis not present

## 2021-12-31 DIAGNOSIS — G8220 Paraplegia, unspecified: Secondary | ICD-10-CM | POA: Diagnosis not present

## 2021-12-31 DIAGNOSIS — L97519 Non-pressure chronic ulcer of other part of right foot with unspecified severity: Secondary | ICD-10-CM | POA: Diagnosis not present

## 2021-12-31 DIAGNOSIS — L89159 Pressure ulcer of sacral region, unspecified stage: Secondary | ICD-10-CM | POA: Diagnosis not present

## 2021-12-31 DIAGNOSIS — I739 Peripheral vascular disease, unspecified: Secondary | ICD-10-CM | POA: Diagnosis not present

## 2021-12-31 DIAGNOSIS — G8929 Other chronic pain: Secondary | ICD-10-CM | POA: Diagnosis not present

## 2021-12-31 DIAGNOSIS — I10 Essential (primary) hypertension: Secondary | ICD-10-CM | POA: Diagnosis not present

## 2021-12-31 DIAGNOSIS — Z79899 Other long term (current) drug therapy: Secondary | ICD-10-CM | POA: Diagnosis not present

## 2021-12-31 DIAGNOSIS — L89523 Pressure ulcer of left ankle, stage 3: Secondary | ICD-10-CM | POA: Diagnosis not present

## 2021-12-31 DIAGNOSIS — L89154 Pressure ulcer of sacral region, stage 4: Secondary | ICD-10-CM | POA: Diagnosis not present

## 2021-12-31 DIAGNOSIS — F17210 Nicotine dependence, cigarettes, uncomplicated: Secondary | ICD-10-CM | POA: Diagnosis not present

## 2021-12-31 DIAGNOSIS — M549 Dorsalgia, unspecified: Secondary | ICD-10-CM | POA: Diagnosis not present

## 2021-12-31 DIAGNOSIS — Z7902 Long term (current) use of antithrombotics/antiplatelets: Secondary | ICD-10-CM | POA: Diagnosis not present

## 2021-12-31 DIAGNOSIS — L89214 Pressure ulcer of right hip, stage 4: Secondary | ICD-10-CM | POA: Diagnosis not present

## 2022-01-01 DIAGNOSIS — G5602 Carpal tunnel syndrome, left upper limb: Secondary | ICD-10-CM | POA: Diagnosis not present

## 2022-01-02 DIAGNOSIS — G5601 Carpal tunnel syndrome, right upper limb: Secondary | ICD-10-CM | POA: Diagnosis not present

## 2022-01-02 DIAGNOSIS — Z993 Dependence on wheelchair: Secondary | ICD-10-CM | POA: Diagnosis not present

## 2022-01-02 DIAGNOSIS — G8929 Other chronic pain: Secondary | ICD-10-CM | POA: Diagnosis not present

## 2022-01-02 DIAGNOSIS — L89523 Pressure ulcer of left ankle, stage 3: Secondary | ICD-10-CM | POA: Diagnosis not present

## 2022-01-02 DIAGNOSIS — N319 Neuromuscular dysfunction of bladder, unspecified: Secondary | ICD-10-CM | POA: Diagnosis not present

## 2022-01-02 DIAGNOSIS — M654 Radial styloid tenosynovitis [de Quervain]: Secondary | ICD-10-CM | POA: Diagnosis not present

## 2022-01-02 DIAGNOSIS — K219 Gastro-esophageal reflux disease without esophagitis: Secondary | ICD-10-CM | POA: Diagnosis not present

## 2022-01-02 DIAGNOSIS — K222 Esophageal obstruction: Secondary | ICD-10-CM | POA: Diagnosis not present

## 2022-01-02 DIAGNOSIS — E7800 Pure hypercholesterolemia, unspecified: Secondary | ICD-10-CM | POA: Diagnosis not present

## 2022-01-02 DIAGNOSIS — L89154 Pressure ulcer of sacral region, stage 4: Secondary | ICD-10-CM | POA: Diagnosis not present

## 2022-01-02 DIAGNOSIS — R339 Retention of urine, unspecified: Secondary | ICD-10-CM | POA: Diagnosis not present

## 2022-01-02 DIAGNOSIS — Z9181 History of falling: Secondary | ICD-10-CM | POA: Diagnosis not present

## 2022-01-02 DIAGNOSIS — Z7902 Long term (current) use of antithrombotics/antiplatelets: Secondary | ICD-10-CM | POA: Diagnosis not present

## 2022-01-02 DIAGNOSIS — L89213 Pressure ulcer of right hip, stage 3: Secondary | ICD-10-CM | POA: Diagnosis not present

## 2022-01-02 DIAGNOSIS — M549 Dorsalgia, unspecified: Secondary | ICD-10-CM | POA: Diagnosis not present

## 2022-01-02 DIAGNOSIS — I739 Peripheral vascular disease, unspecified: Secondary | ICD-10-CM | POA: Diagnosis not present

## 2022-01-02 DIAGNOSIS — K592 Neurogenic bowel, not elsewhere classified: Secondary | ICD-10-CM | POA: Diagnosis not present

## 2022-01-02 DIAGNOSIS — I10 Essential (primary) hypertension: Secondary | ICD-10-CM | POA: Diagnosis not present

## 2022-01-06 DIAGNOSIS — G8929 Other chronic pain: Secondary | ICD-10-CM | POA: Diagnosis not present

## 2022-01-06 DIAGNOSIS — Z9181 History of falling: Secondary | ICD-10-CM | POA: Diagnosis not present

## 2022-01-06 DIAGNOSIS — L89523 Pressure ulcer of left ankle, stage 3: Secondary | ICD-10-CM | POA: Diagnosis not present

## 2022-01-06 DIAGNOSIS — L89154 Pressure ulcer of sacral region, stage 4: Secondary | ICD-10-CM | POA: Diagnosis not present

## 2022-01-06 DIAGNOSIS — M549 Dorsalgia, unspecified: Secondary | ICD-10-CM | POA: Diagnosis not present

## 2022-01-06 DIAGNOSIS — N319 Neuromuscular dysfunction of bladder, unspecified: Secondary | ICD-10-CM | POA: Diagnosis not present

## 2022-01-06 DIAGNOSIS — R339 Retention of urine, unspecified: Secondary | ICD-10-CM | POA: Diagnosis not present

## 2022-01-06 DIAGNOSIS — L89213 Pressure ulcer of right hip, stage 3: Secondary | ICD-10-CM | POA: Diagnosis not present

## 2022-01-06 DIAGNOSIS — K222 Esophageal obstruction: Secondary | ICD-10-CM | POA: Diagnosis not present

## 2022-01-06 DIAGNOSIS — K219 Gastro-esophageal reflux disease without esophagitis: Secondary | ICD-10-CM | POA: Diagnosis not present

## 2022-01-06 DIAGNOSIS — Z993 Dependence on wheelchair: Secondary | ICD-10-CM | POA: Diagnosis not present

## 2022-01-06 DIAGNOSIS — I10 Essential (primary) hypertension: Secondary | ICD-10-CM | POA: Diagnosis not present

## 2022-01-06 DIAGNOSIS — Z7902 Long term (current) use of antithrombotics/antiplatelets: Secondary | ICD-10-CM | POA: Diagnosis not present

## 2022-01-06 DIAGNOSIS — I739 Peripheral vascular disease, unspecified: Secondary | ICD-10-CM | POA: Diagnosis not present

## 2022-01-06 DIAGNOSIS — K592 Neurogenic bowel, not elsewhere classified: Secondary | ICD-10-CM | POA: Diagnosis not present

## 2022-01-06 DIAGNOSIS — G5601 Carpal tunnel syndrome, right upper limb: Secondary | ICD-10-CM | POA: Diagnosis not present

## 2022-01-06 DIAGNOSIS — E7800 Pure hypercholesterolemia, unspecified: Secondary | ICD-10-CM | POA: Diagnosis not present

## 2022-01-06 DIAGNOSIS — M654 Radial styloid tenosynovitis [de Quervain]: Secondary | ICD-10-CM | POA: Diagnosis not present

## 2022-01-10 DIAGNOSIS — L89523 Pressure ulcer of left ankle, stage 3: Secondary | ICD-10-CM | POA: Diagnosis not present

## 2022-01-10 DIAGNOSIS — K219 Gastro-esophageal reflux disease without esophagitis: Secondary | ICD-10-CM | POA: Diagnosis not present

## 2022-01-10 DIAGNOSIS — E7800 Pure hypercholesterolemia, unspecified: Secondary | ICD-10-CM | POA: Diagnosis not present

## 2022-01-10 DIAGNOSIS — K222 Esophageal obstruction: Secondary | ICD-10-CM | POA: Diagnosis not present

## 2022-01-10 DIAGNOSIS — N319 Neuromuscular dysfunction of bladder, unspecified: Secondary | ICD-10-CM | POA: Diagnosis not present

## 2022-01-10 DIAGNOSIS — L89213 Pressure ulcer of right hip, stage 3: Secondary | ICD-10-CM | POA: Diagnosis not present

## 2022-01-10 DIAGNOSIS — Z7902 Long term (current) use of antithrombotics/antiplatelets: Secondary | ICD-10-CM | POA: Diagnosis not present

## 2022-01-10 DIAGNOSIS — Z993 Dependence on wheelchair: Secondary | ICD-10-CM | POA: Diagnosis not present

## 2022-01-10 DIAGNOSIS — Z9181 History of falling: Secondary | ICD-10-CM | POA: Diagnosis not present

## 2022-01-10 DIAGNOSIS — R339 Retention of urine, unspecified: Secondary | ICD-10-CM | POA: Diagnosis not present

## 2022-01-10 DIAGNOSIS — M654 Radial styloid tenosynovitis [de Quervain]: Secondary | ICD-10-CM | POA: Diagnosis not present

## 2022-01-10 DIAGNOSIS — I10 Essential (primary) hypertension: Secondary | ICD-10-CM | POA: Diagnosis not present

## 2022-01-10 DIAGNOSIS — G8929 Other chronic pain: Secondary | ICD-10-CM | POA: Diagnosis not present

## 2022-01-10 DIAGNOSIS — M549 Dorsalgia, unspecified: Secondary | ICD-10-CM | POA: Diagnosis not present

## 2022-01-10 DIAGNOSIS — L89154 Pressure ulcer of sacral region, stage 4: Secondary | ICD-10-CM | POA: Diagnosis not present

## 2022-01-10 DIAGNOSIS — G5601 Carpal tunnel syndrome, right upper limb: Secondary | ICD-10-CM | POA: Diagnosis not present

## 2022-01-10 DIAGNOSIS — K592 Neurogenic bowel, not elsewhere classified: Secondary | ICD-10-CM | POA: Diagnosis not present

## 2022-01-10 DIAGNOSIS — I739 Peripheral vascular disease, unspecified: Secondary | ICD-10-CM | POA: Diagnosis not present

## 2022-01-14 DIAGNOSIS — R339 Retention of urine, unspecified: Secondary | ICD-10-CM | POA: Diagnosis not present

## 2022-01-14 DIAGNOSIS — L89523 Pressure ulcer of left ankle, stage 3: Secondary | ICD-10-CM | POA: Diagnosis not present

## 2022-01-14 DIAGNOSIS — Z9181 History of falling: Secondary | ICD-10-CM | POA: Diagnosis not present

## 2022-01-14 DIAGNOSIS — G5601 Carpal tunnel syndrome, right upper limb: Secondary | ICD-10-CM | POA: Diagnosis not present

## 2022-01-14 DIAGNOSIS — E7800 Pure hypercholesterolemia, unspecified: Secondary | ICD-10-CM | POA: Diagnosis not present

## 2022-01-14 DIAGNOSIS — M549 Dorsalgia, unspecified: Secondary | ICD-10-CM | POA: Diagnosis not present

## 2022-01-14 DIAGNOSIS — K592 Neurogenic bowel, not elsewhere classified: Secondary | ICD-10-CM | POA: Diagnosis not present

## 2022-01-14 DIAGNOSIS — I739 Peripheral vascular disease, unspecified: Secondary | ICD-10-CM | POA: Diagnosis not present

## 2022-01-14 DIAGNOSIS — L89213 Pressure ulcer of right hip, stage 3: Secondary | ICD-10-CM | POA: Diagnosis not present

## 2022-01-14 DIAGNOSIS — Z7902 Long term (current) use of antithrombotics/antiplatelets: Secondary | ICD-10-CM | POA: Diagnosis not present

## 2022-01-14 DIAGNOSIS — N319 Neuromuscular dysfunction of bladder, unspecified: Secondary | ICD-10-CM | POA: Diagnosis not present

## 2022-01-14 DIAGNOSIS — K219 Gastro-esophageal reflux disease without esophagitis: Secondary | ICD-10-CM | POA: Diagnosis not present

## 2022-01-14 DIAGNOSIS — L89154 Pressure ulcer of sacral region, stage 4: Secondary | ICD-10-CM | POA: Diagnosis not present

## 2022-01-14 DIAGNOSIS — I10 Essential (primary) hypertension: Secondary | ICD-10-CM | POA: Diagnosis not present

## 2022-01-14 DIAGNOSIS — Z993 Dependence on wheelchair: Secondary | ICD-10-CM | POA: Diagnosis not present

## 2022-01-14 DIAGNOSIS — G8929 Other chronic pain: Secondary | ICD-10-CM | POA: Diagnosis not present

## 2022-01-14 DIAGNOSIS — K222 Esophageal obstruction: Secondary | ICD-10-CM | POA: Diagnosis not present

## 2022-01-14 DIAGNOSIS — M654 Radial styloid tenosynovitis [de Quervain]: Secondary | ICD-10-CM | POA: Diagnosis not present

## 2022-01-17 DIAGNOSIS — L89523 Pressure ulcer of left ankle, stage 3: Secondary | ICD-10-CM | POA: Diagnosis not present

## 2022-01-17 DIAGNOSIS — Z7902 Long term (current) use of antithrombotics/antiplatelets: Secondary | ICD-10-CM | POA: Diagnosis not present

## 2022-01-17 DIAGNOSIS — M549 Dorsalgia, unspecified: Secondary | ICD-10-CM | POA: Diagnosis not present

## 2022-01-17 DIAGNOSIS — G8929 Other chronic pain: Secondary | ICD-10-CM | POA: Diagnosis not present

## 2022-01-17 DIAGNOSIS — G5601 Carpal tunnel syndrome, right upper limb: Secondary | ICD-10-CM | POA: Diagnosis not present

## 2022-01-17 DIAGNOSIS — Z9181 History of falling: Secondary | ICD-10-CM | POA: Diagnosis not present

## 2022-01-17 DIAGNOSIS — Z993 Dependence on wheelchair: Secondary | ICD-10-CM | POA: Diagnosis not present

## 2022-01-17 DIAGNOSIS — L89213 Pressure ulcer of right hip, stage 3: Secondary | ICD-10-CM | POA: Diagnosis not present

## 2022-01-17 DIAGNOSIS — K222 Esophageal obstruction: Secondary | ICD-10-CM | POA: Diagnosis not present

## 2022-01-17 DIAGNOSIS — L89154 Pressure ulcer of sacral region, stage 4: Secondary | ICD-10-CM | POA: Diagnosis not present

## 2022-01-17 DIAGNOSIS — E7800 Pure hypercholesterolemia, unspecified: Secondary | ICD-10-CM | POA: Diagnosis not present

## 2022-01-17 DIAGNOSIS — K592 Neurogenic bowel, not elsewhere classified: Secondary | ICD-10-CM | POA: Diagnosis not present

## 2022-01-17 DIAGNOSIS — I739 Peripheral vascular disease, unspecified: Secondary | ICD-10-CM | POA: Diagnosis not present

## 2022-01-17 DIAGNOSIS — I10 Essential (primary) hypertension: Secondary | ICD-10-CM | POA: Diagnosis not present

## 2022-01-17 DIAGNOSIS — N319 Neuromuscular dysfunction of bladder, unspecified: Secondary | ICD-10-CM | POA: Diagnosis not present

## 2022-01-17 DIAGNOSIS — R339 Retention of urine, unspecified: Secondary | ICD-10-CM | POA: Diagnosis not present

## 2022-01-17 DIAGNOSIS — M654 Radial styloid tenosynovitis [de Quervain]: Secondary | ICD-10-CM | POA: Diagnosis not present

## 2022-01-17 DIAGNOSIS — K219 Gastro-esophageal reflux disease without esophagitis: Secondary | ICD-10-CM | POA: Diagnosis not present

## 2022-01-20 DIAGNOSIS — K219 Gastro-esophageal reflux disease without esophagitis: Secondary | ICD-10-CM | POA: Diagnosis not present

## 2022-01-20 DIAGNOSIS — L89213 Pressure ulcer of right hip, stage 3: Secondary | ICD-10-CM | POA: Diagnosis not present

## 2022-01-20 DIAGNOSIS — G8929 Other chronic pain: Secondary | ICD-10-CM | POA: Diagnosis not present

## 2022-01-20 DIAGNOSIS — L89154 Pressure ulcer of sacral region, stage 4: Secondary | ICD-10-CM | POA: Diagnosis not present

## 2022-01-20 DIAGNOSIS — Z7902 Long term (current) use of antithrombotics/antiplatelets: Secondary | ICD-10-CM | POA: Diagnosis not present

## 2022-01-20 DIAGNOSIS — K222 Esophageal obstruction: Secondary | ICD-10-CM | POA: Diagnosis not present

## 2022-01-20 DIAGNOSIS — I10 Essential (primary) hypertension: Secondary | ICD-10-CM | POA: Diagnosis not present

## 2022-01-20 DIAGNOSIS — G5601 Carpal tunnel syndrome, right upper limb: Secondary | ICD-10-CM | POA: Diagnosis not present

## 2022-01-20 DIAGNOSIS — Z993 Dependence on wheelchair: Secondary | ICD-10-CM | POA: Diagnosis not present

## 2022-01-20 DIAGNOSIS — K592 Neurogenic bowel, not elsewhere classified: Secondary | ICD-10-CM | POA: Diagnosis not present

## 2022-01-20 DIAGNOSIS — I739 Peripheral vascular disease, unspecified: Secondary | ICD-10-CM | POA: Diagnosis not present

## 2022-01-20 DIAGNOSIS — R339 Retention of urine, unspecified: Secondary | ICD-10-CM | POA: Diagnosis not present

## 2022-01-20 DIAGNOSIS — M549 Dorsalgia, unspecified: Secondary | ICD-10-CM | POA: Diagnosis not present

## 2022-01-20 DIAGNOSIS — M654 Radial styloid tenosynovitis [de Quervain]: Secondary | ICD-10-CM | POA: Diagnosis not present

## 2022-01-20 DIAGNOSIS — L89523 Pressure ulcer of left ankle, stage 3: Secondary | ICD-10-CM | POA: Diagnosis not present

## 2022-01-20 DIAGNOSIS — E7800 Pure hypercholesterolemia, unspecified: Secondary | ICD-10-CM | POA: Diagnosis not present

## 2022-01-20 DIAGNOSIS — N319 Neuromuscular dysfunction of bladder, unspecified: Secondary | ICD-10-CM | POA: Diagnosis not present

## 2022-01-20 DIAGNOSIS — Z9181 History of falling: Secondary | ICD-10-CM | POA: Diagnosis not present

## 2022-01-23 DIAGNOSIS — N390 Urinary tract infection, site not specified: Secondary | ICD-10-CM | POA: Diagnosis not present

## 2022-01-23 DIAGNOSIS — N319 Neuromuscular dysfunction of bladder, unspecified: Secondary | ICD-10-CM | POA: Diagnosis not present

## 2022-01-23 DIAGNOSIS — R339 Retention of urine, unspecified: Secondary | ICD-10-CM | POA: Diagnosis not present

## 2022-01-24 DIAGNOSIS — M654 Radial styloid tenosynovitis [de Quervain]: Secondary | ICD-10-CM | POA: Diagnosis not present

## 2022-01-24 DIAGNOSIS — G8929 Other chronic pain: Secondary | ICD-10-CM | POA: Diagnosis not present

## 2022-01-24 DIAGNOSIS — Z993 Dependence on wheelchair: Secondary | ICD-10-CM | POA: Diagnosis not present

## 2022-01-24 DIAGNOSIS — L89213 Pressure ulcer of right hip, stage 3: Secondary | ICD-10-CM | POA: Diagnosis not present

## 2022-01-24 DIAGNOSIS — I10 Essential (primary) hypertension: Secondary | ICD-10-CM | POA: Diagnosis not present

## 2022-01-24 DIAGNOSIS — I739 Peripheral vascular disease, unspecified: Secondary | ICD-10-CM | POA: Diagnosis not present

## 2022-01-24 DIAGNOSIS — R339 Retention of urine, unspecified: Secondary | ICD-10-CM | POA: Diagnosis not present

## 2022-01-24 DIAGNOSIS — M549 Dorsalgia, unspecified: Secondary | ICD-10-CM | POA: Diagnosis not present

## 2022-01-24 DIAGNOSIS — K219 Gastro-esophageal reflux disease without esophagitis: Secondary | ICD-10-CM | POA: Diagnosis not present

## 2022-01-24 DIAGNOSIS — L89523 Pressure ulcer of left ankle, stage 3: Secondary | ICD-10-CM | POA: Diagnosis not present

## 2022-01-24 DIAGNOSIS — L89154 Pressure ulcer of sacral region, stage 4: Secondary | ICD-10-CM | POA: Diagnosis not present

## 2022-01-24 DIAGNOSIS — N319 Neuromuscular dysfunction of bladder, unspecified: Secondary | ICD-10-CM | POA: Diagnosis not present

## 2022-01-24 DIAGNOSIS — K592 Neurogenic bowel, not elsewhere classified: Secondary | ICD-10-CM | POA: Diagnosis not present

## 2022-01-24 DIAGNOSIS — Z9181 History of falling: Secondary | ICD-10-CM | POA: Diagnosis not present

## 2022-01-24 DIAGNOSIS — E7800 Pure hypercholesterolemia, unspecified: Secondary | ICD-10-CM | POA: Diagnosis not present

## 2022-01-24 DIAGNOSIS — G5601 Carpal tunnel syndrome, right upper limb: Secondary | ICD-10-CM | POA: Diagnosis not present

## 2022-01-24 DIAGNOSIS — K222 Esophageal obstruction: Secondary | ICD-10-CM | POA: Diagnosis not present

## 2022-01-24 DIAGNOSIS — Z7902 Long term (current) use of antithrombotics/antiplatelets: Secondary | ICD-10-CM | POA: Diagnosis not present

## 2022-01-27 DIAGNOSIS — R972 Elevated prostate specific antigen [PSA]: Secondary | ICD-10-CM | POA: Diagnosis not present

## 2022-01-27 DIAGNOSIS — L89154 Pressure ulcer of sacral region, stage 4: Secondary | ICD-10-CM | POA: Diagnosis not present

## 2022-01-28 DIAGNOSIS — L89159 Pressure ulcer of sacral region, unspecified stage: Secondary | ICD-10-CM | POA: Diagnosis not present

## 2022-01-28 DIAGNOSIS — L89214 Pressure ulcer of right hip, stage 4: Secondary | ICD-10-CM | POA: Diagnosis not present

## 2022-01-28 DIAGNOSIS — I10 Essential (primary) hypertension: Secondary | ICD-10-CM | POA: Diagnosis not present

## 2022-01-28 DIAGNOSIS — Z79899 Other long term (current) drug therapy: Secondary | ICD-10-CM | POA: Diagnosis not present

## 2022-01-28 DIAGNOSIS — L97519 Non-pressure chronic ulcer of other part of right foot with unspecified severity: Secondary | ICD-10-CM | POA: Diagnosis not present

## 2022-01-28 DIAGNOSIS — Z7902 Long term (current) use of antithrombotics/antiplatelets: Secondary | ICD-10-CM | POA: Diagnosis not present

## 2022-01-28 DIAGNOSIS — M549 Dorsalgia, unspecified: Secondary | ICD-10-CM | POA: Diagnosis not present

## 2022-01-28 DIAGNOSIS — L89523 Pressure ulcer of left ankle, stage 3: Secondary | ICD-10-CM | POA: Diagnosis not present

## 2022-01-28 DIAGNOSIS — L97529 Non-pressure chronic ulcer of other part of left foot with unspecified severity: Secondary | ICD-10-CM | POA: Diagnosis not present

## 2022-01-28 DIAGNOSIS — L89154 Pressure ulcer of sacral region, stage 4: Secondary | ICD-10-CM | POA: Diagnosis not present

## 2022-01-28 DIAGNOSIS — E7800 Pure hypercholesterolemia, unspecified: Secondary | ICD-10-CM | POA: Diagnosis not present

## 2022-01-28 DIAGNOSIS — G8929 Other chronic pain: Secondary | ICD-10-CM | POA: Diagnosis not present

## 2022-01-29 ENCOUNTER — Other Ambulatory Visit: Payer: Self-pay | Admitting: Nurse Practitioner

## 2022-01-29 DIAGNOSIS — I10 Essential (primary) hypertension: Secondary | ICD-10-CM | POA: Diagnosis not present

## 2022-01-29 DIAGNOSIS — K2101 Gastro-esophageal reflux disease with esophagitis, with bleeding: Secondary | ICD-10-CM

## 2022-01-29 DIAGNOSIS — E785 Hyperlipidemia, unspecified: Secondary | ICD-10-CM

## 2022-01-29 DIAGNOSIS — L89523 Pressure ulcer of left ankle, stage 3: Secondary | ICD-10-CM | POA: Diagnosis not present

## 2022-01-29 DIAGNOSIS — K592 Neurogenic bowel, not elsewhere classified: Secondary | ICD-10-CM | POA: Diagnosis not present

## 2022-01-29 DIAGNOSIS — M654 Radial styloid tenosynovitis [de Quervain]: Secondary | ICD-10-CM | POA: Diagnosis not present

## 2022-01-29 DIAGNOSIS — Z993 Dependence on wheelchair: Secondary | ICD-10-CM | POA: Diagnosis not present

## 2022-01-29 DIAGNOSIS — M549 Dorsalgia, unspecified: Secondary | ICD-10-CM | POA: Diagnosis not present

## 2022-01-29 DIAGNOSIS — Z7902 Long term (current) use of antithrombotics/antiplatelets: Secondary | ICD-10-CM | POA: Diagnosis not present

## 2022-01-29 DIAGNOSIS — E7800 Pure hypercholesterolemia, unspecified: Secondary | ICD-10-CM | POA: Diagnosis not present

## 2022-01-29 DIAGNOSIS — G8929 Other chronic pain: Secondary | ICD-10-CM | POA: Diagnosis not present

## 2022-01-29 DIAGNOSIS — R339 Retention of urine, unspecified: Secondary | ICD-10-CM | POA: Diagnosis not present

## 2022-01-29 DIAGNOSIS — K222 Esophageal obstruction: Secondary | ICD-10-CM | POA: Diagnosis not present

## 2022-01-29 DIAGNOSIS — N319 Neuromuscular dysfunction of bladder, unspecified: Secondary | ICD-10-CM | POA: Diagnosis not present

## 2022-01-29 DIAGNOSIS — Z9181 History of falling: Secondary | ICD-10-CM | POA: Diagnosis not present

## 2022-01-29 DIAGNOSIS — I739 Peripheral vascular disease, unspecified: Secondary | ICD-10-CM | POA: Diagnosis not present

## 2022-01-29 DIAGNOSIS — G5601 Carpal tunnel syndrome, right upper limb: Secondary | ICD-10-CM | POA: Diagnosis not present

## 2022-01-29 DIAGNOSIS — L89154 Pressure ulcer of sacral region, stage 4: Secondary | ICD-10-CM | POA: Diagnosis not present

## 2022-01-29 DIAGNOSIS — L89213 Pressure ulcer of right hip, stage 3: Secondary | ICD-10-CM | POA: Diagnosis not present

## 2022-01-29 DIAGNOSIS — K219 Gastro-esophageal reflux disease without esophagitis: Secondary | ICD-10-CM | POA: Diagnosis not present

## 2022-01-31 ENCOUNTER — Ambulatory Visit: Payer: Medicare HMO | Admitting: Physical Medicine and Rehabilitation

## 2022-01-31 ENCOUNTER — Other Ambulatory Visit: Payer: Self-pay | Admitting: Physical Medicine and Rehabilitation

## 2022-01-31 DIAGNOSIS — G8929 Other chronic pain: Secondary | ICD-10-CM | POA: Diagnosis not present

## 2022-01-31 DIAGNOSIS — K222 Esophageal obstruction: Secondary | ICD-10-CM | POA: Diagnosis not present

## 2022-01-31 DIAGNOSIS — R339 Retention of urine, unspecified: Secondary | ICD-10-CM | POA: Diagnosis not present

## 2022-01-31 DIAGNOSIS — Z9181 History of falling: Secondary | ICD-10-CM | POA: Diagnosis not present

## 2022-01-31 DIAGNOSIS — L89154 Pressure ulcer of sacral region, stage 4: Secondary | ICD-10-CM | POA: Diagnosis not present

## 2022-01-31 DIAGNOSIS — M549 Dorsalgia, unspecified: Secondary | ICD-10-CM | POA: Diagnosis not present

## 2022-01-31 DIAGNOSIS — Z993 Dependence on wheelchair: Secondary | ICD-10-CM | POA: Diagnosis not present

## 2022-01-31 DIAGNOSIS — N319 Neuromuscular dysfunction of bladder, unspecified: Secondary | ICD-10-CM | POA: Diagnosis not present

## 2022-01-31 DIAGNOSIS — M654 Radial styloid tenosynovitis [de Quervain]: Secondary | ICD-10-CM | POA: Diagnosis not present

## 2022-01-31 DIAGNOSIS — Z7902 Long term (current) use of antithrombotics/antiplatelets: Secondary | ICD-10-CM | POA: Diagnosis not present

## 2022-01-31 DIAGNOSIS — K592 Neurogenic bowel, not elsewhere classified: Secondary | ICD-10-CM | POA: Diagnosis not present

## 2022-01-31 DIAGNOSIS — L89213 Pressure ulcer of right hip, stage 3: Secondary | ICD-10-CM | POA: Diagnosis not present

## 2022-01-31 DIAGNOSIS — I739 Peripheral vascular disease, unspecified: Secondary | ICD-10-CM | POA: Diagnosis not present

## 2022-01-31 DIAGNOSIS — K219 Gastro-esophageal reflux disease without esophagitis: Secondary | ICD-10-CM | POA: Diagnosis not present

## 2022-01-31 DIAGNOSIS — E7800 Pure hypercholesterolemia, unspecified: Secondary | ICD-10-CM | POA: Diagnosis not present

## 2022-01-31 DIAGNOSIS — G5601 Carpal tunnel syndrome, right upper limb: Secondary | ICD-10-CM | POA: Diagnosis not present

## 2022-01-31 DIAGNOSIS — L89523 Pressure ulcer of left ankle, stage 3: Secondary | ICD-10-CM | POA: Diagnosis not present

## 2022-01-31 DIAGNOSIS — I10 Essential (primary) hypertension: Secondary | ICD-10-CM | POA: Diagnosis not present

## 2022-01-31 NOTE — Telephone Encounter (Signed)
Tramadol refill request. Next appointment

## 2022-01-31 NOTE — Telephone Encounter (Signed)
Dr. Dagoberto Ligas is out of the office:   Tramadol refill request. Last appointment 11/11/2021. Next appointment 03/24/2022.

## 2022-01-31 NOTE — Telephone Encounter (Signed)
[facility] was called.  They have his Tramadol prescription that was written in September by Dr Dagoberto Ligas.  Call placed to Mr. Rickey he is aware of the above and will pick up his Tramadol this evening,

## 2022-02-03 DIAGNOSIS — N401 Enlarged prostate with lower urinary tract symptoms: Secondary | ICD-10-CM | POA: Diagnosis not present

## 2022-02-03 DIAGNOSIS — R972 Elevated prostate specific antigen [PSA]: Secondary | ICD-10-CM | POA: Diagnosis not present

## 2022-02-04 DIAGNOSIS — Z9181 History of falling: Secondary | ICD-10-CM | POA: Diagnosis not present

## 2022-02-04 DIAGNOSIS — L89213 Pressure ulcer of right hip, stage 3: Secondary | ICD-10-CM | POA: Diagnosis not present

## 2022-02-04 DIAGNOSIS — I739 Peripheral vascular disease, unspecified: Secondary | ICD-10-CM | POA: Diagnosis not present

## 2022-02-04 DIAGNOSIS — G5601 Carpal tunnel syndrome, right upper limb: Secondary | ICD-10-CM | POA: Diagnosis not present

## 2022-02-04 DIAGNOSIS — N319 Neuromuscular dysfunction of bladder, unspecified: Secondary | ICD-10-CM | POA: Diagnosis not present

## 2022-02-04 DIAGNOSIS — K219 Gastro-esophageal reflux disease without esophagitis: Secondary | ICD-10-CM | POA: Diagnosis not present

## 2022-02-04 DIAGNOSIS — L89154 Pressure ulcer of sacral region, stage 4: Secondary | ICD-10-CM | POA: Diagnosis not present

## 2022-02-04 DIAGNOSIS — K222 Esophageal obstruction: Secondary | ICD-10-CM | POA: Diagnosis not present

## 2022-02-04 DIAGNOSIS — I10 Essential (primary) hypertension: Secondary | ICD-10-CM | POA: Diagnosis not present

## 2022-02-04 DIAGNOSIS — M654 Radial styloid tenosynovitis [de Quervain]: Secondary | ICD-10-CM | POA: Diagnosis not present

## 2022-02-04 DIAGNOSIS — Z993 Dependence on wheelchair: Secondary | ICD-10-CM | POA: Diagnosis not present

## 2022-02-04 DIAGNOSIS — E7800 Pure hypercholesterolemia, unspecified: Secondary | ICD-10-CM | POA: Diagnosis not present

## 2022-02-04 DIAGNOSIS — Z7902 Long term (current) use of antithrombotics/antiplatelets: Secondary | ICD-10-CM | POA: Diagnosis not present

## 2022-02-04 DIAGNOSIS — R339 Retention of urine, unspecified: Secondary | ICD-10-CM | POA: Diagnosis not present

## 2022-02-04 DIAGNOSIS — L89523 Pressure ulcer of left ankle, stage 3: Secondary | ICD-10-CM | POA: Diagnosis not present

## 2022-02-04 DIAGNOSIS — M549 Dorsalgia, unspecified: Secondary | ICD-10-CM | POA: Diagnosis not present

## 2022-02-04 DIAGNOSIS — K592 Neurogenic bowel, not elsewhere classified: Secondary | ICD-10-CM | POA: Diagnosis not present

## 2022-02-04 DIAGNOSIS — G8929 Other chronic pain: Secondary | ICD-10-CM | POA: Diagnosis not present

## 2022-02-05 ENCOUNTER — Other Ambulatory Visit: Payer: Self-pay | Admitting: Nurse Practitioner

## 2022-02-07 DIAGNOSIS — I10 Essential (primary) hypertension: Secondary | ICD-10-CM | POA: Diagnosis not present

## 2022-02-07 DIAGNOSIS — Z9181 History of falling: Secondary | ICD-10-CM | POA: Diagnosis not present

## 2022-02-07 DIAGNOSIS — I739 Peripheral vascular disease, unspecified: Secondary | ICD-10-CM | POA: Diagnosis not present

## 2022-02-07 DIAGNOSIS — M549 Dorsalgia, unspecified: Secondary | ICD-10-CM | POA: Diagnosis not present

## 2022-02-07 DIAGNOSIS — K222 Esophageal obstruction: Secondary | ICD-10-CM | POA: Diagnosis not present

## 2022-02-07 DIAGNOSIS — Z993 Dependence on wheelchair: Secondary | ICD-10-CM | POA: Diagnosis not present

## 2022-02-07 DIAGNOSIS — N319 Neuromuscular dysfunction of bladder, unspecified: Secondary | ICD-10-CM | POA: Diagnosis not present

## 2022-02-07 DIAGNOSIS — Z7902 Long term (current) use of antithrombotics/antiplatelets: Secondary | ICD-10-CM | POA: Diagnosis not present

## 2022-02-07 DIAGNOSIS — G8929 Other chronic pain: Secondary | ICD-10-CM | POA: Diagnosis not present

## 2022-02-07 DIAGNOSIS — G5601 Carpal tunnel syndrome, right upper limb: Secondary | ICD-10-CM | POA: Diagnosis not present

## 2022-02-07 DIAGNOSIS — L89154 Pressure ulcer of sacral region, stage 4: Secondary | ICD-10-CM | POA: Diagnosis not present

## 2022-02-07 DIAGNOSIS — K592 Neurogenic bowel, not elsewhere classified: Secondary | ICD-10-CM | POA: Diagnosis not present

## 2022-02-07 DIAGNOSIS — R339 Retention of urine, unspecified: Secondary | ICD-10-CM | POA: Diagnosis not present

## 2022-02-07 DIAGNOSIS — E7800 Pure hypercholesterolemia, unspecified: Secondary | ICD-10-CM | POA: Diagnosis not present

## 2022-02-07 DIAGNOSIS — K219 Gastro-esophageal reflux disease without esophagitis: Secondary | ICD-10-CM | POA: Diagnosis not present

## 2022-02-07 DIAGNOSIS — L89213 Pressure ulcer of right hip, stage 3: Secondary | ICD-10-CM | POA: Diagnosis not present

## 2022-02-07 DIAGNOSIS — M654 Radial styloid tenosynovitis [de Quervain]: Secondary | ICD-10-CM | POA: Diagnosis not present

## 2022-02-07 DIAGNOSIS — L89523 Pressure ulcer of left ankle, stage 3: Secondary | ICD-10-CM | POA: Diagnosis not present

## 2022-02-11 DIAGNOSIS — E7800 Pure hypercholesterolemia, unspecified: Secondary | ICD-10-CM | POA: Diagnosis not present

## 2022-02-11 DIAGNOSIS — L89214 Pressure ulcer of right hip, stage 4: Secondary | ICD-10-CM | POA: Diagnosis not present

## 2022-02-11 DIAGNOSIS — G8929 Other chronic pain: Secondary | ICD-10-CM | POA: Diagnosis not present

## 2022-02-11 DIAGNOSIS — K219 Gastro-esophageal reflux disease without esophagitis: Secondary | ICD-10-CM | POA: Diagnosis not present

## 2022-02-11 DIAGNOSIS — M549 Dorsalgia, unspecified: Secondary | ICD-10-CM | POA: Diagnosis not present

## 2022-02-11 DIAGNOSIS — L89154 Pressure ulcer of sacral region, stage 4: Secondary | ICD-10-CM | POA: Diagnosis not present

## 2022-02-11 DIAGNOSIS — Z7902 Long term (current) use of antithrombotics/antiplatelets: Secondary | ICD-10-CM | POA: Diagnosis not present

## 2022-02-11 DIAGNOSIS — L89159 Pressure ulcer of sacral region, unspecified stage: Secondary | ICD-10-CM | POA: Diagnosis not present

## 2022-02-11 DIAGNOSIS — L89523 Pressure ulcer of left ankle, stage 3: Secondary | ICD-10-CM | POA: Diagnosis not present

## 2022-02-11 DIAGNOSIS — L89510 Pressure ulcer of right ankle, unstageable: Secondary | ICD-10-CM | POA: Diagnosis not present

## 2022-02-11 DIAGNOSIS — I10 Essential (primary) hypertension: Secondary | ICD-10-CM | POA: Diagnosis not present

## 2022-02-11 DIAGNOSIS — Z79899 Other long term (current) drug therapy: Secondary | ICD-10-CM | POA: Diagnosis not present

## 2022-02-11 DIAGNOSIS — I739 Peripheral vascular disease, unspecified: Secondary | ICD-10-CM | POA: Diagnosis not present

## 2022-02-12 DIAGNOSIS — G5602 Carpal tunnel syndrome, left upper limb: Secondary | ICD-10-CM | POA: Diagnosis not present

## 2022-02-13 ENCOUNTER — Other Ambulatory Visit: Payer: Self-pay | Admitting: Nurse Practitioner

## 2022-02-13 DIAGNOSIS — G8220 Paraplegia, unspecified: Secondary | ICD-10-CM

## 2022-02-14 DIAGNOSIS — I739 Peripheral vascular disease, unspecified: Secondary | ICD-10-CM | POA: Diagnosis not present

## 2022-02-14 DIAGNOSIS — R339 Retention of urine, unspecified: Secondary | ICD-10-CM | POA: Diagnosis not present

## 2022-02-14 DIAGNOSIS — I10 Essential (primary) hypertension: Secondary | ICD-10-CM | POA: Diagnosis not present

## 2022-02-14 DIAGNOSIS — G8929 Other chronic pain: Secondary | ICD-10-CM | POA: Diagnosis not present

## 2022-02-14 DIAGNOSIS — K219 Gastro-esophageal reflux disease without esophagitis: Secondary | ICD-10-CM | POA: Diagnosis not present

## 2022-02-14 DIAGNOSIS — N319 Neuromuscular dysfunction of bladder, unspecified: Secondary | ICD-10-CM | POA: Diagnosis not present

## 2022-02-14 DIAGNOSIS — K222 Esophageal obstruction: Secondary | ICD-10-CM | POA: Diagnosis not present

## 2022-02-14 DIAGNOSIS — L89154 Pressure ulcer of sacral region, stage 4: Secondary | ICD-10-CM | POA: Diagnosis not present

## 2022-02-14 DIAGNOSIS — G5601 Carpal tunnel syndrome, right upper limb: Secondary | ICD-10-CM | POA: Diagnosis not present

## 2022-02-14 DIAGNOSIS — E7800 Pure hypercholesterolemia, unspecified: Secondary | ICD-10-CM | POA: Diagnosis not present

## 2022-02-14 DIAGNOSIS — M654 Radial styloid tenosynovitis [de Quervain]: Secondary | ICD-10-CM | POA: Diagnosis not present

## 2022-02-14 DIAGNOSIS — Z993 Dependence on wheelchair: Secondary | ICD-10-CM | POA: Diagnosis not present

## 2022-02-14 DIAGNOSIS — M549 Dorsalgia, unspecified: Secondary | ICD-10-CM | POA: Diagnosis not present

## 2022-02-14 DIAGNOSIS — L89213 Pressure ulcer of right hip, stage 3: Secondary | ICD-10-CM | POA: Diagnosis not present

## 2022-02-14 DIAGNOSIS — K592 Neurogenic bowel, not elsewhere classified: Secondary | ICD-10-CM | POA: Diagnosis not present

## 2022-02-14 DIAGNOSIS — Z9181 History of falling: Secondary | ICD-10-CM | POA: Diagnosis not present

## 2022-02-14 DIAGNOSIS — L89523 Pressure ulcer of left ankle, stage 3: Secondary | ICD-10-CM | POA: Diagnosis not present

## 2022-02-14 DIAGNOSIS — Z7902 Long term (current) use of antithrombotics/antiplatelets: Secondary | ICD-10-CM | POA: Diagnosis not present

## 2022-02-18 DIAGNOSIS — K219 Gastro-esophageal reflux disease without esophagitis: Secondary | ICD-10-CM | POA: Diagnosis not present

## 2022-02-18 DIAGNOSIS — E7800 Pure hypercholesterolemia, unspecified: Secondary | ICD-10-CM | POA: Diagnosis not present

## 2022-02-18 DIAGNOSIS — Z993 Dependence on wheelchair: Secondary | ICD-10-CM | POA: Diagnosis not present

## 2022-02-18 DIAGNOSIS — Z7902 Long term (current) use of antithrombotics/antiplatelets: Secondary | ICD-10-CM | POA: Diagnosis not present

## 2022-02-18 DIAGNOSIS — N319 Neuromuscular dysfunction of bladder, unspecified: Secondary | ICD-10-CM | POA: Diagnosis not present

## 2022-02-18 DIAGNOSIS — R339 Retention of urine, unspecified: Secondary | ICD-10-CM | POA: Diagnosis not present

## 2022-02-18 DIAGNOSIS — L89213 Pressure ulcer of right hip, stage 3: Secondary | ICD-10-CM | POA: Diagnosis not present

## 2022-02-18 DIAGNOSIS — M549 Dorsalgia, unspecified: Secondary | ICD-10-CM | POA: Diagnosis not present

## 2022-02-18 DIAGNOSIS — I10 Essential (primary) hypertension: Secondary | ICD-10-CM | POA: Diagnosis not present

## 2022-02-18 DIAGNOSIS — L89154 Pressure ulcer of sacral region, stage 4: Secondary | ICD-10-CM | POA: Diagnosis not present

## 2022-02-18 DIAGNOSIS — M654 Radial styloid tenosynovitis [de Quervain]: Secondary | ICD-10-CM | POA: Diagnosis not present

## 2022-02-18 DIAGNOSIS — Z9181 History of falling: Secondary | ICD-10-CM | POA: Diagnosis not present

## 2022-02-18 DIAGNOSIS — L89523 Pressure ulcer of left ankle, stage 3: Secondary | ICD-10-CM | POA: Diagnosis not present

## 2022-02-18 DIAGNOSIS — G8929 Other chronic pain: Secondary | ICD-10-CM | POA: Diagnosis not present

## 2022-02-18 DIAGNOSIS — K592 Neurogenic bowel, not elsewhere classified: Secondary | ICD-10-CM | POA: Diagnosis not present

## 2022-02-18 DIAGNOSIS — I739 Peripheral vascular disease, unspecified: Secondary | ICD-10-CM | POA: Diagnosis not present

## 2022-02-18 DIAGNOSIS — K222 Esophageal obstruction: Secondary | ICD-10-CM | POA: Diagnosis not present

## 2022-02-18 DIAGNOSIS — G5601 Carpal tunnel syndrome, right upper limb: Secondary | ICD-10-CM | POA: Diagnosis not present

## 2022-02-19 DIAGNOSIS — G5602 Carpal tunnel syndrome, left upper limb: Secondary | ICD-10-CM | POA: Diagnosis not present

## 2022-02-24 DIAGNOSIS — R339 Retention of urine, unspecified: Secondary | ICD-10-CM | POA: Diagnosis not present

## 2022-02-24 DIAGNOSIS — M549 Dorsalgia, unspecified: Secondary | ICD-10-CM | POA: Diagnosis not present

## 2022-02-24 DIAGNOSIS — L89154 Pressure ulcer of sacral region, stage 4: Secondary | ICD-10-CM | POA: Diagnosis not present

## 2022-02-24 DIAGNOSIS — L89523 Pressure ulcer of left ankle, stage 3: Secondary | ICD-10-CM | POA: Diagnosis not present

## 2022-02-24 DIAGNOSIS — E7800 Pure hypercholesterolemia, unspecified: Secondary | ICD-10-CM | POA: Diagnosis not present

## 2022-02-24 DIAGNOSIS — Z9181 History of falling: Secondary | ICD-10-CM | POA: Diagnosis not present

## 2022-02-24 DIAGNOSIS — G5601 Carpal tunnel syndrome, right upper limb: Secondary | ICD-10-CM | POA: Diagnosis not present

## 2022-02-24 DIAGNOSIS — M654 Radial styloid tenosynovitis [de Quervain]: Secondary | ICD-10-CM | POA: Diagnosis not present

## 2022-02-24 DIAGNOSIS — K219 Gastro-esophageal reflux disease without esophagitis: Secondary | ICD-10-CM | POA: Diagnosis not present

## 2022-02-24 DIAGNOSIS — Z993 Dependence on wheelchair: Secondary | ICD-10-CM | POA: Diagnosis not present

## 2022-02-24 DIAGNOSIS — I739 Peripheral vascular disease, unspecified: Secondary | ICD-10-CM | POA: Diagnosis not present

## 2022-02-24 DIAGNOSIS — G8929 Other chronic pain: Secondary | ICD-10-CM | POA: Diagnosis not present

## 2022-02-24 DIAGNOSIS — Z7902 Long term (current) use of antithrombotics/antiplatelets: Secondary | ICD-10-CM | POA: Diagnosis not present

## 2022-02-24 DIAGNOSIS — I10 Essential (primary) hypertension: Secondary | ICD-10-CM | POA: Diagnosis not present

## 2022-02-24 DIAGNOSIS — L89213 Pressure ulcer of right hip, stage 3: Secondary | ICD-10-CM | POA: Diagnosis not present

## 2022-02-24 DIAGNOSIS — K222 Esophageal obstruction: Secondary | ICD-10-CM | POA: Diagnosis not present

## 2022-02-24 DIAGNOSIS — N319 Neuromuscular dysfunction of bladder, unspecified: Secondary | ICD-10-CM | POA: Diagnosis not present

## 2022-02-24 DIAGNOSIS — K592 Neurogenic bowel, not elsewhere classified: Secondary | ICD-10-CM | POA: Diagnosis not present

## 2022-02-27 ENCOUNTER — Other Ambulatory Visit: Payer: Self-pay | Admitting: Urology

## 2022-02-27 DIAGNOSIS — Z9181 History of falling: Secondary | ICD-10-CM | POA: Diagnosis not present

## 2022-02-27 DIAGNOSIS — N319 Neuromuscular dysfunction of bladder, unspecified: Secondary | ICD-10-CM | POA: Diagnosis not present

## 2022-02-27 DIAGNOSIS — G5601 Carpal tunnel syndrome, right upper limb: Secondary | ICD-10-CM | POA: Diagnosis not present

## 2022-02-27 DIAGNOSIS — M549 Dorsalgia, unspecified: Secondary | ICD-10-CM | POA: Diagnosis not present

## 2022-02-27 DIAGNOSIS — G8929 Other chronic pain: Secondary | ICD-10-CM | POA: Diagnosis not present

## 2022-02-27 DIAGNOSIS — K222 Esophageal obstruction: Secondary | ICD-10-CM | POA: Diagnosis not present

## 2022-02-27 DIAGNOSIS — Z7902 Long term (current) use of antithrombotics/antiplatelets: Secondary | ICD-10-CM | POA: Diagnosis not present

## 2022-02-27 DIAGNOSIS — K219 Gastro-esophageal reflux disease without esophagitis: Secondary | ICD-10-CM | POA: Diagnosis not present

## 2022-02-27 DIAGNOSIS — I10 Essential (primary) hypertension: Secondary | ICD-10-CM | POA: Diagnosis not present

## 2022-02-27 DIAGNOSIS — L89213 Pressure ulcer of right hip, stage 3: Secondary | ICD-10-CM | POA: Diagnosis not present

## 2022-02-27 DIAGNOSIS — Z993 Dependence on wheelchair: Secondary | ICD-10-CM | POA: Diagnosis not present

## 2022-02-27 DIAGNOSIS — E7800 Pure hypercholesterolemia, unspecified: Secondary | ICD-10-CM | POA: Diagnosis not present

## 2022-02-27 DIAGNOSIS — R972 Elevated prostate specific antigen [PSA]: Secondary | ICD-10-CM

## 2022-02-27 DIAGNOSIS — L89154 Pressure ulcer of sacral region, stage 4: Secondary | ICD-10-CM | POA: Diagnosis not present

## 2022-02-27 DIAGNOSIS — I739 Peripheral vascular disease, unspecified: Secondary | ICD-10-CM | POA: Diagnosis not present

## 2022-02-27 DIAGNOSIS — M654 Radial styloid tenosynovitis [de Quervain]: Secondary | ICD-10-CM | POA: Diagnosis not present

## 2022-02-27 DIAGNOSIS — R339 Retention of urine, unspecified: Secondary | ICD-10-CM | POA: Diagnosis not present

## 2022-02-27 DIAGNOSIS — K592 Neurogenic bowel, not elsewhere classified: Secondary | ICD-10-CM | POA: Diagnosis not present

## 2022-02-27 DIAGNOSIS — L89523 Pressure ulcer of left ankle, stage 3: Secondary | ICD-10-CM | POA: Diagnosis not present

## 2022-03-04 DIAGNOSIS — I10 Essential (primary) hypertension: Secondary | ICD-10-CM | POA: Diagnosis not present

## 2022-03-04 DIAGNOSIS — I739 Peripheral vascular disease, unspecified: Secondary | ICD-10-CM | POA: Diagnosis not present

## 2022-03-04 DIAGNOSIS — L89892 Pressure ulcer of other site, stage 2: Secondary | ICD-10-CM | POA: Diagnosis not present

## 2022-03-04 DIAGNOSIS — L89153 Pressure ulcer of sacral region, stage 3: Secondary | ICD-10-CM | POA: Diagnosis not present

## 2022-03-04 DIAGNOSIS — G8929 Other chronic pain: Secondary | ICD-10-CM | POA: Diagnosis not present

## 2022-03-04 DIAGNOSIS — K219 Gastro-esophageal reflux disease without esophagitis: Secondary | ICD-10-CM | POA: Diagnosis not present

## 2022-03-04 DIAGNOSIS — L89523 Pressure ulcer of left ankle, stage 3: Secondary | ICD-10-CM | POA: Diagnosis not present

## 2022-03-04 DIAGNOSIS — M549 Dorsalgia, unspecified: Secondary | ICD-10-CM | POA: Diagnosis not present

## 2022-03-04 DIAGNOSIS — Z7902 Long term (current) use of antithrombotics/antiplatelets: Secondary | ICD-10-CM | POA: Diagnosis not present

## 2022-03-04 DIAGNOSIS — L89214 Pressure ulcer of right hip, stage 4: Secondary | ICD-10-CM | POA: Diagnosis not present

## 2022-03-04 DIAGNOSIS — L89510 Pressure ulcer of right ankle, unstageable: Secondary | ICD-10-CM | POA: Diagnosis not present

## 2022-03-04 DIAGNOSIS — L89159 Pressure ulcer of sacral region, unspecified stage: Secondary | ICD-10-CM | POA: Diagnosis not present

## 2022-03-04 DIAGNOSIS — Z79899 Other long term (current) drug therapy: Secondary | ICD-10-CM | POA: Diagnosis not present

## 2022-03-04 DIAGNOSIS — E7800 Pure hypercholesterolemia, unspecified: Secondary | ICD-10-CM | POA: Diagnosis not present

## 2022-03-06 DIAGNOSIS — K219 Gastro-esophageal reflux disease without esophagitis: Secondary | ICD-10-CM | POA: Diagnosis not present

## 2022-03-06 DIAGNOSIS — I739 Peripheral vascular disease, unspecified: Secondary | ICD-10-CM | POA: Diagnosis not present

## 2022-03-06 DIAGNOSIS — G8929 Other chronic pain: Secondary | ICD-10-CM | POA: Diagnosis not present

## 2022-03-06 DIAGNOSIS — K222 Esophageal obstruction: Secondary | ICD-10-CM | POA: Diagnosis not present

## 2022-03-06 DIAGNOSIS — M654 Radial styloid tenosynovitis [de Quervain]: Secondary | ICD-10-CM | POA: Diagnosis not present

## 2022-03-06 DIAGNOSIS — Z9181 History of falling: Secondary | ICD-10-CM | POA: Diagnosis not present

## 2022-03-06 DIAGNOSIS — K592 Neurogenic bowel, not elsewhere classified: Secondary | ICD-10-CM | POA: Diagnosis not present

## 2022-03-06 DIAGNOSIS — G5602 Carpal tunnel syndrome, left upper limb: Secondary | ICD-10-CM | POA: Diagnosis not present

## 2022-03-06 DIAGNOSIS — E7800 Pure hypercholesterolemia, unspecified: Secondary | ICD-10-CM | POA: Diagnosis not present

## 2022-03-06 DIAGNOSIS — R339 Retention of urine, unspecified: Secondary | ICD-10-CM | POA: Diagnosis not present

## 2022-03-06 DIAGNOSIS — G5601 Carpal tunnel syndrome, right upper limb: Secondary | ICD-10-CM | POA: Diagnosis not present

## 2022-03-06 DIAGNOSIS — Z7902 Long term (current) use of antithrombotics/antiplatelets: Secondary | ICD-10-CM | POA: Diagnosis not present

## 2022-03-06 DIAGNOSIS — M549 Dorsalgia, unspecified: Secondary | ICD-10-CM | POA: Diagnosis not present

## 2022-03-06 DIAGNOSIS — L89154 Pressure ulcer of sacral region, stage 4: Secondary | ICD-10-CM | POA: Diagnosis not present

## 2022-03-06 DIAGNOSIS — N319 Neuromuscular dysfunction of bladder, unspecified: Secondary | ICD-10-CM | POA: Diagnosis not present

## 2022-03-06 DIAGNOSIS — L89213 Pressure ulcer of right hip, stage 3: Secondary | ICD-10-CM | POA: Diagnosis not present

## 2022-03-06 DIAGNOSIS — Z993 Dependence on wheelchair: Secondary | ICD-10-CM | POA: Diagnosis not present

## 2022-03-06 DIAGNOSIS — I10 Essential (primary) hypertension: Secondary | ICD-10-CM | POA: Diagnosis not present

## 2022-03-06 DIAGNOSIS — L89523 Pressure ulcer of left ankle, stage 3: Secondary | ICD-10-CM | POA: Diagnosis not present

## 2022-03-07 DIAGNOSIS — I10 Essential (primary) hypertension: Secondary | ICD-10-CM | POA: Diagnosis not present

## 2022-03-07 DIAGNOSIS — N319 Neuromuscular dysfunction of bladder, unspecified: Secondary | ICD-10-CM | POA: Diagnosis not present

## 2022-03-07 DIAGNOSIS — L89523 Pressure ulcer of left ankle, stage 3: Secondary | ICD-10-CM | POA: Diagnosis not present

## 2022-03-07 DIAGNOSIS — Z993 Dependence on wheelchair: Secondary | ICD-10-CM | POA: Diagnosis not present

## 2022-03-07 DIAGNOSIS — L89213 Pressure ulcer of right hip, stage 3: Secondary | ICD-10-CM | POA: Diagnosis not present

## 2022-03-07 DIAGNOSIS — M654 Radial styloid tenosynovitis [de Quervain]: Secondary | ICD-10-CM | POA: Diagnosis not present

## 2022-03-07 DIAGNOSIS — I739 Peripheral vascular disease, unspecified: Secondary | ICD-10-CM | POA: Diagnosis not present

## 2022-03-07 DIAGNOSIS — E7800 Pure hypercholesterolemia, unspecified: Secondary | ICD-10-CM | POA: Diagnosis not present

## 2022-03-07 DIAGNOSIS — K222 Esophageal obstruction: Secondary | ICD-10-CM | POA: Diagnosis not present

## 2022-03-07 DIAGNOSIS — L89154 Pressure ulcer of sacral region, stage 4: Secondary | ICD-10-CM | POA: Diagnosis not present

## 2022-03-07 DIAGNOSIS — R339 Retention of urine, unspecified: Secondary | ICD-10-CM | POA: Diagnosis not present

## 2022-03-07 DIAGNOSIS — K592 Neurogenic bowel, not elsewhere classified: Secondary | ICD-10-CM | POA: Diagnosis not present

## 2022-03-07 DIAGNOSIS — G8929 Other chronic pain: Secondary | ICD-10-CM | POA: Diagnosis not present

## 2022-03-07 DIAGNOSIS — G5601 Carpal tunnel syndrome, right upper limb: Secondary | ICD-10-CM | POA: Diagnosis not present

## 2022-03-07 DIAGNOSIS — Z7902 Long term (current) use of antithrombotics/antiplatelets: Secondary | ICD-10-CM | POA: Diagnosis not present

## 2022-03-07 DIAGNOSIS — M549 Dorsalgia, unspecified: Secondary | ICD-10-CM | POA: Diagnosis not present

## 2022-03-07 DIAGNOSIS — K219 Gastro-esophageal reflux disease without esophagitis: Secondary | ICD-10-CM | POA: Diagnosis not present

## 2022-03-07 DIAGNOSIS — Z9181 History of falling: Secondary | ICD-10-CM | POA: Diagnosis not present

## 2022-03-09 DIAGNOSIS — S24103A Unspecified injury at T7-T10 level of thoracic spinal cord, initial encounter: Secondary | ICD-10-CM | POA: Diagnosis not present

## 2022-03-09 DIAGNOSIS — L89153 Pressure ulcer of sacral region, stage 3: Secondary | ICD-10-CM | POA: Diagnosis not present

## 2022-03-09 DIAGNOSIS — G8220 Paraplegia, unspecified: Secondary | ICD-10-CM | POA: Diagnosis not present

## 2022-03-11 DIAGNOSIS — I739 Peripheral vascular disease, unspecified: Secondary | ICD-10-CM | POA: Diagnosis not present

## 2022-03-11 DIAGNOSIS — G5601 Carpal tunnel syndrome, right upper limb: Secondary | ICD-10-CM | POA: Diagnosis not present

## 2022-03-11 DIAGNOSIS — K219 Gastro-esophageal reflux disease without esophagitis: Secondary | ICD-10-CM | POA: Diagnosis not present

## 2022-03-11 DIAGNOSIS — G8929 Other chronic pain: Secondary | ICD-10-CM | POA: Diagnosis not present

## 2022-03-11 DIAGNOSIS — L89213 Pressure ulcer of right hip, stage 3: Secondary | ICD-10-CM | POA: Diagnosis not present

## 2022-03-11 DIAGNOSIS — Z9181 History of falling: Secondary | ICD-10-CM | POA: Diagnosis not present

## 2022-03-11 DIAGNOSIS — Z7902 Long term (current) use of antithrombotics/antiplatelets: Secondary | ICD-10-CM | POA: Diagnosis not present

## 2022-03-11 DIAGNOSIS — R339 Retention of urine, unspecified: Secondary | ICD-10-CM | POA: Diagnosis not present

## 2022-03-11 DIAGNOSIS — L89154 Pressure ulcer of sacral region, stage 4: Secondary | ICD-10-CM | POA: Diagnosis not present

## 2022-03-11 DIAGNOSIS — I10 Essential (primary) hypertension: Secondary | ICD-10-CM | POA: Diagnosis not present

## 2022-03-11 DIAGNOSIS — Z993 Dependence on wheelchair: Secondary | ICD-10-CM | POA: Diagnosis not present

## 2022-03-11 DIAGNOSIS — L89523 Pressure ulcer of left ankle, stage 3: Secondary | ICD-10-CM | POA: Diagnosis not present

## 2022-03-11 DIAGNOSIS — K592 Neurogenic bowel, not elsewhere classified: Secondary | ICD-10-CM | POA: Diagnosis not present

## 2022-03-11 DIAGNOSIS — M654 Radial styloid tenosynovitis [de Quervain]: Secondary | ICD-10-CM | POA: Diagnosis not present

## 2022-03-11 DIAGNOSIS — K222 Esophageal obstruction: Secondary | ICD-10-CM | POA: Diagnosis not present

## 2022-03-11 DIAGNOSIS — M549 Dorsalgia, unspecified: Secondary | ICD-10-CM | POA: Diagnosis not present

## 2022-03-11 DIAGNOSIS — E7800 Pure hypercholesterolemia, unspecified: Secondary | ICD-10-CM | POA: Diagnosis not present

## 2022-03-11 DIAGNOSIS — N319 Neuromuscular dysfunction of bladder, unspecified: Secondary | ICD-10-CM | POA: Diagnosis not present

## 2022-03-13 DIAGNOSIS — G5602 Carpal tunnel syndrome, left upper limb: Secondary | ICD-10-CM | POA: Diagnosis not present

## 2022-03-14 DIAGNOSIS — L89213 Pressure ulcer of right hip, stage 3: Secondary | ICD-10-CM | POA: Diagnosis not present

## 2022-03-14 DIAGNOSIS — G8929 Other chronic pain: Secondary | ICD-10-CM | POA: Diagnosis not present

## 2022-03-14 DIAGNOSIS — L89523 Pressure ulcer of left ankle, stage 3: Secondary | ICD-10-CM | POA: Diagnosis not present

## 2022-03-14 DIAGNOSIS — K219 Gastro-esophageal reflux disease without esophagitis: Secondary | ICD-10-CM | POA: Diagnosis not present

## 2022-03-14 DIAGNOSIS — K222 Esophageal obstruction: Secondary | ICD-10-CM | POA: Diagnosis not present

## 2022-03-14 DIAGNOSIS — Z993 Dependence on wheelchair: Secondary | ICD-10-CM | POA: Diagnosis not present

## 2022-03-14 DIAGNOSIS — L89154 Pressure ulcer of sacral region, stage 4: Secondary | ICD-10-CM | POA: Diagnosis not present

## 2022-03-14 DIAGNOSIS — Z7902 Long term (current) use of antithrombotics/antiplatelets: Secondary | ICD-10-CM | POA: Diagnosis not present

## 2022-03-14 DIAGNOSIS — E7800 Pure hypercholesterolemia, unspecified: Secondary | ICD-10-CM | POA: Diagnosis not present

## 2022-03-14 DIAGNOSIS — Z9181 History of falling: Secondary | ICD-10-CM | POA: Diagnosis not present

## 2022-03-14 DIAGNOSIS — G5601 Carpal tunnel syndrome, right upper limb: Secondary | ICD-10-CM | POA: Diagnosis not present

## 2022-03-14 DIAGNOSIS — R339 Retention of urine, unspecified: Secondary | ICD-10-CM | POA: Diagnosis not present

## 2022-03-14 DIAGNOSIS — K592 Neurogenic bowel, not elsewhere classified: Secondary | ICD-10-CM | POA: Diagnosis not present

## 2022-03-14 DIAGNOSIS — M549 Dorsalgia, unspecified: Secondary | ICD-10-CM | POA: Diagnosis not present

## 2022-03-14 DIAGNOSIS — M654 Radial styloid tenosynovitis [de Quervain]: Secondary | ICD-10-CM | POA: Diagnosis not present

## 2022-03-14 DIAGNOSIS — N319 Neuromuscular dysfunction of bladder, unspecified: Secondary | ICD-10-CM | POA: Diagnosis not present

## 2022-03-14 DIAGNOSIS — I10 Essential (primary) hypertension: Secondary | ICD-10-CM | POA: Diagnosis not present

## 2022-03-14 DIAGNOSIS — I739 Peripheral vascular disease, unspecified: Secondary | ICD-10-CM | POA: Diagnosis not present

## 2022-03-17 ENCOUNTER — Other Ambulatory Visit: Payer: Self-pay | Admitting: Nurse Practitioner

## 2022-03-17 DIAGNOSIS — E785 Hyperlipidemia, unspecified: Secondary | ICD-10-CM

## 2022-03-18 DIAGNOSIS — Z993 Dependence on wheelchair: Secondary | ICD-10-CM | POA: Diagnosis not present

## 2022-03-18 DIAGNOSIS — L89214 Pressure ulcer of right hip, stage 4: Secondary | ICD-10-CM | POA: Diagnosis not present

## 2022-03-18 DIAGNOSIS — I10 Essential (primary) hypertension: Secondary | ICD-10-CM | POA: Diagnosis not present

## 2022-03-18 DIAGNOSIS — L89153 Pressure ulcer of sacral region, stage 3: Secondary | ICD-10-CM | POA: Diagnosis not present

## 2022-03-18 DIAGNOSIS — K592 Neurogenic bowel, not elsewhere classified: Secondary | ICD-10-CM | POA: Diagnosis not present

## 2022-03-18 DIAGNOSIS — L89151 Pressure ulcer of sacral region, stage 1: Secondary | ICD-10-CM | POA: Diagnosis not present

## 2022-03-18 DIAGNOSIS — E7800 Pure hypercholesterolemia, unspecified: Secondary | ICD-10-CM | POA: Diagnosis not present

## 2022-03-18 DIAGNOSIS — L97519 Non-pressure chronic ulcer of other part of right foot with unspecified severity: Secondary | ICD-10-CM | POA: Diagnosis not present

## 2022-03-18 DIAGNOSIS — G8220 Paraplegia, unspecified: Secondary | ICD-10-CM | POA: Diagnosis not present

## 2022-03-18 DIAGNOSIS — L89523 Pressure ulcer of left ankle, stage 3: Secondary | ICD-10-CM | POA: Diagnosis not present

## 2022-03-18 DIAGNOSIS — Z79899 Other long term (current) drug therapy: Secondary | ICD-10-CM | POA: Diagnosis not present

## 2022-03-18 DIAGNOSIS — N319 Neuromuscular dysfunction of bladder, unspecified: Secondary | ICD-10-CM | POA: Diagnosis not present

## 2022-03-18 DIAGNOSIS — Z7902 Long term (current) use of antithrombotics/antiplatelets: Secondary | ICD-10-CM | POA: Diagnosis not present

## 2022-03-18 DIAGNOSIS — L89154 Pressure ulcer of sacral region, stage 4: Secondary | ICD-10-CM | POA: Diagnosis not present

## 2022-03-18 DIAGNOSIS — L89892 Pressure ulcer of other site, stage 2: Secondary | ICD-10-CM | POA: Diagnosis not present

## 2022-03-18 DIAGNOSIS — I739 Peripheral vascular disease, unspecified: Secondary | ICD-10-CM | POA: Diagnosis not present

## 2022-03-18 DIAGNOSIS — L97329 Non-pressure chronic ulcer of left ankle with unspecified severity: Secondary | ICD-10-CM | POA: Diagnosis not present

## 2022-03-19 DIAGNOSIS — K592 Neurogenic bowel, not elsewhere classified: Secondary | ICD-10-CM | POA: Diagnosis not present

## 2022-03-19 DIAGNOSIS — G5601 Carpal tunnel syndrome, right upper limb: Secondary | ICD-10-CM | POA: Diagnosis not present

## 2022-03-19 DIAGNOSIS — L89154 Pressure ulcer of sacral region, stage 4: Secondary | ICD-10-CM | POA: Diagnosis not present

## 2022-03-19 DIAGNOSIS — E7800 Pure hypercholesterolemia, unspecified: Secondary | ICD-10-CM | POA: Diagnosis not present

## 2022-03-19 DIAGNOSIS — L89523 Pressure ulcer of left ankle, stage 3: Secondary | ICD-10-CM | POA: Diagnosis not present

## 2022-03-19 DIAGNOSIS — M549 Dorsalgia, unspecified: Secondary | ICD-10-CM | POA: Diagnosis not present

## 2022-03-19 DIAGNOSIS — L89213 Pressure ulcer of right hip, stage 3: Secondary | ICD-10-CM | POA: Diagnosis not present

## 2022-03-19 DIAGNOSIS — G8929 Other chronic pain: Secondary | ICD-10-CM | POA: Diagnosis not present

## 2022-03-19 DIAGNOSIS — Z7902 Long term (current) use of antithrombotics/antiplatelets: Secondary | ICD-10-CM | POA: Diagnosis not present

## 2022-03-19 DIAGNOSIS — I739 Peripheral vascular disease, unspecified: Secondary | ICD-10-CM | POA: Diagnosis not present

## 2022-03-19 DIAGNOSIS — N319 Neuromuscular dysfunction of bladder, unspecified: Secondary | ICD-10-CM | POA: Diagnosis not present

## 2022-03-19 DIAGNOSIS — K222 Esophageal obstruction: Secondary | ICD-10-CM | POA: Diagnosis not present

## 2022-03-19 DIAGNOSIS — M654 Radial styloid tenosynovitis [de Quervain]: Secondary | ICD-10-CM | POA: Diagnosis not present

## 2022-03-19 DIAGNOSIS — K219 Gastro-esophageal reflux disease without esophagitis: Secondary | ICD-10-CM | POA: Diagnosis not present

## 2022-03-19 DIAGNOSIS — R339 Retention of urine, unspecified: Secondary | ICD-10-CM | POA: Diagnosis not present

## 2022-03-19 DIAGNOSIS — I10 Essential (primary) hypertension: Secondary | ICD-10-CM | POA: Diagnosis not present

## 2022-03-19 DIAGNOSIS — Z993 Dependence on wheelchair: Secondary | ICD-10-CM | POA: Diagnosis not present

## 2022-03-19 DIAGNOSIS — Z9181 History of falling: Secondary | ICD-10-CM | POA: Diagnosis not present

## 2022-03-22 ENCOUNTER — Other Ambulatory Visit: Payer: Self-pay | Admitting: Nurse Practitioner

## 2022-03-22 DIAGNOSIS — Z9181 History of falling: Secondary | ICD-10-CM | POA: Diagnosis not present

## 2022-03-22 DIAGNOSIS — I10 Essential (primary) hypertension: Secondary | ICD-10-CM

## 2022-03-22 DIAGNOSIS — Z7902 Long term (current) use of antithrombotics/antiplatelets: Secondary | ICD-10-CM | POA: Diagnosis not present

## 2022-03-22 DIAGNOSIS — E7800 Pure hypercholesterolemia, unspecified: Secondary | ICD-10-CM | POA: Diagnosis not present

## 2022-03-22 DIAGNOSIS — L89523 Pressure ulcer of left ankle, stage 3: Secondary | ICD-10-CM | POA: Diagnosis not present

## 2022-03-22 DIAGNOSIS — G8929 Other chronic pain: Secondary | ICD-10-CM | POA: Diagnosis not present

## 2022-03-22 DIAGNOSIS — K592 Neurogenic bowel, not elsewhere classified: Secondary | ICD-10-CM | POA: Diagnosis not present

## 2022-03-22 DIAGNOSIS — L89154 Pressure ulcer of sacral region, stage 4: Secondary | ICD-10-CM | POA: Diagnosis not present

## 2022-03-22 DIAGNOSIS — K222 Esophageal obstruction: Secondary | ICD-10-CM | POA: Diagnosis not present

## 2022-03-22 DIAGNOSIS — M654 Radial styloid tenosynovitis [de Quervain]: Secondary | ICD-10-CM | POA: Diagnosis not present

## 2022-03-22 DIAGNOSIS — I739 Peripheral vascular disease, unspecified: Secondary | ICD-10-CM | POA: Diagnosis not present

## 2022-03-22 DIAGNOSIS — M549 Dorsalgia, unspecified: Secondary | ICD-10-CM | POA: Diagnosis not present

## 2022-03-22 DIAGNOSIS — Z993 Dependence on wheelchair: Secondary | ICD-10-CM | POA: Diagnosis not present

## 2022-03-22 DIAGNOSIS — R339 Retention of urine, unspecified: Secondary | ICD-10-CM | POA: Diagnosis not present

## 2022-03-22 DIAGNOSIS — K219 Gastro-esophageal reflux disease without esophagitis: Secondary | ICD-10-CM | POA: Diagnosis not present

## 2022-03-22 DIAGNOSIS — N319 Neuromuscular dysfunction of bladder, unspecified: Secondary | ICD-10-CM | POA: Diagnosis not present

## 2022-03-22 DIAGNOSIS — L89213 Pressure ulcer of right hip, stage 3: Secondary | ICD-10-CM | POA: Diagnosis not present

## 2022-03-22 DIAGNOSIS — G5601 Carpal tunnel syndrome, right upper limb: Secondary | ICD-10-CM | POA: Diagnosis not present

## 2022-03-24 ENCOUNTER — Encounter: Payer: Medicare HMO | Attending: Physical Medicine and Rehabilitation | Admitting: Physical Medicine and Rehabilitation

## 2022-03-24 ENCOUNTER — Encounter: Payer: Self-pay | Admitting: Physical Medicine and Rehabilitation

## 2022-03-24 ENCOUNTER — Other Ambulatory Visit: Payer: Self-pay | Admitting: Nurse Practitioner

## 2022-03-24 VITALS — BP 144/71 | HR 83 | Ht 71.0 in | Wt 155.0 lb

## 2022-03-24 DIAGNOSIS — R252 Cramp and spasm: Secondary | ICD-10-CM | POA: Diagnosis not present

## 2022-03-24 DIAGNOSIS — S24104S Unspecified injury at T11-T12 level of thoracic spinal cord, sequela: Secondary | ICD-10-CM | POA: Diagnosis not present

## 2022-03-24 DIAGNOSIS — Z9889 Other specified postprocedural states: Secondary | ICD-10-CM | POA: Diagnosis not present

## 2022-03-24 DIAGNOSIS — G5602 Carpal tunnel syndrome, left upper limb: Secondary | ICD-10-CM | POA: Diagnosis not present

## 2022-03-24 DIAGNOSIS — G8220 Paraplegia, unspecified: Secondary | ICD-10-CM

## 2022-03-24 MED ORDER — BACLOFEN 10 MG PO TABS: 5.0000 mg | ORAL_TABLET | Freq: Three times a day (TID) | ORAL | 5 refills | Status: DC | PRN

## 2022-03-24 MED ORDER — TRAMADOL HCL 50 MG PO TABS: 50.0000 mg | ORAL_TABLET | Freq: Three times a day (TID) | ORAL | 1 refills | Status: DC | PRN
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
## 2022-03-25 DIAGNOSIS — Z7902 Long term (current) use of antithrombotics/antiplatelets: Secondary | ICD-10-CM | POA: Diagnosis not present

## 2022-03-25 DIAGNOSIS — K592 Neurogenic bowel, not elsewhere classified: Secondary | ICD-10-CM | POA: Diagnosis not present

## 2022-03-25 DIAGNOSIS — R339 Retention of urine, unspecified: Secondary | ICD-10-CM | POA: Diagnosis not present

## 2022-03-25 DIAGNOSIS — K219 Gastro-esophageal reflux disease without esophagitis: Secondary | ICD-10-CM | POA: Diagnosis not present

## 2022-03-25 DIAGNOSIS — L89213 Pressure ulcer of right hip, stage 3: Secondary | ICD-10-CM | POA: Diagnosis not present

## 2022-03-25 DIAGNOSIS — L89154 Pressure ulcer of sacral region, stage 4: Secondary | ICD-10-CM | POA: Diagnosis not present

## 2022-03-25 DIAGNOSIS — I739 Peripheral vascular disease, unspecified: Secondary | ICD-10-CM | POA: Diagnosis not present

## 2022-03-25 DIAGNOSIS — I10 Essential (primary) hypertension: Secondary | ICD-10-CM | POA: Diagnosis not present

## 2022-03-25 DIAGNOSIS — Z993 Dependence on wheelchair: Secondary | ICD-10-CM | POA: Diagnosis not present

## 2022-03-25 DIAGNOSIS — E7800 Pure hypercholesterolemia, unspecified: Secondary | ICD-10-CM | POA: Diagnosis not present

## 2022-03-25 DIAGNOSIS — K222 Esophageal obstruction: Secondary | ICD-10-CM | POA: Diagnosis not present

## 2022-03-25 DIAGNOSIS — L89523 Pressure ulcer of left ankle, stage 3: Secondary | ICD-10-CM | POA: Diagnosis not present

## 2022-03-25 DIAGNOSIS — Z9181 History of falling: Secondary | ICD-10-CM | POA: Diagnosis not present

## 2022-03-25 DIAGNOSIS — M549 Dorsalgia, unspecified: Secondary | ICD-10-CM | POA: Diagnosis not present

## 2022-03-25 DIAGNOSIS — N319 Neuromuscular dysfunction of bladder, unspecified: Secondary | ICD-10-CM | POA: Diagnosis not present

## 2022-03-25 DIAGNOSIS — M654 Radial styloid tenosynovitis [de Quervain]: Secondary | ICD-10-CM | POA: Diagnosis not present

## 2022-03-25 DIAGNOSIS — G8929 Other chronic pain: Secondary | ICD-10-CM | POA: Diagnosis not present

## 2022-03-25 DIAGNOSIS — G5601 Carpal tunnel syndrome, right upper limb: Secondary | ICD-10-CM | POA: Diagnosis not present

## 2022-03-26 ENCOUNTER — Encounter: Payer: Self-pay | Admitting: Internal Medicine

## 2022-03-28 DIAGNOSIS — M654 Radial styloid tenosynovitis [de Quervain]: Secondary | ICD-10-CM | POA: Diagnosis not present

## 2022-03-28 DIAGNOSIS — L89213 Pressure ulcer of right hip, stage 3: Secondary | ICD-10-CM | POA: Diagnosis not present

## 2022-03-28 DIAGNOSIS — E7800 Pure hypercholesterolemia, unspecified: Secondary | ICD-10-CM | POA: Diagnosis not present

## 2022-03-28 DIAGNOSIS — I10 Essential (primary) hypertension: Secondary | ICD-10-CM | POA: Diagnosis not present

## 2022-03-28 DIAGNOSIS — G5601 Carpal tunnel syndrome, right upper limb: Secondary | ICD-10-CM | POA: Diagnosis not present

## 2022-03-28 DIAGNOSIS — K222 Esophageal obstruction: Secondary | ICD-10-CM | POA: Diagnosis not present

## 2022-03-28 DIAGNOSIS — G8929 Other chronic pain: Secondary | ICD-10-CM | POA: Diagnosis not present

## 2022-03-28 DIAGNOSIS — M549 Dorsalgia, unspecified: Secondary | ICD-10-CM | POA: Diagnosis not present

## 2022-03-28 DIAGNOSIS — Z7902 Long term (current) use of antithrombotics/antiplatelets: Secondary | ICD-10-CM | POA: Diagnosis not present

## 2022-03-28 DIAGNOSIS — L89154 Pressure ulcer of sacral region, stage 4: Secondary | ICD-10-CM | POA: Diagnosis not present

## 2022-03-28 DIAGNOSIS — I739 Peripheral vascular disease, unspecified: Secondary | ICD-10-CM | POA: Diagnosis not present

## 2022-03-28 DIAGNOSIS — Z9181 History of falling: Secondary | ICD-10-CM | POA: Diagnosis not present

## 2022-03-28 DIAGNOSIS — N319 Neuromuscular dysfunction of bladder, unspecified: Secondary | ICD-10-CM | POA: Diagnosis not present

## 2022-03-28 DIAGNOSIS — R339 Retention of urine, unspecified: Secondary | ICD-10-CM | POA: Diagnosis not present

## 2022-03-28 DIAGNOSIS — Z993 Dependence on wheelchair: Secondary | ICD-10-CM | POA: Diagnosis not present

## 2022-03-28 DIAGNOSIS — L89523 Pressure ulcer of left ankle, stage 3: Secondary | ICD-10-CM | POA: Diagnosis not present

## 2022-03-28 DIAGNOSIS — K592 Neurogenic bowel, not elsewhere classified: Secondary | ICD-10-CM | POA: Diagnosis not present

## 2022-03-28 DIAGNOSIS — K219 Gastro-esophageal reflux disease without esophagitis: Secondary | ICD-10-CM | POA: Diagnosis not present

## 2022-03-30 ENCOUNTER — Other Ambulatory Visit: Payer: Self-pay | Admitting: Nurse Practitioner

## 2022-03-30 DIAGNOSIS — I10 Essential (primary) hypertension: Secondary | ICD-10-CM

## 2022-04-01 ENCOUNTER — Ambulatory Visit
Admission: RE | Admit: 2022-04-01 | Discharge: 2022-04-01 | Disposition: A | Discharge status: Home / Self Care | Payer: Medicare HMO | Source: Ambulatory Visit | Attending: Urology | Admitting: Urology

## 2022-04-01 DIAGNOSIS — Z993 Dependence on wheelchair: Secondary | ICD-10-CM | POA: Diagnosis not present

## 2022-04-01 DIAGNOSIS — M549 Dorsalgia, unspecified: Secondary | ICD-10-CM | POA: Diagnosis not present

## 2022-04-01 DIAGNOSIS — M654 Radial styloid tenosynovitis [de Quervain]: Secondary | ICD-10-CM | POA: Diagnosis not present

## 2022-04-01 DIAGNOSIS — R972 Elevated prostate specific antigen [PSA]: Secondary | ICD-10-CM | POA: Diagnosis not present

## 2022-04-01 DIAGNOSIS — G8929 Other chronic pain: Secondary | ICD-10-CM | POA: Diagnosis not present

## 2022-04-01 DIAGNOSIS — K219 Gastro-esophageal reflux disease without esophagitis: Secondary | ICD-10-CM | POA: Diagnosis not present

## 2022-04-01 DIAGNOSIS — I10 Essential (primary) hypertension: Secondary | ICD-10-CM | POA: Diagnosis not present

## 2022-04-01 DIAGNOSIS — L89213 Pressure ulcer of right hip, stage 3: Secondary | ICD-10-CM | POA: Diagnosis not present

## 2022-04-01 DIAGNOSIS — Z9181 History of falling: Secondary | ICD-10-CM | POA: Diagnosis not present

## 2022-04-01 DIAGNOSIS — E7800 Pure hypercholesterolemia, unspecified: Secondary | ICD-10-CM | POA: Diagnosis not present

## 2022-04-01 DIAGNOSIS — L89523 Pressure ulcer of left ankle, stage 3: Secondary | ICD-10-CM | POA: Diagnosis not present

## 2022-04-01 DIAGNOSIS — R339 Retention of urine, unspecified: Secondary | ICD-10-CM | POA: Diagnosis not present

## 2022-04-01 DIAGNOSIS — I739 Peripheral vascular disease, unspecified: Secondary | ICD-10-CM | POA: Diagnosis not present

## 2022-04-01 DIAGNOSIS — Z7902 Long term (current) use of antithrombotics/antiplatelets: Secondary | ICD-10-CM | POA: Diagnosis not present

## 2022-04-01 DIAGNOSIS — K222 Esophageal obstruction: Secondary | ICD-10-CM | POA: Diagnosis not present

## 2022-04-01 DIAGNOSIS — G5601 Carpal tunnel syndrome, right upper limb: Secondary | ICD-10-CM | POA: Diagnosis not present

## 2022-04-01 DIAGNOSIS — K592 Neurogenic bowel, not elsewhere classified: Secondary | ICD-10-CM | POA: Diagnosis not present

## 2022-04-01 DIAGNOSIS — L89154 Pressure ulcer of sacral region, stage 4: Secondary | ICD-10-CM | POA: Diagnosis not present

## 2022-04-01 DIAGNOSIS — N319 Neuromuscular dysfunction of bladder, unspecified: Secondary | ICD-10-CM | POA: Diagnosis not present

## 2022-04-01 MED ORDER — GADOPICLENOL 0.5 MMOL/ML IV SOLN
9.0000 mL | Freq: Once | INTRAVENOUS | Status: AC | PRN
  Administered 2022-04-01: 9 mL via INTRAVENOUS

## 2022-04-03 ENCOUNTER — Encounter: Payer: Self-pay | Admitting: *Deleted

## 2022-04-04 DIAGNOSIS — K219 Gastro-esophageal reflux disease without esophagitis: Secondary | ICD-10-CM | POA: Diagnosis not present

## 2022-04-04 DIAGNOSIS — M549 Dorsalgia, unspecified: Secondary | ICD-10-CM | POA: Diagnosis not present

## 2022-04-04 DIAGNOSIS — Z993 Dependence on wheelchair: Secondary | ICD-10-CM | POA: Diagnosis not present

## 2022-04-04 DIAGNOSIS — M654 Radial styloid tenosynovitis [de Quervain]: Secondary | ICD-10-CM | POA: Diagnosis not present

## 2022-04-04 DIAGNOSIS — N319 Neuromuscular dysfunction of bladder, unspecified: Secondary | ICD-10-CM | POA: Diagnosis not present

## 2022-04-04 DIAGNOSIS — K592 Neurogenic bowel, not elsewhere classified: Secondary | ICD-10-CM | POA: Diagnosis not present

## 2022-04-04 DIAGNOSIS — I739 Peripheral vascular disease, unspecified: Secondary | ICD-10-CM | POA: Diagnosis not present

## 2022-04-04 DIAGNOSIS — G8929 Other chronic pain: Secondary | ICD-10-CM | POA: Diagnosis not present

## 2022-04-04 DIAGNOSIS — Z7902 Long term (current) use of antithrombotics/antiplatelets: Secondary | ICD-10-CM | POA: Diagnosis not present

## 2022-04-04 DIAGNOSIS — Z9181 History of falling: Secondary | ICD-10-CM | POA: Diagnosis not present

## 2022-04-04 DIAGNOSIS — G5601 Carpal tunnel syndrome, right upper limb: Secondary | ICD-10-CM | POA: Diagnosis not present

## 2022-04-04 DIAGNOSIS — L89213 Pressure ulcer of right hip, stage 3: Secondary | ICD-10-CM | POA: Diagnosis not present

## 2022-04-04 DIAGNOSIS — L89154 Pressure ulcer of sacral region, stage 4: Secondary | ICD-10-CM | POA: Diagnosis not present

## 2022-04-04 DIAGNOSIS — R339 Retention of urine, unspecified: Secondary | ICD-10-CM | POA: Diagnosis not present

## 2022-04-04 DIAGNOSIS — E7800 Pure hypercholesterolemia, unspecified: Secondary | ICD-10-CM | POA: Diagnosis not present

## 2022-04-04 DIAGNOSIS — K222 Esophageal obstruction: Secondary | ICD-10-CM | POA: Diagnosis not present

## 2022-04-04 DIAGNOSIS — L89523 Pressure ulcer of left ankle, stage 3: Secondary | ICD-10-CM | POA: Diagnosis not present

## 2022-04-04 DIAGNOSIS — I10 Essential (primary) hypertension: Secondary | ICD-10-CM | POA: Diagnosis not present

## 2022-04-08 DIAGNOSIS — Z993 Dependence on wheelchair: Secondary | ICD-10-CM | POA: Diagnosis not present

## 2022-04-08 DIAGNOSIS — Z7902 Long term (current) use of antithrombotics/antiplatelets: Secondary | ICD-10-CM | POA: Diagnosis not present

## 2022-04-08 DIAGNOSIS — Z9181 History of falling: Secondary | ICD-10-CM | POA: Diagnosis not present

## 2022-04-08 DIAGNOSIS — L89523 Pressure ulcer of left ankle, stage 3: Secondary | ICD-10-CM | POA: Diagnosis not present

## 2022-04-08 DIAGNOSIS — I10 Essential (primary) hypertension: Secondary | ICD-10-CM | POA: Diagnosis not present

## 2022-04-08 DIAGNOSIS — K222 Esophageal obstruction: Secondary | ICD-10-CM | POA: Diagnosis not present

## 2022-04-08 DIAGNOSIS — G5601 Carpal tunnel syndrome, right upper limb: Secondary | ICD-10-CM | POA: Diagnosis not present

## 2022-04-08 DIAGNOSIS — L89154 Pressure ulcer of sacral region, stage 4: Secondary | ICD-10-CM | POA: Diagnosis not present

## 2022-04-08 DIAGNOSIS — L89213 Pressure ulcer of right hip, stage 3: Secondary | ICD-10-CM | POA: Diagnosis not present

## 2022-04-08 DIAGNOSIS — M549 Dorsalgia, unspecified: Secondary | ICD-10-CM | POA: Diagnosis not present

## 2022-04-08 DIAGNOSIS — N319 Neuromuscular dysfunction of bladder, unspecified: Secondary | ICD-10-CM | POA: Diagnosis not present

## 2022-04-08 DIAGNOSIS — R339 Retention of urine, unspecified: Secondary | ICD-10-CM | POA: Diagnosis not present

## 2022-04-08 DIAGNOSIS — M654 Radial styloid tenosynovitis [de Quervain]: Secondary | ICD-10-CM | POA: Diagnosis not present

## 2022-04-08 DIAGNOSIS — G8929 Other chronic pain: Secondary | ICD-10-CM | POA: Diagnosis not present

## 2022-04-08 DIAGNOSIS — I739 Peripheral vascular disease, unspecified: Secondary | ICD-10-CM | POA: Diagnosis not present

## 2022-04-08 DIAGNOSIS — K219 Gastro-esophageal reflux disease without esophagitis: Secondary | ICD-10-CM | POA: Diagnosis not present

## 2022-04-08 DIAGNOSIS — K592 Neurogenic bowel, not elsewhere classified: Secondary | ICD-10-CM | POA: Diagnosis not present

## 2022-04-08 DIAGNOSIS — E7800 Pure hypercholesterolemia, unspecified: Secondary | ICD-10-CM | POA: Diagnosis not present

## 2022-04-09 DIAGNOSIS — R339 Retention of urine, unspecified: Secondary | ICD-10-CM | POA: Diagnosis not present

## 2022-04-15 ENCOUNTER — Other Ambulatory Visit: Payer: Self-pay | Admitting: Nurse Practitioner

## 2022-04-15 DIAGNOSIS — L89523 Pressure ulcer of left ankle, stage 3: Secondary | ICD-10-CM | POA: Diagnosis not present

## 2022-04-15 DIAGNOSIS — K219 Gastro-esophageal reflux disease without esophagitis: Secondary | ICD-10-CM | POA: Diagnosis not present

## 2022-04-15 DIAGNOSIS — L89214 Pressure ulcer of right hip, stage 4: Secondary | ICD-10-CM | POA: Diagnosis not present

## 2022-04-15 DIAGNOSIS — Z79899 Other long term (current) drug therapy: Secondary | ICD-10-CM | POA: Diagnosis not present

## 2022-04-15 DIAGNOSIS — L89154 Pressure ulcer of sacral region, stage 4: Secondary | ICD-10-CM | POA: Diagnosis not present

## 2022-04-15 DIAGNOSIS — I10 Essential (primary) hypertension: Secondary | ICD-10-CM

## 2022-04-15 DIAGNOSIS — E785 Hyperlipidemia, unspecified: Secondary | ICD-10-CM

## 2022-04-15 DIAGNOSIS — K2101 Gastro-esophageal reflux disease with esophagitis, with bleeding: Secondary | ICD-10-CM

## 2022-04-15 DIAGNOSIS — E7800 Pure hypercholesterolemia, unspecified: Secondary | ICD-10-CM | POA: Diagnosis not present

## 2022-04-17 ENCOUNTER — Other Ambulatory Visit: Payer: Self-pay | Admitting: Nurse Practitioner

## 2022-04-17 DIAGNOSIS — E785 Hyperlipidemia, unspecified: Secondary | ICD-10-CM

## 2022-04-19 ENCOUNTER — Other Ambulatory Visit: Payer: Self-pay | Admitting: Nurse Practitioner

## 2022-04-19 DIAGNOSIS — Z993 Dependence on wheelchair: Secondary | ICD-10-CM | POA: Diagnosis not present

## 2022-04-19 DIAGNOSIS — Z9181 History of falling: Secondary | ICD-10-CM | POA: Diagnosis not present

## 2022-04-19 DIAGNOSIS — M654 Radial styloid tenosynovitis [de Quervain]: Secondary | ICD-10-CM | POA: Diagnosis not present

## 2022-04-19 DIAGNOSIS — L89213 Pressure ulcer of right hip, stage 3: Secondary | ICD-10-CM | POA: Diagnosis not present

## 2022-04-19 DIAGNOSIS — L89154 Pressure ulcer of sacral region, stage 4: Secondary | ICD-10-CM | POA: Diagnosis not present

## 2022-04-19 DIAGNOSIS — I739 Peripheral vascular disease, unspecified: Secondary | ICD-10-CM | POA: Diagnosis not present

## 2022-04-19 DIAGNOSIS — K592 Neurogenic bowel, not elsewhere classified: Secondary | ICD-10-CM | POA: Diagnosis not present

## 2022-04-19 DIAGNOSIS — K219 Gastro-esophageal reflux disease without esophagitis: Secondary | ICD-10-CM | POA: Diagnosis not present

## 2022-04-19 DIAGNOSIS — I10 Essential (primary) hypertension: Secondary | ICD-10-CM

## 2022-04-19 DIAGNOSIS — G5601 Carpal tunnel syndrome, right upper limb: Secondary | ICD-10-CM | POA: Diagnosis not present

## 2022-04-19 DIAGNOSIS — L89523 Pressure ulcer of left ankle, stage 3: Secondary | ICD-10-CM | POA: Diagnosis not present

## 2022-04-19 DIAGNOSIS — Z7902 Long term (current) use of antithrombotics/antiplatelets: Secondary | ICD-10-CM | POA: Diagnosis not present

## 2022-04-19 DIAGNOSIS — E7800 Pure hypercholesterolemia, unspecified: Secondary | ICD-10-CM | POA: Diagnosis not present

## 2022-04-19 DIAGNOSIS — M549 Dorsalgia, unspecified: Secondary | ICD-10-CM | POA: Diagnosis not present

## 2022-04-19 DIAGNOSIS — R339 Retention of urine, unspecified: Secondary | ICD-10-CM | POA: Diagnosis not present

## 2022-04-19 DIAGNOSIS — G8929 Other chronic pain: Secondary | ICD-10-CM | POA: Diagnosis not present

## 2022-04-19 DIAGNOSIS — K2101 Gastro-esophageal reflux disease with esophagitis, with bleeding: Secondary | ICD-10-CM

## 2022-04-19 DIAGNOSIS — N319 Neuromuscular dysfunction of bladder, unspecified: Secondary | ICD-10-CM | POA: Diagnosis not present

## 2022-04-19 DIAGNOSIS — K222 Esophageal obstruction: Secondary | ICD-10-CM | POA: Diagnosis not present

## 2022-04-23 DIAGNOSIS — R339 Retention of urine, unspecified: Secondary | ICD-10-CM | POA: Diagnosis not present

## 2022-04-23 DIAGNOSIS — N319 Neuromuscular dysfunction of bladder, unspecified: Secondary | ICD-10-CM | POA: Diagnosis not present

## 2022-04-23 DIAGNOSIS — M654 Radial styloid tenosynovitis [de Quervain]: Secondary | ICD-10-CM | POA: Diagnosis not present

## 2022-04-23 DIAGNOSIS — G5601 Carpal tunnel syndrome, right upper limb: Secondary | ICD-10-CM | POA: Diagnosis not present

## 2022-04-23 DIAGNOSIS — M549 Dorsalgia, unspecified: Secondary | ICD-10-CM | POA: Diagnosis not present

## 2022-04-23 DIAGNOSIS — Z9181 History of falling: Secondary | ICD-10-CM | POA: Diagnosis not present

## 2022-04-23 DIAGNOSIS — K219 Gastro-esophageal reflux disease without esophagitis: Secondary | ICD-10-CM | POA: Diagnosis not present

## 2022-04-23 DIAGNOSIS — I739 Peripheral vascular disease, unspecified: Secondary | ICD-10-CM | POA: Diagnosis not present

## 2022-04-23 DIAGNOSIS — Z993 Dependence on wheelchair: Secondary | ICD-10-CM | POA: Diagnosis not present

## 2022-04-23 DIAGNOSIS — K222 Esophageal obstruction: Secondary | ICD-10-CM | POA: Diagnosis not present

## 2022-04-23 DIAGNOSIS — L89523 Pressure ulcer of left ankle, stage 3: Secondary | ICD-10-CM | POA: Diagnosis not present

## 2022-04-23 DIAGNOSIS — L89213 Pressure ulcer of right hip, stage 3: Secondary | ICD-10-CM | POA: Diagnosis not present

## 2022-04-23 DIAGNOSIS — Z7902 Long term (current) use of antithrombotics/antiplatelets: Secondary | ICD-10-CM | POA: Diagnosis not present

## 2022-04-23 DIAGNOSIS — L89154 Pressure ulcer of sacral region, stage 4: Secondary | ICD-10-CM | POA: Diagnosis not present

## 2022-04-23 DIAGNOSIS — K592 Neurogenic bowel, not elsewhere classified: Secondary | ICD-10-CM | POA: Diagnosis not present

## 2022-04-23 DIAGNOSIS — G8929 Other chronic pain: Secondary | ICD-10-CM | POA: Diagnosis not present

## 2022-04-23 DIAGNOSIS — I10 Essential (primary) hypertension: Secondary | ICD-10-CM | POA: Diagnosis not present

## 2022-04-23 DIAGNOSIS — E7800 Pure hypercholesterolemia, unspecified: Secondary | ICD-10-CM | POA: Diagnosis not present

## 2022-04-24 ENCOUNTER — Other Ambulatory Visit: Payer: Self-pay | Admitting: Nurse Practitioner

## 2022-04-24 DIAGNOSIS — Z993 Dependence on wheelchair: Secondary | ICD-10-CM | POA: Diagnosis not present

## 2022-04-24 DIAGNOSIS — L89154 Pressure ulcer of sacral region, stage 4: Secondary | ICD-10-CM | POA: Diagnosis not present

## 2022-04-24 DIAGNOSIS — L89523 Pressure ulcer of left ankle, stage 3: Secondary | ICD-10-CM | POA: Diagnosis not present

## 2022-04-24 DIAGNOSIS — I10 Essential (primary) hypertension: Secondary | ICD-10-CM | POA: Diagnosis not present

## 2022-04-24 DIAGNOSIS — N319 Neuromuscular dysfunction of bladder, unspecified: Secondary | ICD-10-CM | POA: Diagnosis not present

## 2022-04-24 DIAGNOSIS — L89213 Pressure ulcer of right hip, stage 3: Secondary | ICD-10-CM | POA: Diagnosis not present

## 2022-04-24 DIAGNOSIS — M549 Dorsalgia, unspecified: Secondary | ICD-10-CM | POA: Diagnosis not present

## 2022-04-24 DIAGNOSIS — K222 Esophageal obstruction: Secondary | ICD-10-CM | POA: Diagnosis not present

## 2022-04-24 DIAGNOSIS — K219 Gastro-esophageal reflux disease without esophagitis: Secondary | ICD-10-CM | POA: Diagnosis not present

## 2022-04-24 DIAGNOSIS — K592 Neurogenic bowel, not elsewhere classified: Secondary | ICD-10-CM | POA: Diagnosis not present

## 2022-04-24 DIAGNOSIS — E785 Hyperlipidemia, unspecified: Secondary | ICD-10-CM

## 2022-04-24 DIAGNOSIS — G5601 Carpal tunnel syndrome, right upper limb: Secondary | ICD-10-CM | POA: Diagnosis not present

## 2022-04-24 DIAGNOSIS — R339 Retention of urine, unspecified: Secondary | ICD-10-CM | POA: Diagnosis not present

## 2022-04-24 DIAGNOSIS — Z9181 History of falling: Secondary | ICD-10-CM | POA: Diagnosis not present

## 2022-04-24 DIAGNOSIS — M654 Radial styloid tenosynovitis [de Quervain]: Secondary | ICD-10-CM | POA: Diagnosis not present

## 2022-04-24 DIAGNOSIS — Z7902 Long term (current) use of antithrombotics/antiplatelets: Secondary | ICD-10-CM | POA: Diagnosis not present

## 2022-04-24 DIAGNOSIS — E7800 Pure hypercholesterolemia, unspecified: Secondary | ICD-10-CM | POA: Diagnosis not present

## 2022-04-24 DIAGNOSIS — G8929 Other chronic pain: Secondary | ICD-10-CM | POA: Diagnosis not present

## 2022-04-24 DIAGNOSIS — I739 Peripheral vascular disease, unspecified: Secondary | ICD-10-CM | POA: Diagnosis not present

## 2022-04-24 NOTE — Telephone Encounter (Signed)
Left message for pt to call back and schedule appt °

## 2022-04-24 NOTE — Telephone Encounter (Signed)
MMM NTBS 30 days given 03/29/22

## 2022-04-28 ENCOUNTER — Other Ambulatory Visit: Payer: Self-pay | Admitting: Nurse Practitioner

## 2022-04-28 DIAGNOSIS — I10 Essential (primary) hypertension: Secondary | ICD-10-CM

## 2022-04-29 DIAGNOSIS — K219 Gastro-esophageal reflux disease without esophagitis: Secondary | ICD-10-CM | POA: Diagnosis not present

## 2022-04-29 DIAGNOSIS — L89512 Pressure ulcer of right ankle, stage 2: Secondary | ICD-10-CM | POA: Diagnosis not present

## 2022-04-29 DIAGNOSIS — L89214 Pressure ulcer of right hip, stage 4: Secondary | ICD-10-CM | POA: Diagnosis not present

## 2022-04-29 DIAGNOSIS — Z79899 Other long term (current) drug therapy: Secondary | ICD-10-CM | POA: Diagnosis not present

## 2022-04-29 DIAGNOSIS — L89154 Pressure ulcer of sacral region, stage 4: Secondary | ICD-10-CM | POA: Diagnosis not present

## 2022-04-29 DIAGNOSIS — I10 Essential (primary) hypertension: Secondary | ICD-10-CM | POA: Diagnosis not present

## 2022-04-29 DIAGNOSIS — L89892 Pressure ulcer of other site, stage 2: Secondary | ICD-10-CM | POA: Diagnosis not present

## 2022-04-29 DIAGNOSIS — R339 Retention of urine, unspecified: Secondary | ICD-10-CM | POA: Diagnosis not present

## 2022-04-29 DIAGNOSIS — E7800 Pure hypercholesterolemia, unspecified: Secondary | ICD-10-CM | POA: Diagnosis not present

## 2022-04-29 DIAGNOSIS — L89523 Pressure ulcer of left ankle, stage 3: Secondary | ICD-10-CM | POA: Diagnosis not present

## 2022-05-03 ENCOUNTER — Other Ambulatory Visit: Payer: Self-pay | Admitting: Nurse Practitioner

## 2022-05-03 DIAGNOSIS — E785 Hyperlipidemia, unspecified: Secondary | ICD-10-CM

## 2022-05-05 DIAGNOSIS — R351 Nocturia: Secondary | ICD-10-CM | POA: Diagnosis not present

## 2022-05-05 DIAGNOSIS — E782 Mixed hyperlipidemia: Secondary | ICD-10-CM | POA: Diagnosis not present

## 2022-05-05 DIAGNOSIS — G40209 Localization-related (focal) (partial) symptomatic epilepsy and epileptic syndromes with complex partial seizures, not intractable, without status epilepticus: Secondary | ICD-10-CM | POA: Diagnosis not present

## 2022-05-05 DIAGNOSIS — Z79899 Other long term (current) drug therapy: Secondary | ICD-10-CM | POA: Diagnosis not present

## 2022-05-06 ENCOUNTER — Encounter: Payer: Self-pay | Admitting: Nurse Practitioner

## 2022-05-06 ENCOUNTER — Ambulatory Visit: Payer: Medicare HMO | Admitting: Nurse Practitioner

## 2022-05-08 ENCOUNTER — Ambulatory Visit: Payer: Medicare HMO | Admitting: Nurse Practitioner

## 2022-05-12 ENCOUNTER — Other Ambulatory Visit: Payer: Self-pay | Admitting: Nurse Practitioner

## 2022-05-12 DIAGNOSIS — E785 Hyperlipidemia, unspecified: Secondary | ICD-10-CM

## 2022-05-13 ENCOUNTER — Ambulatory Visit: Payer: Medicare HMO | Admitting: Nurse Practitioner

## 2022-05-13 DIAGNOSIS — I10 Essential (primary) hypertension: Secondary | ICD-10-CM | POA: Diagnosis not present

## 2022-05-13 DIAGNOSIS — I70243 Atherosclerosis of native arteries of left leg with ulceration of ankle: Secondary | ICD-10-CM | POA: Diagnosis not present

## 2022-05-13 DIAGNOSIS — Z792 Long term (current) use of antibiotics: Secondary | ICD-10-CM | POA: Diagnosis not present

## 2022-05-13 DIAGNOSIS — I70233 Atherosclerosis of native arteries of right leg with ulceration of ankle: Secondary | ICD-10-CM | POA: Diagnosis not present

## 2022-05-13 DIAGNOSIS — L97519 Non-pressure chronic ulcer of other part of right foot with unspecified severity: Secondary | ICD-10-CM | POA: Diagnosis not present

## 2022-05-13 DIAGNOSIS — L89159 Pressure ulcer of sacral region, unspecified stage: Secondary | ICD-10-CM | POA: Diagnosis not present

## 2022-05-13 DIAGNOSIS — L89154 Pressure ulcer of sacral region, stage 4: Secondary | ICD-10-CM | POA: Diagnosis not present

## 2022-05-13 DIAGNOSIS — L97529 Non-pressure chronic ulcer of other part of left foot with unspecified severity: Secondary | ICD-10-CM | POA: Diagnosis not present

## 2022-05-13 DIAGNOSIS — G8929 Other chronic pain: Secondary | ICD-10-CM | POA: Diagnosis not present

## 2022-05-13 DIAGNOSIS — L89214 Pressure ulcer of right hip, stage 4: Secondary | ICD-10-CM | POA: Diagnosis not present

## 2022-05-13 DIAGNOSIS — E7800 Pure hypercholesterolemia, unspecified: Secondary | ICD-10-CM | POA: Diagnosis not present

## 2022-05-13 DIAGNOSIS — L89523 Pressure ulcer of left ankle, stage 3: Secondary | ICD-10-CM | POA: Diagnosis not present

## 2022-05-13 DIAGNOSIS — Z79899 Other long term (current) drug therapy: Secondary | ICD-10-CM | POA: Diagnosis not present

## 2022-05-13 DIAGNOSIS — L89223 Pressure ulcer of left hip, stage 3: Secondary | ICD-10-CM | POA: Diagnosis not present

## 2022-05-13 DIAGNOSIS — L89524 Pressure ulcer of left ankle, stage 4: Secondary | ICD-10-CM | POA: Diagnosis not present

## 2022-05-13 DIAGNOSIS — Z7902 Long term (current) use of antithrombotics/antiplatelets: Secondary | ICD-10-CM | POA: Diagnosis not present

## 2022-05-13 DIAGNOSIS — L89892 Pressure ulcer of other site, stage 2: Secondary | ICD-10-CM | POA: Diagnosis not present

## 2022-05-16 ENCOUNTER — Other Ambulatory Visit: Payer: Self-pay | Admitting: Nurse Practitioner

## 2022-05-16 DIAGNOSIS — G8220 Paraplegia, unspecified: Secondary | ICD-10-CM

## 2022-05-24 ENCOUNTER — Other Ambulatory Visit: Payer: Self-pay | Admitting: Nurse Practitioner

## 2022-05-24 DIAGNOSIS — I10 Essential (primary) hypertension: Secondary | ICD-10-CM

## 2022-06-03 DIAGNOSIS — L89524 Pressure ulcer of left ankle, stage 4: Secondary | ICD-10-CM | POA: Diagnosis not present

## 2022-06-03 DIAGNOSIS — Z79899 Other long term (current) drug therapy: Secondary | ICD-10-CM | POA: Diagnosis not present

## 2022-06-03 DIAGNOSIS — L97519 Non-pressure chronic ulcer of other part of right foot with unspecified severity: Secondary | ICD-10-CM | POA: Diagnosis not present

## 2022-06-03 DIAGNOSIS — I70243 Atherosclerosis of native arteries of left leg with ulceration of ankle: Secondary | ICD-10-CM | POA: Diagnosis not present

## 2022-06-03 DIAGNOSIS — L89159 Pressure ulcer of sacral region, unspecified stage: Secondary | ICD-10-CM | POA: Diagnosis not present

## 2022-06-03 DIAGNOSIS — L89523 Pressure ulcer of left ankle, stage 3: Secondary | ICD-10-CM | POA: Diagnosis not present

## 2022-06-03 DIAGNOSIS — E7800 Pure hypercholesterolemia, unspecified: Secondary | ICD-10-CM | POA: Diagnosis not present

## 2022-06-03 DIAGNOSIS — L89214 Pressure ulcer of right hip, stage 4: Secondary | ICD-10-CM | POA: Diagnosis not present

## 2022-06-03 DIAGNOSIS — Z792 Long term (current) use of antibiotics: Secondary | ICD-10-CM | POA: Diagnosis not present

## 2022-06-03 DIAGNOSIS — L89154 Pressure ulcer of sacral region, stage 4: Secondary | ICD-10-CM | POA: Diagnosis not present

## 2022-06-03 DIAGNOSIS — Z7902 Long term (current) use of antithrombotics/antiplatelets: Secondary | ICD-10-CM | POA: Diagnosis not present

## 2022-06-03 DIAGNOSIS — I70233 Atherosclerosis of native arteries of right leg with ulceration of ankle: Secondary | ICD-10-CM | POA: Diagnosis not present

## 2022-06-03 DIAGNOSIS — I10 Essential (primary) hypertension: Secondary | ICD-10-CM | POA: Diagnosis not present

## 2022-06-03 DIAGNOSIS — L89223 Pressure ulcer of left hip, stage 3: Secondary | ICD-10-CM | POA: Diagnosis not present

## 2022-06-03 DIAGNOSIS — G8929 Other chronic pain: Secondary | ICD-10-CM | POA: Diagnosis not present

## 2022-06-03 DIAGNOSIS — L97529 Non-pressure chronic ulcer of other part of left foot with unspecified severity: Secondary | ICD-10-CM | POA: Diagnosis not present

## 2022-06-08 ENCOUNTER — Other Ambulatory Visit: Payer: Self-pay | Admitting: Nurse Practitioner

## 2022-06-08 DIAGNOSIS — I10 Essential (primary) hypertension: Secondary | ICD-10-CM

## 2022-06-23 ENCOUNTER — Encounter: Payer: Self-pay | Admitting: Physical Medicine and Rehabilitation

## 2022-06-23 ENCOUNTER — Encounter: Payer: Medicare HMO | Attending: Physical Medicine and Rehabilitation | Admitting: Physical Medicine and Rehabilitation

## 2022-06-23 VITALS — BP 151/77 | HR 89 | Ht 71.0 in | Wt 155.0 lb

## 2022-06-23 DIAGNOSIS — R509 Fever, unspecified: Secondary | ICD-10-CM | POA: Diagnosis not present

## 2022-06-23 DIAGNOSIS — S24104S Unspecified injury at T11-T12 level of thoracic spinal cord, sequela: Secondary | ICD-10-CM | POA: Diagnosis not present

## 2022-06-23 DIAGNOSIS — G894 Chronic pain syndrome: Secondary | ICD-10-CM | POA: Diagnosis not present

## 2022-06-23 DIAGNOSIS — R252 Cramp and spasm: Secondary | ICD-10-CM | POA: Diagnosis not present

## 2022-06-23 DIAGNOSIS — K529 Noninfective gastroenteritis and colitis, unspecified: Secondary | ICD-10-CM | POA: Diagnosis not present

## 2022-06-23 DIAGNOSIS — Z993 Dependence on wheelchair: Secondary | ICD-10-CM | POA: Diagnosis not present

## 2022-06-23 MED ORDER — TRAMADOL HCL 50 MG PO TABS: 50.0000 mg | ORAL_TABLET | Freq: Four times a day (QID) | ORAL | 5 refills | Status: DC | PRN

## 2022-06-23 MED ORDER — PROMETHAZINE HCL 25 MG PO TABS: 25.0000 mg | ORAL_TABLET | Freq: Three times a day (TID) | ORAL | 5 refills | Status: DC | PRN
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
## 2022-06-25 ENCOUNTER — Telehealth: Payer: Self-pay

## 2022-06-25 NOTE — Telephone Encounter (Signed)
PA submitted for Promethazine

## 2022-06-25 NOTE — Telephone Encounter (Signed)
Approved 02/17/22-02/17/23 

## 2022-06-27 ENCOUNTER — Encounter: Payer: Self-pay | Admitting: Nurse Practitioner

## 2022-06-27 ENCOUNTER — Ambulatory Visit (INDEPENDENT_AMBULATORY_CARE_PROVIDER_SITE_OTHER): Payer: Medicare HMO | Admitting: Nurse Practitioner

## 2022-06-27 VITALS — BP 148/88 | HR 83 | Temp 97.4°F | Ht 71.0 in | Wt 155.0 lb

## 2022-06-27 DIAGNOSIS — K219 Gastro-esophageal reflux disease without esophagitis: Secondary | ICD-10-CM | POA: Diagnosis not present

## 2022-06-27 DIAGNOSIS — K592 Neurogenic bowel, not elsewhere classified: Secondary | ICD-10-CM | POA: Diagnosis not present

## 2022-06-27 DIAGNOSIS — I739 Peripheral vascular disease, unspecified: Secondary | ICD-10-CM | POA: Diagnosis not present

## 2022-06-27 DIAGNOSIS — E785 Hyperlipidemia, unspecified: Secondary | ICD-10-CM

## 2022-06-27 DIAGNOSIS — K2101 Gastro-esophageal reflux disease with esophagitis, with bleeding: Secondary | ICD-10-CM | POA: Diagnosis not present

## 2022-06-27 DIAGNOSIS — I10 Essential (primary) hypertension: Secondary | ICD-10-CM | POA: Diagnosis not present

## 2022-06-27 DIAGNOSIS — G8220 Paraplegia, unspecified: Secondary | ICD-10-CM

## 2022-06-27 DIAGNOSIS — Z125 Encounter for screening for malignant neoplasm of prostate: Secondary | ICD-10-CM

## 2022-06-27 MED ORDER — PANTOPRAZOLE SODIUM 40 MG PO TBEC: 40.0000 mg | DELAYED_RELEASE_TABLET | Freq: Every day | ORAL | 1 refills | Status: DC

## 2022-06-27 MED ORDER — HYDROCHLOROTHIAZIDE 25 MG PO TABS: 25.0000 mg | ORAL_TABLET | Freq: Every day | ORAL | 1 refills | Status: DC

## 2022-06-27 MED ORDER — TESTOSTERONE CYPIONATE 200 MG/ML IM SOLN: 150.0000 mg | INTRAMUSCULAR | 5 refills | Status: DC

## 2022-06-27 MED ORDER — ATORVASTATIN CALCIUM 40 MG PO TABS: 40.0000 mg | ORAL_TABLET | Freq: Every day | ORAL | 1 refills | Status: DC

## 2022-06-27 MED ORDER — AMLODIPINE BESYLATE 10 MG PO TABS: 10.0000 mg | ORAL_TABLET | Freq: Every day | ORAL | 1 refills | Status: DC

## 2022-06-27 MED ORDER — LOSARTAN POTASSIUM 100 MG PO TABS: 100.0000 mg | ORAL_TABLET | Freq: Every day | ORAL | 1 refills | Status: DC

## 2022-06-27 MED ORDER — CLOPIDOGREL BISULFATE 75 MG PO TABS: 75.0000 mg | ORAL_TABLET | Freq: Every day | ORAL | 1 refills | Status: DC

## 2022-06-27 MED ORDER — NIACIN ER (ANTIHYPERLIPIDEMIC) 1000 MG PO TBCR: 1000.0000 mg | EXTENDED_RELEASE_TABLET | Freq: Every day | ORAL | 1 refills | Status: DC

## 2022-06-27 NOTE — Progress Notes (Signed)
Subjective:    Patient ID: Dustin Medina, male    DOB: 23-Nov-1950, 72 y.o.   MRN: [id number]   Chief Complaint: medical management of chronic issues     HPI:  Dustin Medina is a 72 y.o. who identifies as a male who was assigned male at birth.   Social history: Lives with: by himself Work history: disability   Comes in today for follow up of the following chronic medical issues:  1. Essential hypertension No c/o chest pain, sob or headache. Does not check blood pressure at home.  2. PVD (peripheral vascular disease) (HCC) Has not seen cardiology in awhile. Last saw peripheral vascular surgeon in 2022.  3. Gastroesophageal reflux disease, unspecified whether esophagitis present Takes protonix daily to keep symptoms under control  4. Neurogenic bowel No issues  5. Paraplegia (HCC) 6. Paraplegic spinal paralysis (HCC) Is confined to wheel chair for the most part. Has equipment at home that helps him to stand for short periods of time but he doe snot use it very often  7. Hyperlipidemia with target LDL less than 100 Does not really watch diet and does not do much exercise. Is on low dose statin Lab Results  Component Value Date   CHOL 180 08/13/2021   HDL 35 (L) 08/13/2021   LDLCALC 122 (H) 08/13/2021   TRIG 127 08/13/2021   CHOLHDL 5.1 (H) 08/13/2021     New complaints: None today  No Known Allergies Outpatient Encounter Medications as of 06/27/2022  Medication Sig   amLODipine (NORVASC) 10 MG tablet Take 1 tablet (10 mg total) by mouth daily. (NEEDS TO BE SEEN BEFORE NEXT REFILL)   atorvastatin (LIPITOR) 10 MG tablet TAKE 1 TABLET (10 MG TOTAL) BY MOUTH DAILY AT 6 PM. (NEEDS TO BE SEEN BEFORE NEXT REFILL)   baclofen (LIORESAL) 10 MG tablet Take 0.5-1 tablets (5-10 mg total) by mouth 3 (three) times daily as needed for muscle spasms.   clopidogrel (PLAVIX) 75 MG tablet Take 1 tablet (75 mg total) by mouth daily.   cyclobenzaprine (FLEXERIL) 10 MG tablet Take 10  mg by mouth daily as needed for muscle spasms.   hydrochlorothiazide (HYDRODIURIL) 25 MG tablet Take 1 tablet (25 mg total) by mouth daily. (NEEDS TO BE SEEN BEFORE NEXT REFILL)   ibuprofen (ADVIL) 200 MG tablet Take 200 mg by mouth every 6 (six) hours as needed for headache or moderate pain.   losartan (COZAAR) 100 MG tablet Take 1 tablet (100 mg total) by mouth daily.   meloxicam (MOBIC) 15 MG tablet Take 1 tablet by mouth daily.   Multiple Vitamin (MULITIVITAMIN WITH MINERALS) TABS Take 1 tablet by mouth every other day.    Nerve Stimulator (PRO COMFORT TENS ELECTRODES) MISC Apply 2 patches topically daily as needed.   Nerve Stimulator (PRO COMFORT TENS UNIT) DEVI Apply 1 Units topically daily as needed.   niacin (NIASPAN) 1000 MG CR tablet Take 1 tablet (1,000 mg total) by mouth at bedtime. (NEEDS TO BE SEEN BEFORE NEXT REFILL)   pantoprazole (PROTONIX) 40 MG tablet Take 1 tablet (40 mg total) by mouth daily. (NEEDS TO BE SEEN BEFORE NEXT REFILL)   promethazine (PHENERGAN) 25 MG tablet Take 1 tablet (25 mg total) by mouth every 8 (eight) hours as needed for nausea or vomiting.   silver sulfADIAZINE (SILVADENE) 1 % cream Apply 1 Application topically daily as needed (wound care).   testosterone cypionate (DEPOTESTOSTERONE CYPIONATE) 200 MG/ML injection INJECT 0.75 MLS (150 MG TOTAL) INTO THE  MUSCLE EVERY 14 (FOURTEEN) DAYS.   traMADol (ULTRAM) 50 MG tablet Take 1 tablet (50 mg total) by mouth every 6 (six) hours as needed for severe pain.   trimethoprim (TRIMPEX) 100 MG tablet TAKE 1 TABLET BY MOUTH EVERY DAY   vitamin C (ASCORBIC ACID) 500 MG tablet Take 500 mg by mouth daily.   zinc gluconate 50 MG tablet Take 50 mg by mouth daily.   No facility-administered encounter medications on file as of 06/27/2022.    Past Surgical History:  Procedure Laterality Date   ABDOMINAL AORTOGRAM W/LOWER EXTREMITY Bilateral 11/19/2018   Procedure: ABDOMINAL AORTOGRAM W/LOWER EXTREMITY;  Surgeon: Chuck Hint, MD;  Location: [facility] INVASIVE CV LAB;  Service: Cardiovascular;  Laterality: Bilateral;   ABDOMINAL AORTOGRAM W/LOWER EXTREMITY Left 01/18/2021   Procedure: ABDOMINAL AORTOGRAM W/LOWER EXTREMITY;  Surgeon: Chuck Hint, MD;  Location: [facility] INVASIVE CV LAB;  Service: Cardiovascular;  Laterality: Left;   BACK SURGERY  1969   carpal tunnel Right 11/23/13   coloncoscopy  2008   Dr. Karilyn Cota: few small diverticula at ascending colon and external hemorrhoids, otherwise normal   COLONOSCOPY WITH PROPOFOL N/A 04/16/2017   Procedure: COLONOSCOPY WITH PROPOFOL;  Surgeon: Corbin Ade, MD;  Location: [facility] SU[facility];  Service: Endoscopy;  Laterality: N/A;  11:15am   dilation of esophagus     ESOPHAGOGASTRODUODENOSCOPY N/A 07/26/2012   [id number] Schatzki's ring. Hiatal hernia, likely upper GI bleed secondary to MW tear   HERNIA REPAIR  02/03/2001   paraplegia and right inguinal hernia   left leg surgery due to staph infection  2005   LIPOMA EXCISION  07/07/2011   Procedure: EXCISION LIPOMA;  Surgeon: Wilmon Arms. [name], MD;  Location: [facility] ORS;  Service: General;  Laterality: Right;   LOWER EXTREMITY ANGIOGRAPHY N/A 07/16/2018   Procedure: LOWER EXTREMITY ANGIOGRAPHY;  Surgeon: Chuck Hint, MD;  Location: [facility] INVASIVE CV LAB;  Service: Cardiovascular;  Laterality: N/A;   MULTIPLE TOOTH EXTRACTIONS     PERIPHERAL VASCULAR INTERVENTION  07/16/2018   Procedure: PERIPHERAL VASCULAR INTERVENTION;  Surgeon: Chuck Hint, MD;  Location: [facility] INVASIVE CV LAB;  Service: Cardiovascular;;  right common iliac   PERIPHERAL VASCULAR INTERVENTION Left 01/18/2021   Procedure: PERIPHERAL VASCULAR INTERVENTION;  Surgeon: Chuck Hint, MD;  Location: [facility] INVASIVE CV LAB;  Service: Cardiovascular;  Laterality: Left;  SFA / common iliac   POLYPECTOMY  04/16/2017   Procedure: POLYPECTOMY;  Surgeon: Corbin Ade, MD;  Location: [facility];  Service: Endoscopy;;  colon   SURGERY FOR  DECUBITUS ULCER      Family History  Problem Relation Age of Onset   COPD Father    Heart disease Father    Aneurysm Father    Hyperlipidemia Mother    Hypertension Mother    Diabetes Sister    Arrhythmia Daughter    Stroke Sister    Colon cancer Maternal Grandfather    Esophageal cancer Neg Hx       Controlled substance contract: n/a     Review of Systems  Constitutional:  Negative for diaphoresis.  Eyes:  Negative for pain.  Respiratory:  Negative for shortness of breath.   Cardiovascular:  Negative for chest pain, palpitations and leg swelling.  Gastrointestinal:  Negative for abdominal pain.  Endocrine: Negative for polydipsia.  Skin:  Negative for rash.  Neurological:  Negative for dizziness, weakness and headaches.  Hematological:  Does not bruise/bleed easily.  All other systems reviewed and are negative.  Objective:   Physical Exam Vitals and nursing note reviewed.  Constitutional:      Appearance: Normal appearance. He is well-developed.  HENT:     Head: Normocephalic.     Nose: Nose normal.     Mouth/Throat:     Mouth: Mucous membranes are moist.     Pharynx: Oropharynx is clear.  Eyes:     Pupils: Pupils are equal, round, and reactive to light.  Neck:     Thyroid: No thyroid mass or thyromegaly.     Vascular: No carotid bruit or JVD.     Trachea: Phonation normal.  Cardiovascular:     Rate and Rhythm: Normal rate and regular rhythm.  Pulmonary:     Effort: Pulmonary effort is normal. No respiratory distress.     Breath sounds: Normal breath sounds.  Abdominal:     General: Bowel sounds are normal.     Palpations: Abdomen is soft.     Tenderness: There is no abdominal tenderness.  Musculoskeletal:        General: Normal range of motion.     Cervical back: Normal range of motion and neck supple.     Comments: Sitting in wheel chair  Lymphadenopathy:     Cervical: No cervical adenopathy.  Skin:    General: Skin is warm and dry.   Neurological:     Mental Status: He is alert and oriented to person, place, and time.  Psychiatric:        Behavior: Behavior normal.        Thought Content: Thought content normal.        Judgment: Judgment normal.    BP (!) 148/88   Pulse 83   Temp (!) 97.4 F (36.3 C)   Ht 5\' 11"  (1.803 m)   Wt 155 lb (70.3 kg) Comment: Per pt, parapalegic  SpO2 97%   BMI 21.62 kg/m         Assessment & Plan:   Dustin Medina comes in today with chief complaint of Medical Management of Chronic Issues and Hypertension   Diagnosis and orders addressed:  1. Essential hypertension Low sodium diet - amLODipine (NORVASC) 10 MG tablet; Take 1 tablet (10 mg total) by mouth daily. (NEEDS TO BE SEEN BEFORE NEXT REFILL)  Dispense: 90 tablet; Refill: 1 - hydrochlorothiazide (HYDRODIURIL) 25 MG tablet; Take 1 tablet (25 mg total) by mouth daily. (NEEDS TO BE SEEN BEFORE NEXT REFILL)  Dispense: 90 tablet; Refill: 1 - losartan (COZAAR) 100 MG tablet; Take 1 tablet (100 mg total) by mouth daily.  Dispense: 90 tablet; Refill: 1  2. PVD (peripheral vascular disease) (HCC) Keep coming up appointment with vascular surgeon - clopidogrel (PLAVIX) 75 MG tablet; Take 1 tablet (75 mg total) by mouth daily.  Dispense: 90 tablet; Refill: 1  3. Gastroesophageal reflux disease, unspecified whether esophagitis present Avoid spicy foods Do not eat 2 hours prior to bedtime   4. Neurogenic bowel  5. Paraplegia (HCC) 6. Paraplegic spinal paralysis (HCC) Change positions in chair frequently - testosterone cypionate (DEPOTESTOSTERONE CYPIONATE) 200 MG/ML injection; Inject 0.75 mLs (150 mg total) into the muscle every 14 (fourteen) days.  Dispense: 6 mL; Refill: 5  7. Hyperlipidemia with target LDL less than 100 Low fat diet - atorvastatin (LIPITOR) 40 MG tablet; Take 1 tablet (40 mg total) by mouth daily.  Dispense: 90 tablet; Refill: 1 - niacin (NIASPAN) 1000 MG CR tablet; Take 1 tablet (1,000 mg total) by  mouth at bedtime. (NEEDS TO BE SEEN  BEFORE NEXT REFILL)  Dispense: 90 tablet; Refill: 1  8. Gastroesophageal reflux disease with esophagitis and hemorrhage Avoid spicy foods Do not eat 2 hours prior to bedtime  - pantoprazole (PROTONIX) 40 MG tablet; Take 1 tablet (40 mg total) by mouth daily. (NEEDS TO BE SEEN BEFORE NEXT REFILL)  Dispense: 90 tablet; Refill: 1   Labs pending Health Maintenance reviewed Diet and exercise encouraged  Follow up plan: 6 months   [name] Daphine Deutscher, FNP

## 2022-06-27 NOTE — Patient Instructions (Signed)
Preventing Pressure Injuries  A pressure injury, also called a pressure ulcer or bedsore, is an injury to the skin and the tissue under the skin that is caused by pressure. It can happen when your skin presses against a surface, such as a mattress or the seat of a wheelchair, for too long. The pressure on the blood vessels causes reduced blood flow to your skin. Over time, this can make the tissue die and break down, causing a wound. Pressure injuries often occur: Over bony parts of the body, such as the tailbone, shoulders, elbows, hips, heels, spine, ankles, and back of the head. Under medical devices, such as stockings, equipment to help with breathing, tubes, and splints. Inside the mouth or nose from dentures or tubes. How can pressure injuries affect me? Pressure injuries are caused by a lack of blood supply to an area of the skin. These injuries begin as a red or dark area on the skin and can become an open sore. They can come from a lot of pressure on the skin over a short period of time or from less pressure over a long period of time. Pressure injuries may be mild, moderate, or severe. They can cause pain, damage to your muscles, and infection. What can increase my risk for pressure injuries? You are more likely to develop this condition if: You are in the hospital or an extended care facility. You are bedridden or in a wheelchair. You have an injury or disease that keeps you from: Moving like normal. Feeling pain or pressure. Telling someone if you feel pain or pressure. You have a condition that: Makes you sleepy or less alert. Causes poor blood flow. You need to wear a medical device. You have poor control of your bladder or bowel movements (incontinence). You are not getting enough fluid or nutrients (malnutrition). You have had this condition before. What actions can I take to prevent pressure injuries? Reducing pressure Do not lie or sit in one position for a long time.  Move or change position as often as told by your health care provider. You may need to move: Every hour when out of bed in a chair. Every 2 hours when in bed. Use pillows or cushions to reduce pressure on certain areas of the body. Ask your health care provider to recommend a mattress, mattress cover, cushions, pads, or heel protectors. These may include products filled with air, foam, gel, or sand. Use medical devices that do not rub your skin. Tell your health care provider if one of your medical devices is causing pain or irritation. Skin care in the hospital If you are in the hospital, your health care providers will help you care for your skin. They may: Inspect your skin at least twice a day. This includes the areas under or around medical devices. Assess your nutrition and consult a dietitian if needed. Perform regular wound care. Help you move into a different position every few hours and adjust any medical devices. Keep your skin clean and dry. Use gentle cleansers and skin protectants if you are incontinent. Moisturize your dry skin. Skin care at home  Keep your skin clean and dry. Gently pat your skin dry. Do not rub or massage skin that is on bony areas. Moisturize dry skin. Use foams, creams, or powders to protect your skin from sweat, urine, and stool and reduce rubbing (friction) on the skin. Check your skin every day for any changes in color or any new blisters or sores. Check   under and around any medical devices and between skinfolds. Have a caregiver do this for you if you are not able. Nutrition  Drink enough fluid to keep your urine pale yellow. Eat a healthy diet that includes protein, vitamins, and minerals. Ask your health care provider what types of food you should eat. Lifestyle Do not use drugs or drink alcohol. Do not use any products that contain nicotine or tobacco. These products include cigarettes, chewing tobacco, and vaping devices, such as e-cigarettes. If  you need help quitting, ask your health care provider. Try to be active every day. Ask your health care provider what exercises or activities are safe for you. General instructions Take over-the-counter and prescription medicines only as told by your health care provider. Keep all follow-up visits. Work with your health care provider to manage any long-term (chronic) conditions. Where to find more information The National Pressure Injury Advisory Panel (NPIAP): npiap.com Contact a health care provider if: You feel or see any changes to your skin. This information is not intended to replace advice given to you by your health care provider. Make sure you discuss any questions you have with your health care provider. Document Revised: 07/05/2021 Document Reviewed: 07/05/2021 Elsevier Patient Education  2023 [employer]  

## 2022-06-28 LAB — CMP14+EGFR
ALT: 15 IU/L (ref 0–44)
AST: 19 IU/L (ref 0–40)
Albumin/Globulin Ratio: 1.2 (ref 1.2–2.2)
Albumin: 3.9 g/dL (ref 3.9–4.9)
Alkaline Phosphatase: 109 IU/L (ref 44–121)
BUN/Creatinine Ratio: 20 (ref 10–24)
BUN: 10 mg/dL (ref 8–27)
Bilirubin Total: 0.4 mg/dL (ref 0.0–1.2)
CO2: 23 mmol/L (ref 20–29)
Calcium: 8.9 mg/dL (ref 8.6–10.2)
Chloride: 102 mmol/L (ref 96–106)
Creatinine, Ser: 0.5 mg/dL — ABNORMAL LOW (ref 0.76–1.27)
Globulin, Total: 3.3 g/dL (ref 1.5–4.5)
Glucose: 95 mg/dL (ref 70–99)
Potassium: 4.1 mmol/L (ref 3.5–5.2)
Sodium: 140 mmol/L (ref 134–144)
Total Protein: 7.2 g/dL (ref 6.0–8.5)
eGFR: 110 mL/min/{1.73_m2} (ref >59)

## 2022-06-28 LAB — CBC WITH DIFFERENTIAL/PLATELET
Basophils Absolute: 0.1 10*3/uL (ref 0.0–0.2)
Basos: 1 %
EOS (ABSOLUTE): 0.2 10*3/uL (ref 0.0–0.4)
Eos: 1 %
Hematocrit: 41.4 % (ref 37.5–51.0)
Hemoglobin: 12.8 g/dL — ABNORMAL LOW (ref 13.0–17.7)
Immature Grans (Abs): 0 10*3/uL (ref 0.0–0.1)
Immature Granulocytes: 0 %
Lymphocytes Absolute: 4.5 10*3/uL — ABNORMAL HIGH (ref 0.7–3.1)
Lymphs: 39 %
MCH: 24.2 pg — ABNORMAL LOW (ref 26.6–33.0)
MCHC: 30.9 g/dL — ABNORMAL LOW (ref 31.5–35.7)
MCV: 78 fL — ABNORMAL LOW (ref 79–97)
Monocytes Absolute: 0.8 10*3/uL (ref 0.1–0.9)
Monocytes: 7 %
Neutrophils Absolute: 5.8 10*3/uL (ref 1.4–7.0)
Neutrophils: 52 %
Platelets: 444 10*3/uL (ref 150–450)
RBC: 5.3 x10E6/uL (ref 4.14–5.80)
RDW: 16.6 % — ABNORMAL HIGH (ref 11.6–15.4)
WBC: 11.4 10*3/uL — ABNORMAL HIGH (ref 3.4–10.8)

## 2022-06-28 LAB — LIPID PANEL
Chol/HDL Ratio: 3.6 ratio (ref 0.0–5.0)
Cholesterol, Total: 160 mg/dL (ref 100–199)
HDL: 45 mg/dL (ref >39)
LDL Chol Calc (NIH): 98 mg/dL (ref 0–99)
Triglycerides: 92 mg/dL (ref 0–149)
VLDL Cholesterol Cal: 17 mg/dL (ref 5–40)

## 2022-06-28 LAB — PSA: Prostate Specific Ag, Serum: 11.7 ng/mL — ABNORMAL HIGH (ref 0.0–4.0)

## 2022-07-07 ENCOUNTER — Other Ambulatory Visit: Payer: Self-pay | Admitting: *Deleted

## 2022-07-07 DIAGNOSIS — L97909 Non-pressure chronic ulcer of unspecified part of unspecified lower leg with unspecified severity: Secondary | ICD-10-CM

## 2022-07-07 DIAGNOSIS — I739 Peripheral vascular disease, unspecified: Secondary | ICD-10-CM

## 2022-07-17 ENCOUNTER — Encounter: Payer: Self-pay | Admitting: Vascular Surgery

## 2022-07-17 ENCOUNTER — Ambulatory Visit (INDEPENDENT_AMBULATORY_CARE_PROVIDER_SITE_OTHER)
Admission: RE | Admit: 2022-07-17 | Discharge: 2022-07-17 | Disposition: A | Discharge status: Home / Self Care | Payer: Medicare HMO | Source: Ambulatory Visit | Attending: Vascular Surgery | Admitting: Vascular Surgery

## 2022-07-17 ENCOUNTER — Ambulatory Visit (HOSPITAL_COMMUNITY)
Admission: RE | Admit: 2022-07-17 | Discharge: 2022-07-17 | Disposition: A | Discharge status: Home / Self Care | Payer: Medicare HMO | Source: Ambulatory Visit | Attending: Vascular Surgery | Admitting: Vascular Surgery

## 2022-07-17 ENCOUNTER — Ambulatory Visit: Payer: Medicare HMO | Admitting: Vascular Surgery

## 2022-07-17 VITALS — BP 150/83 | HR 75 | Temp 98.2°F | Resp 20 | Ht 71.0 in | Wt 155.0 lb

## 2022-07-17 DIAGNOSIS — I70299 Other atherosclerosis of native arteries of extremities, unspecified extremity: Secondary | ICD-10-CM

## 2022-07-17 DIAGNOSIS — R339 Retention of urine, unspecified: Secondary | ICD-10-CM | POA: Diagnosis not present

## 2022-07-17 DIAGNOSIS — L97909 Non-pressure chronic ulcer of unspecified part of unspecified lower leg with unspecified severity: Secondary | ICD-10-CM

## 2022-07-17 DIAGNOSIS — I739 Peripheral vascular disease, unspecified: Secondary | ICD-10-CM | POA: Insufficient documentation

## 2022-07-17 LAB — VAS US ABI WITH/WO TBI
Left ABI: 0.69
Right ABI: 0.9
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
## 2022-07-21 ENCOUNTER — Telehealth: Payer: Self-pay

## 2022-07-21 DIAGNOSIS — H5213 Myopia, bilateral: Secondary | ICD-10-CM | POA: Diagnosis not present

## 2022-07-21 DIAGNOSIS — H524 Presbyopia: Secondary | ICD-10-CM | POA: Diagnosis not present

## 2022-07-21 DIAGNOSIS — H52229 Regular astigmatism, unspecified eye: Secondary | ICD-10-CM | POA: Diagnosis not present

## 2022-07-21 DIAGNOSIS — H5203 Hypermetropia, bilateral: Secondary | ICD-10-CM | POA: Diagnosis not present

## 2022-07-21 DIAGNOSIS — H5210 Myopia, unspecified eye: Secondary | ICD-10-CM | POA: Diagnosis not present

## 2022-07-21 NOTE — Telephone Encounter (Signed)
Attempted to reach patient to schedule aortogram. Left VM for patient to return call.  

## 2022-07-22 DIAGNOSIS — Z7902 Long term (current) use of antithrombotics/antiplatelets: Secondary | ICD-10-CM | POA: Diagnosis not present

## 2022-07-22 DIAGNOSIS — L89523 Pressure ulcer of left ankle, stage 3: Secondary | ICD-10-CM | POA: Diagnosis not present

## 2022-07-22 DIAGNOSIS — L89159 Pressure ulcer of sacral region, unspecified stage: Secondary | ICD-10-CM | POA: Diagnosis not present

## 2022-07-22 DIAGNOSIS — L89214 Pressure ulcer of right hip, stage 4: Secondary | ICD-10-CM | POA: Diagnosis not present

## 2022-07-22 DIAGNOSIS — H524 Presbyopia: Secondary | ICD-10-CM | POA: Diagnosis not present

## 2022-07-22 DIAGNOSIS — G8220 Paraplegia, unspecified: Secondary | ICD-10-CM | POA: Diagnosis not present

## 2022-07-22 DIAGNOSIS — I10 Essential (primary) hypertension: Secondary | ICD-10-CM | POA: Diagnosis not present

## 2022-07-22 DIAGNOSIS — Z792 Long term (current) use of antibiotics: Secondary | ICD-10-CM | POA: Diagnosis not present

## 2022-07-22 DIAGNOSIS — G8929 Other chronic pain: Secondary | ICD-10-CM | POA: Diagnosis not present

## 2022-07-22 DIAGNOSIS — H52222 Regular astigmatism, left eye: Secondary | ICD-10-CM | POA: Diagnosis not present

## 2022-07-22 DIAGNOSIS — E7800 Pure hypercholesterolemia, unspecified: Secondary | ICD-10-CM | POA: Diagnosis not present

## 2022-07-22 DIAGNOSIS — I739 Peripheral vascular disease, unspecified: Secondary | ICD-10-CM | POA: Diagnosis not present

## 2022-07-22 DIAGNOSIS — Z8614 Personal history of Methicillin resistant Staphylococcus aureus infection: Secondary | ICD-10-CM | POA: Diagnosis not present

## 2022-07-22 DIAGNOSIS — M549 Dorsalgia, unspecified: Secondary | ICD-10-CM | POA: Diagnosis not present

## 2022-07-22 NOTE — Telephone Encounter (Signed)
Left VM for patient to return call.   Spoke with friend, Lawson Fiscal [facility] per DPR on file. Lawson Fiscal stated she will have patient contact our office back to schedule procedure.

## 2022-07-24 ENCOUNTER — Other Ambulatory Visit: Payer: Self-pay

## 2022-07-24 DIAGNOSIS — L97909 Non-pressure chronic ulcer of unspecified part of unspecified lower leg with unspecified severity: Secondary | ICD-10-CM

## 2022-08-01 DIAGNOSIS — I10 Essential (primary) hypertension: Secondary | ICD-10-CM | POA: Diagnosis not present

## 2022-08-01 DIAGNOSIS — L89523 Pressure ulcer of left ankle, stage 3: Secondary | ICD-10-CM | POA: Diagnosis not present

## 2022-08-01 DIAGNOSIS — Z7902 Long term (current) use of antithrombotics/antiplatelets: Secondary | ICD-10-CM | POA: Diagnosis not present

## 2022-08-01 DIAGNOSIS — I739 Peripheral vascular disease, unspecified: Secondary | ICD-10-CM | POA: Diagnosis not present

## 2022-08-01 DIAGNOSIS — G8220 Paraplegia, unspecified: Secondary | ICD-10-CM | POA: Diagnosis not present

## 2022-08-01 DIAGNOSIS — E7800 Pure hypercholesterolemia, unspecified: Secondary | ICD-10-CM | POA: Diagnosis not present

## 2022-08-01 DIAGNOSIS — L89214 Pressure ulcer of right hip, stage 4: Secondary | ICD-10-CM | POA: Diagnosis not present

## 2022-08-01 DIAGNOSIS — M549 Dorsalgia, unspecified: Secondary | ICD-10-CM | POA: Diagnosis not present

## 2022-08-01 DIAGNOSIS — Z792 Long term (current) use of antibiotics: Secondary | ICD-10-CM | POA: Diagnosis not present

## 2022-08-01 DIAGNOSIS — L89154 Pressure ulcer of sacral region, stage 4: Secondary | ICD-10-CM | POA: Diagnosis not present

## 2022-08-01 DIAGNOSIS — L89159 Pressure ulcer of sacral region, unspecified stage: Secondary | ICD-10-CM | POA: Diagnosis not present

## 2022-08-01 DIAGNOSIS — G8929 Other chronic pain: Secondary | ICD-10-CM | POA: Diagnosis not present

## 2022-08-01 DIAGNOSIS — Z8614 Personal history of Methicillin resistant Staphylococcus aureus infection: Secondary | ICD-10-CM | POA: Diagnosis not present

## 2022-08-04 ENCOUNTER — Telehealth: Payer: Self-pay | Admitting: Vascular Surgery

## 2022-08-04 ENCOUNTER — Ambulatory Visit (HOSPITAL_COMMUNITY)
Admission: RE | Admit: 2022-08-04 | Discharge: 2022-08-04 | Disposition: A | Discharge status: Home / Self Care | Payer: Medicare HMO | Attending: Vascular Surgery | Admitting: Vascular Surgery

## 2022-08-04 ENCOUNTER — Encounter (HOSPITAL_COMMUNITY)
Admission: RE | Disposition: A | Discharge status: Home / Self Care | Payer: Self-pay | Source: Home / Self Care | Attending: Vascular Surgery

## 2022-08-04 ENCOUNTER — Other Ambulatory Visit: Payer: Self-pay

## 2022-08-04 DIAGNOSIS — Z87891 Personal history of nicotine dependence: Secondary | ICD-10-CM | POA: Insufficient documentation

## 2022-08-04 DIAGNOSIS — L97909 Non-pressure chronic ulcer of unspecified part of unspecified lower leg with unspecified severity: Secondary | ICD-10-CM

## 2022-08-04 DIAGNOSIS — I70243 Atherosclerosis of native arteries of left leg with ulceration of ankle: Secondary | ICD-10-CM | POA: Diagnosis not present

## 2022-08-04 DIAGNOSIS — L97329 Non-pressure chronic ulcer of left ankle with unspecified severity: Secondary | ICD-10-CM | POA: Insufficient documentation

## 2022-08-04 DIAGNOSIS — G8220 Paraplegia, unspecified: Secondary | ICD-10-CM | POA: Insufficient documentation

## 2022-08-04 DIAGNOSIS — I771 Stricture of artery: Secondary | ICD-10-CM | POA: Diagnosis not present

## 2022-08-04 DIAGNOSIS — I70245 Atherosclerosis of native arteries of left leg with ulceration of other part of foot: Secondary | ICD-10-CM | POA: Diagnosis not present

## 2022-08-04 HISTORY — PX: ABDOMINAL AORTOGRAM W/LOWER EXTREMITY: CATH118223

## 2022-08-04 HISTORY — PX: PERIPHERAL VASCULAR INTERVENTION: CATH118257

## 2022-08-04 LAB — POCT I-STAT, CHEM 8
BUN: 12 mg/dL (ref 8–23)
Calcium, Ion: 1.13 mmol/L — ABNORMAL LOW (ref 1.15–1.40)
Chloride: 102 mmol/L (ref 98–111)
Creatinine, Ser: 0.7 mg/dL (ref 0.61–1.24)
Glucose, Bld: 98 mg/dL (ref 70–99)
HCT: 39 % (ref 39.0–52.0)
Hemoglobin: 13.3 g/dL (ref 13.0–17.0)
Potassium: 3.7 mmol/L (ref 3.5–5.1)
Sodium: 139 mmol/L (ref 135–145)
TCO2: 27 mmol/L (ref 22–32)

## 2022-08-04 LAB — POCT ACTIVATED CLOTTING TIME
Activated Clotting Time: 207 seconds
Activated Clotting Time: 171 s

## 2022-08-04 SURGERY — ABDOMINAL AORTOGRAM W/LOWER EXTREMITY
Anesthesia: LOCAL

## 2022-08-04 MED ORDER — FENTANYL CITRATE (PF) 100 MCG/2ML IJ SOLN
INTRAMUSCULAR | Status: DC | PRN
  Administered 2022-08-04: 50 ug via INTRAVENOUS

## 2022-08-04 MED ORDER — LABETALOL HCL 5 MG/ML IV SOLN: 10.0000 mg | INTRAVENOUS | Status: DC | PRN

## 2022-08-04 MED ORDER — HEPARIN SODIUM (PORCINE) 1000 UNIT/ML IJ SOLN
INTRAMUSCULAR | Status: AC
  Filled 2022-08-04: qty 10

## 2022-08-04 MED ORDER — ACETAMINOPHEN 325 MG PO TABS: 650.0000 mg | ORAL_TABLET | ORAL | Status: DC | PRN

## 2022-08-04 MED ORDER — MIDAZOLAM HCL 2 MG/2ML IJ SOLN
INTRAMUSCULAR | Status: DC | PRN
  Administered 2022-08-04: 1 mg via INTRAVENOUS

## 2022-08-04 MED ORDER — HYDRALAZINE HCL 20 MG/ML IJ SOLN: 5.0000 mg | INTRAMUSCULAR | Status: DC | PRN

## 2022-08-04 MED ORDER — HEPARIN SODIUM (PORCINE) 1000 UNIT/ML IJ SOLN
INTRAMUSCULAR | Status: DC | PRN
  Administered 2022-08-04: 7000 [IU] via INTRAVENOUS

## 2022-08-04 MED ORDER — ONDANSETRON HCL 4 MG/2ML IJ SOLN: 4.0000 mg | Freq: Four times a day (QID) | INTRAMUSCULAR | Status: DC | PRN

## 2022-08-04 MED ORDER — SODIUM CHLORIDE 0.9 % IV SOLN: INTRAVENOUS | Status: DC

## 2022-08-04 MED ORDER — SODIUM CHLORIDE 0.9 % IV SOLN: 250.0000 mL | INTRAVENOUS | Status: DC | PRN

## 2022-08-04 MED ORDER — IODIXANOL 320 MG/ML IV SOLN
INTRAVENOUS | Status: DC | PRN
  Administered 2022-08-04: 110 mL

## 2022-08-04 MED ORDER — LIDOCAINE HCL (PF) 1 % IJ SOLN
INTRAMUSCULAR | Status: AC
  Filled 2022-08-04: qty 30

## 2022-08-04 MED ORDER — SODIUM CHLORIDE 0.9% FLUSH: 3.0000 mL | Freq: Two times a day (BID) | INTRAVENOUS | Status: DC

## 2022-08-04 MED ORDER — MIDAZOLAM HCL 2 MG/2ML IJ SOLN
INTRAMUSCULAR | Status: AC
  Filled 2022-08-04: qty 2

## 2022-08-04 MED ORDER — LIDOCAINE HCL (PF) 1 % IJ SOLN
INTRAMUSCULAR | Status: DC | PRN
  Administered 2022-08-04: 15 mL

## 2022-08-04 MED ORDER — SODIUM CHLORIDE 0.9 % WEIGHT BASED INFUSION: 1.0000 mL/kg/h | INTRAVENOUS | Status: DC

## 2022-08-04 MED ORDER — FENTANYL CITRATE (PF) 100 MCG/2ML IJ SOLN
INTRAMUSCULAR | Status: AC
  Filled 2022-08-04: qty 2

## 2022-08-04 MED ORDER — HEPARIN (PORCINE) IN NACL 1000-0.9 UT/500ML-% IV SOLN
INTRAVENOUS | Status: DC | PRN
  Administered 2022-08-04 (×2): 500 mL

## 2022-08-04 MED ORDER — SODIUM CHLORIDE 0.9% FLUSH: 3.0000 mL | INTRAVENOUS | Status: DC | PRN

## 2022-08-04 SURGICAL SUPPLY — 14 items
CATH ANGIO 5F PIGTAIL 65CM (CATHETERS) IMPLANT
KIT ENCORE 26 ADVANTAGE (KITS) IMPLANT
KIT MICROPUNCTURE NIT STIFF (SHEATH) IMPLANT
KIT PV (KITS) ×2 IMPLANT
SHEATH BRITE TIP 6FR 35CM (SHEATH) IMPLANT
SHEATH PINNACLE 5F 10CM (SHEATH) IMPLANT
SHEATH PROBE COVER 6X72 (BAG) IMPLANT
STENT VIABAHN 7X19 6FR 80 (Permanent Stent) IMPLANT
STOPCOCK MORSE 400PSI 3WAY (MISCELLANEOUS) IMPLANT
SYR MEDRAD MARK 7 150ML (SYRINGE) ×2 IMPLANT
TRANSDUCER W/STOPCOCK (MISCELLANEOUS) ×2 IMPLANT
TRAY PV CATH (CUSTOM PROCEDURE TRAY) ×2 IMPLANT
TUBING CIL FLEX 10 FLL-RA (TUBING) IMPLANT
WIRE STARTER BENTSON 035X150 (WIRE) IMPLANT

## 2022-08-04 NOTE — Interval H&P Note (Signed)
History and Physical Interval Note:  08/04/2022 10:20 AM  Dustin Medina  has presented today for surgery, with the diagnosis of PAD with ulcer.  The various methods of treatment have been discussed with the patient and family. After consideration of risks, benefits and other options for treatment, the patient has consented to  Procedure(s): ABDOMINAL AORTOGRAM W/LOWER EXTREMITY (N/A) as a surgical intervention.  The patient's history has been reviewed, patient examined, no change in status, stable for surgery.  I have reviewed the patient's chart and labs.  Questions were answered to the patient's satisfaction.     Waverly Ferrari

## 2022-08-04 NOTE — Progress Notes (Signed)
Patient was given discharge instructions. He verbalized understanding. 

## 2022-08-04 NOTE — Telephone Encounter (Signed)
lvm to schedule appt.  

## 2022-08-04 NOTE — Telephone Encounter (Signed)
-----   Message from Chuck Hint, MD sent at 08/04/2022 12:02 PM EDT ----- Regarding: charge and f/u  PROCEDURE:    1. Conscious sedation 2. Ultrasound-guided access to the left common femoral artery 3. Aortogram with bilateral iliac arteriogram 4. Angioplasty and stenting of the left common iliac artery (7 mm x 19 mm VBX stent) 5. Retrograde left femoral arteriogram with left lower extremity runoff   SURGEON: Di Kindle. Edilia Bo, MD, FACS  He needs a follow-up visit with me in 3 to 4 weeks.  At that time he will need a vein map of his left great saphenous vein.  He is going to be considered for femoropopliteal bypass if his wound is not improving.  Thank you.  Thayer Ohm

## 2022-08-04 NOTE — Progress Notes (Signed)
6 fr sheath in left femoral artery removed at 1405. Sheath aspirated and flushed successfully prior to sheath removal. Bed rest begins at 1435. Manual pressure held at left femoral artery site for 30 minutes. Distal PT pulses detected by doppler pre and post sheath removal. Pt verbalized understanding of bed rest instructions and understands prevention of possible complications.
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
## 2022-08-04 NOTE — Progress Notes (Signed)
Patient was in and out cath. 800 mls of clear yellow urine out. Patient stated that he felt relief.

## 2022-08-05 ENCOUNTER — Encounter (HOSPITAL_COMMUNITY): Payer: Self-pay | Admitting: Vascular Surgery

## 2022-08-12 LAB — POCT ACTIVATED CLOTTING TIME
Activated Clotting Time: 262 seconds
Activated Clotting Time: 232 seconds

## 2022-08-14 DIAGNOSIS — R339 Retention of urine, unspecified: Secondary | ICD-10-CM | POA: Diagnosis not present

## 2022-08-15 DIAGNOSIS — L97519 Non-pressure chronic ulcer of other part of right foot with unspecified severity: Secondary | ICD-10-CM | POA: Diagnosis not present

## 2022-08-15 DIAGNOSIS — L89214 Pressure ulcer of right hip, stage 4: Secondary | ICD-10-CM | POA: Diagnosis not present

## 2022-08-15 DIAGNOSIS — L89219 Pressure ulcer of right hip, unspecified stage: Secondary | ICD-10-CM | POA: Diagnosis not present

## 2022-08-15 DIAGNOSIS — L89524 Pressure ulcer of left ankle, stage 4: Secondary | ICD-10-CM | POA: Diagnosis not present

## 2022-08-15 DIAGNOSIS — L97529 Non-pressure chronic ulcer of other part of left foot with unspecified severity: Secondary | ICD-10-CM | POA: Diagnosis not present

## 2022-08-15 DIAGNOSIS — L89523 Pressure ulcer of left ankle, stage 3: Secondary | ICD-10-CM | POA: Diagnosis not present

## 2022-08-15 DIAGNOSIS — L89892 Pressure ulcer of other site, stage 2: Secondary | ICD-10-CM | POA: Diagnosis not present

## 2022-08-15 DIAGNOSIS — L89224 Pressure ulcer of left hip, stage 4: Secondary | ICD-10-CM | POA: Diagnosis not present

## 2022-08-15 DIAGNOSIS — Z7902 Long term (current) use of antithrombotics/antiplatelets: Secondary | ICD-10-CM | POA: Diagnosis not present

## 2022-08-15 DIAGNOSIS — G8220 Paraplegia, unspecified: Secondary | ICD-10-CM | POA: Diagnosis not present

## 2022-08-15 NOTE — Telephone Encounter (Signed)
Appt has been scheduled.

## 2022-08-26 ENCOUNTER — Other Ambulatory Visit: Payer: Self-pay | Admitting: *Deleted

## 2022-08-27 ENCOUNTER — Encounter: Payer: Medicare HMO | Attending: Physical Medicine and Rehabilitation | Admitting: Physical Medicine and Rehabilitation

## 2022-08-27 ENCOUNTER — Encounter: Payer: Self-pay | Admitting: Physical Medicine and Rehabilitation

## 2022-08-27 ENCOUNTER — Other Ambulatory Visit: Payer: Self-pay | Admitting: *Deleted

## 2022-08-27 VITALS — BP 157/85 | HR 80 | Ht 71.0 in

## 2022-08-27 DIAGNOSIS — I70299 Other atherosclerosis of native arteries of extremities, unspecified extremity: Secondary | ICD-10-CM

## 2022-08-27 DIAGNOSIS — I739 Peripheral vascular disease, unspecified: Secondary | ICD-10-CM

## 2022-08-27 DIAGNOSIS — R252 Cramp and spasm: Secondary | ICD-10-CM

## 2022-08-27 DIAGNOSIS — G894 Chronic pain syndrome: Secondary | ICD-10-CM | POA: Diagnosis not present

## 2022-08-27 DIAGNOSIS — Z993 Dependence on wheelchair: Secondary | ICD-10-CM

## 2022-08-27 DIAGNOSIS — L89523 Pressure ulcer of left ankle, stage 3: Secondary | ICD-10-CM

## 2022-08-27 DIAGNOSIS — G8220 Paraplegia, unspecified: Secondary | ICD-10-CM | POA: Diagnosis not present
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
## 2022-08-28 ENCOUNTER — Encounter (HOSPITAL_COMMUNITY): Payer: Self-pay | Admitting: Vascular Surgery

## 2022-08-29 DIAGNOSIS — L89214 Pressure ulcer of right hip, stage 4: Secondary | ICD-10-CM | POA: Diagnosis not present

## 2022-08-29 DIAGNOSIS — L97529 Non-pressure chronic ulcer of other part of left foot with unspecified severity: Secondary | ICD-10-CM | POA: Diagnosis not present

## 2022-08-29 DIAGNOSIS — L97519 Non-pressure chronic ulcer of other part of right foot with unspecified severity: Secondary | ICD-10-CM | POA: Diagnosis not present

## 2022-08-29 DIAGNOSIS — L89154 Pressure ulcer of sacral region, stage 4: Secondary | ICD-10-CM | POA: Diagnosis not present

## 2022-08-29 DIAGNOSIS — Z7902 Long term (current) use of antithrombotics/antiplatelets: Secondary | ICD-10-CM | POA: Diagnosis not present

## 2022-08-29 DIAGNOSIS — L89219 Pressure ulcer of right hip, unspecified stage: Secondary | ICD-10-CM | POA: Diagnosis not present

## 2022-08-29 DIAGNOSIS — L89524 Pressure ulcer of left ankle, stage 4: Secondary | ICD-10-CM | POA: Diagnosis not present

## 2022-08-29 DIAGNOSIS — G8220 Paraplegia, unspecified: Secondary | ICD-10-CM | POA: Diagnosis not present

## 2022-08-29 DIAGNOSIS — L89892 Pressure ulcer of other site, stage 2: Secondary | ICD-10-CM | POA: Diagnosis not present

## 2022-08-29 DIAGNOSIS — L89523 Pressure ulcer of left ankle, stage 3: Secondary | ICD-10-CM | POA: Diagnosis not present

## 2022-08-29 DIAGNOSIS — L89224 Pressure ulcer of left hip, stage 4: Secondary | ICD-10-CM | POA: Diagnosis not present

## 2022-09-11 ENCOUNTER — Ambulatory Visit (INDEPENDENT_AMBULATORY_CARE_PROVIDER_SITE_OTHER): Payer: Medicare HMO | Admitting: Physician Assistant

## 2022-09-11 ENCOUNTER — Ambulatory Visit (HOSPITAL_COMMUNITY)
Admission: RE | Admit: 2022-09-11 | Discharge: 2022-09-11 | Disposition: A | Discharge status: Home / Self Care | Payer: Medicare HMO | Source: Ambulatory Visit | Attending: Vascular Surgery | Admitting: Vascular Surgery

## 2022-09-11 VITALS — BP 161/80 | HR 89 | Temp 98.6°F | Resp 18 | Ht 71.0 in | Wt 155.0 lb

## 2022-09-11 DIAGNOSIS — I739 Peripheral vascular disease, unspecified: Secondary | ICD-10-CM | POA: Insufficient documentation

## 2022-09-11 DIAGNOSIS — I70299 Other atherosclerosis of native arteries of extremities, unspecified extremity: Secondary | ICD-10-CM | POA: Insufficient documentation

## 2022-09-11 DIAGNOSIS — L97909 Non-pressure chronic ulcer of unspecified part of unspecified lower leg with unspecified severity: Secondary | ICD-10-CM | POA: Diagnosis not present

## 2022-09-11 DIAGNOSIS — I70292 Other atherosclerosis of native arteries of extremities, left leg: Secondary | ICD-10-CM
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
## 2022-09-12 DIAGNOSIS — L89523 Pressure ulcer of left ankle, stage 3: Secondary | ICD-10-CM | POA: Diagnosis not present

## 2022-09-12 DIAGNOSIS — Z7902 Long term (current) use of antithrombotics/antiplatelets: Secondary | ICD-10-CM | POA: Diagnosis not present

## 2022-09-12 DIAGNOSIS — N319 Neuromuscular dysfunction of bladder, unspecified: Secondary | ICD-10-CM | POA: Diagnosis not present

## 2022-09-12 DIAGNOSIS — Z792 Long term (current) use of antibiotics: Secondary | ICD-10-CM | POA: Diagnosis not present

## 2022-09-12 DIAGNOSIS — G8929 Other chronic pain: Secondary | ICD-10-CM | POA: Diagnosis not present

## 2022-09-12 DIAGNOSIS — L89214 Pressure ulcer of right hip, stage 4: Secondary | ICD-10-CM | POA: Diagnosis not present

## 2022-09-12 DIAGNOSIS — G8220 Paraplegia, unspecified: Secondary | ICD-10-CM | POA: Diagnosis not present

## 2022-09-12 DIAGNOSIS — Z79899 Other long term (current) drug therapy: Secondary | ICD-10-CM | POA: Diagnosis not present

## 2022-09-12 DIAGNOSIS — L89153 Pressure ulcer of sacral region, stage 3: Secondary | ICD-10-CM | POA: Diagnosis not present

## 2022-09-12 DIAGNOSIS — L89224 Pressure ulcer of left hip, stage 4: Secondary | ICD-10-CM | POA: Diagnosis not present

## 2022-09-12 DIAGNOSIS — I739 Peripheral vascular disease, unspecified: Secondary | ICD-10-CM | POA: Diagnosis not present

## 2022-09-12 DIAGNOSIS — L89152 Pressure ulcer of sacral region, stage 2: Secondary | ICD-10-CM | POA: Diagnosis not present

## 2022-09-12 DIAGNOSIS — E7800 Pure hypercholesterolemia, unspecified: Secondary | ICD-10-CM | POA: Diagnosis not present

## 2022-09-12 DIAGNOSIS — Z720 Tobacco use: Secondary | ICD-10-CM | POA: Diagnosis not present

## 2022-09-12 DIAGNOSIS — K219 Gastro-esophageal reflux disease without esophagitis: Secondary | ICD-10-CM | POA: Diagnosis not present

## 2022-09-12 DIAGNOSIS — K592 Neurogenic bowel, not elsewhere classified: Secondary | ICD-10-CM | POA: Diagnosis not present

## 2022-09-12 DIAGNOSIS — I10 Essential (primary) hypertension: Secondary | ICD-10-CM | POA: Diagnosis not present

## 2022-09-18 ENCOUNTER — Other Ambulatory Visit: Payer: Self-pay | Admitting: Physical Medicine and Rehabilitation

## 2022-09-30 DIAGNOSIS — G8929 Other chronic pain: Secondary | ICD-10-CM | POA: Diagnosis not present

## 2022-09-30 DIAGNOSIS — I739 Peripheral vascular disease, unspecified: Secondary | ICD-10-CM | POA: Diagnosis not present

## 2022-09-30 DIAGNOSIS — L89224 Pressure ulcer of left hip, stage 4: Secondary | ICD-10-CM | POA: Diagnosis not present

## 2022-09-30 DIAGNOSIS — E7800 Pure hypercholesterolemia, unspecified: Secondary | ICD-10-CM | POA: Diagnosis not present

## 2022-09-30 DIAGNOSIS — L89152 Pressure ulcer of sacral region, stage 2: Secondary | ICD-10-CM | POA: Diagnosis not present

## 2022-09-30 DIAGNOSIS — Z792 Long term (current) use of antibiotics: Secondary | ICD-10-CM | POA: Diagnosis not present

## 2022-09-30 DIAGNOSIS — Z720 Tobacco use: Secondary | ICD-10-CM | POA: Diagnosis not present

## 2022-09-30 DIAGNOSIS — G8220 Paraplegia, unspecified: Secondary | ICD-10-CM | POA: Diagnosis not present

## 2022-09-30 DIAGNOSIS — N319 Neuromuscular dysfunction of bladder, unspecified: Secondary | ICD-10-CM | POA: Diagnosis not present

## 2022-09-30 DIAGNOSIS — I10 Essential (primary) hypertension: Secondary | ICD-10-CM | POA: Diagnosis not present

## 2022-09-30 DIAGNOSIS — L89154 Pressure ulcer of sacral region, stage 4: Secondary | ICD-10-CM | POA: Diagnosis not present

## 2022-09-30 DIAGNOSIS — L89523 Pressure ulcer of left ankle, stage 3: Secondary | ICD-10-CM | POA: Diagnosis not present

## 2022-09-30 DIAGNOSIS — R339 Retention of urine, unspecified: Secondary | ICD-10-CM | POA: Diagnosis not present

## 2022-09-30 DIAGNOSIS — K592 Neurogenic bowel, not elsewhere classified: Secondary | ICD-10-CM | POA: Diagnosis not present

## 2022-09-30 DIAGNOSIS — K219 Gastro-esophageal reflux disease without esophagitis: Secondary | ICD-10-CM | POA: Diagnosis not present

## 2022-09-30 DIAGNOSIS — Z79899 Other long term (current) drug therapy: Secondary | ICD-10-CM | POA: Diagnosis not present

## 2022-09-30 DIAGNOSIS — Z7902 Long term (current) use of antithrombotics/antiplatelets: Secondary | ICD-10-CM | POA: Diagnosis not present

## 2022-09-30 DIAGNOSIS — L89214 Pressure ulcer of right hip, stage 4: Secondary | ICD-10-CM | POA: Diagnosis not present

## 2022-10-01 DIAGNOSIS — R339 Retention of urine, unspecified: Secondary | ICD-10-CM | POA: Diagnosis not present

## 2022-10-08 ENCOUNTER — Other Ambulatory Visit: Payer: Self-pay | Admitting: Nurse Practitioner

## 2022-10-09 ENCOUNTER — Ambulatory Visit (INDEPENDENT_AMBULATORY_CARE_PROVIDER_SITE_OTHER): Payer: Medicare HMO | Admitting: Physician Assistant

## 2022-10-09 ENCOUNTER — Telehealth: Payer: Self-pay

## 2022-10-09 VITALS — BP 161/87 | HR 82 | Temp 98.7°F | Resp 18 | Ht 71.0 in | Wt 155.0 lb

## 2022-10-09 DIAGNOSIS — I70299 Other atherosclerosis of native arteries of extremities, unspecified extremity: Secondary | ICD-10-CM | POA: Diagnosis not present

## 2022-10-09 DIAGNOSIS — L97909 Non-pressure chronic ulcer of unspecified part of unspecified lower leg with unspecified severity: Secondary | ICD-10-CM

## 2022-10-09 DIAGNOSIS — I739 Peripheral vascular disease, unspecified: Secondary | ICD-10-CM

## 2022-10-09 LAB — AMB RESULTS CONSOLE CBG: Glucose: 130
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
## 2022-10-10 ENCOUNTER — Other Ambulatory Visit: Payer: Self-pay | Admitting: Nurse Practitioner

## 2022-10-10 ENCOUNTER — Ambulatory Visit (INDEPENDENT_AMBULATORY_CARE_PROVIDER_SITE_OTHER): Payer: Medicare HMO

## 2022-10-10 VITALS — Ht 71.0 in | Wt 150.0 lb

## 2022-10-10 DIAGNOSIS — K2101 Gastro-esophageal reflux disease with esophagitis, with bleeding: Secondary | ICD-10-CM

## 2022-10-10 DIAGNOSIS — E785 Hyperlipidemia, unspecified: Secondary | ICD-10-CM

## 2022-10-10 DIAGNOSIS — Z0000 Encounter for general adult medical examination without abnormal findings: Secondary | ICD-10-CM | POA: Diagnosis not present

## 2022-10-10 DIAGNOSIS — Z1211 Encounter for screening for malignant neoplasm of colon: Secondary | ICD-10-CM
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
## 2022-10-10 NOTE — Patient Instructions (Signed)
Dustin Medina , Thank you for taking time to come for your Medicare Wellness Visit. I appreciate your ongoing commitment to your health goals. Please review the following plan we discussed and let me know if I can assist you in the future.   Referrals/Orders/Follow-Ups/Clinician Recommendations: Aim for 30 minutes of exercise or brisk walking, 6-8 glasses of water, and 5 servings of fruits and vegetables each day.   This is a list of the screening recommended for you and due dates:  Health Maintenance  Topic Date Due   COVID-19 Vaccine (3 - 2023-24 season) 10/18/2021   Flu Shot  09/18/2022   Pneumonia Vaccine (1 of 2 - PCV) 06/27/2023*   DEXA scan (bone density measurement)  06/27/2023*   Zoster (Shingles) Vaccine (1 of 2) 06/29/2023*   Colon Cancer Screening  12/28/2023*   Medicare Annual Wellness Visit  10/10/2023   DTaP/Tdap/Td vaccine (3 - Td or Tdap) 04/22/2025   Hepatitis C Screening  Completed   HPV Vaccine  Aged Out  *Topic was postponed. The date shown is not the original due date.    Advanced directives: (Copy Requested) Please bring a copy of your health care power of attorney and living will to the office to be added to your chart at your convenience.  Next Medicare Annual Wellness Visit scheduled for next year: Yes  insert Preventive Care attachment Insert FALL PREVENTION attachment if needed

## 2022-10-22 ENCOUNTER — Ambulatory Visit (INDEPENDENT_AMBULATORY_CARE_PROVIDER_SITE_OTHER): Payer: Medicare HMO

## 2022-10-22 ENCOUNTER — Ambulatory Visit (INDEPENDENT_AMBULATORY_CARE_PROVIDER_SITE_OTHER): Payer: Medicare HMO | Admitting: Family Medicine

## 2022-10-22 ENCOUNTER — Encounter: Payer: Self-pay | Admitting: Family Medicine

## 2022-10-22 VITALS — BP 129/67 | HR 78 | Temp 97.7°F | Ht 71.0 in | Wt 155.0 lb

## 2022-10-22 DIAGNOSIS — S73005A Unspecified dislocation of left hip, initial encounter: Secondary | ICD-10-CM | POA: Diagnosis not present

## 2022-10-22 DIAGNOSIS — M25552 Pain in left hip: Secondary | ICD-10-CM | POA: Diagnosis not present

## 2022-10-22 DIAGNOSIS — M25252 Flail joint, left hip: Secondary | ICD-10-CM

## 2022-10-22 DIAGNOSIS — G8220 Paraplegia, unspecified: Secondary | ICD-10-CM

## 2022-10-22 NOTE — Progress Notes (Signed)
   Acute Office Visit  Subjective:     Patient ID: Dustin Medina, male    DOB: August 25, 1950, 72 y.o.   MRN: [id number]  Chief Complaint  Patient presents with   Numbness   Hip Pain    Hip Pain    Patient is in today for left hip abnormality. No known injury. Denies pain however his is paralyzed. He noticed that his left leg was dangling when transferring for the last 2 weeks. He has also noticed that his left leg is shorter than his right when using his parallel bars in his yard.   ROS As per HPI.      Objective:    BP 129/67   Pulse 78   Temp 97.7 F (36.5 C) (Temporal)   Ht 5\' 11"  (1.803 m)   Wt 155 lb (70.3 kg)   SpO2 96%   BMI 21.62 kg/m    Physical Exam Vitals and nursing note reviewed.  Constitutional:      General: He is not in acute distress.    Appearance: He is not ill-appearing, toxic-appearing or diaphoretic.  Pulmonary:     Effort: Pulmonary effort is normal. No respiratory distress.  Musculoskeletal:     Left hip: No lacerations or crepitus.     Left upper leg: No edema.     Right lower leg: No edema.     Left lower leg: No edema.     Comments: Laxity of left hip.   Skin:    General: Skin is warm and dry.  Neurological:     Mental Status: He is alert and oriented to person, place, and time. Mental status is at baseline.     Gait: Gait abnormal (arrives in wheelchair).  Psychiatric:        Mood and Affect: Mood normal.        Behavior: Behavior normal.     No results found for any visits on 10/22/22.      Assessment & Plan:   Hilery was seen today for numbness and hip pain.  Diagnoses and all orders for this visit:  Dislocation of left hip, initial encounter (HCC) -     DG HIP UNILAT W OR W/O PELVIS 2-3 VIEWS LEFT; Future -     Ambulatory referral to Orthopedic Surgery  Laxity of left hip -     DG HIP UNILAT W OR W/O PELVIS 2-3 VIEWS LEFT; Future -     Ambulatory referral to Orthopedic Surgery  Paraplegia [facility]) -     Ambulatory  referral to Orthopedic Surgery  Abnormal xray of left hip. Concern for dislocation vs dysplagia. Also ? Fracture. Start referral to ortho discussed with patient and ordered.   The patient indicates understanding of these issues and agrees with the plan.  Gabriel Earing, FNP

## 2022-10-28 ENCOUNTER — Other Ambulatory Visit: Payer: Self-pay

## 2022-10-28 ENCOUNTER — Encounter: Payer: Self-pay | Admitting: Physician Assistant

## 2022-10-28 ENCOUNTER — Ambulatory Visit (INDEPENDENT_AMBULATORY_CARE_PROVIDER_SITE_OTHER): Payer: Medicare HMO | Admitting: Physician Assistant

## 2022-10-28 DIAGNOSIS — I739 Peripheral vascular disease, unspecified: Secondary | ICD-10-CM

## 2022-10-28 DIAGNOSIS — M25552 Pain in left hip: Secondary | ICD-10-CM
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
## 2022-11-04 DIAGNOSIS — L89523 Pressure ulcer of left ankle, stage 3: Secondary | ICD-10-CM | POA: Diagnosis not present

## 2022-11-04 DIAGNOSIS — L97529 Non-pressure chronic ulcer of other part of left foot with unspecified severity: Secondary | ICD-10-CM | POA: Diagnosis not present

## 2022-11-04 DIAGNOSIS — I739 Peripheral vascular disease, unspecified: Secondary | ICD-10-CM | POA: Diagnosis not present

## 2022-11-04 DIAGNOSIS — G8220 Paraplegia, unspecified: Secondary | ICD-10-CM | POA: Diagnosis not present

## 2022-11-04 DIAGNOSIS — Z993 Dependence on wheelchair: Secondary | ICD-10-CM | POA: Diagnosis not present

## 2022-11-04 DIAGNOSIS — E7800 Pure hypercholesterolemia, unspecified: Secondary | ICD-10-CM | POA: Diagnosis not present

## 2022-11-04 DIAGNOSIS — L89159 Pressure ulcer of sacral region, unspecified stage: Secondary | ICD-10-CM | POA: Diagnosis not present

## 2022-11-04 DIAGNOSIS — I10 Essential (primary) hypertension: Secondary | ICD-10-CM | POA: Diagnosis not present

## 2022-11-04 DIAGNOSIS — Z79899 Other long term (current) drug therapy: Secondary | ICD-10-CM | POA: Diagnosis not present

## 2022-11-04 DIAGNOSIS — L97309 Non-pressure chronic ulcer of unspecified ankle with unspecified severity: Secondary | ICD-10-CM | POA: Diagnosis not present

## 2022-11-04 DIAGNOSIS — F17210 Nicotine dependence, cigarettes, uncomplicated: Secondary | ICD-10-CM | POA: Diagnosis not present

## 2022-11-04 DIAGNOSIS — L97519 Non-pressure chronic ulcer of other part of right foot with unspecified severity: Secondary | ICD-10-CM | POA: Diagnosis not present

## 2022-11-04 DIAGNOSIS — L89214 Pressure ulcer of right hip, stage 4: Secondary | ICD-10-CM | POA: Diagnosis not present

## 2022-11-04 DIAGNOSIS — L89152 Pressure ulcer of sacral region, stage 2: Secondary | ICD-10-CM | POA: Diagnosis not present
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
## 2022-11-10 ENCOUNTER — Ambulatory Visit: Payer: Medicare HMO | Admitting: Gastroenterology

## 2022-11-11 ENCOUNTER — Ambulatory Visit (INDEPENDENT_AMBULATORY_CARE_PROVIDER_SITE_OTHER): Payer: Medicare HMO | Admitting: Gastroenterology

## 2022-11-11 ENCOUNTER — Encounter: Payer: Self-pay | Admitting: Gastroenterology

## 2022-11-11 DIAGNOSIS — Z86010 Personal history of colonic polyps: Secondary | ICD-10-CM | POA: Diagnosis not present

## 2022-11-11 NOTE — Patient Instructions (Signed)
I will discuss timing of colonoscopy with Dr. Jena Gauss given your recent vascular stent placement and need for Plavix. Further recommendations to follow. Try adding Miralax one capful daily to help move your bowels more effectively. It can take a week or more to see full results. If stools become too loose, you can reduce to 1/2 capful daily. Please reach out if you have ongoing difficulty with bowel movements.
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
## 2022-11-13 ENCOUNTER — Ambulatory Visit (INDEPENDENT_AMBULATORY_CARE_PROVIDER_SITE_OTHER)
Admission: RE | Admit: 2022-11-13 | Discharge: 2022-11-13 | Disposition: A | Discharge status: Home / Self Care | Payer: Medicare HMO | Source: Ambulatory Visit | Attending: Vascular Surgery | Admitting: Vascular Surgery

## 2022-11-13 ENCOUNTER — Ambulatory Visit (HOSPITAL_COMMUNITY)
Admission: RE | Admit: 2022-11-13 | Discharge: 2022-11-13 | Disposition: A | Discharge status: Home / Self Care | Payer: Medicare HMO | Source: Ambulatory Visit | Attending: Vascular Surgery | Admitting: Vascular Surgery

## 2022-11-13 ENCOUNTER — Ambulatory Visit: Payer: Medicare HMO | Admitting: Physician Assistant

## 2022-11-13 VITALS — BP 139/75 | HR 89 | Temp 98.7°F | Resp 16 | Ht 71.0 in | Wt 150.0 lb

## 2022-11-13 DIAGNOSIS — I739 Peripheral vascular disease, unspecified: Secondary | ICD-10-CM

## 2022-11-13 DIAGNOSIS — L97909 Non-pressure chronic ulcer of unspecified part of unspecified lower leg with unspecified severity: Secondary | ICD-10-CM | POA: Diagnosis not present

## 2022-11-13 DIAGNOSIS — I70299 Other atherosclerosis of native arteries of extremities, unspecified extremity: Secondary | ICD-10-CM | POA: Diagnosis not present

## 2022-11-13 LAB — VAS US ABI WITH/WO TBI
Left ABI: 0.84
Right ABI: 0.97
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
## 2022-11-14 DIAGNOSIS — Z86010 Personal history of colonic polyps: Secondary | ICD-10-CM | POA: Insufficient documentation

## 2022-11-17 ENCOUNTER — Ambulatory Visit (INDEPENDENT_AMBULATORY_CARE_PROVIDER_SITE_OTHER): Payer: Medicare HMO | Admitting: Nurse Practitioner

## 2022-11-17 ENCOUNTER — Encounter: Payer: Self-pay | Admitting: Nurse Practitioner

## 2022-11-17 VITALS — BP 100/73 | HR 101 | Temp 97.1°F | Ht 71.0 in | Wt 150.0 lb

## 2022-11-17 DIAGNOSIS — N3000 Acute cystitis without hematuria: Secondary | ICD-10-CM

## 2022-11-17 DIAGNOSIS — R300 Dysuria: Secondary | ICD-10-CM

## 2022-11-17 LAB — URINALYSIS, ROUTINE W REFLEX MICROSCOPIC
Bilirubin, UA: NEGATIVE
Glucose, UA: NEGATIVE
Ketones, UA: NEGATIVE
Nitrite, UA: POSITIVE — AB
Specific Gravity, UA: 1.02 (ref 1.005–1.030)
Urobilinogen, Ur: 1 mg/dL (ref 0.2–1.0)
pH, UA: 8 — ABNORMAL HIGH (ref 5.0–7.5)

## 2022-11-17 LAB — MICROSCOPIC EXAMINATION
Renal Epithel, UA: NONE SEEN /[HPF]
WBC, UA: >30 /[HPF] — AB (ref 0–5)
Yeast, UA: NONE SEEN

## 2022-11-17 MED ORDER — DOXYCYCLINE HYCLATE 100 MG PO CAPS
100.0000 mg | ORAL_CAPSULE | Freq: Two times a day (BID) | ORAL | 0 refills | Status: DC
Start: 2022-11-17 — End: 2022-12-19
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
## 2022-11-18 LAB — URINE CULTURE

## 2022-11-25 ENCOUNTER — Other Ambulatory Visit: Payer: Self-pay

## 2022-11-25 DIAGNOSIS — I739 Peripheral vascular disease, unspecified: Secondary | ICD-10-CM

## 2022-11-25 DIAGNOSIS — L89523 Pressure ulcer of left ankle, stage 3: Secondary | ICD-10-CM | POA: Diagnosis not present

## 2022-11-25 DIAGNOSIS — I10 Essential (primary) hypertension: Secondary | ICD-10-CM | POA: Diagnosis not present

## 2022-11-25 DIAGNOSIS — L97523 Non-pressure chronic ulcer of other part of left foot with necrosis of muscle: Secondary | ICD-10-CM | POA: Diagnosis not present

## 2022-11-25 DIAGNOSIS — L89214 Pressure ulcer of right hip, stage 4: Secondary | ICD-10-CM | POA: Diagnosis not present

## 2022-11-25 DIAGNOSIS — L89524 Pressure ulcer of left ankle, stage 4: Secondary | ICD-10-CM | POA: Diagnosis not present

## 2022-11-25 DIAGNOSIS — G8220 Paraplegia, unspecified: Secondary | ICD-10-CM | POA: Diagnosis not present

## 2022-11-25 DIAGNOSIS — E7800 Pure hypercholesterolemia, unspecified: Secondary | ICD-10-CM | POA: Diagnosis not present

## 2022-11-25 DIAGNOSIS — M549 Dorsalgia, unspecified: Secondary | ICD-10-CM | POA: Diagnosis not present

## 2022-11-25 DIAGNOSIS — L97909 Non-pressure chronic ulcer of unspecified part of unspecified lower leg with unspecified severity: Secondary | ICD-10-CM

## 2022-11-25 DIAGNOSIS — L89224 Pressure ulcer of left hip, stage 4: Secondary | ICD-10-CM | POA: Diagnosis not present

## 2022-11-25 DIAGNOSIS — Z792 Long term (current) use of antibiotics: Secondary | ICD-10-CM | POA: Diagnosis not present

## 2022-11-25 DIAGNOSIS — L97519 Non-pressure chronic ulcer of other part of right foot with unspecified severity: Secondary | ICD-10-CM | POA: Diagnosis not present

## 2022-11-25 DIAGNOSIS — G8929 Other chronic pain: Secondary | ICD-10-CM | POA: Diagnosis not present

## 2022-11-25 DIAGNOSIS — Z7902 Long term (current) use of antithrombotics/antiplatelets: Secondary | ICD-10-CM | POA: Diagnosis not present

## 2022-11-25 DIAGNOSIS — L89152 Pressure ulcer of sacral region, stage 2: Secondary | ICD-10-CM | POA: Diagnosis not present

## 2022-11-26 ENCOUNTER — Encounter: Payer: Medicare HMO | Admitting: Physical Medicine and Rehabilitation

## 2022-12-02 ENCOUNTER — Telehealth: Payer: Self-pay | Admitting: *Deleted

## 2022-12-03 NOTE — Telephone Encounter (Signed)
error 

## 2022-12-08 ENCOUNTER — Telehealth: Payer: Self-pay | Admitting: Gastroenterology

## 2022-12-08 NOTE — Telephone Encounter (Signed)
Please inquire with vascular, last seen by PA [facility].   Patient is due for surveillance colonoscopy with history of adenomatous colon polyps. He had stenting 07/2022. We don't hold Plavix for colonoscopy but if he had post-procedure bleeding requiring Plavix to be held, we want to make sure he is far enough out from stenting to not be an issue for him.   Please find out if we need to wait for six months or 1 year after stenting to pursue routine colonoscopy, patient asymptomatic.

## 2022-12-09 NOTE — Telephone Encounter (Signed)
Spoke with Lawanna Kobus at [facility] about if needed to weight 6 months or 1 year prior to scheduling pt for colonoscopy. Pt states she will get with provider and call me back

## 2022-12-09 NOTE — Telephone Encounter (Signed)
Lawanna Kobus called back and states she spoke with provider. She says she was told that pt can go ahead and be scheduled do not have to wait 6 months or 1 year. Can fax over clearance if we needed something in writing

## 2022-12-12 ENCOUNTER — Telehealth: Payer: Self-pay | Admitting: Nurse Practitioner

## 2022-12-12 ENCOUNTER — Telehealth: Payer: Self-pay | Admitting: Orthopedic Surgery

## 2022-12-12 NOTE — Telephone Encounter (Signed)
Attempted to contact patient with no answer.

## 2022-12-12 NOTE — Telephone Encounter (Signed)
Please schedule patient for colonoscopy with Dr. Jena Gauss anytime 01/2023 or after.  ASA 3 Patient has nerve stimulator Recommend continue mirlax daily but five days before colonoscopy, increase to one capful bid.  Bisacodyl 10mg  daily starting 3 days before colonoscopy bowel prep starts. If not having good BMs the week before his colonoscopy, he needs to let us know.

## 2022-12-12 NOTE — Telephone Encounter (Signed)
Will need to be seen

## 2022-12-12 NOTE — Telephone Encounter (Signed)
Patient is wanting a 2nd opinion on his hip.  I told him we will need all the office notes from all the doctors he has seen and we need ALL XRAY, CT, AND MRI ON A CD with the notes, and any surgeries he has had and the office notes and surgery notes too.

## 2022-12-12 NOTE — Telephone Encounter (Signed)
Seen by Dois Davenport on 9/30. Please review and advise

## 2022-12-12 NOTE — Telephone Encounter (Signed)
  Incoming Patient Call  12/12/2022  What symptoms do you have? A little blood in urine   How long have you been sick? Noticed Blood in urine 3 days ago  Have you been seen for this problem? Yes on 09/30 it has been getting better but noticed blood in urine three days ago. Pt states he has lower back pain but his back always hurts. Pt has finished taking antibiotic that was prescribed  If your provider decides to give you a prescription, which pharmacy would you like for it to be sent to? [facility]   Patient informed that this information will be sent to the clinical staff for review and that they should receive a follow up call.

## 2022-12-16 NOTE — Telephone Encounter (Signed)
LMTRC

## 2022-12-18 NOTE — Telephone Encounter (Signed)
Appt made for 12/19/2022

## 2022-12-19 ENCOUNTER — Encounter: Payer: Self-pay | Admitting: Nurse Practitioner

## 2022-12-19 ENCOUNTER — Ambulatory Visit (INDEPENDENT_AMBULATORY_CARE_PROVIDER_SITE_OTHER): Payer: Medicare HMO | Admitting: Nurse Practitioner

## 2022-12-19 VITALS — BP 166/96 | HR 74 | Temp 97.4°F | Resp 20

## 2022-12-19 DIAGNOSIS — F5101 Primary insomnia: Secondary | ICD-10-CM

## 2022-12-19 DIAGNOSIS — R319 Hematuria, unspecified: Secondary | ICD-10-CM

## 2022-12-19 DIAGNOSIS — N3001 Acute cystitis with hematuria: Secondary | ICD-10-CM | POA: Diagnosis not present

## 2022-12-19 LAB — URINALYSIS, COMPLETE
Bilirubin, UA: NEGATIVE
Glucose, UA: NEGATIVE
Ketones, UA: NEGATIVE
Nitrite, UA: POSITIVE — AB
Protein,UA: NEGATIVE
Specific Gravity, UA: 1.02 (ref 1.005–1.030)
Urobilinogen, Ur: 1 mg/dL (ref 0.2–1.0)
pH, UA: 7 (ref 5.0–7.5)

## 2022-12-19 LAB — MICROSCOPIC EXAMINATION
Renal Epithel, UA: NONE SEEN /[HPF]
Yeast, UA: NONE SEEN

## 2022-12-19 MED ORDER — AMOXICILLIN-POT CLAVULANATE 875-125 MG PO TABS
1.0000 | ORAL_TABLET | Freq: Two times a day (BID) | ORAL | 0 refills | Status: DC
Start: 2022-12-19 — End: 2023-01-27

## 2022-12-19 MED ORDER — TRAZODONE HCL 50 MG PO TABS
25.0000 mg | ORAL_TABLET | Freq: Every evening | ORAL | 3 refills | Status: DC | PRN
Start: 2022-12-19 — End: 2023-01-12

## 2022-12-19 NOTE — Telephone Encounter (Signed)
Pt left vm returning call.   LMOVM to return call

## 2022-12-19 NOTE — Patient Instructions (Signed)
Insomnia Insomnia is a sleep disorder that makes it difficult to fall asleep or stay asleep. Insomnia can cause fatigue, low energy, difficulty concentrating, mood swings, and poor performance at work or school. There are three different ways to classify insomnia: Difficulty falling asleep. Difficulty staying asleep. Waking up too early in the morning. Any type of insomnia can be long-term (chronic) or short-term (acute). Both are common. Short-term insomnia usually lasts for 3 months or less. Chronic insomnia occurs at least three times a week for longer than 3 months. What are the causes? Insomnia may be caused by another condition, situation, or substance, such as: Having certain mental health conditions, such as anxiety and depression. Using caffeine, alcohol, tobacco, or drugs. Having gastrointestinal conditions, such as gastroesophageal reflux disease (GERD). Having certain medical conditions. These include: Asthma. Alzheimer's disease. Stroke. Chronic pain. An overactive thyroid gland (hyperthyroidism). Other sleep disorders, such as restless legs syndrome and sleep apnea. Menopause. Sometimes, the cause of insomnia may not be known. What increases the risk? Risk factors for insomnia include: Gender. Females are affected more often than males. Age. Insomnia is more common as people get older. Stress and certain medical and mental health conditions. Lack of exercise. Having an irregular work schedule. This may include working night shifts and traveling between different time zones. What are the signs or symptoms? If you have insomnia, the main symptom is having trouble falling asleep or having trouble staying asleep. This may lead to other symptoms, such as: Feeling tired or having low energy. Feeling nervous about going to sleep. Not feeling rested in the morning. Having trouble concentrating. Feeling irritable, anxious, or depressed. How is this diagnosed? This condition  may be diagnosed based on: Your symptoms and medical history. Your health care provider may ask about: Your sleep habits. Any medical conditions you have. Your mental health. A physical exam. How is this treated? Treatment for insomnia depends on the cause. Treatment may focus on treating an underlying condition that is causing the insomnia. Treatment may also include: Medicines to help you sleep. Counseling or therapy. Lifestyle adjustments to help you sleep better. Follow these instructions at home: Eating and drinking  Limit or avoid alcohol, caffeinated beverages, and products that contain nicotine and tobacco, especially close to bedtime. These can disrupt your sleep. Do not eat a large meal or eat spicy foods right before bedtime. This can lead to digestive discomfort that can make it hard for you to sleep. Sleep habits  Keep a sleep diary to help you and your health care provider figure out what could be causing your insomnia. Write down: When you sleep. When you wake up during the night. How well you sleep and how rested you feel the next day. Any side effects of medicines you are taking. What you eat and drink. Make your bedroom a dark, comfortable place where it is easy to fall asleep. Put up shades or blackout curtains to block light from outside. Use a white noise machine to block noise. Keep the temperature cool. Limit screen use before bedtime. This includes: Not watching TV. Not using your smartphone, tablet, or computer. Stick to a routine that includes going to bed and waking up at the same times every day and night. This can help you fall asleep faster. Consider making a quiet activity, such as reading, part of your nighttime routine. Try to avoid taking naps during the day so that you sleep better at night. Get out of bed if you are still awake after   15 minutes of trying to sleep. Keep the lights down, but try reading or doing a quiet activity. When you feel  sleepy, go back to bed. General instructions Take over-the-counter and prescription medicines only as told by your health care provider. Exercise regularly as told by your health care provider. However, avoid exercising in the hours right before bedtime. Use relaxation techniques to manage stress. Ask your health care provider to suggest some techniques that may work well for you. These may include: Breathing exercises. Routines to release muscle tension. Visualizing peaceful scenes. Make sure that you drive carefully. Do not drive if you feel very sleepy. Keep all follow-up visits. This is important. Contact a health care provider if: You are tired throughout the day. You have trouble in your daily routine due to sleepiness. You continue to have sleep problems, or your sleep problems get worse. Get help right away if: You have thoughts about hurting yourself or someone else. Get help right away if you feel like you may hurt yourself or others, or have thoughts about taking your own life. Go to your nearest emergency room or: Call 911. Call the National Suicide Prevention Lifeline at 1-800-273-8255 or 988. This is open 24 hours a day. Text the Crisis Text Line at 741741. Summary Insomnia is a sleep disorder that makes it difficult to fall asleep or stay asleep. Insomnia can be long-term (chronic) or short-term (acute). Treatment for insomnia depends on the cause. Treatment may focus on treating an underlying condition that is causing the insomnia. Keep a sleep diary to help you and your health care provider figure out what could be causing your insomnia. This information is not intended to replace advice given to you by your health care provider. Make sure you discuss any questions you have with your health care provider. Document Revised: 01/14/2021 Document Reviewed: 01/14/2021 Elsevier Patient Education  2024 Elsevier Inc.  

## 2022-12-19 NOTE — Progress Notes (Signed)
Subjective:    Patient ID: Dustin Medina, male    DOB: September 05, 1950, 72 y.o.   MRN: [id number]   Chief Complaint: Hematuria (Trouble sleeping/)   Patient comes in today with 2 complaints:  Hematuria This is a new problem. The current episode started 1 to 4 weeks ago. The problem has been waxing and waning since onset. He describes the hematuria as gross hematuria. Irritative symptoms include frequency and urgency. Obstructive symptoms do not include dribbling, incomplete emptying, a slower stream or a weak stream. Pertinent negatives include no chills, dysuria, fever, inability to urinate or urinary retention.  Insomnia Primary symptoms: fragmented sleep, difficulty falling asleep, frequent awakening.   The current episode started more than one month. The onset quality is gradual. The problem occurs intermittently. The problem has been waxing and waning since onset. How many beverages per day that contain caffeine: 2-3.  Types of beverages you drink: coffee and tea. Nothing relieves the symptoms. Past treatments include nothing. The treatment provided no relief. Typical bedtime:  10-11 P.M..  How long after going to bed to you fall asleep: 30-60 minutes.      Patient Active Problem List   Diagnosis Date Noted   H/O adenomatous polyp of colon 11/14/2022   Carpal tunnel syndrome of left wrist 11/11/2021   Chronic pain syndrome 11/11/2021   Thrombocytosis 09/02/2021   Pressure ulcer of trochanteric region of right hip, stage 3 (HCC) 03/04/2021   Pressure ulcer of left ankle, stage 3 (HCC) 05/22/2020   Spasticity 03/30/2020   Wheelchair dependence 12/23/2019   Stage III pressure ulcer of sacral region (HCC) 11/11/2019   Pain of both shoulder joints 11/11/2019   PVD (peripheral vascular disease) (HCC) 07/16/2018   De Quervain's tenosynovitis, right 03/03/2014   Right carpal tunnel syndrome 12/06/2013   T12 spinal cord injury (HCC) 12/06/2013   Neurogenic bladder 12/06/2013   Neurogenic  bowel 12/06/2013   Chronic back pain 10/10/2013   Low testosterone 07/06/2013   Paraplegic spinal paralysis (HCC) 07/24/2012   Paraplegia (HCC) 07/24/2012   Peptic stricture of esophagus 06/27/2010   Hyperlipidemia with target LDL less than 100 04/09/2009   Essential hypertension 04/09/2009   GERD 04/09/2009       Review of Systems  Constitutional:  Positive for fatigue. Negative for chills and fever.  Genitourinary:  Positive for frequency, hematuria and urgency. Negative for dysuria and incomplete emptying.  Psychiatric/Behavioral:  The patient has insomnia.        Objective:   Physical Exam Vitals and nursing note reviewed.  Constitutional:      Appearance: Normal appearance. He is well-developed.  Neck:     Thyroid: No thyroid mass or thyromegaly.     Vascular: No carotid bruit or JVD.     Trachea: Phonation normal.  Cardiovascular:     Rate and Rhythm: Normal rate and regular rhythm.  Pulmonary:     Effort: Pulmonary effort is normal. No respiratory distress.     Breath sounds: Normal breath sounds.  Abdominal:     General: Bowel sounds are normal.     Palpations: Abdomen is soft.     Tenderness: There is no abdominal tenderness. There is no right CVA tenderness or left CVA tenderness.  Musculoskeletal:        General: Normal range of motion.     Cervical back: Normal range of motion and neck supple.  Lymphadenopathy:     Cervical: No cervical adenopathy.  Skin:    General: Skin is warm and  dry.  Neurological:     Mental Status: He is alert and oriented to person, place, and time.  Psychiatric:        Behavior: Behavior normal.        Thought Content: Thought content normal.        Judgment: Judgment normal.    BP (!) 166/96   Pulse 74   Temp (!) 97.4 F (36.3 C) (Temporal)   Resp 20   SpO2 98%         Assessment & Plan:   Dustin Medina in today with chief complaint of Hematuria (Trouble sleeping/)   1. Hematuria, unspecified type -  Urinalysis, Complete - Urine Culture  2. Acute cystitis with hematuria Take medication as prescribe Cotton underwear Take shower not bath Cranberry juice, yogurt Force fluids AZO over the counter X2 days Culture pending RTO prn  - amoxicillin-clavulanate (AUGMENTIN) 875-125 MG tablet; Take 1 tablet by mouth 2 (two) times daily.  Dispense: 20 tablet; Refill: 0  3. Primary insomnia Bedtime routine - traZODone (DESYREL) 50 MG tablet; Take 0.5-1 tablets (25-50 mg total) by mouth at bedtime as needed for sleep.  Dispense: 30 tablet; Refill: 3    The above assessment and management plan was discussed with the patient. The patient verbalized understanding of and has agreed to the management plan. Patient is aware to call the clinic if symptoms persist or worsen. Patient is aware when to return to the clinic for a follow-up visit. Patient educated on when it is appropriate to go to the emergency department.   [name] Daphine Deutscher, FNP

## 2022-12-23 DIAGNOSIS — G5601 Carpal tunnel syndrome, right upper limb: Secondary | ICD-10-CM | POA: Diagnosis not present

## 2022-12-23 DIAGNOSIS — Z79899 Other long term (current) drug therapy: Secondary | ICD-10-CM | POA: Diagnosis not present

## 2022-12-23 DIAGNOSIS — K592 Neurogenic bowel, not elsewhere classified: Secondary | ICD-10-CM | POA: Diagnosis not present

## 2022-12-23 DIAGNOSIS — L89523 Pressure ulcer of left ankle, stage 3: Secondary | ICD-10-CM | POA: Diagnosis not present

## 2022-12-23 DIAGNOSIS — L89154 Pressure ulcer of sacral region, stage 4: Secondary | ICD-10-CM | POA: Diagnosis not present

## 2022-12-23 DIAGNOSIS — F17210 Nicotine dependence, cigarettes, uncomplicated: Secondary | ICD-10-CM | POA: Diagnosis not present

## 2022-12-23 DIAGNOSIS — L89152 Pressure ulcer of sacral region, stage 2: Secondary | ICD-10-CM | POA: Diagnosis not present

## 2022-12-23 DIAGNOSIS — Z993 Dependence on wheelchair: Secondary | ICD-10-CM | POA: Diagnosis not present

## 2022-12-23 DIAGNOSIS — G8220 Paraplegia, unspecified: Secondary | ICD-10-CM | POA: Diagnosis not present

## 2022-12-23 DIAGNOSIS — L97519 Non-pressure chronic ulcer of other part of right foot with unspecified severity: Secondary | ICD-10-CM | POA: Diagnosis not present

## 2022-12-23 DIAGNOSIS — L89214 Pressure ulcer of right hip, stage 4: Secondary | ICD-10-CM | POA: Diagnosis not present

## 2022-12-23 DIAGNOSIS — I10 Essential (primary) hypertension: Secondary | ICD-10-CM | POA: Diagnosis not present

## 2022-12-23 DIAGNOSIS — Z7902 Long term (current) use of antithrombotics/antiplatelets: Secondary | ICD-10-CM | POA: Diagnosis not present

## 2022-12-23 DIAGNOSIS — M549 Dorsalgia, unspecified: Secondary | ICD-10-CM | POA: Diagnosis not present

## 2022-12-23 DIAGNOSIS — K219 Gastro-esophageal reflux disease without esophagitis: Secondary | ICD-10-CM | POA: Diagnosis not present

## 2022-12-23 DIAGNOSIS — L89619 Pressure ulcer of right heel, unspecified stage: Secondary | ICD-10-CM | POA: Diagnosis not present

## 2022-12-23 DIAGNOSIS — I739 Peripheral vascular disease, unspecified: Secondary | ICD-10-CM | POA: Diagnosis not present

## 2022-12-23 DIAGNOSIS — E7800 Pure hypercholesterolemia, unspecified: Secondary | ICD-10-CM | POA: Diagnosis not present

## 2022-12-23 DIAGNOSIS — N319 Neuromuscular dysfunction of bladder, unspecified: Secondary | ICD-10-CM | POA: Diagnosis not present

## 2022-12-23 DIAGNOSIS — M654 Radial styloid tenosynovitis [de Quervain]: Secondary | ICD-10-CM | POA: Diagnosis not present

## 2022-12-23 DIAGNOSIS — L97529 Non-pressure chronic ulcer of other part of left foot with unspecified severity: Secondary | ICD-10-CM | POA: Diagnosis not present

## 2022-12-23 DIAGNOSIS — G8929 Other chronic pain: Secondary | ICD-10-CM | POA: Diagnosis not present

## 2022-12-23 LAB — URINE CULTURE

## 2022-12-23 MED ORDER — SULFAMETHOXAZOLE-TRIMETHOPRIM 800-160 MG PO TABS
1.0000 | ORAL_TABLET | Freq: Two times a day (BID) | ORAL | 0 refills | Status: DC
Start: 2022-12-23 — End: 2023-01-27

## 2022-12-23 NOTE — Addendum Note (Signed)
Addended by: Bennie Pierini on: 12/23/2022 04:37 PM   Modules accepted: Orders

## 2022-12-24 ENCOUNTER — Telehealth: Payer: Self-pay | Admitting: Internal Medicine

## 2022-12-24 NOTE — Telephone Encounter (Signed)
Dustin Medina A2 hours ago (10:46 AM)    Patient left a message saying he was trying to get in touch with [place].  I saw where you had called and left a message for him

## 2022-12-24 NOTE — Telephone Encounter (Signed)
Patient left a message saying he was trying to get in touch with [place].  I saw where you had called and left a message for him

## 2022-12-24 NOTE — Telephone Encounter (Signed)
LMTRC

## 2022-12-24 NOTE — Telephone Encounter (Signed)
See previous TE

## 2022-12-26 NOTE — Telephone Encounter (Signed)
Pt has been scheduled for 02/04/23. Instructions mailed and prep sent to the pharmacy

## 2022-12-29 ENCOUNTER — Encounter: Payer: Self-pay | Admitting: *Deleted

## 2022-12-29 ENCOUNTER — Other Ambulatory Visit: Payer: Self-pay | Admitting: *Deleted

## 2022-12-29 MED ORDER — PEG 3350-KCL-NA BICARB-NACL 420 G PO SOLR: 4000.0000 mL | Freq: Once | ORAL | 0 refills | Status: AC

## 2023-01-02 ENCOUNTER — Other Ambulatory Visit: Payer: Self-pay | Admitting: Nurse Practitioner

## 2023-01-02 DIAGNOSIS — E785 Hyperlipidemia, unspecified: Secondary | ICD-10-CM

## 2023-01-10 ENCOUNTER — Other Ambulatory Visit: Payer: Self-pay | Admitting: Nurse Practitioner

## 2023-01-10 DIAGNOSIS — F5101 Primary insomnia: Secondary | ICD-10-CM

## 2023-01-13 DIAGNOSIS — L89523 Pressure ulcer of left ankle, stage 3: Secondary | ICD-10-CM | POA: Diagnosis not present

## 2023-01-13 DIAGNOSIS — Y92009 Unspecified place in unspecified non-institutional (private) residence as the place of occurrence of the external cause: Secondary | ICD-10-CM | POA: Diagnosis not present

## 2023-01-13 DIAGNOSIS — L89152 Pressure ulcer of sacral region, stage 2: Secondary | ICD-10-CM | POA: Diagnosis not present

## 2023-01-13 DIAGNOSIS — W19XXXD Unspecified fall, subsequent encounter: Secondary | ICD-10-CM | POA: Diagnosis not present

## 2023-01-13 DIAGNOSIS — L89214 Pressure ulcer of right hip, stage 4: Secondary | ICD-10-CM | POA: Diagnosis not present

## 2023-01-21 ENCOUNTER — Telehealth: Payer: Self-pay | Admitting: Physical Medicine and Rehabilitation

## 2023-01-21 NOTE — Telephone Encounter (Signed)
Patient called in states he needs help obtaining a new cushion for his chair states something broke off and needs a replacement

## 2023-01-23 NOTE — Telephone Encounter (Signed)
Rx sent to [facility].

## 2023-01-23 NOTE — Telephone Encounter (Signed)
Attempted to call pt- was unable to get on phone x2- will write out Rx for new w/c cushion vs repair- and send to [facility]-  since pt always loses Rx's!  It' snot clear what's going on with w/c cushion so let [facility] know that- thanks ML

## 2023-01-27 ENCOUNTER — Encounter: Payer: Self-pay | Admitting: Nurse Practitioner

## 2023-01-27 ENCOUNTER — Ambulatory Visit: Payer: Medicare HMO | Admitting: Nurse Practitioner

## 2023-01-27 VITALS — BP 163/89 | HR 92 | Temp 98.0°F | Resp 20

## 2023-01-27 DIAGNOSIS — G8220 Paraplegia, unspecified: Secondary | ICD-10-CM

## 2023-01-27 DIAGNOSIS — E785 Hyperlipidemia, unspecified: Secondary | ICD-10-CM | POA: Diagnosis not present

## 2023-01-27 DIAGNOSIS — K219 Gastro-esophageal reflux disease without esophagitis: Secondary | ICD-10-CM | POA: Diagnosis not present

## 2023-01-27 DIAGNOSIS — R252 Cramp and spasm: Secondary | ICD-10-CM | POA: Diagnosis not present

## 2023-01-27 DIAGNOSIS — L89153 Pressure ulcer of sacral region, stage 3: Secondary | ICD-10-CM

## 2023-01-27 DIAGNOSIS — Z87440 Personal history of urinary (tract) infections: Secondary | ICD-10-CM

## 2023-01-27 DIAGNOSIS — I739 Peripheral vascular disease, unspecified: Secondary | ICD-10-CM | POA: Diagnosis not present

## 2023-01-27 DIAGNOSIS — K2101 Gastro-esophageal reflux disease with esophagitis, with bleeding: Secondary | ICD-10-CM | POA: Diagnosis not present

## 2023-01-27 DIAGNOSIS — F5101 Primary insomnia: Secondary | ICD-10-CM | POA: Diagnosis not present

## 2023-01-27 DIAGNOSIS — Z993 Dependence on wheelchair: Secondary | ICD-10-CM

## 2023-01-27 DIAGNOSIS — I10 Essential (primary) hypertension: Secondary | ICD-10-CM

## 2023-01-27 DIAGNOSIS — R5383 Other fatigue: Secondary | ICD-10-CM | POA: Diagnosis not present

## 2023-01-27 LAB — URINALYSIS, COMPLETE
Bilirubin, UA: NEGATIVE
Glucose, UA: NEGATIVE
Ketones, UA: NEGATIVE
Nitrite, UA: POSITIVE — AB
Specific Gravity, UA: 1.025 (ref 1.005–1.030)
Urobilinogen, Ur: 0.2 mg/dL (ref 0.2–1.0)
pH, UA: 7 (ref 5.0–7.5)

## 2023-01-27 LAB — MICROSCOPIC EXAMINATION: WBC, UA: >30 /[HPF] — ABNORMAL HIGH (ref 0–5)

## 2023-01-27 LAB — LIPID PANEL

## 2023-01-27 MED ORDER — TRAZODONE HCL 50 MG PO TABS: 50.0000 mg | ORAL_TABLET | Freq: Every evening | ORAL | 1 refills | Status: DC | PRN

## 2023-01-27 MED ORDER — PANTOPRAZOLE SODIUM 40 MG PO TBEC: 40.0000 mg | DELAYED_RELEASE_TABLET | Freq: Every day | ORAL | 1 refills | Status: AC

## 2023-01-27 MED ORDER — CLOPIDOGREL BISULFATE 75 MG PO TABS: 75.0000 mg | ORAL_TABLET | Freq: Every day | ORAL | 1 refills | Status: DC

## 2023-01-27 MED ORDER — ATORVASTATIN CALCIUM 40 MG PO TABS: 40.0000 mg | ORAL_TABLET | Freq: Every day | ORAL | 1 refills | Status: DC

## 2023-01-27 MED ORDER — SULFAMETHOXAZOLE-TRIMETHOPRIM 800-160 MG PO TABS: 1.0000 | ORAL_TABLET | Freq: Two times a day (BID) | ORAL | 0 refills | Status: DC

## 2023-01-27 MED ORDER — LOSARTAN POTASSIUM 100 MG PO TABS: 100.0000 mg | ORAL_TABLET | Freq: Every day | ORAL | 1 refills | Status: DC

## 2023-01-27 MED ORDER — NIACIN ER (ANTIHYPERLIPIDEMIC) 1000 MG PO TBCR: 1000.0000 mg | EXTENDED_RELEASE_TABLET | Freq: Every day | ORAL | 1 refills | Status: DC

## 2023-01-27 MED ORDER — AMLODIPINE BESYLATE 10 MG PO TABS: 10.0000 mg | ORAL_TABLET | Freq: Every day | ORAL | 1 refills | Status: DC

## 2023-01-27 NOTE — Progress Notes (Addendum)
Subjective:    Patient ID: Dustin Medina, male    DOB: [birth year], 72 y.o.   MRN: [id number]   Chief Complaint: medical management of chronic issues     HPI:  Dustin Medina is a 72 y.o. who identifies as a male who was assigned male at birth.   Social history: Lives with: by himself Work history: disability   Comes in today for follow up of the following chronic medical issues:  1. Essential hypertension No c/o chest pain, sob or headache. Does not check blood pressure at home BP Readings from Last 3 Encounters:  12/19/22 (!) 166/96  11/17/22 100/73  11/13/22 139/75     2. PVD (peripheral vascular disease) (HCC) Mainly in lower ext. His toes stay cold and sometimes discolorder.  3. Hyperlipidemia with target LDL less than 100 He does try to watch diet but does not do a lot of exercise. Lab Results  Component Value Date   CHOL 160 06/27/2022   HDL 45 06/27/2022   LDLCALC 98 06/27/2022   TRIG 92 06/27/2022   CHOLHDL 3.6 06/27/2022     4. Gastroesophageal reflux disease, unspecified whether esophagitis present Is on protonix and is doing well.  5. Paraplegia (HCC) 6. Paraplegic spinal paralysis (HCC) 7. Spasticity 8. Stage III pressure ulcer of sacral region [facility]) Has had recent pressure ulcer on sacral area that are currently healed. He still has lesion on hip and ankle that he is seeing wound care for.  9. Wheelchair dependence He does not stand anymore. He spends most of his waking hours in his wheel chair.    New complaints: Still having UTI symptoms. Was treated with augmentin and bactrim at first of november  No Known Allergies Outpatient Encounter Medications as of 01/27/2023  Medication Sig   amLODipine (NORVASC) 10 MG tablet Take 1 tablet (10 mg total) by mouth daily. (NEEDS TO BE SEEN BEFORE NEXT REFILL)   amoxicillin-clavulanate (AUGMENTIN) 875-125 MG tablet Take 1 tablet by mouth 2 (two) times daily.   atorvastatin (LIPITOR) 40 MG tablet  TAKE 1 TABLET BY MOUTH EVERY DAY   baclofen (LIORESAL) 10 MG tablet TAKE 0.5-1 TABLETS BY MOUTH 3 TIMES DAILY AS NEEDED FOR MUSCLE SPASMS.   clopidogrel (PLAVIX) 75 MG tablet Take 1 tablet (75 mg total) by mouth daily.   cyclobenzaprine (FLEXERIL) 10 MG tablet Take 10 mg by mouth daily as needed for muscle spasms.   ibuprofen (ADVIL) 200 MG tablet Take 200 mg by mouth every 6 (six) hours as needed for headache or moderate pain.   losartan (COZAAR) 100 MG tablet Take 1 tablet (100 mg total) by mouth daily.   Nerve Stimulator (PRO COMFORT TENS ELECTRODES) MISC Apply 2 patches topically daily as needed.   niacin (NIASPAN) 1000 MG CR tablet Take 1 tablet (1,000 mg total) by mouth at bedtime. (NEEDS TO BE SEEN BEFORE NEXT REFILL)   pantoprazole (PROTONIX) 40 MG tablet Take 1 tablet (40 mg total) by mouth daily. (NEEDS TO BE SEEN BEFORE NEXT REFILL)   promethazine (PHENERGAN) 25 MG tablet Take 1 tablet (25 mg total) by mouth every 8 (eight) hours as needed for nausea or vomiting.   silver sulfADIAZINE (SILVADENE) 1 % cream Apply 1 Application topically daily as needed (wound care).   sulfamethoxazole-trimethoprim (BACTRIM DS) 800-160 MG tablet Take 1 tablet by mouth 2 (two) times daily.   testosterone cypionate (DEPOTESTOSTERONE CYPIONATE) 200 MG/ML injection Inject 0.75 mLs (150 mg total) into the muscle every 14 (fourteen) days.  traMADol (ULTRAM) 50 MG tablet Take 1 tablet (50 mg total) by mouth every 6 (six) hours as needed for severe pain.   traZODone (DESYREL) 50 MG tablet TAKE 0.5-1 TABLETS BY MOUTH AT BEDTIME AS NEEDED FOR SLEEP.   trimethoprim (TRIMPEX) 100 MG tablet TAKE 1 TABLET BY MOUTH EVERY DAY   vitamin C (ASCORBIC ACID) 500 MG tablet Take 500 mg by mouth daily.   zinc gluconate 50 MG tablet Take 50 mg by mouth daily.   No facility-administered encounter medications on file as of 01/27/2023.    Past Surgical History:  Procedure Laterality Date   ABDOMINAL AORTOGRAM W/LOWER EXTREMITY  Bilateral 11/19/2018   Procedure: ABDOMINAL AORTOGRAM W/LOWER EXTREMITY;  Surgeon: Chuck Hint, MD;  Location: [facility] INVASIVE CV LAB;  Service: Cardiovascular;  Laterality: Bilateral;   ABDOMINAL AORTOGRAM W/LOWER EXTREMITY Left 01/18/2021   Procedure: ABDOMINAL AORTOGRAM W/LOWER EXTREMITY;  Surgeon: Chuck Hint, MD;  Location: [facility] INVASIVE CV LAB;  Service: Cardiovascular;  Laterality: Left;   ABDOMINAL AORTOGRAM W/LOWER EXTREMITY N/A 08/04/2022   Procedure: ABDOMINAL AORTOGRAM W/LOWER EXTREMITY;  Surgeon: Chuck Hint, MD;  Location: [facility] INVASIVE CV LAB;  Service: Cardiovascular;  Laterality: N/A;   BACK SURGERY  1969   carpal tunnel Right 11/23/13   coloncoscopy  2008   Dr. Karilyn Cota: few small diverticula at ascending colon and external hemorrhoids, otherwise normal   COLONOSCOPY WITH PROPOFOL N/A 04/16/2017   Procedure: COLONOSCOPY WITH PROPOFOL;  Surgeon: Corbin Ade, MD;  Location: [facility];  Service: Endoscopy;  Laterality: N/A;  11:15am   dilation of esophagus     ESOPHAGOGASTRODUODENOSCOPY N/A 07/26/2012   [id number] Schatzki's ring. Hiatal hernia, likely upper GI bleed secondary to MW tear   HERNIA REPAIR  02/03/2001   paraplegia and right inguinal hernia   left leg surgery due to staph infection  2005   LIPOMA EXCISION  07/07/2011   Procedure: EXCISION LIPOMA;  Surgeon: Wilmon Arms. [name], MD;  Location: [facility];  Service: General;  Laterality: Right;   LOWER EXTREMITY ANGIOGRAPHY N/A 07/16/2018   Procedure: LOWER EXTREMITY ANGIOGRAPHY;  Surgeon: Chuck Hint, MD;  Location: [facility] INVASIVE CV LAB;  Service: Cardiovascular;  Laterality: N/A;   MULTIPLE TOOTH EXTRACTIONS     PERIPHERAL VASCULAR INTERVENTION  07/16/2018   Procedure: PERIPHERAL VASCULAR INTERVENTION;  Surgeon: Chuck Hint, MD;  Location: [facility] INVASIVE CV LAB;  Service: Cardiovascular;;  right common iliac   PERIPHERAL VASCULAR INTERVENTION Left 01/18/2021   Procedure:  PERIPHERAL VASCULAR INTERVENTION;  Surgeon: Chuck Hint, MD;  Location: [facility] INVASIVE CV LAB;  Service: Cardiovascular;  Laterality: Left;  SFA / common iliac   PERIPHERAL VASCULAR INTERVENTION Left 08/04/2022   Procedure: PERIPHERAL VASCULAR INTERVENTION;  Surgeon: Chuck Hint, MD;  Location: [facility] INVASIVE CV LAB;  Service: Cardiovascular;  Laterality: Left;  Common Illiac   POLYPECTOMY  04/16/2017   Procedure: POLYPECTOMY;  Surgeon: Corbin Ade, MD;  Location: [facility];  Service: Endoscopy;;  colon   SURGERY FOR DECUBITUS ULCER      Family History  Problem Relation Age of Onset   COPD Father    Heart disease Father    Aneurysm Father    Hyperlipidemia Mother    Hypertension Mother    Diabetes Sister    Arrhythmia Daughter    Stroke Sister    Colon cancer Maternal Grandfather    Esophageal cancer Neg Hx       Controlled substance contract: n/a     Review of  Systems  Constitutional:  Negative for diaphoresis.  Eyes:  Negative for pain.  Respiratory:  Negative for shortness of breath.   Cardiovascular:  Negative for chest pain, palpitations and leg swelling.  Gastrointestinal:  Negative for abdominal pain.  Endocrine: Negative for polydipsia.  Skin:  Negative for rash.  Neurological:  Negative for dizziness, weakness and headaches.  Hematological:  Does not bruise/bleed easily.  All other systems reviewed and are negative.      Objective:   Physical Exam Vitals and nursing note reviewed.  Constitutional:      Appearance: Normal appearance. He is well-developed.  HENT:     Head: Normocephalic.     Nose: Nose normal.     Mouth/Throat:     Mouth: Mucous membranes are moist.     Pharynx: Oropharynx is clear.  Eyes:     Pupils: Pupils are equal, round, and reactive to light.  Neck:     Thyroid: No thyroid mass or thyromegaly.     Vascular: No carotid bruit or JVD.     Trachea: Phonation normal.  Cardiovascular:     Rate and Rhythm:  Normal rate and regular rhythm.  Pulmonary:     Effort: Pulmonary effort is normal. No respiratory distress.     Breath sounds: Normal breath sounds.  Abdominal:     General: Bowel sounds are normal.     Palpations: Abdomen is soft.     Tenderness: There is no abdominal tenderness.  Musculoskeletal:        General: Normal range of motion.     Cervical back: Normal range of motion and neck supple.     Left lower leg: Edema (1+) present.     Comments: Sitting in wheel chair Not able to move lower ext at all.  Lymphadenopathy:     Cervical: No cervical adenopathy.  Skin:    General: Skin is warm and dry.  Neurological:     Mental Status: He is alert and oriented to person, place, and time.  Psychiatric:        Behavior: Behavior normal.        Thought Content: Thought content normal.        Judgment: Judgment normal.      BP (!) 163/89   Pulse 92   Temp 98 F (36.7 C) (Temporal)   Resp 20   SpO2 96%       Assessment & Plan:   Dustin Medina comes in today with chief complaint of Medical Management of Chronic Issues   Diagnosis and orders addressed:  1. Essential hypertension Low sodium diet - amLODipine (NORVASC) 10 MG tablet; Take 1 tablet (10 mg total) by mouth daily. (NEEDS TO BE SEEN BEFORE NEXT REFILL)  Dispense: 90 tablet; Refill: 1 - losartan (COZAAR) 100 MG tablet; Take 1 tablet (100 mg total) by mouth daily.  Dispense: 90 tablet; Refill: 1  2. PVD (peripheral vascular disease) (HCC) - clopidogrel (PLAVIX) 75 MG tablet; Take 1 tablet (75 mg total) by mouth daily.  Dispense: 90 tablet; Refill: 1  3. Hyperlipidemia with target LDL less than 100 Low fat diet - atorvastatin (LIPITOR) 40 MG tablet; Take 1 tablet (40 mg total) by mouth daily.  Dispense: 90 tablet; Refill: 1 - niacin (NIASPAN) 1000 MG CR tablet; Take 1 tablet (1,000 mg total) by mouth at bedtime. (NEEDS TO BE SEEN BEFORE NEXT REFILL)  Dispense: 90 tablet; Refill: 1  4. Gastroesophageal reflux  disease, unspecified whether esophagitis present Avoid spicy foods Do not  eat 2 hours prior to bedtime   5. Paraplegia (HCC) 6. Paraplegic spinal paralysis (HCC) 7. Spasticity 8. Stage III pressure ulcer of sacral region [facility]) Change positions frequently Keep follow up with wound care  9. Wheelchair dependence Elevate legs when can  10. Hx: UTI (urinary tract infection) Will run urine culture - Urinalysis, Complete - Urine Culture  11. Gastroesophageal reflux disease with esophagitis and hemorrhage Avoid spicy foods Do not eat 2 hours prior to bedtime - pantoprazole (PROTONIX) 40 MG tablet; Take 1 tablet (40 mg total) by mouth daily. (NEEDS TO BE SEEN BEFORE NEXT REFILL)  Dispense: 90 tablet; Refill: 1  12. Primary insomnia Bedtime routine - traZODone (DESYREL) 50 MG tablet; Take 1 tablet (50 mg total) by mouth at bedtime as needed for sleep.  Dispense: 90 tablet; Refill: 1   Labs pending Health Maintenance reviewed Diet and exercise encouraged  Follow up plan: 6 months   [name] Daphine Deutscher, FNP   Addendum: Acute cystits- bactrim DS bid for 10 days- if culture comes back resistant will have to go to the hospital for treatment.  [name] Daphine Deutscher, FNP

## 2023-01-27 NOTE — Addendum Note (Signed)
Addended by: Bennie Pierini on: 01/27/2023 10:04 AM   Modules accepted: Orders

## 2023-01-27 NOTE — Patient Instructions (Signed)
Exercises to do While Sitting  Exercises that you do while sitting (chair exercises) can give you many of the same benefits as full exercise. Benefits include strengthening your heart, burning calories, and keeping muscles and joints healthy. Exercise can also improve your mood and help with depression and anxiety. You may benefit from chair exercises if you are unable to do standing exercises due to: Diabetic foot pain. Obesity. Illness. Arthritis. Recovery from surgery or injury. Breathing problems. Balance problems. Another type of disability. Before starting chair exercises, check with your health care provider or a physical therapist to find out how much exercise you can tolerate and which exercises are safe for you. If your health care provider approves: Start out slowly and build up over time. Aim to work up to about 10-20 minutes for each exercise session. Make exercise part of your daily routine. Drink water when you exercise. Do not wait until you are thirsty. Drink every 10-15 minutes. Stop exercising right away if you have pain, nausea, shortness of breath, or dizziness. If you are exercising in a wheelchair, make sure to lock the wheels. Ask your health care provider whether you can do tai chi or yoga. Many positions in these mind-body exercises can be modified to do while seated. Warm-up Before starting other exercises: Sit up as straight as you can. Have your knees bent at 90 degrees, which is the shape of the capital letter "L." Keep your feet flat on the floor. Sit at the front edge of your chair, if you can. Pull in (tighten) the muscles in your abdomen and stretch your spine and neck as straight as you can. Hold this position for a few minutes. Breathe in and out evenly. Try to concentrate on your breathing, and relax your mind. Stretching Exercise A: Arm stretch Hold your arms out straight in front of your body. Bend your hands at the wrist with your fingers pointing  up, as if signaling someone to stop. Notice the slight tension in your forearms as you hold the position. Keeping your arms out and your hands bent, rotate your hands outward as far as you can and hold this stretch. Aim to have your thumbs pointing up and your pinkie fingers pointing down. Slowly repeat arm stretches for one minute as tolerated. Exercise B: Leg stretch If you can move your legs, try to "draw" letters on the floor with the toes of your foot. Write your name with one foot. Write your name with the toes of your other foot. Slowly repeat the movements for one minute as tolerated. Exercise C: Reach for the sky Reach your hands as far over your head as you can to stretch your spine. Move your hands and arms as if you are climbing a rope. Slowly repeat the movements for one minute as tolerated. Range of motion exercises Exercise A: Shoulder roll Let your arms hang loosely at your sides. Lift just your shoulders up toward your ears, then let them relax back down. When your shoulders feel loose, rotate your shoulders in backward and forward circles. Do shoulder rolls slowly for one minute as tolerated. Exercise B: March in place As if you are marching, pump your arms and lift your legs up and down. Lift your knees as high as you can. If you are unable to lift your knees, just pump your arms and move your ankles and feet up and down. March in place for one minute as tolerated. Exercise C: Seated jumping jacks Let your arms hang  down straight. Keeping your arms straight, lift them up over your head. Aim to point your fingers to the ceiling. While you lift your arms, straighten your legs and slide your heels along the floor to your sides, as wide as you can. As you bring your arms back down to your sides, slide your legs back together. If you are unable to use your legs, just move your arms. Slowly repeat seated jumping jacks for one minute as tolerated. Strengthening  exercises Exercise A: Shoulder squeeze Hold your arms straight out from your body to your sides, with your elbows bent and your fists pointed at the ceiling. Keeping your arms in the bent position, move them forward so your elbows and forearms meet in front of your face. Open your arms back out as wide as you can with your elbows still bent, until you feel your shoulder blades squeezing together. Hold for 5 seconds. Slowly repeat the movements forward and backward for one minute as tolerated. Contact a health care provider if: You have to stop exercising due to any of the following: Pain. Nausea. Shortness of breath. Dizziness. Fatigue. You have significant pain or soreness after exercising. Get help right away if: You have chest pain. You have difficulty breathing. These symptoms may represent a serious problem that is an emergency. Do not wait to see if the symptoms will go away. Get medical help right away. Call your local emergency services (911 in the [place] Do not drive yourself to the hospital. Summary Exercises that you do while sitting (chair exercises) can strengthen your heart, burn calories, and keep muscles and joints healthy. You may benefit from chair exercises if you are unable to do standing exercises due to diabetic foot pain, obesity, recovery from surgery or injury, or other conditions. Before starting chair exercises, check with your health care provider or a physical therapist to find out how much exercise you can tolerate and which exercises are safe for you. This information is not intended to replace advice given to you by your health care provider. Make sure you discuss any questions you have with your health care provider. Document Revised: 04/01/2020 Document Reviewed: 04/01/2020 Elsevier Patient Education  2024 [employer].

## 2023-01-27 NOTE — Addendum Note (Signed)
Addended by: Bennie Pierini on: 01/27/2023 10:45 AM   Modules accepted: Orders

## 2023-01-27 NOTE — Addendum Note (Signed)
Addended by: Cleda Daub on: 01/27/2023 10:05 AM   Modules accepted: Orders

## 2023-01-28 LAB — CMP14+EGFR
ALT: 12 [IU]/L (ref 0–44)
AST: 16 [IU]/L (ref 0–40)
Albumin: 3.9 g/dL (ref 3.8–4.8)
Alkaline Phosphatase: 104 [IU]/L (ref 44–121)
BUN/Creatinine Ratio: 25 — ABNORMAL HIGH (ref 10–24)
BUN: 16 mg/dL (ref 8–27)
Bilirubin Total: 0.4 mg/dL (ref 0.0–1.2)
CO2: 24 mmol/L (ref 20–29)
Calcium: 9.4 mg/dL (ref 8.6–10.2)
Chloride: 103 mmol/L (ref 96–106)
Creatinine, Ser: 0.63 mg/dL — ABNORMAL LOW (ref 0.76–1.27)
Globulin, Total: 3 g/dL (ref 1.5–4.5)
Glucose: 76 mg/dL (ref 70–99)
Potassium: 3.9 mmol/L (ref 3.5–5.2)
Sodium: 143 mmol/L (ref 134–144)
Total Protein: 6.9 g/dL (ref 6.0–8.5)
eGFR: 102 mL/min/{1.73_m2} (ref >59)

## 2023-01-28 LAB — CBC WITH DIFFERENTIAL/PLATELET
Basophils Absolute: 0.1 10*3/uL (ref 0.0–0.2)
Basos: 1 %
EOS (ABSOLUTE): 0.1 10*3/uL (ref 0.0–0.4)
Eos: 1 %
Hematocrit: 46.9 % (ref 37.5–51.0)
Hemoglobin: 14.5 g/dL (ref 13.0–17.7)
Immature Grans (Abs): 0 10*3/uL (ref 0.0–0.1)
Immature Granulocytes: 0 %
Lymphocytes Absolute: 3.3 10*3/uL — ABNORMAL HIGH (ref 0.7–3.1)
Lymphs: 22 %
MCH: 26.8 pg (ref 26.6–33.0)
MCHC: 30.9 g/dL — ABNORMAL LOW (ref 31.5–35.7)
MCV: 87 fL (ref 79–97)
Monocytes Absolute: 1.1 10*3/uL — ABNORMAL HIGH (ref 0.1–0.9)
Monocytes: 7 %
Neutrophils Absolute: 10.7 10*3/uL — ABNORMAL HIGH (ref 1.4–7.0)
Neutrophils: 69 %
Platelets: 310 10*3/uL (ref 150–450)
RBC: 5.42 x10E6/uL (ref 4.14–5.80)
RDW: 17 % — ABNORMAL HIGH (ref 11.6–15.4)
WBC: 15.4 10*3/uL — ABNORMAL HIGH (ref 3.4–10.8)

## 2023-01-28 LAB — LIPID PANEL
Cholesterol, Total: 196 mg/dL (ref 100–199)
HDL: 42 mg/dL (ref >39)
LDL CALC COMMENT:: 4.7 ratio (ref 0.0–5.0)
LDL Chol Calc (NIH): 131 mg/dL — ABNORMAL HIGH (ref 0–99)
Triglycerides: 125 mg/dL (ref 0–149)
VLDL Cholesterol Cal: 23 mg/dL (ref 5–40)

## 2023-01-28 LAB — VITAMIN B12: Vitamin B-12: 704 pg/mL (ref 232–1245)

## 2023-01-30 ENCOUNTER — Encounter: Payer: Medicare HMO | Attending: Physical Medicine and Rehabilitation | Admitting: Physical Medicine and Rehabilitation

## 2023-01-30 ENCOUNTER — Encounter: Payer: Self-pay | Admitting: Physical Medicine and Rehabilitation

## 2023-01-30 VITALS — BP 164/86 | HR 76 | Ht 71.0 in | Wt 155.0 lb

## 2023-01-30 DIAGNOSIS — G8921 Chronic pain due to trauma: Secondary | ICD-10-CM | POA: Diagnosis not present

## 2023-01-30 DIAGNOSIS — G8221 Paraplegia, complete: Secondary | ICD-10-CM | POA: Diagnosis not present

## 2023-01-30 DIAGNOSIS — N319 Neuromuscular dysfunction of bladder, unspecified: Secondary | ICD-10-CM | POA: Diagnosis not present

## 2023-01-30 DIAGNOSIS — G8929 Other chronic pain: Secondary | ICD-10-CM | POA: Insufficient documentation

## 2023-01-30 DIAGNOSIS — Z5181 Encounter for therapeutic drug level monitoring: Secondary | ICD-10-CM | POA: Insufficient documentation

## 2023-01-30 DIAGNOSIS — L89213 Pressure ulcer of right hip, stage 3: Secondary | ICD-10-CM | POA: Diagnosis not present

## 2023-01-30 DIAGNOSIS — Z993 Dependence on wheelchair: Secondary | ICD-10-CM | POA: Diagnosis not present

## 2023-01-30 LAB — URINE CULTURE

## 2023-01-30 MED ORDER — BACLOFEN 10 MG PO TABS: 5.0000 mg | ORAL_TABLET | Freq: Three times a day (TID) | ORAL | 1 refills | Status: AC | PRN

## 2023-01-30 MED ORDER — TRAMADOL HCL 50 MG PO TABS: 50.0000 mg | ORAL_TABLET | Freq: Four times a day (QID) | ORAL | 5 refills | Status: DC | PRN

## 2023-01-30 MED ORDER — AMOXICILLIN-POT CLAVULANATE 875-125 MG PO TABS
1.0000 | ORAL_TABLET | Freq: Two times a day (BID) | ORAL | 0 refills | Status: DC
Start: 2023-01-30 — End: 2023-05-14
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
## 2023-01-30 NOTE — Addendum Note (Signed)
Addended by: Sydnee Cabal D on: 01/30/2023 10:22 AM   Modules accepted: Orders

## 2023-01-30 NOTE — Addendum Note (Signed)
Addended by: Bennie Pierini on: 01/30/2023 01:52 PM   Modules accepted: Orders

## 2023-02-02 ENCOUNTER — Encounter (HOSPITAL_COMMUNITY)
Admission: RE | Admit: 2023-02-02 | Discharge: 2023-02-02 | Disposition: A | Discharge status: Home / Self Care | Payer: Medicare HMO | Source: Ambulatory Visit | Attending: Internal Medicine | Admitting: Internal Medicine

## 2023-02-03 ENCOUNTER — Ambulatory Visit (HOSPITAL_COMMUNITY): Payer: Self-pay | Admitting: Certified Registered Nurse Anesthetist

## 2023-02-03 DIAGNOSIS — Z9181 History of falling: Secondary | ICD-10-CM | POA: Diagnosis not present

## 2023-02-03 DIAGNOSIS — Y92009 Unspecified place in unspecified non-institutional (private) residence as the place of occurrence of the external cause: Secondary | ICD-10-CM | POA: Diagnosis not present

## 2023-02-03 DIAGNOSIS — W19XXXS Unspecified fall, sequela: Secondary | ICD-10-CM | POA: Diagnosis not present

## 2023-02-03 DIAGNOSIS — M19072 Primary osteoarthritis, left ankle and foot: Secondary | ICD-10-CM | POA: Diagnosis not present

## 2023-02-03 DIAGNOSIS — L89523 Pressure ulcer of left ankle, stage 3: Secondary | ICD-10-CM | POA: Diagnosis not present

## 2023-02-03 DIAGNOSIS — W19XXXD Unspecified fall, subsequent encounter: Secondary | ICD-10-CM | POA: Diagnosis not present

## 2023-02-03 DIAGNOSIS — L89214 Pressure ulcer of right hip, stage 4: Secondary | ICD-10-CM | POA: Diagnosis not present

## 2023-02-03 DIAGNOSIS — M79672 Pain in left foot: Secondary | ICD-10-CM | POA: Diagnosis not present

## 2023-02-03 DIAGNOSIS — L89152 Pressure ulcer of sacral region, stage 2: Secondary | ICD-10-CM | POA: Diagnosis not present

## 2023-02-03 DIAGNOSIS — M25572 Pain in left ankle and joints of left foot: Secondary | ICD-10-CM | POA: Diagnosis not present

## 2023-02-03 LAB — DRUG TOX MONITOR 1 W/CONF, ORAL FLD
Amphetamines: NEGATIVE ng/mL (ref <10)
Barbiturates: NEGATIVE ng/mL (ref <10)
Benzodiazepines: NEGATIVE ng/mL (ref <0.50)
Buprenorphine: NEGATIVE ng/mL (ref <0.10)
Cocaine: NEGATIVE ng/mL (ref <5.0)
Cotinine: 88.1 ng/mL — ABNORMAL HIGH (ref <5.0)
Fentanyl: NEGATIVE ng/mL (ref <0.10)
Heroin Metabolite: NEGATIVE ng/mL (ref <1.0)
MARIJUANA: NEGATIVE ng/mL (ref <2.5)
MDMA: NEGATIVE ng/mL (ref <10)
Meprobamate: NEGATIVE ng/mL (ref <2.5)
Methadone: NEGATIVE ng/mL (ref <5.0)
Nicotine Metabolite: POSITIVE ng/mL — AB (ref <5.0)
Opiates: NEGATIVE ng/mL (ref <2.5)
Phencyclidine: NEGATIVE ng/mL (ref <10)
Tapentadol: NEGATIVE ng/mL (ref <5.0)
Tramadol: >500 ng/mL — ABNORMAL HIGH (ref <5.0)
Tramadol: POSITIVE ng/mL — AB (ref <5.0)
Zolpidem: NEGATIVE ng/mL (ref <5.0)

## 2023-02-03 LAB — DRUG TOX ALC METAB W/CON, ORAL FLD: Alcohol Metabolite: NEGATIVE ng/mL (ref <25)

## 2023-02-04 ENCOUNTER — Telehealth: Payer: Self-pay | Admitting: *Deleted

## 2023-02-04 ENCOUNTER — Ambulatory Visit (HOSPITAL_COMMUNITY): Payer: Self-pay | Admitting: Certified Registered Nurse Anesthetist

## 2023-02-04 ENCOUNTER — Encounter (HOSPITAL_COMMUNITY): Payer: Self-pay | Admitting: Internal Medicine

## 2023-02-04 ENCOUNTER — Encounter (HOSPITAL_COMMUNITY)
Admission: RE | Disposition: A | Discharge status: Home / Self Care | Payer: Self-pay | Source: Home / Self Care | Attending: Internal Medicine

## 2023-02-04 ENCOUNTER — Ambulatory Visit (HOSPITAL_COMMUNITY)
Admission: RE | Admit: 2023-02-04 | Discharge: 2023-02-04 | Disposition: A | Discharge status: Home / Self Care | Payer: Medicare HMO | Attending: Internal Medicine | Admitting: Internal Medicine

## 2023-02-04 SURGERY — CANCELLED PROCEDURE
Anesthesia: Monitor Anesthesia Care

## 2023-02-04 MED ORDER — LACTATED RINGERS IV SOLN: INTRAVENOUS | Status: DC

## 2023-02-04 MED ORDER — PROPOFOL 500 MG/50ML IV EMUL
INTRAVENOUS | Status: AC
  Filled 2023-02-04: qty 50

## 2023-02-04 MED ORDER — LIDOCAINE HCL (PF) 2 % IJ SOLN
INTRAMUSCULAR | Status: AC
  Filled 2023-02-04: qty 5

## 2023-02-04 NOTE — Telephone Encounter (Signed)
Per Dr. Jena Gauss "He only had 1 tiny polyp removed from his hepatic flexure in 2019.  The good news for him now is he can actually wait 2 more years before he has to come back.  Lets just let him know that and put him on a call back first 2 years from now which will be 7 years from his last colonoscopy."   Called # on file and it is for [employer], not able to leave message. Darl Pikes, please NIC for TCS in 2 years. Thanks!

## 2023-02-04 NOTE — Anesthesia Preprocedure Evaluation (Signed)
Anesthesia Evaluation  Patient identified by MRN, date of birth, ID band Patient awake    Reviewed: Allergy & Precautions, H&P , NPO status , Patient's Chart, lab work & pertinent test results, reviewed documented beta blocker date and time   History of Anesthesia Complications (+) PONV and history of anesthetic complications  Airway Mallampati: II  TM Distance: >3 FB Neck ROM: full    Dental no notable dental hx.    Pulmonary sleep apnea , former smoker   Pulmonary exam normal breath sounds clear to auscultation       Cardiovascular Exercise Tolerance: Good hypertension, + Peripheral Vascular Disease   Rhythm:regular Rate:Normal     Neuro/Psych  Neuromuscular disease  negative psych ROS   GI/Hepatic Neg liver ROS, hiatal hernia,GERD  ,,  Endo/Other  negative endocrine ROS    Renal/GU negative Renal ROS  negative genitourinary   Musculoskeletal   Abdominal   Peds  Hematology negative hematology ROS (+)   Anesthesia Other Findings   Reproductive/Obstetrics negative OB ROS                             Anesthesia Physical Anesthesia Plan  ASA: 3  Anesthesia Plan: General   Post-op Pain Management:    Induction:   PONV Risk Score and Plan: Propofol infusion  Airway Management Planned:   Additional Equipment:   Intra-op Plan:   Post-operative Plan:   Informed Consent: I have reviewed the patients History and Physical, chart, labs and discussed the procedure including the risks, benefits and alternatives for the proposed anesthesia with the patient or authorized representative who has indicated his/her understanding and acceptance.     Dental Advisory Given  Plan Discussed with: CRNA  Anesthesia Plan Comments:        Anesthesia Quick Evaluation
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
## 2023-02-05 NOTE — Telephone Encounter (Signed)
Called # on file and went straight to VM, not able to leave message

## 2023-02-06 NOTE — Telephone Encounter (Signed)
Called pt and made him aware. He voiced understanding.

## 2023-02-24 DIAGNOSIS — Z7902 Long term (current) use of antithrombotics/antiplatelets: Secondary | ICD-10-CM | POA: Diagnosis not present

## 2023-02-24 DIAGNOSIS — K219 Gastro-esophageal reflux disease without esophagitis: Secondary | ICD-10-CM | POA: Diagnosis not present

## 2023-02-24 DIAGNOSIS — I739 Peripheral vascular disease, unspecified: Secondary | ICD-10-CM | POA: Diagnosis not present

## 2023-02-24 DIAGNOSIS — Z792 Long term (current) use of antibiotics: Secondary | ICD-10-CM | POA: Diagnosis not present

## 2023-02-24 DIAGNOSIS — L89214 Pressure ulcer of right hip, stage 4: Secondary | ICD-10-CM | POA: Diagnosis not present

## 2023-02-24 DIAGNOSIS — G8929 Other chronic pain: Secondary | ICD-10-CM | POA: Diagnosis not present

## 2023-02-24 DIAGNOSIS — L89523 Pressure ulcer of left ankle, stage 3: Secondary | ICD-10-CM | POA: Diagnosis not present

## 2023-02-24 DIAGNOSIS — I10 Essential (primary) hypertension: Secondary | ICD-10-CM | POA: Diagnosis not present

## 2023-02-24 DIAGNOSIS — E7800 Pure hypercholesterolemia, unspecified: Secondary | ICD-10-CM | POA: Diagnosis not present

## 2023-02-24 DIAGNOSIS — G8220 Paraplegia, unspecified: Secondary | ICD-10-CM | POA: Diagnosis not present

## 2023-02-25 ENCOUNTER — Ambulatory Visit: Payer: Medicare HMO

## 2023-03-10 ENCOUNTER — Other Ambulatory Visit: Payer: Self-pay

## 2023-03-10 ENCOUNTER — Ambulatory Visit (HOSPITAL_COMMUNITY): Payer: Medicare HMO | Attending: Physical Medicine and Rehabilitation

## 2023-03-10 DIAGNOSIS — M6281 Muscle weakness (generalized): Secondary | ICD-10-CM | POA: Diagnosis not present

## 2023-03-10 DIAGNOSIS — R2689 Other abnormalities of gait and mobility: Secondary | ICD-10-CM | POA: Diagnosis not present

## 2023-03-10 DIAGNOSIS — G8221 Paraplegia, complete: Secondary | ICD-10-CM | POA: Insufficient documentation

## 2023-03-10 DIAGNOSIS — M25511 Pain in right shoulder: Secondary | ICD-10-CM | POA: Insufficient documentation

## 2023-03-10 DIAGNOSIS — M25512 Pain in left shoulder: Secondary | ICD-10-CM | POA: Diagnosis not present
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
## 2023-03-17 ENCOUNTER — Ambulatory Visit (HOSPITAL_COMMUNITY): Payer: Medicare HMO

## 2023-03-17 DIAGNOSIS — R2689 Other abnormalities of gait and mobility: Secondary | ICD-10-CM | POA: Diagnosis not present

## 2023-03-17 DIAGNOSIS — M6281 Muscle weakness (generalized): Secondary | ICD-10-CM

## 2023-03-17 DIAGNOSIS — M25511 Pain in right shoulder: Secondary | ICD-10-CM

## 2023-03-17 DIAGNOSIS — M25512 Pain in left shoulder: Secondary | ICD-10-CM | POA: Diagnosis not present

## 2023-03-17 DIAGNOSIS — G8221 Paraplegia, complete: Secondary | ICD-10-CM | POA: Diagnosis not present
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
## 2023-03-24 ENCOUNTER — Ambulatory Visit (HOSPITAL_COMMUNITY): Payer: Medicare HMO | Attending: Physical Medicine and Rehabilitation

## 2023-03-24 DIAGNOSIS — M25512 Pain in left shoulder: Secondary | ICD-10-CM | POA: Diagnosis not present

## 2023-03-24 DIAGNOSIS — R2689 Other abnormalities of gait and mobility: Secondary | ICD-10-CM | POA: Insufficient documentation

## 2023-03-24 DIAGNOSIS — M25511 Pain in right shoulder: Secondary | ICD-10-CM | POA: Diagnosis not present

## 2023-03-24 DIAGNOSIS — M6281 Muscle weakness (generalized): Secondary | ICD-10-CM | POA: Diagnosis not present
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
## 2023-03-31 ENCOUNTER — Encounter (HOSPITAL_COMMUNITY): Payer: Medicare HMO

## 2023-04-08 ENCOUNTER — Encounter (HOSPITAL_COMMUNITY): Payer: Medicare HMO

## 2023-04-14 DIAGNOSIS — N3020 Other chronic cystitis without hematuria: Secondary | ICD-10-CM | POA: Diagnosis not present

## 2023-04-14 DIAGNOSIS — L89512 Pressure ulcer of right ankle, stage 2: Secondary | ICD-10-CM | POA: Diagnosis not present

## 2023-04-14 DIAGNOSIS — N312 Flaccid neuropathic bladder, not elsewhere classified: Secondary | ICD-10-CM | POA: Diagnosis not present

## 2023-04-14 DIAGNOSIS — R972 Elevated prostate specific antigen [PSA]: Secondary | ICD-10-CM | POA: Diagnosis not present

## 2023-04-14 DIAGNOSIS — L89523 Pressure ulcer of left ankle, stage 3: Secondary | ICD-10-CM | POA: Diagnosis not present

## 2023-04-15 ENCOUNTER — Encounter (HOSPITAL_COMMUNITY): Payer: Self-pay

## 2023-04-15 ENCOUNTER — Ambulatory Visit (HOSPITAL_COMMUNITY): Payer: Medicare HMO

## 2023-04-15 DIAGNOSIS — M25511 Pain in right shoulder: Secondary | ICD-10-CM | POA: Diagnosis not present

## 2023-04-15 DIAGNOSIS — M6281 Muscle weakness (generalized): Secondary | ICD-10-CM | POA: Diagnosis not present

## 2023-04-15 DIAGNOSIS — R2689 Other abnormalities of gait and mobility: Secondary | ICD-10-CM

## 2023-04-15 DIAGNOSIS — M25512 Pain in left shoulder: Secondary | ICD-10-CM
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
## 2023-04-17 DIAGNOSIS — S24103A Unspecified injury at T7-T10 level of thoracic spinal cord, initial encounter: Secondary | ICD-10-CM | POA: Diagnosis not present

## 2023-04-17 DIAGNOSIS — G8220 Paraplegia, unspecified: Secondary | ICD-10-CM | POA: Diagnosis not present

## 2023-04-17 DIAGNOSIS — L89153 Pressure ulcer of sacral region, stage 3: Secondary | ICD-10-CM | POA: Diagnosis not present

## 2023-04-21 ENCOUNTER — Ambulatory Visit (HOSPITAL_COMMUNITY): Attending: Physical Medicine and Rehabilitation

## 2023-04-21 DIAGNOSIS — M25512 Pain in left shoulder: Secondary | ICD-10-CM | POA: Diagnosis not present

## 2023-04-21 DIAGNOSIS — M25511 Pain in right shoulder: Secondary | ICD-10-CM | POA: Diagnosis not present

## 2023-04-21 DIAGNOSIS — R2689 Other abnormalities of gait and mobility: Secondary | ICD-10-CM | POA: Diagnosis not present

## 2023-04-21 DIAGNOSIS — M6281 Muscle weakness (generalized): Secondary | ICD-10-CM | POA: Diagnosis not present
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
## 2023-04-28 DIAGNOSIS — L89523 Pressure ulcer of left ankle, stage 3: Secondary | ICD-10-CM | POA: Diagnosis not present

## 2023-04-28 DIAGNOSIS — L89214 Pressure ulcer of right hip, stage 4: Secondary | ICD-10-CM | POA: Diagnosis not present

## 2023-05-01 ENCOUNTER — Encounter: Payer: Medicare HMO | Admitting: Physical Medicine and Rehabilitation

## 2023-05-05 DIAGNOSIS — N401 Enlarged prostate with lower urinary tract symptoms: Secondary | ICD-10-CM | POA: Diagnosis not present

## 2023-05-06 DIAGNOSIS — E782 Mixed hyperlipidemia: Secondary | ICD-10-CM | POA: Diagnosis not present

## 2023-05-06 DIAGNOSIS — N1831 Chronic kidney disease, stage 3a: Secondary | ICD-10-CM | POA: Diagnosis not present

## 2023-05-06 DIAGNOSIS — R739 Hyperglycemia, unspecified: Secondary | ICD-10-CM | POA: Diagnosis not present

## 2023-05-06 DIAGNOSIS — D649 Anemia, unspecified: Secondary | ICD-10-CM | POA: Diagnosis not present

## 2023-05-06 DIAGNOSIS — R779 Abnormality of plasma protein, unspecified: Secondary | ICD-10-CM | POA: Diagnosis not present

## 2023-05-12 DIAGNOSIS — L89214 Pressure ulcer of right hip, stage 4: Secondary | ICD-10-CM | POA: Diagnosis not present

## 2023-05-12 DIAGNOSIS — L89523 Pressure ulcer of left ankle, stage 3: Secondary | ICD-10-CM | POA: Diagnosis not present
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
## 2023-05-14 ENCOUNTER — Ambulatory Visit: Payer: Medicare HMO | Admitting: Physician Assistant

## 2023-05-14 ENCOUNTER — Encounter: Payer: Self-pay | Admitting: Vascular Surgery

## 2023-05-14 ENCOUNTER — Ambulatory Visit (INDEPENDENT_AMBULATORY_CARE_PROVIDER_SITE_OTHER)
Admission: RE | Admit: 2023-05-14 | Discharge: 2023-05-14 | Discharge status: Home / Self Care | Payer: Medicare HMO | Source: Ambulatory Visit | Attending: Vascular Surgery

## 2023-05-14 ENCOUNTER — Other Ambulatory Visit: Payer: Self-pay

## 2023-05-14 ENCOUNTER — Ambulatory Visit (HOSPITAL_COMMUNITY)
Admission: RE | Admit: 2023-05-14 | Discharge: 2023-05-14 | Disposition: A | Discharge status: Home / Self Care | Payer: Medicare HMO | Source: Ambulatory Visit | Attending: Vascular Surgery | Admitting: Vascular Surgery

## 2023-05-14 VITALS — BP 174/92 | HR 86 | Temp 98.1°F | Ht 70.0 in | Wt 155.0 lb

## 2023-05-14 DIAGNOSIS — I739 Peripheral vascular disease, unspecified: Secondary | ICD-10-CM

## 2023-05-14 DIAGNOSIS — I70299 Other atherosclerosis of native arteries of extremities, unspecified extremity: Secondary | ICD-10-CM

## 2023-05-14 DIAGNOSIS — L97909 Non-pressure chronic ulcer of unspecified part of unspecified lower leg with unspecified severity: Secondary | ICD-10-CM | POA: Diagnosis not present

## 2023-05-14 LAB — VAS US ABI WITH/WO TBI
Left ABI: 0.73
Right ABI: 0.88

## 2023-05-15 ENCOUNTER — Encounter: Payer: Self-pay | Admitting: Vascular Surgery

## 2023-05-18 ENCOUNTER — Ambulatory Visit
Admission: RE | Admit: 2023-05-18 | Discharge: 2023-05-18 | Disposition: A | Discharge status: Home / Self Care | Source: Ambulatory Visit | Attending: Vascular Surgery

## 2023-05-18 DIAGNOSIS — T82856A Stenosis of peripheral vascular stent, initial encounter: Secondary | ICD-10-CM | POA: Diagnosis not present

## 2023-05-18 DIAGNOSIS — L97909 Non-pressure chronic ulcer of unspecified part of unspecified lower leg with unspecified severity: Secondary | ICD-10-CM

## 2023-05-18 DIAGNOSIS — I708 Atherosclerosis of other arteries: Secondary | ICD-10-CM | POA: Diagnosis not present

## 2023-05-18 MED ORDER — IOPAMIDOL (ISOVUE-370) INJECTION 76%
100.0000 mL | Freq: Once | INTRAVENOUS | Status: AC | PRN
  Administered 2023-05-18: 100 mL via INTRAVENOUS
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
## 2023-05-19 ENCOUNTER — Encounter: Payer: Self-pay | Admitting: Vascular Surgery

## 2023-05-19 ENCOUNTER — Telehealth: Payer: Self-pay

## 2023-05-19 ENCOUNTER — Ambulatory Visit: Admitting: Vascular Surgery

## 2023-05-19 VITALS — BP 178/87 | HR 74 | Temp 98.0°F | Resp 20 | Ht 70.0 in | Wt 155.0 lb

## 2023-05-19 DIAGNOSIS — I70223 Atherosclerosis of native arteries of extremities with rest pain, bilateral legs: Secondary | ICD-10-CM | POA: Diagnosis not present

## 2023-05-19 NOTE — Telephone Encounter (Signed)
 Attempted to call for surgery scheduling. LVM

## 2023-05-21 ENCOUNTER — Ambulatory Visit (HOSPITAL_COMMUNITY)
Admission: RE | Admit: 2023-05-21 | Discharge: 2023-05-21 | Disposition: A | Discharge status: Home / Self Care | Attending: Vascular Surgery | Admitting: Vascular Surgery

## 2023-05-21 ENCOUNTER — Encounter (HOSPITAL_COMMUNITY)
Admission: RE | Disposition: A | Discharge status: Home / Self Care | Payer: Self-pay | Source: Home / Self Care | Attending: Vascular Surgery

## 2023-05-21 ENCOUNTER — Other Ambulatory Visit: Payer: Self-pay

## 2023-05-21 ENCOUNTER — Encounter (HOSPITAL_COMMUNITY): Payer: Self-pay | Admitting: Vascular Surgery

## 2023-05-21 DIAGNOSIS — L97511 Non-pressure chronic ulcer of other part of right foot limited to breakdown of skin: Secondary | ICD-10-CM | POA: Diagnosis not present

## 2023-05-21 DIAGNOSIS — I70249 Atherosclerosis of native arteries of left leg with ulceration of unspecified site: Secondary | ICD-10-CM | POA: Diagnosis not present

## 2023-05-21 DIAGNOSIS — Z993 Dependence on wheelchair: Secondary | ICD-10-CM | POA: Insufficient documentation

## 2023-05-21 DIAGNOSIS — T82858A Stenosis of vascular prosthetic devices, implants and grafts, initial encounter: Secondary | ICD-10-CM

## 2023-05-21 DIAGNOSIS — I70245 Atherosclerosis of native arteries of left leg with ulceration of other part of foot: Secondary | ICD-10-CM | POA: Diagnosis present

## 2023-05-21 DIAGNOSIS — L97521 Non-pressure chronic ulcer of other part of left foot limited to breakdown of skin: Secondary | ICD-10-CM | POA: Diagnosis not present

## 2023-05-21 DIAGNOSIS — Z95828 Presence of other vascular implants and grafts: Secondary | ICD-10-CM | POA: Insufficient documentation

## 2023-05-21 DIAGNOSIS — I70235 Atherosclerosis of native arteries of right leg with ulceration of other part of foot: Secondary | ICD-10-CM | POA: Insufficient documentation

## 2023-05-21 DIAGNOSIS — Z87891 Personal history of nicotine dependence: Secondary | ICD-10-CM | POA: Diagnosis not present

## 2023-05-21 DIAGNOSIS — Z8249 Family history of ischemic heart disease and other diseases of the circulatory system: Secondary | ICD-10-CM | POA: Insufficient documentation

## 2023-05-21 DIAGNOSIS — I70223 Atherosclerosis of native arteries of extremities with rest pain, bilateral legs: Secondary | ICD-10-CM

## 2023-05-21 HISTORY — PX: LOWER EXTREMITY INTERVENTION: CATH118252

## 2023-05-21 HISTORY — PX: LOWER EXTREMITY ANGIOGRAPHY: CATH118251

## 2023-05-21 HISTORY — PX: ABDOMINAL AORTOGRAM W/LOWER EXTREMITY: CATH118223

## 2023-05-21 LAB — POCT I-STAT, CHEM 8
BUN: 15 mg/dL (ref 8–23)
Calcium, Ion: 1.16 mmol/L (ref 1.15–1.40)
Chloride: 107 mmol/L (ref 98–111)
Creatinine, Ser: 0.5 mg/dL — ABNORMAL LOW (ref 0.61–1.24)
Glucose, Bld: 103 mg/dL — ABNORMAL HIGH (ref 70–99)
HCT: 43 % (ref 39.0–52.0)
Hemoglobin: 14.6 g/dL (ref 13.0–17.0)
Potassium: 3.6 mmol/L (ref 3.5–5.1)
Sodium: 141 mmol/L (ref 135–145)
TCO2: 24 mmol/L (ref 22–32)

## 2023-05-21 SURGERY — ABDOMINAL AORTOGRAM W/LOWER EXTREMITY
Anesthesia: LOCAL | Laterality: Left

## 2023-05-21 MED ORDER — SODIUM CHLORIDE 0.9% FLUSH: 3.0000 mL | Freq: Two times a day (BID) | INTRAVENOUS | Status: DC

## 2023-05-21 MED ORDER — SODIUM CHLORIDE 0.9 % IV SOLN: INTRAVENOUS | Status: DC

## 2023-05-21 MED ORDER — CLOPIDOGREL BISULFATE 300 MG PO TABS
ORAL_TABLET | ORAL | Status: DC | PRN
  Administered 2023-05-21: 75 mg via ORAL

## 2023-05-21 MED ORDER — MIDAZOLAM HCL 2 MG/2ML IJ SOLN
INTRAMUSCULAR | Status: DC | PRN
  Administered 2023-05-21: 1 mg via INTRAVENOUS

## 2023-05-21 MED ORDER — HEPARIN SODIUM (PORCINE) 1000 UNIT/ML IJ SOLN
INTRAMUSCULAR | Status: AC
  Filled 2023-05-21: qty 10

## 2023-05-21 MED ORDER — SODIUM CHLORIDE 0.9 % IV SOLN: 250.0000 mL | INTRAVENOUS | Status: DC | PRN

## 2023-05-21 MED ORDER — HEPARIN SODIUM (PORCINE) 1000 UNIT/ML IJ SOLN
INTRAMUSCULAR | Status: DC | PRN
  Administered 2023-05-21: 7000 [IU] via INTRAVENOUS

## 2023-05-21 MED ORDER — CLOPIDOGREL BISULFATE 75 MG PO TABS
75.0000 mg | ORAL_TABLET | Freq: Every day | ORAL | Status: DC
Start: 2023-05-22 — End: 2023-05-21

## 2023-05-21 MED ORDER — MIDAZOLAM HCL 2 MG/2ML IJ SOLN
INTRAMUSCULAR | Status: AC
  Filled 2023-05-21: qty 2

## 2023-05-21 MED ORDER — HEPARIN (PORCINE) IN NACL 1000-0.9 UT/500ML-% IV SOLN
INTRAVENOUS | Status: DC | PRN
  Administered 2023-05-21 (×2): 500 mL

## 2023-05-21 MED ORDER — LIDOCAINE HCL (PF) 1 % IJ SOLN
INTRAMUSCULAR | Status: DC | PRN
  Administered 2023-05-21: 10 mL

## 2023-05-21 MED ORDER — FENTANYL CITRATE (PF) 100 MCG/2ML IJ SOLN
INTRAMUSCULAR | Status: DC | PRN
  Administered 2023-05-21: 25 ug via INTRAVENOUS

## 2023-05-21 MED ORDER — HYDRALAZINE HCL 20 MG/ML IJ SOLN: 5.0000 mg | INTRAMUSCULAR | Status: DC | PRN

## 2023-05-21 MED ORDER — LABETALOL HCL 5 MG/ML IV SOLN: 10.0000 mg | INTRAVENOUS | Status: DC | PRN

## 2023-05-21 MED ORDER — FENTANYL CITRATE (PF) 100 MCG/2ML IJ SOLN
INTRAMUSCULAR | Status: AC
Start: 2023-05-21 — End: ?
  Filled 2023-05-21: qty 2

## 2023-05-21 MED ORDER — LIDOCAINE HCL (PF) 1 % IJ SOLN
INTRAMUSCULAR | Status: AC
  Filled 2023-05-21: qty 30

## 2023-05-21 MED ORDER — IODIXANOL 320 MG/ML IV SOLN
INTRAVENOUS | Status: DC | PRN
  Administered 2023-05-21: 110 mL

## 2023-05-21 MED ORDER — SODIUM CHLORIDE 0.9% FLUSH: 3.0000 mL | INTRAVENOUS | Status: DC | PRN

## 2023-05-21 MED ORDER — OXYCODONE HCL 5 MG PO TABS: 5.0000 mg | ORAL_TABLET | ORAL | Status: DC | PRN

## 2023-05-21 MED ORDER — ACETAMINOPHEN 325 MG PO TABS: 650.0000 mg | ORAL_TABLET | ORAL | Status: DC | PRN

## 2023-05-21 MED ORDER — CLOPIDOGREL BISULFATE 75 MG PO TABS
ORAL_TABLET | ORAL | Status: AC
  Filled 2023-05-21: qty 1

## 2023-05-21 MED ORDER — ONDANSETRON HCL 4 MG/2ML IJ SOLN: 4.0000 mg | Freq: Four times a day (QID) | INTRAMUSCULAR | Status: DC | PRN

## 2023-05-21 SURGICAL SUPPLY — 20 items
BALLN STERLING OTW 5X150X150 (BALLOONS) ×2 IMPLANT
BALLOON STERLING OTW 5X150X150 (BALLOONS) IMPLANT
CATH OMNI FLUSH 5F 65CM (CATHETERS) IMPLANT
CATH QUICKCROSS SUPP .035X90CM (MICROCATHETER) IMPLANT
COVER DOME SNAP 22 D (MISCELLANEOUS) IMPLANT
DEVICE CLOSURE MYNXGRIP 6/7F (Vascular Products) IMPLANT
GLIDEWIRE ADV .035X260CM (WIRE) IMPLANT
KIT ENCORE 26 ADVANTAGE (KITS) IMPLANT
KIT MICROPUNCTURE NIT STIFF (SHEATH) IMPLANT
SET ATX-X65L (MISCELLANEOUS) IMPLANT
SHEATH CATAPULT 6F 45 MP (SHEATH) IMPLANT
SHEATH PINNACLE 5F 10CM (SHEATH) IMPLANT
SHEATH PINNACLE 6F 10CM (SHEATH) IMPLANT
SHEATH PROBE COVER 6X72 (BAG) IMPLANT
STENT ELUVIA 6X150X130 (Permanent Stent) IMPLANT
STENT ELUVIA 6X40X130 (Permanent Stent) IMPLANT
STENT VIABAHN 6X150X120 (Permanent Stent) IMPLANT
TRAY PV CATH (CUSTOM PROCEDURE TRAY) ×2 IMPLANT
WIRE BENTSON .035X145CM (WIRE) IMPLANT
WIRE G V18X300CM (WIRE) IMPLANT

## 2023-05-21 NOTE — Progress Notes (Signed)
 Patient was given discharge instructions. He verbalized understanding.
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
## 2023-05-21 NOTE — Op Note (Signed)
 Patient name: Dustin Medina MRN: [id number] DOB: March 12, 1950 Sex: male  05/21/2023 Pre-operative Diagnosis:  Critical limb ischemia of bilateral lower extremities with bilateral lower extremity tissue loss Occluded left SFA popliteal artery stents Post-operative diagnosis:  Same Surgeon:  Cephus Shelling, MD Procedure Performed: 1.  Ultrasound-guided access right common femoral artery 2.  Aortogram with catheter selection of aorta 3.  Left lower extremity arteriogram including catheter selection of the left popliteal artery 4.  Angioplasty and stent x 3 of occluded left SFA popliteal stents (6 mm x 15 cm Viabahn, 6 mm x 150 mm drug-coated Eluvia, and 6 mm x 40 mm drug-coated Eluvia all postdilated with a 5 mm Mustang) 5.  Right iliac artery pullback pressure 6.  Right lower extremity runoff from right femoral sheath 7.  Mynx closure of the right common femoral artery 8.  63 minutes monitored moderate conscious sedation time  Indications: 73 year old male seen with bilateral lower extremity tissue loss.  Urgent add-on to my office for evaluation of occluded left SFA popliteal stents previously placed by Dr. Edilia Bo with worsening tissue loss.  He presents today for lower extremity angiogram with a focus on the left leg after risks benefits discussed.  Also discussed runoff in the right leg given previous right leg intervention and right leg tissue loss.  Findings:   Ultrasound-guided access right common femoral artery.  Initial aortogram showed patent infrarenal aorta with patent left common iliac and patent right external iliac artery stent without flow limiting stenosis.  The right renal and SMA were visualized and patent.  On the left, the common femoral and profunda were patent.  The proximal SFA was diffusely diseased with multiple segments of flow-limiting stenosis over 80% in the native artery.  The previous left SFA above-knee popliteal stents were occluded with reconstitution  of the above-knee popliteal artery through a large collateral.  Distally three-vessel runoff.  Ultimately the occluded left leg stents were crossed antegrade from contralateral groin access.  I treated the occluded stents with a covered stent using a 6 mm x 15 cm Viabahn and then extended proximally into the native SFA that had not been stented in the past with several 6 mm drug-coated Eluvia's.  All this was dilated with a 5 mm Mustang.  Widely patent stents at completion with inline flow and preserved three-vessel runoff.  On the right I did get a pullback pressure of the iliac artery with no pressure gradient.  Right leg runoff shows patent common femoral and profunda, diseased SFA without flow-limiting stenosis and patent right SFA stents.  He does have three-vessel runoff distally.   Procedure:  The patient was identified in the holding area and taken to room 8.  The patient was then placed supine on the table and prepped and draped in the usual sterile fashion.  A time out was called.  Patient received Versed and fentanyl for conscious moderate sedation.  Vital signs were monitored including heart rate, respiratory rate, oxygenation and blood pressure.  I was present for all moderate sedation.  Ultrasound was used to evaluate the right common femoral artery.  It was patent .  A digital ultrasound image was acquired.  A micropuncture needle was used to access the right common femoral artery under ultrasound guidance.  An 018 wire was advanced without resistance and a micropuncture sheath was placed.  The 018 wire was removed and a benson wire was placed.  The micropuncture sheath was exchanged for a 5 french sheath.  An  omniflush catheter was advanced over the wire to the level of L-1.  An abdominal angiogram was obtained.  Next, using the omniflush catheter and a benson wire, the aortic bifurcation was crossed and the catheter was placed into theleft external iliac artery and left runoff was obtained.   Ultimately elected for intervention.  I then used a Glidewire advantage and upsized to a long 6 [place] catapult sheath in the right groin over the aortic bifurcation into the left external iliac artery.  Patient was given 100 units/kg IV heparin.  I then used a Glidewire advantage with a quick cross catheter and I was able to get into the left SFA through the diseased segment antegrade and across the occluded stents in the left SFA and into the popliteal artery below the knee.  I confirmed with hand-injection from the popliteal artery I was in the true lumen distally.  I elected to realign the occluded stents with a covered stent using a Viabahn and I placed distally a 6 mm x 15 cm Viabahn postdilated with a 5 mm balloon.  I then extended more proximally in the diseased native SFA with multiple 6 mm Eluvia's postdilated with a 5 mm Mustang as noted above.  Widely patent stents at completion with preserved three-vessel runoff.  I pulled my sheath back into the right iliac and did a pullback pressure with no pressure gradient.  I elected no iliac intervention.  We then put a short 6 French sheath in the right groin and got right leg runoff.  Pertinent findings are noted above.  Does not require right leg intervention at this time.  Mynx closure was deployed in the right groin.   Plan: Will arrange follow-up in 1 month with left leg arterial duplex and ABIs.  Optimized vascular surgery.  Plavix statin.  States he cannot tolerate aspirin.  Cephus Shelling, MD [facility] [place] Office: [phone]

## 2023-05-27 DIAGNOSIS — R779 Abnormality of plasma protein, unspecified: Secondary | ICD-10-CM | POA: Diagnosis not present

## 2023-05-27 DIAGNOSIS — D649 Anemia, unspecified: Secondary | ICD-10-CM | POA: Diagnosis not present

## 2023-05-27 DIAGNOSIS — N1831 Chronic kidney disease, stage 3a: Secondary | ICD-10-CM | POA: Diagnosis not present

## 2023-05-27 DIAGNOSIS — R768 Other specified abnormal immunological findings in serum: Secondary | ICD-10-CM | POA: Diagnosis not present

## 2023-05-29 DIAGNOSIS — L89214 Pressure ulcer of right hip, stage 4: Secondary | ICD-10-CM | POA: Diagnosis not present

## 2023-05-29 DIAGNOSIS — L89523 Pressure ulcer of left ankle, stage 3: Secondary | ICD-10-CM | POA: Diagnosis not present

## 2023-05-29 DIAGNOSIS — L89512 Pressure ulcer of right ankle, stage 2: Secondary | ICD-10-CM | POA: Diagnosis not present

## 2023-06-02 ENCOUNTER — Other Ambulatory Visit: Payer: Self-pay | Admitting: Nurse Practitioner

## 2023-06-02 DIAGNOSIS — G8220 Paraplegia, unspecified: Secondary | ICD-10-CM

## 2023-06-03 DIAGNOSIS — S24103A Unspecified injury at T7-T10 level of thoracic spinal cord, initial encounter: Secondary | ICD-10-CM | POA: Diagnosis not present

## 2023-06-03 DIAGNOSIS — G8220 Paraplegia, unspecified: Secondary | ICD-10-CM | POA: Diagnosis not present

## 2023-06-19 ENCOUNTER — Other Ambulatory Visit: Payer: Self-pay | Admitting: *Deleted

## 2023-06-19 DIAGNOSIS — L89523 Pressure ulcer of left ankle, stage 3: Secondary | ICD-10-CM | POA: Diagnosis not present

## 2023-06-19 DIAGNOSIS — I70223 Atherosclerosis of native arteries of extremities with rest pain, bilateral legs: Secondary | ICD-10-CM

## 2023-06-19 DIAGNOSIS — L89214 Pressure ulcer of right hip, stage 4: Secondary | ICD-10-CM | POA: Diagnosis not present

## 2023-06-19 DIAGNOSIS — I739 Peripheral vascular disease, unspecified: Secondary | ICD-10-CM

## 2023-06-25 ENCOUNTER — Ambulatory Visit (HOSPITAL_COMMUNITY)
Admission: RE | Admit: 2023-06-25 | Discharge: 2023-06-25 | Disposition: A | Discharge status: Home / Self Care | Source: Ambulatory Visit | Attending: Vascular Surgery | Admitting: Vascular Surgery

## 2023-06-25 DIAGNOSIS — I70223 Atherosclerosis of native arteries of extremities with rest pain, bilateral legs: Secondary | ICD-10-CM

## 2023-06-25 DIAGNOSIS — I739 Peripheral vascular disease, unspecified: Secondary | ICD-10-CM

## 2023-06-25 DIAGNOSIS — G40209 Localization-related (focal) (partial) symptomatic epilepsy and epileptic syndromes with complex partial seizures, not intractable, without status epilepticus: Secondary | ICD-10-CM | POA: Diagnosis not present

## 2023-06-26 LAB — VAS US ABI WITH/WO TBI
Left ABI: 1.03
Right ABI: 0.96

## 2023-06-30 ENCOUNTER — Encounter: Payer: Self-pay | Admitting: Vascular Surgery

## 2023-06-30 ENCOUNTER — Ambulatory Visit: Attending: Vascular Surgery | Admitting: Vascular Surgery

## 2023-06-30 VITALS — BP 164/101 | HR 78 | Temp 98.6°F | Resp 20 | Ht 71.0 in | Wt 150.0 lb

## 2023-06-30 DIAGNOSIS — I70223 Atherosclerosis of native arteries of extremities with rest pain, bilateral legs: Secondary | ICD-10-CM | POA: Diagnosis not present
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
## 2023-07-03 ENCOUNTER — Encounter: Attending: Physical Medicine and Rehabilitation | Admitting: Physical Medicine and Rehabilitation

## 2023-07-03 ENCOUNTER — Other Ambulatory Visit: Payer: Self-pay | Admitting: *Deleted

## 2023-07-03 DIAGNOSIS — L97909 Non-pressure chronic ulcer of unspecified part of unspecified lower leg with unspecified severity: Secondary | ICD-10-CM

## 2023-07-03 DIAGNOSIS — I739 Peripheral vascular disease, unspecified: Secondary | ICD-10-CM

## 2023-07-03 DIAGNOSIS — I70299 Other atherosclerosis of native arteries of extremities, unspecified extremity: Secondary | ICD-10-CM

## 2023-07-03 DIAGNOSIS — I70223 Atherosclerosis of native arteries of extremities with rest pain, bilateral legs: Secondary | ICD-10-CM

## 2023-07-17 DIAGNOSIS — L89523 Pressure ulcer of left ankle, stage 3: Secondary | ICD-10-CM | POA: Diagnosis not present

## 2023-07-17 DIAGNOSIS — L89512 Pressure ulcer of right ankle, stage 2: Secondary | ICD-10-CM | POA: Diagnosis not present

## 2023-07-17 DIAGNOSIS — L89214 Pressure ulcer of right hip, stage 4: Secondary | ICD-10-CM | POA: Diagnosis not present

## 2023-07-28 ENCOUNTER — Ambulatory Visit: Payer: Medicare HMO | Admitting: Nurse Practitioner

## 2023-07-28 NOTE — Progress Notes (Deleted)
 Subjective:    Patient ID: Dustin Medina, male    DOB: Jun 21, 1950, 73 y.o.   MRN: [id number]   Chief Complaint: medical management of chronic issues     HPI:  Dustin Medina is a 73 y.o. who identifies as a male who was assigned male at birth.   Social history: Lives with: by himself Work history: disability   Comes in today for follow up of the following chronic medical issues:  1. Essential hypertension No c/o chest pain, sob or headache. Does not check blood pressure at home BP Readings from Last 3 Encounters:  06/30/23 (!) 164/101  05/21/23 (!) 189/97  05/19/23 (!) 178/87     2. PVD (peripheral vascular disease) (HCC) Mainly in lower ext. His toes stay cold and sometimes discolorder.  3. Hyperlipidemia with target LDL less than 100 He does try to watch diet but does not do a lot of exercise. Lab Results  Component Value Date   CHOL 196 01/27/2023   HDL 42 01/27/2023   LDLCALC 131 (H) 01/27/2023   TRIG 125 01/27/2023   CHOLHDL 4.7 01/27/2023     4. Gastroesophageal reflux disease, unspecified whether esophagitis present Is on protonix  and is doing well.  5. Paraplegia (HCC) 6. Paraplegic spinal paralysis (HCC) 7. Spasticity 8. Stage III pressure ulcer of sacral region [facility]) Has had recent pressure ulcer on sacral area that are currently healed. He still has lesion on hip and ankle that he is seeing wound care for.  9. Wheelchair dependence He does not stand anymore. He spends most of his waking hours in his wheel chair.    New complaints: Still having UTI symptoms. Was treated with augmentin  and bactrim  at first of november  No Known Allergies Outpatient Encounter Medications as of 07/28/2023  Medication Sig   amLODipine  (NORVASC ) 10 MG tablet Take 1 tablet (10 mg total) by mouth daily. (NEEDS TO BE SEEN BEFORE NEXT REFILL)   atorvastatin  (LIPITOR) 40 MG tablet Take 1 tablet (40 mg total) by mouth daily.   baclofen  (LIORESAL ) 10 MG tablet Take  0.5-1 tablets (5-10 mg total) by mouth 3 (three) times daily as needed for muscle spasms.   clopidogrel  (PLAVIX ) 75 MG tablet Take 1 tablet (75 mg total) by mouth daily.   cyclobenzaprine  (FLEXERIL ) 10 MG tablet Take 10 mg by mouth 3 (three) times daily as needed for muscle spasms.   ibuprofen  (ADVIL ) 200 MG tablet Take 200 mg by mouth every 6 (six) hours as needed for headache or moderate pain.   losartan  (COZAAR ) 100 MG tablet Take 1 tablet (100 mg total) by mouth daily.   Nerve Stimulator (PRO COMFORT TENS ELECTRODES) MISC Apply 2 patches topically daily as needed.   niacin  (NIASPAN ) 1000 MG CR tablet Take 1 tablet (1,000 mg total) by mouth at bedtime. (NEEDS TO BE SEEN BEFORE NEXT REFILL)   pantoprazole  (PROTONIX ) 40 MG tablet Take 1 tablet (40 mg total) by mouth daily. (NEEDS TO BE SEEN BEFORE NEXT REFILL) (Patient taking differently: Take 40 mg by mouth daily as needed (acid reflux). (NEEDS TO BE SEEN BEFORE NEXT REFILL))   promethazine  (PHENERGAN ) 25 MG tablet Take 1 tablet (25 mg total) by mouth every 8 (eight) hours as needed for nausea or vomiting.   silver  sulfADIAZINE  (SILVADENE ) 1 % cream Apply 1 Application topically daily as needed (wound care).   tamsulosin (FLOMAX) 0.4 MG CAPS capsule Take 0.4 mg by mouth daily.   testosterone  cypionate (DEPOTESTOSTERONE CYPIONATE) 200 MG/ML injection INJECT  0.75 MLS (150 MG TOTAL) INTO THE MUSCLE EVERY 14 (FOURTEEN) DAYS.   traMADol  (ULTRAM ) 50 MG tablet Take 1 tablet (50 mg total) by mouth every 6 (six) hours as needed for severe pain (pain score 7-10).   traZODone  (DESYREL ) 50 MG tablet Take 1 tablet (50 mg total) by mouth at bedtime as needed for sleep.   vitamin C (ASCORBIC ACID) 500 MG tablet Take 500 mg by mouth daily.   zinc gluconate 50 MG tablet Take 50 mg by mouth daily.   No facility-administered encounter medications on file as of 07/28/2023.    Past Surgical History:  Procedure Laterality Date   ABDOMINAL AORTOGRAM W/LOWER  EXTREMITY Bilateral 11/19/2018   Procedure: ABDOMINAL AORTOGRAM W/LOWER EXTREMITY;  Surgeon: Dannis Dy, MD;  Location: [facility] INVASIVE CV LAB;  Service: Cardiovascular;  Laterality: Bilateral;   ABDOMINAL AORTOGRAM W/LOWER EXTREMITY Left 01/18/2021   Procedure: ABDOMINAL AORTOGRAM W/LOWER EXTREMITY;  Surgeon: Dannis Dy, MD;  Location: [facility] INVASIVE CV LAB;  Service: Cardiovascular;  Laterality: Left;   ABDOMINAL AORTOGRAM W/LOWER EXTREMITY N/A 08/04/2022   Procedure: ABDOMINAL AORTOGRAM W/LOWER EXTREMITY;  Surgeon: Dannis Dy, MD;  Location: [facility] INVASIVE CV LAB;  Service: Cardiovascular;  Laterality: N/A;   ABDOMINAL AORTOGRAM W/LOWER EXTREMITY Bilateral 05/21/2023   Procedure: ABDOMINAL AORTOGRAM W/LOWER EXTREMITY;  Surgeon: Young Hensen, MD;  Location: [facility] INVASIVE CV LAB;  Service: Cardiovascular;  Laterality: Bilateral;   BACK SURGERY  1969   carpal tunnel Right 11/23/13   coloncoscopy  2008   Dr. Homero Luster: few small diverticula at ascending colon and external hemorrhoids, otherwise normal   COLONOSCOPY WITH PROPOFOL  N/A 04/16/2017   Procedure: COLONOSCOPY WITH PROPOFOL ;  Surgeon: Suzette Espy, MD;  Location: [facility];  Service: Endoscopy;  Laterality: N/A;  11:15am   dilation of esophagus     ESOPHAGOGASTRODUODENOSCOPY N/A 07/26/2012   [id number] Schatzki's ring. Hiatal hernia, likely upper GI bleed secondary to MW tear   HERNIA REPAIR  02/03/2001   paraplegia and right inguinal hernia   left leg surgery due to staph infection  2005   LIPOMA EXCISION  07/07/2011   Procedure: EXCISION LIPOMA;  Surgeon: Kari Otto. Eli Grizzle, MD;  Location: [facility] ORS;  Service: General;  Laterality: Right;   LOWER EXTREMITY ANGIOGRAPHY N/A 07/16/2018   Procedure: LOWER EXTREMITY ANGIOGRAPHY;  Surgeon: Dannis Dy, MD;  Location: [facility] INVASIVE CV LAB;  Service: Cardiovascular;  Laterality: N/A;   LOWER EXTREMITY ANGIOGRAPHY Bilateral 05/21/2023   Procedure: Lower  Extremity Angiography;  Surgeon: Young Hensen, MD;  Location: [facility] INVAS[facility] CV LAB;  Service: Cardiovascular;  Laterality: Bilateral;   LOWER EXTREMITY INTERVENTION Left 05/21/2023   Procedure: LOWER EXTREMITY INTERVENTION;  Surgeon: Young Hensen, MD;  Location: [facility] INVASIVE CV LAB;  Service: Cardiovascular;  Laterality: Left;   MULTIPLE TOOTH EXTRACTIONS     PERIPHERAL VASCULAR INTERVENTION  07/16/2018   Procedure: PERIPHERAL VASCULAR INTERVENTION;  Surgeon: Dannis Dy, MD;  Location: [facility] INVASIVE CV LAB;  Service: Cardiovascular;;  right common iliac   PERIPHERAL VASCULAR INTERVENTION Left 01/18/2021   Procedure: PERIPHERAL VASCULAR INTERVENTION;  Surgeon: Dannis Dy, MD;  Location[facility] INVASIVE CV LAB;  Service: Cardiovascular;  Laterality: Left;  SFA / common iliac   PERIPHERAL VASCULAR INTERVENTION Left 08/04/2022   Procedure: PERIPHERAL VASCULAR INTERVENTION;  Surgeon: Dannis Dy, MD;  Location: [facility] INVASIVE CV LAB;  Service: Cardiovascular;  Laterality: Left;  Common Illiac   POLYPECTOMY  04/16/2017   Procedure: POLYPECTOMY;  Surgeon: Garnette Ka  M, MD;  Location: [facility];  Service: Endoscopy;;  colon   SURGERY FOR DECUBITUS ULCER      Family History  Problem Relation Age of Onset   COPD Father    Heart disease Father    Aneurysm Father    Hyperlipidemia Mother    Hypertension Mother    Diabetes Sister    Arrhythmia Daughter    Stroke Sister    Colon cancer Maternal Grandfather    Esophageal cancer Neg Hx       Controlled substance contract: n/a     Review of Systems  Constitutional:  Negative for diaphoresis.  Eyes:  Negative for pain.  Respiratory:  Negative for shortness of breath.   Cardiovascular:  Negative for chest pain, palpitations and leg swelling.  Gastrointestinal:  Negative for abdominal pain.  Endocrine: Negative for polydipsia.  Skin:  Negative for rash.  Neurological:  Negative for dizziness, weakness  and headaches.  Hematological:  Does not bruise/bleed easily.  All other systems reviewed and are negative.      Objective:   Physical Exam Vitals and nursing note reviewed.  Constitutional:      Appearance: Normal appearance. He is well-developed.  HENT:     Head: Normocephalic.     Nose: Nose normal.     Mouth/Throat:     Mouth: Mucous membranes are moist.     Pharynx: Oropharynx is clear.  Eyes:     Pupils: Pupils are equal, round, and reactive to light.  Neck:     Thyroid : No thyroid  mass or thyromegaly.     Vascular: No carotid bruit or JVD.     Trachea: Phonation normal.  Cardiovascular:     Rate and Rhythm: Normal rate and regular rhythm.  Pulmonary:     Effort: Pulmonary effort is normal. No respiratory distress.     Breath sounds: Normal breath sounds.  Abdominal:     General: Bowel sounds are normal.     Palpations: Abdomen is soft.     Tenderness: There is no abdominal tenderness.  Musculoskeletal:        General: Normal range of motion.     Cervical back: Normal range of motion and neck supple.     Left lower leg: Edema (1+) present.     Comments: Sitting in wheel chair Not able to move lower ext at all.  Lymphadenopathy:     Cervical: No cervical adenopathy.  Skin:    General: Skin is warm and dry.  Neurological:     Mental Status: He is alert and oriented to person, place, and time.  Psychiatric:        Behavior: Behavior normal.        Thought Content: Thought content normal.        Judgment: Judgment normal.      There were no vitals taken for this visit.      Assessment & Plan:   Dustin Medina comes in today with chief complaint of No chief complaint on file.   Diagnosis and orders addressed:  1. Essential hypertension Low sodium diet - amLODipine  (NORVASC ) 10 MG tablet; Take 1 tablet (10 mg total) by mouth daily. (NEEDS TO BE SEEN BEFORE NEXT REFILL)  Dispense: 90 tablet; Refill: 1 - losartan  (COZAAR ) 100 MG tablet; Take 1 tablet  (100 mg total) by mouth daily.  Dispense: 90 tablet; Refill: 1  2. PVD (peripheral vascular disease) (HCC) - clopidogrel  (PLAVIX ) 75 MG tablet; Take 1 tablet (75 mg total) by  mouth daily.  Dispense: 90 tablet; Refill: 1  3. Hyperlipidemia with target LDL less than 100 Low fat diet - atorvastatin  (LIPITOR) 40 MG tablet; Take 1 tablet (40 mg total) by mouth daily.  Dispense: 90 tablet; Refill: 1 - niacin  (NIASPAN ) 1000 MG CR tablet; Take 1 tablet (1,000 mg total) by mouth at bedtime. (NEEDS TO BE SEEN BEFORE NEXT REFILL)  Dispense: 90 tablet; Refill: 1  4. Gastroesophageal reflux disease, unspecified whether esophagitis present Avoid spicy foods Do not eat 2 hours prior to bedtime   5. Paraplegia (HCC) 6. Paraplegic spinal paralysis (HCC) 7. Spasticity 8. Stage III pressure ulcer of sacral region [facility]) Change positions frequently Keep follow up with wound care  9. Wheelchair dependence Elevate legs when can  10. Hx: UTI (urinary tract infection) Will run urine culture - Urinalysis, Complete - Urine Culture  11. Gastroesophageal reflux disease with esophagitis and hemorrhage Avoid spicy foods Do not eat 2 hours prior to bedtime - pantoprazole  (PROTONIX ) 40 MG tablet; Take 1 tablet (40 mg total) by mouth daily. (NEEDS TO BE SEEN BEFORE NEXT REFILL)  Dispense: 90 tablet; Refill: 1  12. Primary insomnia Bedtime routine - traZODone  (DESYREL ) 50 MG tablet; Take 1 tablet (50 mg total) by mouth at bedtime as needed for sleep.  Dispense: 90 tablet; Refill: 1   Labs pending Health Maintenance reviewed Diet and exercise encouraged  Follow up plan: 6 months   [name] Gaylyn Keas, FNP   Addendum: Acute cystits- bactrim  DS bid for 10 days- if culture comes back resistant will have to go to the hospital for treatment.  [name] Gaylyn Keas, FNP

## 2023-07-30 ENCOUNTER — Encounter: Payer: Self-pay | Admitting: Nurse Practitioner

## 2023-08-07 DIAGNOSIS — L89523 Pressure ulcer of left ankle, stage 3: Secondary | ICD-10-CM | POA: Diagnosis not present

## 2023-08-07 DIAGNOSIS — L89214 Pressure ulcer of right hip, stage 4: Secondary | ICD-10-CM | POA: Diagnosis not present

## 2023-08-28 DIAGNOSIS — L89214 Pressure ulcer of right hip, stage 4: Secondary | ICD-10-CM | POA: Diagnosis not present

## 2023-08-28 DIAGNOSIS — L89523 Pressure ulcer of left ankle, stage 3: Secondary | ICD-10-CM | POA: Diagnosis not present

## 2023-09-18 DIAGNOSIS — L89214 Pressure ulcer of right hip, stage 4: Secondary | ICD-10-CM | POA: Diagnosis not present

## 2023-09-18 DIAGNOSIS — L89523 Pressure ulcer of left ankle, stage 3: Secondary | ICD-10-CM | POA: Diagnosis not present

## 2023-10-02 DIAGNOSIS — L89214 Pressure ulcer of right hip, stage 4: Secondary | ICD-10-CM | POA: Diagnosis not present

## 2023-10-02 DIAGNOSIS — L89523 Pressure ulcer of left ankle, stage 3: Secondary | ICD-10-CM | POA: Diagnosis not present

## 2023-10-05 DIAGNOSIS — R972 Elevated prostate specific antigen [PSA]: Secondary | ICD-10-CM | POA: Diagnosis not present

## 2023-10-12 DIAGNOSIS — R972 Elevated prostate specific antigen [PSA]: Secondary | ICD-10-CM | POA: Diagnosis not present

## 2023-10-12 DIAGNOSIS — N312 Flaccid neuropathic bladder, not elsewhere classified: Secondary | ICD-10-CM | POA: Diagnosis not present

## 2023-10-20 ENCOUNTER — Other Ambulatory Visit: Payer: Self-pay | Admitting: Nurse Practitioner

## 2023-10-20 DIAGNOSIS — I10 Essential (primary) hypertension: Secondary | ICD-10-CM

## 2023-10-20 DIAGNOSIS — I739 Peripheral vascular disease, unspecified: Secondary | ICD-10-CM

## 2023-10-20 DIAGNOSIS — E785 Hyperlipidemia, unspecified: Secondary | ICD-10-CM

## 2023-10-20 DIAGNOSIS — F5101 Primary insomnia: Secondary | ICD-10-CM

## 2023-10-22 ENCOUNTER — Other Ambulatory Visit: Payer: Self-pay | Admitting: Nurse Practitioner

## 2023-10-22 DIAGNOSIS — I10 Essential (primary) hypertension: Secondary | ICD-10-CM

## 2023-10-30 ENCOUNTER — Other Ambulatory Visit: Payer: Self-pay | Admitting: Physical Medicine and Rehabilitation

## 2023-10-30 ENCOUNTER — Other Ambulatory Visit: Payer: Self-pay | Admitting: Nurse Practitioner

## 2023-10-30 DIAGNOSIS — E785 Hyperlipidemia, unspecified: Secondary | ICD-10-CM

## 2023-11-06 DIAGNOSIS — L89523 Pressure ulcer of left ankle, stage 3: Secondary | ICD-10-CM | POA: Diagnosis not present

## 2023-11-06 DIAGNOSIS — L89214 Pressure ulcer of right hip, stage 4: Secondary | ICD-10-CM | POA: Diagnosis not present

## 2023-11-21 ENCOUNTER — Other Ambulatory Visit: Payer: Self-pay | Admitting: Nurse Practitioner

## 2023-11-21 DIAGNOSIS — I10 Essential (primary) hypertension: Secondary | ICD-10-CM

## 2023-11-21 DIAGNOSIS — E785 Hyperlipidemia, unspecified: Secondary | ICD-10-CM

## 2023-11-22 ENCOUNTER — Other Ambulatory Visit: Payer: Self-pay | Admitting: Nurse Practitioner

## 2023-11-22 DIAGNOSIS — I10 Essential (primary) hypertension: Secondary | ICD-10-CM

## 2023-11-22 DIAGNOSIS — F5101 Primary insomnia: Secondary | ICD-10-CM

## 2023-11-23 ENCOUNTER — Encounter: Payer: Self-pay | Admitting: Nurse Practitioner

## 2023-11-23 NOTE — Telephone Encounter (Signed)
 Called to schedule appt no answer/vm full Letter mailed

## 2023-11-23 NOTE — Telephone Encounter (Signed)
 MMM pt NTBS 30-d given 10/30/23

## 2023-11-24 ENCOUNTER — Ambulatory Visit: Payer: Self-pay

## 2023-11-24 DIAGNOSIS — L89212 Pressure ulcer of right hip, stage 2: Secondary | ICD-10-CM | POA: Diagnosis not present

## 2023-11-24 DIAGNOSIS — L89522 Pressure ulcer of left ankle, stage 2: Secondary | ICD-10-CM | POA: Diagnosis not present

## 2023-11-24 DIAGNOSIS — L89523 Pressure ulcer of left ankle, stage 3: Secondary | ICD-10-CM | POA: Diagnosis not present

## 2023-11-24 DIAGNOSIS — L89214 Pressure ulcer of right hip, stage 4: Secondary | ICD-10-CM | POA: Diagnosis not present

## 2023-11-24 NOTE — Telephone Encounter (Signed)
 FYI Only or Action Required?: FYI only for provider.  Patient was last seen in primary care on 01/27/2023 by Gladis Mustard, FNP.  Called Nurse Triage reporting Breathing Problem.  Symptoms began yesterday.  Interventions attempted: Nothing.  Symptoms are: stable.  Triage Disposition: See Physician Within 24 Hours  Patient/caregiver understands and will follow disposition?: Yes      Copied from CRM #[id number]. Topic: Clinical - Red Word Triage >> Nov 24, 2023  1:31 PM Geneva B wrote: [facility] that prompted transfer to Nurse Triage:  pt having trouble breathing and blood in urine Reason for Disposition  Blood in urine  (Exception: Could be normal menstrual bleeding.)  Answer Assessment - Initial Assessment Questions 1. RESPIRATORY STATUS: Describe your breathing? (e.g., wheezing, shortness of breath, unable to speak, severe coughing)      Can't take a deep breath, pain in left shoulder 2. ONSET: When did this breathing problem begin?      Noticed today 3. PATTERN Does the difficult breathing come and go, or has it been constant since it started?      Only with deep breath 4. SEVERITY: How bad is your breathing? (e.g., mild, moderate, severe)      mild 5. RECURRENT SYMPTOM: Have you had difficulty breathing before? If Yes, ask: When was the last time? and What happened that time?      no  8. CAUSE: What do you think is causing the breathing problem?      Just when he needs to take a deep breath 9. OTHER SYMPTOMS: Do you have any other symptoms? (e.g., chest pain, cough, dizziness, fever, runny nose)     Pain in his back when he takes the deep breath, no other time.  Answer Assessment - Initial Assessment Questions 1. COLOR of URINE: Describe the color of the urine.  (e.g., tea-colored, pink, red, bloody) Do you have blood clots in your urine? (e.g., none, pea, grape, small coin)     Dark with some blood, not clear 2. ONSET: When did the bleeding  start?      Last night 3. EPISODES: How many times has there been blood in the urine? or How many times today?     Once last night 4. PAIN with URINATION: Is there any pain with passing your urine? If Yes, ask: How bad is the pain?  (Scale 1-10; or mild, moderate, severe)     no 5. FEVER: Do you have a fever? If Yes, ask: What is your temperature, how was it measured, and when did it start?     no 6. ASSOCIATED SYMPTOMS: Are you passing urine more frequently than usual?     no 7. OTHER SYMPTOMS: Do you have any other symptoms? (e.g., back/flank pain, abdomen pain, vomiting)     no  Protocols used: Breathing Difficulty-A-AH, Urine - Blood In-A-AH

## 2023-11-24 NOTE — Telephone Encounter (Signed)
 Patient scheduled to be seen tomorrow for these issues.

## 2023-11-25 ENCOUNTER — Ambulatory Visit: Admitting: Nurse Practitioner

## 2023-11-25 ENCOUNTER — Other Ambulatory Visit: Payer: Self-pay | Admitting: Family

## 2023-11-25 ENCOUNTER — Other Ambulatory Visit: Payer: Self-pay | Admitting: Nurse Practitioner

## 2023-11-25 ENCOUNTER — Encounter: Payer: Self-pay | Admitting: Nurse Practitioner

## 2023-11-25 VITALS — BP 131/75 | HR 109 | Temp 97.7°F

## 2023-11-25 DIAGNOSIS — I739 Peripheral vascular disease, unspecified: Secondary | ICD-10-CM

## 2023-11-25 DIAGNOSIS — N3001 Acute cystitis with hematuria: Secondary | ICD-10-CM

## 2023-11-25 DIAGNOSIS — R319 Hematuria, unspecified: Secondary | ICD-10-CM | POA: Diagnosis not present

## 2023-11-25 DIAGNOSIS — R0981 Nasal congestion: Secondary | ICD-10-CM | POA: Diagnosis not present

## 2023-11-25 DIAGNOSIS — E785 Hyperlipidemia, unspecified: Secondary | ICD-10-CM

## 2023-11-25 DIAGNOSIS — R32 Unspecified urinary incontinence: Secondary | ICD-10-CM

## 2023-11-25 DIAGNOSIS — G8220 Paraplegia, unspecified: Secondary | ICD-10-CM

## 2023-11-25 DIAGNOSIS — F5101 Primary insomnia: Secondary | ICD-10-CM

## 2023-11-25 DIAGNOSIS — I10 Essential (primary) hypertension: Secondary | ICD-10-CM

## 2023-11-25 LAB — URINALYSIS, COMPLETE
Bilirubin, UA: NEGATIVE
Glucose, UA: NEGATIVE
Ketones, UA: NEGATIVE
Nitrite, UA: POSITIVE — AB
Protein,UA: NEGATIVE
Specific Gravity, UA: 1.015 (ref 1.005–1.030)
Urobilinogen, Ur: 0.2 mg/dL (ref 0.2–1.0)
pH, UA: 6 (ref 5.0–7.5)

## 2023-11-25 LAB — MICROSCOPIC EXAMINATION
Renal Epithel, UA: NONE SEEN /HPF
WBC, UA: >30 /HPF — AB (ref 0–5)
Yeast, UA: NONE SEEN

## 2023-11-25 MED ORDER — SULFAMETHOXAZOLE-TRIMETHOPRIM 800-160 MG PO TABS: 1.0000 | ORAL_TABLET | Freq: Two times a day (BID) | ORAL | 0 refills | Status: AC

## 2023-11-25 MED ORDER — AZELASTINE HCL 0.1 % NA SOLN: 1.0000 | Freq: Two times a day (BID) | NASAL | 12 refills | Status: AC
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
## 2023-11-26 ENCOUNTER — Ambulatory Visit: Payer: Self-pay | Admitting: Nurse Practitioner

## 2023-11-28 LAB — URINE CULTURE

## 2023-11-30 ENCOUNTER — Other Ambulatory Visit: Payer: Self-pay | Admitting: Physical Medicine and Rehabilitation

## 2023-11-30 ENCOUNTER — Ambulatory Visit: Payer: Self-pay

## 2023-11-30 NOTE — Telephone Encounter (Signed)
 FYI Only or Action Required?: FYI only for provider.  Patient was last seen in primary care on 11/25/2023 by Deitra Morton Sebastian Nena, NP.  Called Nurse Triage reporting Advice Only.  Symptoms began information only call.  Interventions attempted: Other: information only call.  Symptoms are: information only call.  Triage Disposition: Information or Advice Only Call  Patient/caregiver understands and will follow disposition?: Yes                             Copied from CRM [id number]. Topic: Clinical - Lab/Test Results >> Nov 30, 2023  1:23 PM Willma R wrote: Reason for CRM: Patient returning call for lab results. Warm transferred to NT. Reason for Disposition  Health information question, no triage required and triager able to answer question  Answer Assessment - Initial Assessment Questions 1. REASON FOR CALL: What is the main reason for your call? or How can I best help you?     Patient calling in to request lab results. This RN read result note, Urine culture positive for Klebsiella pneumoniae which is sensitive to the ATB prescribed during the encounter. Finish the course of ATB prescribed to patient. Patient has no additional questions and will complete antibiotic as prescribed. Patient agreed to call clinic back if symptoms fail to improve after antibiotic is completed.  Protocols used: Information Only Call - No Triage-A-AH

## 2023-11-30 NOTE — Telephone Encounter (Signed)
 Result encounter updated

## 2023-12-02 ENCOUNTER — Other Ambulatory Visit: Payer: Self-pay | Admitting: Physical Medicine and Rehabilitation

## 2023-12-14 ENCOUNTER — Ambulatory Visit (INDEPENDENT_AMBULATORY_CARE_PROVIDER_SITE_OTHER)

## 2023-12-14 VITALS — BP 131/75 | HR 109 | Ht 71.0 in | Wt 109.0 lb

## 2023-12-14 DIAGNOSIS — Z0000 Encounter for general adult medical examination without abnormal findings: Secondary | ICD-10-CM | POA: Diagnosis not present

## 2023-12-14 NOTE — Patient Instructions (Signed)
 Dustin Medina,  Thank you for taking the time for your Medicare Wellness Visit. I appreciate your continued commitment to your health goals. Please review the care plan we discussed, and feel free to reach out if I can assist you further.  Medicare recommends these wellness visits once per year to help you and your care team stay ahead of potential health issues. These visits are designed to focus on prevention, allowing your provider to concentrate on managing your acute and chronic conditions during your regular appointments.  Please note that Annual Wellness Visits do not include a physical exam. Some assessments may be limited, especially if the visit was conducted virtually. If needed, we may recommend a separate in-person follow-up with your provider.  Ongoing Care Seeing your primary care provider every 3 to 6 months helps us  monitor your health and provide consistent, personalized care.   Referrals If a referral was made during today's visit and you haven't received any updates within two weeks, please contact the referred provider directly to check on the status.  Recommended Screenings:  Health Maintenance  Topic Date Due   Zoster (Shingles) Vaccine (1 of 2) Never done   Pneumococcal Vaccine for age over 15 (1 of 1 - PCV) Never done   DEXA scan (bone density measurement)  07/07/2015   Flu Shot  09/18/2023   Medicare Annual Wellness Visit  10/10/2023   COVID-19 Vaccine (3 - 2025-26 season) 10/19/2023   Colon Cancer Screening  12/28/2023*   DTaP/Tdap/Td vaccine (3 - Td or Tdap) 04/22/2025   Hepatitis C Screening  Completed   Meningitis B Vaccine  Aged Out  *Topic was postponed. The date shown is not the original due date.       12/14/2023    9:35 AM  Advanced Directives  Does Patient Have a Medical Advance Directive? No   Advance Care Planning is important because it: Ensures you receive medical care that aligns with your values, goals, and preferences. Provides guidance  to your family and loved ones, reducing the emotional burden of decision-making during critical moments.  Vision: Annual vision screenings are recommended for early detection of glaucoma, cataracts, and diabetic retinopathy. These exams can also reveal signs of chronic conditions such as diabetes and high blood pressure.  Dental: Annual dental screenings help detect early signs of oral cancer, gum disease, and other conditions linked to overall health, including heart disease and diabetes.  Please see the attached documents for additional preventive care recommendations.
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
## 2023-12-18 ENCOUNTER — Encounter: Payer: Self-pay | Admitting: Physical Medicine and Rehabilitation

## 2023-12-18 ENCOUNTER — Encounter: Attending: Physical Medicine and Rehabilitation | Admitting: Physical Medicine and Rehabilitation

## 2023-12-18 VITALS — BP 149/79 | HR 74 | Ht 71.0 in | Wt 155.0 lb

## 2023-12-18 DIAGNOSIS — L89523 Pressure ulcer of left ankle, stage 3: Secondary | ICD-10-CM | POA: Insufficient documentation

## 2023-12-18 DIAGNOSIS — G8220 Paraplegia, unspecified: Secondary | ICD-10-CM | POA: Insufficient documentation

## 2023-12-18 DIAGNOSIS — Z5181 Encounter for therapeutic drug level monitoring: Secondary | ICD-10-CM | POA: Insufficient documentation

## 2023-12-18 DIAGNOSIS — G894 Chronic pain syndrome: Secondary | ICD-10-CM | POA: Diagnosis not present

## 2023-12-18 DIAGNOSIS — L89213 Pressure ulcer of right hip, stage 3: Secondary | ICD-10-CM | POA: Diagnosis not present

## 2023-12-18 DIAGNOSIS — Z79891 Long term (current) use of opiate analgesic: Secondary | ICD-10-CM | POA: Insufficient documentation

## 2023-12-18 DIAGNOSIS — G8929 Other chronic pain: Secondary | ICD-10-CM | POA: Insufficient documentation

## 2023-12-18 MED ORDER — TRAMADOL HCL 50 MG PO TABS: 50.0000 mg | ORAL_TABLET | Freq: Four times a day (QID) | ORAL | 5 refills | Status: AC | PRN
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
## 2023-12-18 NOTE — Addendum Note (Signed)
 Addended by: JAMA FLEMING T on: 12/18/2023 09:51 AM   Modules accepted: Orders
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
## 2023-12-22 LAB — DRUG TOX MONITOR 1 W/CONF, ORAL FLD
Amphetamines: NEGATIVE ng/mL (ref <10)
Barbiturates: NEGATIVE ng/mL (ref <10)
Benzodiazepines: NEGATIVE ng/mL (ref <0.50)
Buprenorphine: NEGATIVE ng/mL (ref <0.10)
Cocaine: NEGATIVE ng/mL (ref <5.0)
Cotinine: 174.8 ng/mL — ABNORMAL HIGH (ref <5.0)
Fentanyl: NEGATIVE ng/mL (ref <0.10)
Heroin Metabolite: NEGATIVE ng/mL (ref <1.0)
MARIJUANA: NEGATIVE ng/mL (ref <2.5)
MDMA: NEGATIVE ng/mL (ref <10)
Meprobamate: NEGATIVE ng/mL (ref <2.5)
Methadone: NEGATIVE ng/mL (ref <5.0)
Nicotine Metabolite: POSITIVE ng/mL — AB (ref <5.0)
Opiates: NEGATIVE ng/mL (ref <2.5)
Phencyclidine: NEGATIVE ng/mL (ref <10)
Tapentadol: NEGATIVE ng/mL (ref <5.0)
Tramadol: NEGATIVE ng/mL (ref <5.0)
Zolpidem: NEGATIVE ng/mL (ref <5.0)

## 2023-12-22 LAB — DRUG TOX ALC METAB W/CON, ORAL FLD: Alcohol Metabolite: NEGATIVE ng/mL (ref <25)

## 2023-12-26 ENCOUNTER — Emergency Department (HOSPITAL_COMMUNITY)

## 2023-12-26 ENCOUNTER — Emergency Department (HOSPITAL_COMMUNITY)
Admission: EM | Admit: 2023-12-26 | Discharge: 2023-12-26 | Disposition: A | Discharge status: Home / Self Care | Attending: Emergency Medicine | Admitting: Emergency Medicine

## 2023-12-26 ENCOUNTER — Other Ambulatory Visit: Payer: Self-pay

## 2023-12-26 ENCOUNTER — Encounter (HOSPITAL_COMMUNITY): Payer: Self-pay | Admitting: Emergency Medicine

## 2023-12-26 DIAGNOSIS — M25512 Pain in left shoulder: Secondary | ICD-10-CM | POA: Insufficient documentation

## 2023-12-26 DIAGNOSIS — M25422 Effusion, left elbow: Secondary | ICD-10-CM | POA: Diagnosis not present

## 2023-12-26 DIAGNOSIS — M19022 Primary osteoarthritis, left elbow: Secondary | ICD-10-CM | POA: Diagnosis not present

## 2023-12-26 DIAGNOSIS — M25522 Pain in left elbow: Secondary | ICD-10-CM | POA: Insufficient documentation

## 2023-12-26 DIAGNOSIS — Z7902 Long term (current) use of antithrombotics/antiplatelets: Secondary | ICD-10-CM | POA: Diagnosis not present

## 2023-12-26 MED ORDER — KETOROLAC TROMETHAMINE 15 MG/ML IJ SOLN
15.0000 mg | Freq: Once | INTRAMUSCULAR | Status: AC
  Administered 2023-12-26: 15 mg via INTRAMUSCULAR
  Filled 2023-12-26: qty 1

## 2023-12-26 NOTE — ED Notes (Signed)
 Patient spoke with his SO, she does not drive at night so she will be here later this morning to pick him up.
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
## 2023-12-26 NOTE — ED Triage Notes (Signed)
 BIB EMS from home with c/o pain in left elbow, shoulder and wrist. States his fingers feel swollen.
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
## 2023-12-29 ENCOUNTER — Ambulatory Visit (HOSPITAL_BASED_OUTPATIENT_CLINIC_OR_DEPARTMENT_OTHER)
Admission: RE | Admit: 2023-12-29 | Discharge: 2023-12-29 | Disposition: A | Discharge status: Home / Self Care | Source: Ambulatory Visit | Attending: Vascular Surgery | Admitting: Vascular Surgery

## 2023-12-29 ENCOUNTER — Ambulatory Visit (HOSPITAL_BASED_OUTPATIENT_CLINIC_OR_DEPARTMENT_OTHER)
Admission: RE | Admit: 2023-12-29 | Discharge: 2023-12-29 | Disposition: A | Source: Ambulatory Visit | Attending: Vascular Surgery

## 2023-12-29 ENCOUNTER — Ambulatory Visit: Admitting: Vascular Surgery

## 2023-12-29 ENCOUNTER — Encounter: Payer: Self-pay | Admitting: Vascular Surgery

## 2023-12-29 ENCOUNTER — Ambulatory Visit (HOSPITAL_COMMUNITY)
Admission: RE | Admit: 2023-12-29 | Discharge: 2023-12-29 | Disposition: A | Discharge status: Home / Self Care | Source: Ambulatory Visit | Attending: Vascular Surgery | Admitting: Vascular Surgery

## 2023-12-29 VITALS — BP 187/85 | HR 88 | Temp 98.2°F | Resp 20 | Ht 71.0 in | Wt 155.0 lb

## 2023-12-29 DIAGNOSIS — I70223 Atherosclerosis of native arteries of extremities with rest pain, bilateral legs: Secondary | ICD-10-CM | POA: Insufficient documentation

## 2023-12-29 DIAGNOSIS — L97909 Non-pressure chronic ulcer of unspecified part of unspecified lower leg with unspecified severity: Secondary | ICD-10-CM | POA: Insufficient documentation

## 2023-12-29 DIAGNOSIS — I70299 Other atherosclerosis of native arteries of extremities, unspecified extremity: Secondary | ICD-10-CM | POA: Diagnosis present

## 2023-12-29 DIAGNOSIS — I739 Peripheral vascular disease, unspecified: Secondary | ICD-10-CM | POA: Insufficient documentation

## 2023-12-29 LAB — VAS US ABI WITH/WO TBI
Left ABI: 0.97
Right ABI: 1

## 2023-12-30 ENCOUNTER — Encounter: Admitting: Physical Medicine and Rehabilitation

## 2023-12-31 ENCOUNTER — Other Ambulatory Visit: Payer: Self-pay | Admitting: *Deleted

## 2023-12-31 DIAGNOSIS — I70299 Other atherosclerosis of native arteries of extremities, unspecified extremity: Secondary | ICD-10-CM

## 2023-12-31 DIAGNOSIS — I739 Peripheral vascular disease, unspecified: Secondary | ICD-10-CM

## 2023-12-31 DIAGNOSIS — I70223 Atherosclerosis of native arteries of extremities with rest pain, bilateral legs: Secondary | ICD-10-CM

## 2024-01-01 DIAGNOSIS — R339 Retention of urine, unspecified: Secondary | ICD-10-CM | POA: Diagnosis not present

## 2024-01-05 DIAGNOSIS — L89214 Pressure ulcer of right hip, stage 4: Secondary | ICD-10-CM | POA: Diagnosis not present

## 2024-01-05 DIAGNOSIS — L89523 Pressure ulcer of left ankle, stage 3: Secondary | ICD-10-CM | POA: Diagnosis not present

## 2024-01-07 NOTE — Progress Notes (Signed)
 Dustin Medina                                          MRN: [id number]   01/07/2024   The [facility] Quality Team Specialist reviewed this patient medical record for the purposes of chart review for care gap closure. The following were reviewed: chart review for care gap closure-controlling blood pressure.    [facility] Quality Team

## 2024-01-11 DIAGNOSIS — G8220 Paraplegia, unspecified: Secondary | ICD-10-CM | POA: Diagnosis not present

## 2024-01-11 DIAGNOSIS — S24103A Unspecified injury at T7-T10 level of thoracic spinal cord, initial encounter: Secondary | ICD-10-CM | POA: Diagnosis not present

## 2024-01-11 DIAGNOSIS — L89153 Pressure ulcer of sacral region, stage 3: Secondary | ICD-10-CM | POA: Diagnosis not present

## 2024-01-27 ENCOUNTER — Other Ambulatory Visit: Payer: Self-pay | Admitting: Internal Medicine

## 2024-01-27 ENCOUNTER — Other Ambulatory Visit: Payer: Self-pay | Admitting: Nurse Practitioner

## 2024-01-27 ENCOUNTER — Other Ambulatory Visit: Payer: Self-pay | Admitting: Physical Medicine and Rehabilitation

## 2024-01-27 DIAGNOSIS — R509 Fever, unspecified: Secondary | ICD-10-CM

## 2024-01-27 DIAGNOSIS — K529 Noninfective gastroenteritis and colitis, unspecified: Secondary | ICD-10-CM

## 2024-01-27 DIAGNOSIS — N3001 Acute cystitis with hematuria: Secondary | ICD-10-CM

## 2024-01-27 DIAGNOSIS — R32 Unspecified urinary incontinence: Secondary | ICD-10-CM

## 2024-01-27 DIAGNOSIS — R319 Hematuria, unspecified: Secondary | ICD-10-CM

## 2024-02-09 DIAGNOSIS — L89214 Pressure ulcer of right hip, stage 4: Secondary | ICD-10-CM | POA: Diagnosis not present

## 2024-02-09 DIAGNOSIS — L89523 Pressure ulcer of left ankle, stage 3: Secondary | ICD-10-CM | POA: Diagnosis not present

## 2024-03-02 ENCOUNTER — Ambulatory Visit: Payer: Self-pay

## 2024-03-02 NOTE — Telephone Encounter (Signed)
 Appt made 1/15 with MMM. Pt aware.

## 2024-03-02 NOTE — Telephone Encounter (Signed)
 FYI Only or Action Required?: Action required by provider: update on patient condition and advised UC tonight- please call with alternate/additional recommendations.  Patient was last seen in primary care on 11/25/2023 by Deitra Morton Sebastian Nena, NP.  Called Nurse Triage reporting Shoulder Pain.  Symptoms began yesterday.  Interventions attempted: Prescription medications: tramadol .  Symptoms are: gradually worsening.  Triage Disposition: See Physician Within 24 Hours  Patient/caregiver understands and will follow disposition?: Yes   Copied from CRM [id number]. Topic: Clinical - Red Word Triage >> Mar 02, 2024  4:00 PM Cherylann RAMAN wrote: Red Word that prompted transfer to Nurse Triage: Patient called in with severe pain in his left shoulder. Denies any other pain or symptoms. Patient states that he thinks he may have slept on it incorrectly. In addition, patient states that he is unable to lift his left arm. Reason for Disposition  [1] Unable to use arm at all AND [2] because of shoulder pain or stiffness  Answer Assessment - Initial Assessment Questions Starting 2 nights ago- pain to the left shoulder. Denies radiating to the chest neck or down the arm. Unable to life arm in any direction above 8 inches in any direction due to pain. Patient is WC bound and having to use his elbow and sometimes can't use his arm to steer or wheel at all. Unable to roll himself over in bed   A month ago - left elbow froze Denies neck pain, paresthesias, unilateral weakness, dizziness, vision changes.  Pain management :Tramadol - helps minimally  Hx of PVD, denies swelling, color or temp change to the hand. Can move his hand and from the elbow down freely with no pain but unable to lift his shoulder.   Advised UC/ED for assessment tonight. With history of PVD and inability to lift arm, want urgent assessment by provider. Please call patient if any further recommendations.   1. ONSET: When did the pain  start?     Two nights ago  2. LOCATION: Where is the pain located?     Left shoulder  3. PAIN: How bad is the pain? (Scale 1-10; or mild, moderate, severe)     5/10 pain but last 11/10 pain last night- laying on back  4. WORK OR EXERCISE: Has there been any recent work or exercise that involved this part of the body?     Denies- possibly slept on it wrong  5. CAUSE: What do you think is causing the shoulder pain?     Frozen shoulder??  6. OTHER SYMPTOMS: Do you have any other symptoms? (e.g., neck pain, swelling, rash, fever, numbness, weakness)     denies  Protocols used: Shoulder Pain-A-AH

## 2024-03-03 ENCOUNTER — Ambulatory Visit (INDEPENDENT_AMBULATORY_CARE_PROVIDER_SITE_OTHER)

## 2024-03-03 ENCOUNTER — Encounter: Payer: Self-pay | Admitting: Nurse Practitioner

## 2024-03-03 ENCOUNTER — Ambulatory Visit (INDEPENDENT_AMBULATORY_CARE_PROVIDER_SITE_OTHER): Admitting: Nurse Practitioner

## 2024-03-03 ENCOUNTER — Ambulatory Visit: Payer: Self-pay | Admitting: Nurse Practitioner

## 2024-03-03 VITALS — BP 126/82 | HR 121 | Temp 96.8°F

## 2024-03-03 DIAGNOSIS — M25512 Pain in left shoulder: Secondary | ICD-10-CM

## 2024-03-03 NOTE — Progress Notes (Signed)
" ° °  Subjective:    Patient ID: Dustin Medina, male    DOB: 05/29/1950, 74 y.o.   MRN: [id number]   Chief Complaint: Shoulder Pain (Left shoulder. Hurting for weeks. No injury/)   Shoulder Pain     Patient in c/o left shoulder pain. Has been coming on gradually for months Denies injury. Is a paraplegic and is in wheel chair. He uses his upper body to move in and out of wheel chair. Rates pain  Patient Active Problem List   Diagnosis Date Noted   Enuresis 11/25/2023   Hematuria 11/25/2023   Nasal congestion 11/25/2023   Acute cystitis with hematuria 11/25/2023   Critical limb ischemia of both lower extremities (HCC) 05/19/2023   H/O adenomatous polyp of colon 11/14/2022   Carpal tunnel syndrome of left wrist 11/11/2021   Chronic pain syndrome 11/11/2021   Thrombocytosis 09/02/2021   Pressure ulcer of trochanteric region of right hip, stage 3 (HCC) 03/04/2021   Pressure ulcer of left ankle, stage 3 (HCC) 05/22/2020   Spasticity 03/30/2020   Wheelchair dependence 12/23/2019   Stage III pressure ulcer of sacral region (HCC) 11/11/2019   Pain of both shoulder joints 11/11/2019   PVD (peripheral vascular disease) 07/16/2018   De Quervain's tenosynovitis, right 03/03/2014   Right carpal tunnel syndrome 12/06/2013   T12 spinal cord injury (HCC) 12/06/2013   Neurogenic bladder 12/06/2013   Neurogenic bowel 12/06/2013   Chronic back pain 10/10/2013   Low testosterone  07/06/2013   Paraplegic spinal paralysis (HCC) 07/24/2012   Paraplegia (HCC) 07/24/2012   Peptic stricture of esophagus 06/27/2010   Hyperlipidemia with target LDL less than 100 04/09/2009   Essential hypertension 04/09/2009   GERD 04/09/2009       Review of Systems  Musculoskeletal:  Positive for arthralgias (right shoulder pain).       Objective:   Physical Exam Constitutional:      Appearance: Normal appearance.  Cardiovascular:     Rate and Rhythm: Normal rate and regular rhythm.     Heart sounds:  Normal heart sounds.  Pulmonary:     Breath sounds: Normal breath sounds.  Musculoskeletal:     Comments: Decrease ROM of left shoulder with pain on abduction and internal rotation.  Grips equal bil Moderate edema of left shoulder  Skin:    General: Skin is warm.  Neurological:     General: No focal deficit present.     Mental Status: He is alert and oriented to person, place, and time.    BP 126/82   Pulse (!) 121   Temp (!) 96.8 F (36 C) (Temporal)   SpO2 94%   Left sholder xray-bone on bone- radiology report pending       Assessment & Plan:  Dustin Medina in today with chief complaint of Shoulder Pain (Left shoulder. Hurting for weeks. No injury/)   1. Acute pain of left shoulder (Primary) Motrin  or tylenol  OTC for pain - DG Shoulder Left - Ambulatory referral to Orthopedic Surgery    The above assessment and management plan was discussed with the patient. The patient verbalized understanding of and has agreed to the management plan. Patient is aware to call the clinic if symptoms persist or worsen. Patient is aware when to return to the clinic for a follow-up visit. Patient educated on when it is appropriate to go to the emergency department.   Dustin Lunger, FNP    "

## 2024-03-18 ENCOUNTER — Encounter: Admitting: Physical Medicine and Rehabilitation

## 2024-03-22 ENCOUNTER — Ambulatory Visit: Admitting: Orthopedic Surgery

## 2024-03-25 ENCOUNTER — Encounter: Admitting: Physical Medicine and Rehabilitation

## 2024-04-05 ENCOUNTER — Ambulatory Visit: Admitting: Orthopedic Surgery

## 2024-04-08 ENCOUNTER — Encounter (HOSPITAL_BASED_OUTPATIENT_CLINIC_OR_DEPARTMENT_OTHER): Admitting: Internal Medicine

## 2024-05-06 ENCOUNTER — Encounter: Admitting: Physical Medicine and Rehabilitation

## 2024-06-28 ENCOUNTER — Ambulatory Visit: Admitting: Vascular Surgery

## 2024-06-28 ENCOUNTER — Ambulatory Visit (HOSPITAL_COMMUNITY)
